# Patient Record
Sex: Female | Born: 1965
Health system: Southern US, Community
[De-identification: ages and names within clinical notes are randomized; demographics above are authoritative.]

## PROBLEM LIST (undated history)

## (undated) DIAGNOSIS — R609 Edema, unspecified: Secondary | ICD-10-CM

## (undated) DIAGNOSIS — M7989 Other specified soft tissue disorders: Secondary | ICD-10-CM

## (undated) DIAGNOSIS — I776 Arteritis, unspecified: Secondary | ICD-10-CM

## (undated) DIAGNOSIS — M2550 Pain in unspecified joint: Secondary | ICD-10-CM

## (undated) DIAGNOSIS — D649 Anemia, unspecified: Secondary | ICD-10-CM

## (undated) DIAGNOSIS — D689 Coagulation defect, unspecified: Secondary | ICD-10-CM

## (undated) DIAGNOSIS — K5900 Constipation, unspecified: Secondary | ICD-10-CM

## (undated) DIAGNOSIS — K760 Fatty (change of) liver, not elsewhere classified: Secondary | ICD-10-CM

## (undated) DIAGNOSIS — M549 Dorsalgia, unspecified: Secondary | ICD-10-CM

## (undated) DIAGNOSIS — IMO0002 Reserved for concepts with insufficient information to code with codable children: Secondary | ICD-10-CM

## (undated) DIAGNOSIS — Z860100 Personal history of colon polyps, unspecified: Secondary | ICD-10-CM

## (undated) DIAGNOSIS — E669 Obesity, unspecified: Secondary | ICD-10-CM

## (undated) DIAGNOSIS — R0602 Shortness of breath: Secondary | ICD-10-CM

## (undated) DIAGNOSIS — R51 Headache: Secondary | ICD-10-CM

## (undated) DIAGNOSIS — I10 Essential (primary) hypertension: Secondary | ICD-10-CM

## (undated) DIAGNOSIS — K829 Disease of gallbladder, unspecified: Secondary | ICD-10-CM

## (undated) DIAGNOSIS — G459 Transient cerebral ischemic attack, unspecified: Secondary | ICD-10-CM

## (undated) DIAGNOSIS — I872 Venous insufficiency (chronic) (peripheral): Secondary | ICD-10-CM

## (undated) DIAGNOSIS — E559 Vitamin D deficiency, unspecified: Secondary | ICD-10-CM

## (undated) DIAGNOSIS — E119 Type 2 diabetes mellitus without complications: Secondary | ICD-10-CM

## (undated) DIAGNOSIS — M1990 Unspecified osteoarthritis, unspecified site: Secondary | ICD-10-CM

## (undated) HISTORY — DX: Personal history of colon polyps, unspecified: Z86.0100

## (undated) HISTORY — DX: Arteritis, unspecified: I77.6

## (undated) HISTORY — DX: Unspecified osteoarthritis, unspecified site: M19.90

## (undated) HISTORY — DX: Other specified soft tissue disorders: M79.89

## (undated) HISTORY — DX: Fatty (change of) liver, not elsewhere classified: K76.0

## (undated) HISTORY — DX: Disease of gallbladder, unspecified: K82.9

## (undated) HISTORY — DX: Obesity, unspecified: E66.9

## (undated) HISTORY — DX: Personal history of colonic polyps: Z86.010

## (undated) HISTORY — DX: Constipation, unspecified: K59.00

## (undated) HISTORY — PX: ABDOMINAL HYSTERECTOMY: SHX81

## (undated) HISTORY — DX: Edema, unspecified: R60.9

## (undated) HISTORY — DX: Pain in unspecified joint: M25.50

## (undated) HISTORY — DX: Headache: R51

## (undated) HISTORY — DX: Anemia, unspecified: D64.9

## (undated) HISTORY — DX: Shortness of breath: R06.02

## (undated) HISTORY — DX: Essential (primary) hypertension: I10

## (undated) HISTORY — DX: Transient cerebral ischemic attack, unspecified: G45.9

## (undated) HISTORY — DX: Dorsalgia, unspecified: M54.9

## (undated) HISTORY — DX: Vitamin D deficiency, unspecified: E55.9

## (undated) HISTORY — DX: Coagulation defect, unspecified: D68.9

---

## 1999-05-26 ENCOUNTER — Other Ambulatory Visit: Admission: RE | Admit: 1999-05-26 | Discharge: 1999-05-26 | Payer: Self-pay | Admitting: Obstetrics and Gynecology

## 2001-03-15 ENCOUNTER — Ambulatory Visit (HOSPITAL_COMMUNITY): Admission: RE | Admit: 2001-03-15 | Discharge: 2001-03-15 | Payer: Self-pay | Admitting: Obstetrics and Gynecology

## 2001-03-15 ENCOUNTER — Encounter: Payer: Self-pay | Admitting: Obstetrics and Gynecology

## 2001-05-13 ENCOUNTER — Ambulatory Visit (HOSPITAL_COMMUNITY): Admission: RE | Admit: 2001-05-13 | Discharge: 2001-05-13 | Payer: Self-pay | Admitting: Obstetrics and Gynecology

## 2001-05-13 ENCOUNTER — Encounter: Payer: Self-pay | Admitting: Obstetrics and Gynecology

## 2001-06-06 ENCOUNTER — Other Ambulatory Visit: Admission: RE | Admit: 2001-06-06 | Discharge: 2001-06-06 | Payer: Self-pay | Admitting: Obstetrics and Gynecology

## 2002-06-01 ENCOUNTER — Ambulatory Visit (HOSPITAL_COMMUNITY): Admission: RE | Admit: 2002-06-01 | Discharge: 2002-06-01 | Payer: Self-pay | Admitting: *Deleted

## 2002-06-12 ENCOUNTER — Other Ambulatory Visit: Admission: RE | Admit: 2002-06-12 | Discharge: 2002-06-12 | Payer: Self-pay | Admitting: Obstetrics and Gynecology

## 2002-09-01 ENCOUNTER — Encounter (INDEPENDENT_AMBULATORY_CARE_PROVIDER_SITE_OTHER): Payer: Self-pay

## 2002-09-01 ENCOUNTER — Ambulatory Visit (HOSPITAL_COMMUNITY): Admission: RE | Admit: 2002-09-01 | Discharge: 2002-09-01 | Payer: Self-pay | Admitting: Obstetrics and Gynecology

## 2003-05-28 ENCOUNTER — Encounter (INDEPENDENT_AMBULATORY_CARE_PROVIDER_SITE_OTHER): Payer: Self-pay | Admitting: Specialist

## 2003-05-28 ENCOUNTER — Observation Stay (HOSPITAL_COMMUNITY): Admission: RE | Admit: 2003-05-28 | Discharge: 2003-05-29 | Payer: Self-pay | Admitting: Obstetrics and Gynecology

## 2003-07-22 ENCOUNTER — Emergency Department (HOSPITAL_COMMUNITY): Admission: EM | Admit: 2003-07-22 | Discharge: 2003-07-22 | Payer: Self-pay

## 2003-09-17 ENCOUNTER — Other Ambulatory Visit: Admission: RE | Admit: 2003-09-17 | Discharge: 2003-09-17 | Payer: Self-pay | Admitting: Obstetrics and Gynecology

## 2004-10-17 ENCOUNTER — Other Ambulatory Visit: Admission: RE | Admit: 2004-10-17 | Discharge: 2004-10-17 | Payer: Self-pay | Admitting: Obstetrics and Gynecology

## 2005-04-13 DIAGNOSIS — G459 Transient cerebral ischemic attack, unspecified: Secondary | ICD-10-CM

## 2005-04-13 HISTORY — DX: Transient cerebral ischemic attack, unspecified: G45.9

## 2005-04-20 ENCOUNTER — Ambulatory Visit (HOSPITAL_COMMUNITY): Admission: RE | Admit: 2005-04-20 | Discharge: 2005-04-20 | Payer: Self-pay | Admitting: Oncology

## 2006-05-31 ENCOUNTER — Emergency Department (HOSPITAL_COMMUNITY): Admission: EM | Admit: 2006-05-31 | Discharge: 2006-05-31 | Payer: Self-pay | Admitting: Family Medicine

## 2006-11-02 ENCOUNTER — Ambulatory Visit: Payer: Self-pay | Admitting: Internal Medicine

## 2006-11-02 ENCOUNTER — Inpatient Hospital Stay (HOSPITAL_COMMUNITY): Admission: EM | Admit: 2006-11-02 | Discharge: 2006-11-03 | Payer: Self-pay | Admitting: Emergency Medicine

## 2006-11-03 ENCOUNTER — Encounter (INDEPENDENT_AMBULATORY_CARE_PROVIDER_SITE_OTHER): Payer: Self-pay | Admitting: Pediatrics

## 2007-05-25 ENCOUNTER — Inpatient Hospital Stay (HOSPITAL_COMMUNITY): Admission: EM | Admit: 2007-05-25 | Discharge: 2007-05-27 | Payer: Self-pay | Admitting: Emergency Medicine

## 2007-05-26 ENCOUNTER — Encounter (INDEPENDENT_AMBULATORY_CARE_PROVIDER_SITE_OTHER): Payer: Self-pay | Admitting: Neurology

## 2007-05-27 ENCOUNTER — Encounter (INDEPENDENT_AMBULATORY_CARE_PROVIDER_SITE_OTHER): Payer: Self-pay | Admitting: Cardiology

## 2008-01-20 ENCOUNTER — Emergency Department (HOSPITAL_COMMUNITY): Admission: EM | Admit: 2008-01-20 | Discharge: 2008-01-20 | Payer: Self-pay | Admitting: Emergency Medicine

## 2008-06-26 ENCOUNTER — Encounter (INDEPENDENT_AMBULATORY_CARE_PROVIDER_SITE_OTHER): Payer: Self-pay | Admitting: Emergency Medicine

## 2008-06-26 ENCOUNTER — Ambulatory Visit: Payer: Self-pay | Admitting: Surgery

## 2008-06-26 ENCOUNTER — Emergency Department (HOSPITAL_COMMUNITY): Admission: EM | Admit: 2008-06-26 | Discharge: 2008-06-26 | Payer: Self-pay | Admitting: Emergency Medicine

## 2008-08-01 LAB — HM MAMMOGRAPHY: HM Mammogram: NORMAL

## 2009-02-27 ENCOUNTER — Ambulatory Visit: Payer: Self-pay | Admitting: Internal Medicine

## 2009-02-27 DIAGNOSIS — M300 Polyarteritis nodosa: Secondary | ICD-10-CM

## 2009-02-27 DIAGNOSIS — M1990 Unspecified osteoarthritis, unspecified site: Secondary | ICD-10-CM

## 2009-02-27 DIAGNOSIS — Z860100 Personal history of colon polyps, unspecified: Secondary | ICD-10-CM | POA: Insufficient documentation

## 2009-02-27 DIAGNOSIS — Z8679 Personal history of other diseases of the circulatory system: Secondary | ICD-10-CM | POA: Insufficient documentation

## 2009-02-27 DIAGNOSIS — R519 Headache, unspecified: Secondary | ICD-10-CM | POA: Insufficient documentation

## 2009-02-27 DIAGNOSIS — R51 Headache: Secondary | ICD-10-CM | POA: Insufficient documentation

## 2009-02-27 DIAGNOSIS — I10 Essential (primary) hypertension: Secondary | ICD-10-CM | POA: Insufficient documentation

## 2009-03-14 ENCOUNTER — Encounter: Payer: Self-pay | Admitting: Internal Medicine

## 2009-03-20 ENCOUNTER — Ambulatory Visit: Payer: Self-pay | Admitting: Internal Medicine

## 2009-03-20 DIAGNOSIS — I776 Arteritis, unspecified: Secondary | ICD-10-CM

## 2009-03-20 LAB — CONVERTED CEMR LAB
ALT: 22 units/L (ref 0–35)
Albumin: 3.5 g/dL (ref 3.5–5.2)
Angiotensin 1 Converting Enzyme: 45 units/L (ref 9–67)
Anti Nuclear Antibody(ANA): NEGATIVE
Bilirubin, Direct: 0 mg/dL (ref 0.0–0.3)
Calcium: 9.1 mg/dL (ref 8.4–10.5)
Creatinine, Ser: 0.9 mg/dL (ref 0.4–1.2)
Glucose, Bld: 93 mg/dL (ref 70–99)
HCT: 34.6 % — ABNORMAL LOW (ref 36.0–46.0)
Leukocytes, UA: NEGATIVE
Lymphocytes Relative: 21.2 % (ref 12.0–46.0)
Lymphs Abs: 1.7 10*3/uL (ref 0.7–4.0)
MCV: 80.8 fL (ref 78.0–100.0)
Neutro Abs: 5.8 10*3/uL (ref 1.4–7.7)
Neutrophils Relative %: 70.2 % (ref 43.0–77.0)
Platelets: 248 10*3/uL (ref 150.0–400.0)
Potassium: 3.6 meq/L (ref 3.5–5.1)
RBC: 4.29 M/uL (ref 3.87–5.11)
RDW: 16 % — ABNORMAL HIGH (ref 11.5–14.6)
Specific Gravity, Urine: 1.025 (ref 1.000–1.030)
Total Bilirubin: 0.5 mg/dL (ref 0.3–1.2)
Total Protein, Urine: NEGATIVE mg/dL
Total Protein: 7.6 g/dL (ref 6.0–8.3)
Urine Glucose: NEGATIVE mg/dL
WBC: 8.2 10*3/uL (ref 4.5–10.5)
pH: 5.5 (ref 5.0–8.0)

## 2009-03-25 ENCOUNTER — Encounter: Payer: Self-pay | Admitting: Internal Medicine

## 2009-03-29 ENCOUNTER — Encounter: Payer: Self-pay | Admitting: Internal Medicine

## 2009-04-19 ENCOUNTER — Ambulatory Visit: Payer: Self-pay | Admitting: Internal Medicine

## 2009-05-03 ENCOUNTER — Ambulatory Visit: Payer: Self-pay | Admitting: Internal Medicine

## 2009-05-03 DIAGNOSIS — G4700 Insomnia, unspecified: Secondary | ICD-10-CM

## 2009-05-10 ENCOUNTER — Ambulatory Visit: Payer: Self-pay | Admitting: Internal Medicine

## 2009-05-23 ENCOUNTER — Ambulatory Visit: Payer: Self-pay | Admitting: Internal Medicine

## 2009-05-31 ENCOUNTER — Emergency Department (HOSPITAL_COMMUNITY): Admission: EM | Admit: 2009-05-31 | Discharge: 2009-05-31 | Payer: Self-pay | Admitting: Emergency Medicine

## 2009-05-31 ENCOUNTER — Telehealth: Payer: Self-pay | Admitting: Internal Medicine

## 2009-06-19 ENCOUNTER — Encounter: Payer: Self-pay | Admitting: Internal Medicine

## 2009-06-19 ENCOUNTER — Ambulatory Visit: Payer: Self-pay | Admitting: Internal Medicine

## 2009-06-19 DIAGNOSIS — M545 Low back pain: Secondary | ICD-10-CM | POA: Insufficient documentation

## 2009-07-23 ENCOUNTER — Ambulatory Visit: Payer: Self-pay | Admitting: Internal Medicine

## 2009-09-06 ENCOUNTER — Ambulatory Visit: Payer: Self-pay | Admitting: Internal Medicine

## 2009-09-12 ENCOUNTER — Encounter (INDEPENDENT_AMBULATORY_CARE_PROVIDER_SITE_OTHER): Payer: Self-pay | Admitting: Rheumatology

## 2009-09-12 ENCOUNTER — Ambulatory Visit: Admission: RE | Admit: 2009-09-12 | Discharge: 2009-09-12 | Payer: Self-pay | Admitting: Rheumatology

## 2009-09-12 ENCOUNTER — Encounter: Payer: Self-pay | Admitting: Internal Medicine

## 2009-11-18 ENCOUNTER — Ambulatory Visit: Payer: Self-pay | Admitting: Internal Medicine

## 2010-04-04 ENCOUNTER — Telehealth: Payer: Self-pay | Admitting: Internal Medicine

## 2010-05-13 NOTE — Assessment & Plan Note (Signed)
Summary: FU ON LABS/NWS   Vital Signs:  Patient profile:   45 year old female Height:      62 inches Weight:      253 pounds O2 Sat:      97 % on Room air Temp:     98.7 degrees F oral Pulse rate:   98 / minute Pulse rhythm:   regular Resp:     16 per minute BP sitting:   108 / 78  (left arm) Cuff size:   large  Vitals Entered By: Rock Nephew CMA (May 03, 2009 10:37 AM)  O2 Flow:  Room air CC: follow-up visit//discuss lab   Primary Care Provider:  Yetta Barre  CC:  follow-up visit//discuss lab.  History of Present Illness: She returns for f/up and says that her legs are much better with no pain or swelling. She has hyperpigmented areas where she had PAN.   She c/o's frequent awakenings (insomnia).  Hypertension Follow-Up      This is a 45 year old woman who presents for Hypertension follow-up.  The patient denies lightheadedness, urinary frequency, headaches, edema, impotence, and rash.  The patient denies the following associated symptoms: chest pain, chest pressure, exercise intolerance, dyspnea, palpitations, leg edema, and pedal edema.  Compliance with medications (by patient report) has been near 100%.  The patient reports that dietary compliance has been fair.  The patient reports exercising occasionally.  Adjunctive measures currently used by the patient include salt restriction and relaxation.    Preventive Screening-Counseling & Management  Alcohol-Tobacco     Alcohol drinks/day: 0     Alcohol Counseling: not indicated; patient does not drink     Smoking Status: never  Hep-HIV-STD-Contraception     Hepatitis Risk: no risk noted     HIV Risk: no risk noted     STD Risk: no risk noted     SBE monthly: yes      Sexual History:  currently monogamous.        Drug Use:  no.        Blood Transfusions:  no.    Clinical Review Panels:  Diabetes Management   Creatinine:  0.9 (03/20/2009)   Last Flu Vaccine:  Fluvax 3+ (02/27/2009)  CBC   WBC:  8.2  (03/20/2009)   RBC:  4.29 (03/20/2009)   Hgb:  11.5 (03/20/2009)   Hct:  34.6 (03/20/2009)   Platelets:  248.0 (03/20/2009)   MCV  80.8 (03/20/2009)   MCHC  33.3 (03/20/2009)   RDW  16.0 (03/20/2009)   PMN:  70.2 (03/20/2009)   Lymphs:  21.2 (03/20/2009)   Monos:  5.5 (03/20/2009)   Eosinophils:  2.7 (03/20/2009)   Basophil:  0.4 (03/20/2009)  Complete Metabolic Panel   Glucose:  93 (03/20/2009)   Sodium:  139 (03/20/2009)   Potassium:  3.6 (03/20/2009)   Chloride:  105 (03/20/2009)   CO2:  27 (03/20/2009)   BUN:  11 (03/20/2009)   Creatinine:  0.9 (03/20/2009)   Albumin:  3.5 (03/20/2009)   Total Protein:  7.6 (03/20/2009)   Calcium:  9.1 (03/20/2009)   Total Bili:  0.5 (03/20/2009)   Alk Phos:  212 (03/20/2009)   SGPT (ALT):  22 (03/20/2009)   SGOT (AST):  21 (03/20/2009)   Medications Prior to Update: 1)  Benicar Hct 40-12.5 Mg Tabs (Olmesartan Medoxomil-Hctz) .... Once Daily For High Blood Pressure  Current Medications (verified): 1)  Benicar Hct 40-12.5 Mg Tabs (Olmesartan Medoxomil-Hctz) .... Once Daily For High Blood Pressure 2)  Prednisone  10 Mg Tabs (Prednisone) .... Take 1 Tablet By Mouth Once A Day 3)  Silenor 6 Mg Tabs (Doxepin Hcl) .... One By Mouth At Bedtime For Insomnia  Allergies (verified): No Known Drug Allergies  Past History:  Past Medical History: Reviewed history from 02/27/2009 and no changes required. Colonic polyps, hx of Headache Hypertension Osteoarthritis Transient ischemic attack, hx of  Past Surgical History: Reviewed history from 02/27/2009 and no changes required. Hysterectomy  Family History: Reviewed history from 02/27/2009 and no changes required. Family History of Arthritis Family History Hypertension Family History of Prostate CA 1st degree relative <50 Family History of Stroke F 1st degree relative <60  Social History: Reviewed history from 02/27/2009 and no changes required. 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  The patient denies anorexia, peripheral edema, prolonged cough, abdominal pain, hematuria, suspicious skin lesions, enlarged lymph nodes, angioedema, and depression.   MS:  Denies joint pain, joint redness, joint swelling, low back pain, muscle aches, cramps, and muscle weakness. Derm:  Complains of changes in color of skin; denies flushing, itching, lesion(s), and rash.  Physical Exam  General:  alert, well-developed, well-nourished, well-hydrated, appropriate dress, normal appearance, healthy-appearing, cooperative to examination, good hygiene, and overweight-appearing.   Mouth:  Oral mucosa and oropharynx without lesions or exudates.  Teeth in good repair. Neck:  supple, full ROM, no masses, no thyromegaly, no JVD, normal carotid upstroke, no carotid bruits, no cervical lymphadenopathy, and no neck tenderness.   Lungs:  normal respiratory effort, no intercostal retractions, no accessory muscle use, and normal breath sounds.   Heart:  normal rate, regular rhythm, no murmur, no gallop, and no JVD.   Abdomen:  soft, non-tender, normal bowel sounds, no distention, no masses, no guarding, no rigidity, no hepatomegaly, and no splenomegaly.   Msk:  normal ROM, no joint tenderness, no joint swelling, no redness over joints, and no joint deformities.   Extremities:  she has PIPA but no other lesions Skin:  turgor normal, PIPA over both shins,  no rashes, no suspicious lesions, no ecchymoses, no petechiae, no purpura, no ulcerations, and no edema.   Cervical Nodes:  no anterior cervical adenopathy and no posterior cervical adenopathy.   Psych:  Cognition and judgment appear intact. Alert and cooperative with normal attention span and concentration. No apparent delusions, illusions, hallucinations   Impression & Recommendations:  Problem # 1:  INSOMNIA-SLEEP DISORDER-UNSPEC  (ICD-780.52) Assessment New  Her updated medication list for this problem includes:    Silenor 6 Mg Tabs (Doxepin hcl) ..... One by mouth at bedtime for insomnia  Discussed sleep hygiene.   Problem # 2:  VASCULITIS (ICD-447.6) Assessment: Improved continue prednisone and Rheum. f/up.  Problem # 3:  HYPERTENSION (ICD-401.9) Assessment: Improved  Her updated medication list for this problem includes:    Benicar Hct 40-12.5 Mg Tabs (Olmesartan medoxomil-hctz) ..... Once daily for high blood pressure  BP today: 108/78 Prior BP: 136/80 (03/20/2009)  Prior 10 Yr Risk Heart Disease: Not enough information (02/27/2009)  Labs Reviewed: K+: 3.6 (03/20/2009) Creat: : 0.9 (03/20/2009)     Complete Medication List: 1)  Benicar Hct 40-12.5 Mg Tabs (Olmesartan medoxomil-hctz) .... Once daily for high blood pressure 2)  Prednisone 10 Mg Tabs (Prednisone) .... Take 1 tablet by mouth once a day 3)  Silenor 6 Mg Tabs (Doxepin hcl) .... One by mouth at bedtime for insomnia  Patient  Instructions: 1)  Please schedule a follow-up appointment in 4 months. 2)  It is important that you exercise regularly at least 20 minutes 5 times a week. If you develop chest pain, have severe difficulty breathing, or feel very tired , stop exercising immediately and seek medical attention. 3)  You need to lose weight. Consider a lower calorie diet and regular exercise.  4)  Check your Blood Pressure regularly. If it is above 130/80: you should make an appointment. Prescriptions: BENICAR HCT 40-12.5 MG TABS (OLMESARTAN MEDOXOMIL-HCTZ) once daily for high blood pressure  #30 x 11   Entered and Authorized by:   Etta Grandchild MD   Signed by:   Etta Grandchild MD on 05/03/2009   Method used:   Electronically to        [facility] [id number]* (retail)       [address] [place], [place]  [address]       Ph: [phone]       Fax: [phone]   RxID:   [id number] SILENOR 6 MG TABS (DOXEPIN HCL)  One by mouth at bedtime for insomnia  #40 x 0   Entered and Authorized by:   Etta Grandchild MD   Signed by:   Etta Grandchild MD on 05/03/2009   Method used:   Samples Given   RxID:   [id number]

## 2010-05-13 NOTE — Assessment & Plan Note (Signed)
Summary: BACK PAIN, DIFFICULTY WALKING-LB   Vital Signs:  Patient profile:   45 year old female Height:      62 inches Weight:      259 pounds BMI:     47.54 O2 Sat:      98 % on Room air Temp:     97.8 degrees F oral Pulse rate:   92 / minute Pulse rhythm:   regular Resp:     16 per minute BP sitting:   144 / 90  (left arm) Cuff size:   large  Vitals Entered By: Rock Nephew CMA (June 19, 2009 3:30 PM)  O2 Flow:  Room air CC: Low back pain that radiates down lower extremities x 06/16/2009, Back pain, Hypertension Management Is Patient Diabetic? No Pain Assessment Patient in pain? yes     Location: lower back Intensity: 10 Type: burning Onset of pain  Constant   Primary Care Provider:  Yetta Barre  CC:  Low back pain that radiates down lower extremities x 06/16/2009, Back pain, and Hypertension Management.  History of Present Illness:  Back Pain      This is a 45 year old woman who presents with Back pain.  The symptoms began 4 days ago.  The intensity is described as moderate-severe.  The patient reports rest pain, but denies fever, chills, weakness, loss of sensation, fecal incontinence, urinary incontinence, urinary retention, dysuria, inability to work, and inability to care for self.  The pain is located in the mid low back.  The pain began at home, suddenly, and after straining.  The pain radiates to the right buttock.  The pain is made worse by standing or walking.  The pain is made better by inactivity.  Risk factors for serious underlying conditions include corticosteroid use.    Hypertension History:      She denies headache, chest pain, palpitations, dyspnea with exertion, orthopnea, PND, peripheral edema, visual symptoms, neurologic problems, syncope, and side effects from treatment.        Positive major cardiovascular risk factors include hypertension.  Negative major cardiovascular risk factors include female age less than 53 years old, no history of diabetes or  hyperlipidemia, negative family history for ischemic heart disease, and non-tobacco-user status.        Positive history for target organ damage include prior stroke (or TIA).  Further assessment for target organ damage reveals no history of ASHD, cardiac end-organ damage (CHF/LVH), peripheral vascular disease, renal insufficiency, or hypertensive retinopathy.     Preventive Screening-Counseling & Management  Alcohol-Tobacco     Alcohol drinks/day: 0     Alcohol Counseling: not indicated; patient does not drink     Smoking Status: never  Hep-HIV-STD-Contraception     Hepatitis Risk: no risk noted     HIV Risk: no risk noted     STD Risk: no risk noted     SBE monthly: yes      Sexual History:  currently monogamous.        Drug Use:  no.        Blood Transfusions:  no.    Medications Prior to Update: 1)  Benicar Hct 40-12.5 Mg Tabs (Olmesartan Medoxomil-Hctz) .... Once Daily For High Blood Pressure 2)  Prednisone 10 Mg Tabs (Prednisone) .... Take 3  Tablet By Mouth Once A Day 3)  Silenor 6 Mg Tabs (Doxepin Hcl) .... One By Mouth At Bedtime For Insomnia  Current Medications (verified): 1)  Benicar Hct 40-12.5 Mg Tabs (Olmesartan Medoxomil-Hctz) .Marland KitchenMarland KitchenMarland Kitchen  Once Daily For High Blood Pressure 2)  Prednisone 10 Mg Tabs (Prednisone) .... Take 3  Tablet By Mouth Once A Day 3)  Silenor 6 Mg Tabs (Doxepin Hcl) .... One By Mouth At Bedtime For Insomnia 4)  Nucynta 100 Mg Tabs (Tapentadol Hcl) .... One By Mouth Qid As Needed For Low Back Pain  Allergies (verified): No Known Drug Allergies  Past History:  Past Medical History: Reviewed history from 02/27/2009 and no changes required. Colonic polyps, hx of Headache Hypertension Osteoarthritis Transient ischemic attack, hx of  Past Surgical History: Reviewed history from 02/27/2009 and no changes required. Hysterectomy  Family History: Reviewed history from 02/27/2009 and no changes required. Family History of Arthritis Family  History Hypertension Family History of Prostate CA 1st degree relative <50 Family History of Stroke F 1st degree relative <60  Social History: Reviewed history from 02/27/2009 and no changes required. 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  The patient denies anorexia, fever, weight loss, chest pain, syncope, dyspnea on exertion, peripheral edema, prolonged cough, headaches, hemoptysis, abdominal pain, hematuria, suspicious skin lesions, difficulty walking, and enlarged lymph nodes.    Physical Exam  General:  alert, well-developed, well-nourished, well-hydrated, appropriate dress, normal appearance, healthy-appearing, cooperative to examination, good hygiene, and overweight-appearing.   Head:  normocephalic and atraumatic.   Eyes:  vision grossly intact, pupils equal, pupils round Mouth:  Oral mucosa and oropharynx without lesions or exudates.  Teeth in good repair. Neck:  supple, full ROM, no masses, no thyromegaly, no JVD, normal carotid upstroke, no carotid bruits, no cervical lymphadenopathy, and no neck tenderness.   Lungs:  normal respiratory effort, no intercostal retractions, no accessory muscle use, and normal breath sounds.   Heart:  normal rate, regular rhythm, no murmur, no gallop, and no JVD.   Abdomen:  soft, non-tender, normal bowel sounds, no distention, no masses, no guarding, no rigidity, no hepatomegaly, and no splenomegaly.   Msk:  normal ROM, no joint tenderness, no joint swelling, no joint warmth, no redness over joints, no joint deformities, no joint instability, and no crepitation.     Detailed Back/Spine Exam  General:    Well-developed, well-nourished, in no acute distress; alert and oriented x 3.    Gait:    Normal heel-toe gait pattern bilaterally.    Lumbosacral Exam:  Inspection-deformity:    Normal Palpation-spinal tenderness:  Abnormal     Location:  L3-L4 Range of Motion:    Forward Flexion:   85 degrees    Hyperextension:   30 degrees    Schober's:        3-6 cm    Right Lateral Bend:   30 degrees    Left Lateral Bend:   30 degrees Lying Straight Leg Raise:    Right:  negative    Left:  negative Sitting Straight Leg Raise:    Right:  negative    Left:  negative Reverse Straight Leg Raise:    Right:  negative    Left:  negative Contralateral Straight Leg Raise:    Right:  negative    Left:  negative Sciatic Notch:    There is no sciatic notch tenderness. Toe Walking:    Right:  normal    Left:  normal Heel Walking:    Right:  normal    Left:  normal   Impression & Recommendations:  Problem # 1:  BACK  PAIN, LUMBAR (ICD-724.2) Assessment New will order films to look for fracture, subluxation Her updated medication list for this problem includes:    Nucynta 100 Mg Tabs (Tapentadol hcl) ..... One by mouth qid as needed for low back pain  Orders: Admin of Therapeutic Inj  intramuscular or subcutaneous ([address]) Ketorolac-Toradol 15mg  (U0454) T-Lumbar Spine Complete, 5 Views (09811BJ)  Discussed use of moist heat or ice, modified activities, medications, and stretching/strengthening exercises. Back care instructions given. To be seen in 2 weeks if no improvement; sooner if worsening of symptoms.   Problem # 2:  HYPERTENSION (ICD-401.9) Assessment: Deteriorated  Her updated medication list for this problem includes:    Benicar Hct 40-12.5 Mg Tabs (Olmesartan medoxomil-hctz) ..... Once daily for high blood pressure  BP today: 144/90 Prior BP: 110/74 (05/10/2009)  Prior 10 Yr Risk Heart Disease: Not enough information (02/27/2009)  Labs Reviewed: K+: 3.6 (03/20/2009) Creat: : 0.9 (03/20/2009)     Complete Medication List: 1)  Benicar Hct 40-12.5 Mg Tabs (Olmesartan medoxomil-hctz) .... Once daily for high blood pressure 2)  Prednisone 10 Mg Tabs (Prednisone) .... Take 3  tablet by mouth once a  day 3)  Silenor 6 Mg Tabs (Doxepin hcl) .... One by mouth at bedtime for insomnia 4)  Nucynta 100 Mg Tabs (Tapentadol hcl) .... One by mouth qid as needed for low back pain  Hypertension Assessment/Plan:      The patient's hypertensive risk group is category C: Target organ damage and/or diabetes.  Today's blood pressure is 144/90.  Her blood pressure goal is < 140/90.  Patient Instructions: 1)  Please schedule a follow-up appointment in 1 month. 2)  Take 650-1000mg  of Tylenol every 4-6 hours as needed for relief of pain or comfort of fever AVOID taking more than 4000mg   in a 24 hour period (can cause liver damage in higher doses). 3)  Take 400-600mg  of Ibuprofen (Advil, Motrin) with food every 4-6 hours as needed for relief of pain or comfort of fever. 4)  Most patients (90%) with low back pain will improve with time (2-6 weeks). Keep active but avoid activities that are painful. Apply moist heat and/or ice to lower back several times a day. Prescriptions: NUCYNTA 100 MG TABS (TAPENTADOL HCL) One by mouth QID as needed for low back pain  #75 x 0   Entered and Authorized by:   Etta Grandchild MD   Signed by:   Etta Grandchild MD on 06/19/2009   Method used:   Print then Give to Patient   RxID:   [id number]     Medication Administration  Injection # 1:    Medication: Ketorolac-Toradol 15mg     Diagnosis: BACK PAIN, LUMBAR (ICD-724.2)    Site: LUOQ gluteus    Exp Date: 08/12/2010    Lot #: [id number]    Mfr: [email or web address]    Comments: pt given 60mg     Patient tolerated injection without complications    Given by: Rock Nephew CMA (June 19, 2009 3:54 PM)  Orders Added: 1)  Admin of Therapeutic Inj  intramuscular or subcutaneous [96372] 2)  Ketorolac-Toradol 15mg  [J1885] 3)  T-Lumbar Spine Complete, 5 Views [71110TC] 4)  Est. Patient Level IV [46962]

## 2010-05-13 NOTE — Letter (Signed)
Summary: Out of Work  [facility]  [address] [place], [place] [address]   Phone: [phone]  Fax: [phone]    June 19, 2009   Employee:  CATERINA RACINE    To Whom It May Concern:   For Medical reasons, please excuse the above named employee from work.   Patient will return to work 06/20/2009  If you need additional information, please feel free to contact our office.         Sincerely,    Alvy Beal A CMA Dr. Sanda Linger

## 2010-05-13 NOTE — Letter (Signed)
Summary: Out of Work  [facility]  [address] [place], [place] [address]   Phone: [phone]  Fax: [phone]    May 10, 2009   Employee:  [name]    To Whom It May Concern:   For Medical reasons, please excuse the above named employee from work for the following dates:  Start:   05/10/09  End:   05/13/09  If you need additional information, please feel free to contact our office.         Sincerely,    Etta Grandchild MD

## 2010-05-13 NOTE — Assessment & Plan Note (Signed)
Summary: LEG/SWOLLEN/KNOT---STC   Vital Signs:  Patient profile:   45 year old female Height:      62 inches Weight:      259 pounds BMI:     47.54 O2 Sat:      97 % on Room air Temp:     98.8 degrees F oral Pulse rate:   90 / minute Pulse rhythm:   regular Resp:     16 per minute BP sitting:   110 / 74  (left arm) Cuff size:   large  Vitals Entered By: Rock Nephew CMA (May 10, 2009 2:29 PM)  Nutrition Counseling: Patient's BMI is greater than 25 and therefore counseled on weight management options.  O2 Flow:  Room air   Primary Care Provider:  Yetta Barre   History of Present Illness: She returns c/o a recurrence of painful, swollen nodules on her LLE. Her Rheum. is not avaliable.  Current Medications (verified): 1)  Benicar Hct 40-12.5 Mg Tabs (Olmesartan Medoxomil-Hctz) .... Once Daily For High Blood Pressure 2)  Prednisone 10 Mg Tabs (Prednisone) .... Take 1 Tablet By Mouth Once A Day 3)  Silenor 6 Mg Tabs (Doxepin Hcl) .... One By Mouth At Bedtime For Insomnia  Allergies (verified): No Known Drug Allergies  Past History:  Past Medical History: Reviewed history from 02/27/2009 and no changes required. Colonic polyps, hx of Headache Hypertension Osteoarthritis Transient ischemic attack, hx of  Past Surgical History: Reviewed history from 02/27/2009 and no changes required. Hysterectomy  Family History: Reviewed history from 02/27/2009 and no changes required. Family History of Arthritis Family History Hypertension Family History of Prostate CA 1st degree relative <50 Family History of Stroke F 1st degree relative <60  Social History: Reviewed history from 02/27/2009 and no changes required. 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    General:  Denies chills, fatigue, fever, loss of appetite, malaise, sweats, and weakness. MS:  Denies joint pain, joint redness, joint swelling, loss of strength, and low back pain.  Physical Exam  General:  alert, well-developed, well-nourished, well-hydrated, appropriate dress, normal appearance, healthy-appearing, cooperative to examination, good hygiene, and overweight-appearing.   Mouth:  Oral mucosa and oropharynx without lesions or exudates.  Teeth in good repair. Neck:  supple, full ROM, no masses, no thyromegaly, no JVD, normal carotid upstroke, no carotid bruits, no cervical lymphadenopathy, and no neck tenderness.   Lungs:  normal respiratory effort, no intercostal retractions, no accessory muscle use, and normal breath sounds.   Heart:  normal rate, regular rhythm, no murmur, no gallop, and no JVD.   Abdomen:  soft, non-tender, normal bowel sounds, no distention, no masses, no guarding, no rigidity, no hepatomegaly, and no splenomegaly.   Msk:  she has one new area on the left shin that feels like a lesion of PAN with 2.0 cm of soft swelling under the skin. there is persistent PIPA with no change. Pulses:  R and L carotid,radial,femoral,dorsalis pedis and posterior tibial pulses are full and equal bilaterally Extremities:  she has PIPA  Neurologic:  No cranial nerve deficits noted. Station and gait are normal. Plantar reflexes are down-going bilaterally. DTRs are symmetrical throughout. Sensory, motor and coordinative functions appear intact.   Impression & Recommendations:  Problem # 1:  POLYARTERITIS NODOSA (ICD-446.0) Assessment Deteriorated increase dose of prednisone  Complete Medication List: 1)  Benicar Hct 40-12.5 Mg Tabs (Olmesartan medoxomil-hctz) .... Once daily for high blood pressure 2)  Prednisone 10 Mg Tabs (Prednisone) .... Take 3  tablet by mouth once a day 3)  Silenor 6 Mg Tabs (Doxepin hcl) .... One by mouth at bedtime for insomnia  Patient  Instructions: 1)  Please schedule a follow-up appointment in 2 weeks.

## 2010-05-13 NOTE — Letter (Signed)
Summary: Vaculitis/Dr.William Nathaneil Canary, M.D.  Vaculitis/Dr.William Nathaneil Canary, M.D.   Imported By: Sherian Rein 04/26/2009 08:02:26  _____________________________________________________________________  External Attachment:    Type:   Image     Comment:   External Document

## 2010-05-13 NOTE — Progress Notes (Signed)
Summary: ER / UC  Phone Note Call from Patient   Summary of Call: Pt c/o right sided chest pain which has been sharp off and on x 2 to 3 days. Some SOB. No fever, cough or respiratory symptoms. She has taken asprin w/no relief today. Advised ER or UC asap. Pt agreed. She has office visit scheduled for Monday for f/u if needed. Initial call taken by: Lamar Sprinkles, CMA,  May 31, 2009 4:32 PM

## 2010-05-15 NOTE — Progress Notes (Signed)
  Prescriptions: BENICAR HCT 40-12.5 MG TABS (OLMESARTAN MEDOXOMIL-HCTZ) once daily for high blood pressure  #30 x 1   Entered by:   Ami Bullins CMA   Authorized by:   Etta Grandchild MD   Signed by:   Bill Salinas CMA on 04/04/2010   Method used:   Samples Given   RxID:   [id number]

## 2010-05-16 NOTE — Letter (Signed)
Summary: Stacey Drain MD  Stacey Drain MD   Imported By: Sherian Rein 09/20/2009 14:35:21  _____________________________________________________________________  External Attachment:    Type:   Image     Comment:   External Document

## 2010-05-19 ENCOUNTER — Telehealth: Payer: Self-pay | Admitting: Internal Medicine

## 2010-05-29 NOTE — Progress Notes (Signed)
Summary: SAMPLES  Phone Note Call from Patient Call back at [phone] Or [phone]   Summary of Call: Patient is requesting samples of BP med - ins does not take affect until May 1 Initial call taken by: Lamar Sprinkles, CMA,  May 19, 2010 1:42 PM  Follow-up for Phone Call        Pt informed  Follow-up by: Lamar Sprinkles, CMA,  May 20, 2010 10:11 AM    Prescriptions: BENICAR HCT 40-12.5 MG TABS (OLMESARTAN MEDOXOMIL-HCTZ) once daily for high blood pressure  #67 x 0   Entered by:   Lamar Sprinkles, CMA   Authorized by:   Etta Grandchild MD   Signed by:   Lamar Sprinkles, CMA on 05/20/2010   Method used:   Samples Given   RxID:   [id number]

## 2010-06-13 ENCOUNTER — Other Ambulatory Visit: Payer: Self-pay

## 2010-07-17 ENCOUNTER — Telehealth: Payer: Self-pay | Admitting: Internal Medicine

## 2010-07-17 ENCOUNTER — Emergency Department (HOSPITAL_BASED_OUTPATIENT_CLINIC_OR_DEPARTMENT_OTHER)
Admission: EM | Admit: 2010-07-17 | Discharge: 2010-07-17 | Disposition: A | Discharge status: Home / Self Care | Payer: Self-pay | Attending: Emergency Medicine | Admitting: Emergency Medicine

## 2010-07-17 DIAGNOSIS — Z8679 Personal history of other diseases of the circulatory system: Secondary | ICD-10-CM | POA: Insufficient documentation

## 2010-07-17 DIAGNOSIS — H8110 Benign paroxysmal vertigo, unspecified ear: Secondary | ICD-10-CM | POA: Insufficient documentation

## 2010-07-17 DIAGNOSIS — I10 Essential (primary) hypertension: Secondary | ICD-10-CM | POA: Insufficient documentation

## 2010-07-17 NOTE — Telephone Encounter (Signed)
Patient called stating that upon taking her bp meds she is still experiencing elevated bp. bp is 156/110 in the am and 170/110 at about 4pm. First reading was before taking meds and the second reading is after per pt. Please Advise.

## 2010-07-17 NOTE — Telephone Encounter (Signed)
Patient informed and agreed to go to [facility] tonight

## 2010-07-17 NOTE — Telephone Encounter (Signed)
She needs to go an [facility] or ER to be seen

## 2010-08-18 ENCOUNTER — Encounter: Payer: Self-pay | Admitting: Internal Medicine

## 2010-08-20 ENCOUNTER — Encounter: Payer: Self-pay | Admitting: Internal Medicine

## 2010-08-20 DIAGNOSIS — Z0289 Encounter for other administrative examinations: Secondary | ICD-10-CM

## 2010-08-26 NOTE — H&P (Signed)
[name], [name]             ACCOUNT NO.:  [id number]   MEDICAL RECORD NO.:  [id number]          PATIENT TYPE:  INP   LOCATION:  3030                         FACILITY:  [facility]   PHYSICIAN:  Deanna Artis. Hickling, M.D.DATE OF BIRTH:  07/30/1965   DATE OF ADMISSION:  11/02/2006  DATE OF DISCHARGE:                              HISTORY & PHYSICAL   CHIEF COMPLAINT:  Left-sided weakness and numbness.   HISTORY OF PRESENT ILLNESS:  [name] is a 45 year old divorced  right-handed African American woman with a 2-year history of  hypertension and morbid obesity and sedentary lifestyle.  Around 12 noon  today, she had onset of left facial numbness and facial droop which  cleared.  EMS was called and in the ambulance transport she was noted to  have left arm and leg drift.  She also complained of her frontal  headache, although she says that it was an occipital headache and quite  sharp in nature making it difficult for her to flex her neck.  Blood  pressure initially 200/100, dropped to 186/102, capillary glucose 87.  The patient has not had prior history of stroke.  Her risk factors for  stroke are as noted above.   PAST MEDICAL HISTORY:  Negative except as noted above.   PAST SURGICAL HISTORY:  Hysterectomy in 2005 for menometrorrhagia  and  dilation and curettage 1 year before.   REVIEW OF SYSTEMS:  NEUROLOGIC:  See history of present illness.  CARDIOVASCULAR:  No chest pain or palpitations.  PULMONARY:  No dyspnea  on exertion or respiratory distress.  GASTROINTESTINAL:  No nausea,  vomiting, diarrhea, constipation.  GENITOURINARY:  No dysuria.  MUSCULOSKELETAL:  No pain.  ENDOCRINE:  No diabetes or thyroid disease.  REPRODUCTIVE:  Hysterectomy as noted above.  HEMATOLOGIC:  No anemia,  bruisability.  ALLERGIES:  No complaints.  NEUROSPYCHIATRIC:  No  complaints.  HEENT:  No otitis media, pharyngitis, sinusitis.  CONSTITUTIONAL:  The patient lost 30 pounds a year so ago and  has  maintained her weight steadily.  She does not have problems with sleep  apnea or significant sleep arousal.   FAMILY HISTORY:  Positive for a fatal stroke in her mother age 96.  Father has hypertension, is alive and well.   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  She has three children.  She is engaged to be married and  lives with her fiance, her three children and his child.   CURRENT MEDICATIONS:  Benicar 25 mg daily.  She does not have a regular  physician and has gotten her prescriptions through [facility].   DRUG ALLERGIES:  None known.   PHYSICAL EXAMINATION:  VITAL SIGNS:  Temperature 98, blood pressure  171/89, resting pulse 80, respirations 14, oxygen saturation 100%,  height 5 feet 2 inches, weight 250 pounds.  HEENT:  No signs of infection.  NECK:  Supple neck.  Full range of motion.  She has some pain in her  right craniocervical junction.  She does not have meningismus.  There  are no  bruits.  LUNGS:  Clear to auscultation.  HEART:  No murmurs.  Pulses normal.  ABDOMEN:  Soft, protuberant.  Bowel sounds normal.  No  hepatosplenomegaly.  EXTREMITIES:  Well-formed without edema, cyanosis, alterations in tone  or tight heel cords.  SKIN:  No lesions.  Vascular tone was normal.  NEUROLOGIC:  Examination of mental status:  The patient is alert without  dysphasia or dyspraxia.  Cranial nerves: Round, reactive pupils.  Normal  fundi.  Full visual fields to double simultaneous stimuli.  Extraocular  movements full and conjugate.  Symmetric facial strength.  The patient  has some numbness in the left face that seems to split the midline.  Motor examination:  Normal strength except for the left lower extremity  which is 4/5 proximally, 5/5 distally.  No signs of drift.  Fine motor  movements were normal.  Sensation: Hypesthesia in the left face and left  leg.  The patient has normal sensation in the arm and  trunk.  She has  good stereoagnosis bilaterally.   Her NIH stroke scale was estimated at 2 by my nurse 15 minutes before me  and I repeated the study and it was 1 with 1 off for sensation.  Modified Rankin score 0.  He will laboratories at this time that are  available or CT scan of the brain which I have reviewed at 1315 hours  and was normal.  CBC:  White count 7700, hemoglobin 12.9, hematocrit  38.5, platelet count 249,000, 52 polys, 37 lymphs, 7 monos, 3  eosinophils.  PT 13.3, INR 1,  PTT 33.   IMPRESSION:  1. Right brain completed, nonhemorrhagic infarction.  This could      involve the internal capsule or the upper brain stem.  There is no      crossed sensation status to suggest the lower brain stem.  434.01.  2. Hypertension, poorly controlled.  404.10.  3. Morbid obesity. 278.01.   PLAN:  We are going to admit the patient for evaluation.  She will have  an MRI scan of the brain, MRA intracranial, noninvasive vascular workup  including 2-D echocardiogram, carotid Dopplers, and transcranial  Dopplers and blood tests including fasting lipid profile, fasting  homocystine, and hemoglobin A1C.  We will also check urine drug screen  although I think it will be unremarkable.  We await the results of the  rest of labs drawn and pending at this time.  She will be treated with  Lovenox for DVT prophylaxis.  We will check a swallowing study to make  certain that she does not have dysphagia and treat her with 325 mg of  aspirin.  At present we will treat her blood pressure conservatively but  we are prepared to transfer to an ICU for a drip if her blood pressure  begins to increase to ranges where it was on initial evaluation by EMS.   I appreciate the opportunity to participate in her care.      Deanna Artis. Sharene Skeans, M.D.  Electronically Signed     [facility]/MEDQ  D:  11/02/2006  T:  11/03/2006  Job:  [id number]

## 2010-08-26 NOTE — Discharge Summary (Signed)
[name], [name]             ACCOUNT NO.:  [id number]   MEDICAL RECORD NO.:  [id number]          PATIENT TYPE:  INP   LOCATION:  3004                         FACILITY:  [facility]   PHYSICIAN:  Pramod P. Pearlean Brownie, MD    DATE OF BIRTH:  08-10-1965   DATE OF ADMISSION:  05/25/2007  DATE OF DISCHARGE:                               DISCHARGE SUMMARY   DIAGNOSES AT TIME OF DISCHARGE:  1. Left brain transient ischemic attack, symptoms resolved in setting      of a history of previous transient ischemic attack 1 year ago  2. Obesity.  3. Hypertension.  4. History of headaches, questionable migraine.  5. Hysterectomy.  6. History of dilatation and curettage.   MEDICINES AT TIME OF DISCHARGE:  1. Benicar 20 mg a day.  2. Diethylpropion 75 mg a day  3. Plavix 75 mg a day.   STUDIES PERFORMED:  1. CT of the brain on admission shows no acute abnormality.  2. MRI of the brain:  No acute infarct or mass lesion.  White matter      has normal signal.  Ventricles are normal.  3. MRA of the neck:  Negative.  4. MRA of the head:  Quality degraded by motion.  Some decrease signal      in the terminal internal carotid artery on the left, which is      probably artifactual.  5. Two-dimensional echocardiogram shows ejection fraction of 60% to      65% with no source of embolus.  6. Carotid Doppler not performed.  7. EKG shows normal sinus rhythm.   LABORATORY STUDIES:  CBC normal.  Chemistry with potassium of 3.4,  otherwise normal.  Coagulation studies normal.  Liver function tests  with alkaline phosphatase 157, otherwise normal.  Cardiac enzymes  negative.  Cholesterol 131, triglycerides 60, HDL 38, LDL 81.  Urinalysis was negative.  ANA pending.  CH50 pending.  Sed rate 28.  Homocystine 6.4.  Complement C3 192, C4 28.  Urine drug screen negative.  Hemoglobin A1c 6.2 and hypercoagulable panel pending.   HISTORY OF PRESENT ILLNESS:  [name] is a 45 year old right-  handed African  American female with a history of hypertension.  The  patient was admitted to [facility] in the summer of 2008 with a  presumed TIA event affecting the left body.  The patient had a workup at  that time which included a CT of the head, MRI of the brain and MR  angiogram of the intracranial vessels, carotid Doppler and 2-D  echocardiogram, all which were unremarkable.  The patient was placed on  low-dose aspirin and has done well until now.  The patient has no  problems with headaches since her blood pressure has been controlled.  She is currently being followed by Dr. Jonny Ruiz at [facility].  She presents today noting onset of deficits while at work around 11 a.m.  She noticed some numbness and heaviness of her right arm, difficulty  with speech and slurring of speech, but also the patient was leaving out  words such as a conjunctives  in a sentence with a telegraphic speech  pattern.  This gradually improved over the next hour or so and the  patient is almost back to normal with only some residual tingling of the  right arm.  CT of the brain was unremarkable.  The patient was not a tPA  candidate secondary to quick resolution of symptoms.  She was admitted  to the hospital for further stroke evaluation.   HOSPITAL COURSE:  MRI of the brain was negative for acute stroke, though  with recurrent symptoms of stroke, it was concerning for possible  embolic source.  Transesophageal echocardiogram was performed during her  hospitalization, which shows no acute abnormality.  Plans are to  discharge her home as she has no therapy needs.  Her aspirin was changed  to Plavix for stroke prevention and she will be scheduled for an  outpatient bubble and TCD monitoring with Dr. Pearlean Brownie.  She is also a  possible candidate for the IRIS trial.   CONDITION AT DISCHARGE:  The patient is alert and oriented x3, in no  acute distress, normal speech.  Chest: Clear to auscultation.  Heart  rate  regular.  Extraocular movements are full.  She has face, tongue and  palate which were symmetric.  She has normal strength in all 4  extremities and her gait is steady.   DISCHARGE PLAN:  1. Discharge home.  2. Stop aspirin.  Plavix for stroke prevention.  3. Outpatient bubble study and TCD monitoring with Dr. Pearlean Brownie within      the next 1 month, patient to call for appointment.  4. Consider IRIS trial.  5. Follow up with primary care physician within 1 month.      Annie Main, N.P.    ______________________________  Sunny Schlein. Pearlean Brownie, MD    SB/MEDQ  D:  05/27/2007  T:  05/30/2007  Job:  [id number]   cc:   Dr. Jonny Ruiz [facility]

## 2010-08-26 NOTE — Discharge Summary (Signed)
[name], [name] [name]             ACCOUNT NO.:  [id number]   MEDICAL RECORD NO.:  [id number]          PATIENT TYPE:  INP   LOCATION:  3030                         FACILITY:  [facility]   PHYSICIAN:  Pramod P. Pearlean Brownie, MD    DATE OF BIRTH:  1966/02/17   DATE OF ADMISSION:  11/02/2006  DATE OF DISCHARGE:  11/03/2006                               DISCHARGE SUMMARY   DIAGNOSES AT TIME OF DISCHARGE:  1. Transient ischemic attack versus migraine with mild left leg      weakness.  2. Hypertension.  3. Morbid obesity.   MEDICINES AT TIME OF DISCHARGE:  1. Benicar 25 mg a day.  2. Aspirin 325 mg a day.   STUDIES PERFORMED.:  1. CT of the brain on admission showed no acute abnormality.  2. MRI of the brain showed no acute stroke.  3. MRA of the brain was degraded, but no significant stenosis of major      vessel.  4. Carotid Doppler showed no ICA stenosis.  5. Two-dimensional echocardiogram was performed, results pending.  6. Transcranial Doppler performed, results pending.  7. EKG shows normal sinus rhythm.   LABORATORY STUDIES:  CBC normal.  Chemistry normal.  Coagulation studies  normal.  Liver function with alkaline phosphatase 191, otherwise normal.  Albumin 3.7.  Cardiac enzymes negative.  Cholesterol 147, triglycerides  86, HDL 46, LDL 84.  Urinalysis is negative.  Homocysteine pending.  Hemoglobin A1c 6.0.  Urine drug screen negative.   HISTORY OF PRESENT ILLNESS:  [name] [name] is a 45 year old right-  handed African American female who had onset at noon of left facial  numbness and left lower extremity tingling.  She has a 2-year history of  hypertension and morbid obesity and sedentary lifestyle.  Around noon,  she had onset of left facial numbness and left facial droop which  cleared.  EMS was called, ambulance transported her here, where she was  noted to have left arm and left leg drift.  She also complains of left  frontal headache, although she says it was an  occipital headache and  quite sharp in nature, making it difficult for her to flex her neck.  Blood pressure was initially 200/100, then dropped to 186/102, capillary  glucose of 87.  She has no prior history of stroke.  She does have risk  factors for stroke.  She was admitted to the hospital for further  evaluation.  She was not a TPA candidate secondary to quick resolution.   HOSPITAL COURSE:  MRI was negative for acute stroke.  She did have  symptoms of a headache prior to neurologic symptoms, making this a  likely migraine variant.  Diagnosis at discharge is a transient ischemic  attack versus migraine.  In the hospital, she maintained to have some  left lower extremity tingling and possible weakness.  She was assessed  by physical therapy and outpatient therapy was recommended with a cane  if the patient desired; this has been arranged.  The patient was placed  on aspirin for stroke prevention, though she has multiple risk factors  and TIA may increased  risk of stroke.   CONDITION ON DISCHARGE:  The patient alert and oriented x3.  No aphasia  or dysarthria.  Eye movements are full.  Face is symmetric.  She has no  upper or lower extremity drift.  She has no sensory loss.  She has  subjective decreased sensation in her left leg.   DISCHARGE PLAN:  1. Discharge home with family.  2. Aspirin for stroke prevention.  3. Continue hypertensive medicines.  4. Find a primary care physician and see within 1 month for risk      factor control.  5. Outpatient physical therapy should ambulation continue to be a      problem the week after discharge.  6. May return to work November 08, 2006.   FOLLOWUP:  Outpatient, Dr. Pearlean Brownie, in 2-3 months.      Annie Main, N.P.    ______________________________  Sunny Schlein. Pearlean Brownie, MD    SB/MEDQ  D:  11/03/2006  T:  11/04/2006  Job:  [id number]   cc:   Jackie Plum, M.D.

## 2010-08-26 NOTE — H&P (Signed)
NAMETAUNJA, BRICKNER NO.:  [id number]   MEDICAL RECORD NO.:  [id number]          PATIENT TYPE:  EMS   LOCATION:  MAJO                         FACILITY:  [facility]   PHYSICIAN:  Marlan Palau, M.D.  DATE OF BIRTH:  23-Feb-1966   DATE OF ADMISSION:  05/25/2007  DATE OF DISCHARGE:                              HISTORY & PHYSICAL   HISTORY OF PRESENT ILLNESS:  [name] is a 45 year old right-  handed black female born 1965-06-05 with a history of  hypertension.  This patient was admitted to [facility] in the  summer 2008 with a presumed TIA event affecting the left body.  The  patient had a workup at that time that included a CT of the head, MRI of  the brain, MRI angiogram of the intracranial vessels, carotid Doppler  study, 2-D echocardiogram all of which were unremarkable.  The patient  was placed on low-dose aspirin and has done well until now.  The patient  has had no problems with headaches since her blood pressure has been  controlled.  The patient is currently followed by Dr. Jonny Ruiz in [facility].  The patient comes in today noting onset of deficits  while at work around 11:00 a.m..  The patient noted some numbness and  heaviness of the right arm, difficulty with speech with slurring of  speech, but also the patient was leaving out small words such as  conjunctives in the sentence with a telegraphic speech pattern.  This  gradually improved over with next hour or so, and the patient is almost  normal at this point with only some residual tingling of the right arm.  CT of the head is not been done.  Neurology was called for further  evaluation.  The patient denies any headache, problems with walking,  blackout episodes, dizziness.   PAST MEDICAL HISTORY:  1. Transient right arm numbness and aphasia as above.  2. Obesity.  3. Hypertension.  4. History of headache, possible migraine the past.  5. Prior TIA event in July 2008.  6.  Hysterectomy.  7. D&C in the past.   MEDICATIONS:  1. Aspirin 325 mg daily.  2. Benicar 20 mg daily.  3. Diethylpropion ER 75 mg tablet for weight loss.   HABITS:  The patient does not smoke or drink.   ALLERGIES:  Again has no known allergies.   SOCIAL HISTORY:  The patient is single, lives in the [place], [place] area.  Has three children who are alive and well.  The patient  works for [employer].   FAMILY MEDICAL HISTORY:  Notable that father is still alive, has  hypertension and prostate cancer.  Mother died with hypertension and  stroke.  The patient has two sisters, one with rheumatoid arthritis and  one brother who is alive and well.   REVIEW OF SYSTEMS:  Notable for no recent fevers, chills.  The patient  denies headache, denies visual field changes, trouble swallowing, neck  pain.  Denies shortness of breath, chest pain, abdominal pain.  Does  have palpitation of the  heart at times, denies any problems controlling  bowels or bladder.  Denies any blackout episodes seizure-type events.   PHYSICAL EXAMINATION:  VITALS:  Blood pressure is 154, 98, heart rate  101, respiratory 18, temperature 100.1.  GENERAL:  The patient is a markedly/morbidly obese black female who is  alert, cooperative at time of examination.  HEENT: Examination is atraumatic.  Eyes: Pupils are equal, round and to  light.  Disks are flat bilaterally.  NECK:  Supple.  No carotid bruits noted.  RESPIRATORY:  Examination is clear.  CARDIOVASCULAR:  Examination reveals a regular rate and rhythm.  No  obvious murmurs, rubs noted.  Extremities are without significant edema.  NEUROLOGIC:  Cranial nerves as above.  Facial symmetry is present.  Patient has good sensation to face to pinprick soft touch bilaterally.  Has good strength in facial muscles and muscles to head turn and  shoulder shrug bilaterally.  Speech is well enunciated not aphasic.  Motor testing shows 5/5 strength in all  fours.  Good symmetric motor is  noted throughout.  Sensory testing is intact to pinprick, soft touch.  Sensation throughout.  Position sense intact in all fours.  No evidence  of extinction is noted.  The patient has good finger-nose-finger, heel-  to-shin.  No drift is seen in the upper extremities.  Gait was not  tested.  The patient has good symmetry to reflexes that are depressed  throughout.  Toes downgoing bilaterally.   NIH stroke scale score is zero blood work is pending.   IMPRESSION:  1. Possible TIA event with an event affecting the left brain with      right arm numbness and heaviness and aphasia.  2. Hypertension.  3. Obesity.   The patient will be admitted for brief evaluation.  The patient scores  an NIH stroke scale score is zero, at this point is not TPA candidate  due to minimal deficit.  Will admit this patient for further evaluation.   PLAN:  1. Admission to [facility]  2. MRI of the brain.  3. MRI angiogram of intracranial and extracranial vessels.  4. A 2D echocardiogram.  5. Transcranial Doppler bubble study.  6. Will change the patient to Plavix therapy.  7. Admission blood work urine drug screen.  Will follow patient's      clinical course while in-house.      Marlan Palau, M.D.  Electronically Signed     [facility]/MEDQ  D:  05/25/2007  T:  05/26/2007  Job:  [id number]   cc:   [facility]  [place] Dr. Jonny Ruiz

## 2010-08-29 NOTE — H&P (Signed)
NAME:  [name], [name]                       ACCOUNT NO.:  [id number]   MEDICAL RECORD NO.:  [id number]                   PATIENT TYPE:  OBV   LOCATION:  NA                                   FACILITY:  [facility]   PHYSICIAN:  Naima A. Dillard, M.D.              DATE OF BIRTH:  17-Feb-1966   DATE OF ADMISSION:  05/28/2003  DATE OF DISCHARGE:                                HISTORY & PHYSICAL   CHIEF COMPLAINT:  Menorrhagia and dysmenorrhea.   HISTORY AND PHYSICAL:  The patient is a 45 year old African-American female  gravida 3, para 3, who is presenting for definitive treatment for  dysmenorrhea and menorrhagia.  The patient underwent a D&C hysteroscopy in  May 2004 and still did not have any relief.  She did attempt to lose some  weight and was able to lose some, but menses are still heavy, lasting 9 to  11 days for several months.  The patient did try to go on birth control  pills; however, they contributed to high blood pressure, and she had to come  off.  She did not have any history of fibroids and denied being on any  hormone therapy and no new medications.  She is not having any menopausal  symptoms.  She did have an ultrasound January 2003 which showed her uterus  measured 9.6 without any fibroids.  She had a D&C hysteroscopy which was  significant for benign secretory endometrium.  No other abnormalities were  seen.   MEDICATIONS:  The patient is on hydrochlorothiazide daily.   PAST MEDICAL HISTORY:  Significant for hypertension, recently diagnosed.   PAST SURGICAL HISTORY:  Significant for a D&C and cone biopsy in 1992.  She  had a tubal ligation in 1996 and a D&C hysteroscopy in October 2004.   PAST OBSTETRICAL HISTORY:  Significant for full-term vaginal delivery x 3  without any complications.   ALLERGIES:  No known drug allergies.   SOCIAL HISTORY:  Negative for alcohol, tobacco, or any illicit drug use.   FAMILY HISTORY:  Significant for high blood pressure, denies  any history of  diabetes or GYN cancer.   REVIEW OF SYSTEMS:  Significant for obesity, menorrhagia, and hypertension.   PHYSICAL EXAMINATION:  VITAL SIGNS:  The patient weighs 263 pounds.  Blood  pressure 140/86.  HEENT:  Pupils are equal.  Hearing is normal.  Throat is clear.  NECK:  Thyroid is not enlarged.  HEART:  Regular rate and rhythm.  CHEST:  Clear to auscultation bilaterally.  BREASTS:  No masses, discharge, skin changes, or nipple retractions.  BACK:  No CVA tenderness.  ABDOMEN: Nontender without any masses, organomegaly.  It is, however, obese  and nontender.  EXTREMITIES:  No cyanosis, clubbing, or edema.  NEUROLOGIC:  Within normal limits.Marland Kitchen  PELVIC:  Vulva and vaginal exams normal.  Cervix is nontender without any  lesions.  Uterus is normal size, shape, and consistency and nontender.  Adnexa have no masses.   LABORATORY DATA:  She had endometrial curetting in May 2004 which showed  benign secretory endometrium.   She had a Pap smear in March 2004 which was negative for any intraepithelial  lesions or malignancy.   IMPRESSION:  Dysmenorrhea and menorrhagia.  The patient desires definitive  treatment.  She was offered Provera, Depo-Provera, Morena, uterine ablation,  hysterectomy, and/or observation.  The patient chose to have a hysterectomy.  She understands the risks are but not limited to bleeding, infection, damage  to internal organs such as bowel, bladder, and major blood vessels.  She  understands she will not be able to have any further children. She  understands this will be attempted laparoscopically and vaginally; however,  she may end up with an abdominal incision leading to abdominal hysterectomy.  The patient is aware of all her risks and decides to proceed with the  procedure.                                               Naima A. Normand Sloop, M.D.    NAD/MEDQ  D:  05/27/2003  T:  05/28/2003  Job:  [id number]

## 2010-08-29 NOTE — H&P (Signed)
NAME:  [name], [name]                       ACCOUNT NO.:  [id number]   MEDICAL RECORD NO.:  [id number]                   PATIENT TYPE:  AMB   LOCATION:  SDC                                  [facility]ACILITY:  [facility]   PHYSICIAN:  Naima A. Dillard, M.D.              DATE OF BIRTH:  [birth year]   DATE OF ADMISSION:  DATE OF DISCHARGE:                                HISTORY & PHYSICAL   CHIEF COMPLAINT:  Menorrhagia with endometrial polyps.   HISTORY OF PRESENT ILLNESS:  The patient is a 45 year old African-American  female who presented to me complaining of some cramping with menses and  cycle lasting from nine to 11 days for several months.  The patient  currently is not using any contraception.  She denies having a history of  fibroids.  She does not take any hormone therapy.  She is on no new  medication.  She does not have any menopausal symptoms and no vaginal  discharge.  Occasional cramping with her menses, but no severe abdominal  pain and no increased stress.  The patient had an ultrasound in January  2003, which showed normal size uterus measuring 9.6 cm with an endometrial  polyp at the fundus measuring 1.4 cm in total.   MEDICATIONS:  The patient is currently on no medications.   PAST MEDICAL HISTORY:  The patient's past medical history is unremarkable.   PAST SURGICAL HISTORY:  Significant for a D&C and cone biopsy in 1992.  She  had a tubal ligation in 1996.   PAST OBSTETRICAL HISTORY:  Significant for full-term vaginal delivery times  three without any complications.   SOCIAL HISTORY:  Negative times three.   FAMILY HISTORY:  Significant for high blood pressure.  No diabetes or Gyn  CA.   REVIEW OF SYSTEMS:  Significant for obesity and menorrhagia.   PHYSICAL EXAMINATION:  VITAL SIGNS:  On physical exam the patient's blood  pressure is 120/90.  Her weight is 276 pounds.  HEENT:  Pupils are equal.  Hearing is normal.  Throat is clear.  NECK:  Thyroid is not  enlarged.  HEART:  Regular rate and rhythm.  CHEST:  Clear to auscultation bilaterally.  BREASTS:  No masses, discharge, skin changes or nipple retraction.  There  are pendulous bilaterally.  BACK:  No CVA tenderness bilaterally.  ABDOMEN:  Nontender, no masses or organomegaly.  Obese and soft.  EXTREMITIES:  No cyanosis, clubbing or edema.  NEUROLOGIC EXAMINATION:  Within normal limits.  PELVIC EXAMINATION:  Vulvovaginal exam is within normal limits.  Cervix is  nontender without any lesions.  Uterus; normal size, shape and consistency,  and nontender.  It is difficult to tell the truth secondary to the patient's  obesity.  Adnexa have no masses.   ASSESSMENT:  Menorrhagia with endometrial polyp.   PLAN:  D&C, hysteroscopy and polypectomy.  The patient was also offered  cryoablation, but decided against it at  this time.   The patient understands the risks are, but not limited to bleeding,  infection, damage to internal organs through performed like bowel and  bladder, and also Asherman syndrome.                                                 Naima A. Normand Sloop, M.D.    NAD/MEDQ  D:  08/31/2002  T:  08/31/2002  Job:  [id number]

## 2010-08-29 NOTE — Op Note (Signed)
NAME:  [name], [name]                       ACCOUNT NO.:  [id number]   MEDICAL RECORD NO.:  [id number]                   PATIENT TYPE:  OBV   LOCATION:  9318                                 FACILITY:  [facility]   PHYSICIAN:  Naima A. Dillard, M.D.              DATE OF BIRTH:  1966/03/06   DATE OF PROCEDURE:  DATE OF DISCHARGE:                                 OPERATIVE REPORT   ADDENDUM TO OPERATIVE REPORT:  This is an addendum to [name]'s  operative report.   PROCEDURE:  Laparoscopically assisted vaginal hysterectomy and bilateral  salpingectomy.   PROCEDURE IN DETAIL:  After the patient's uterine ovarian ligaments were  cauterized and cut, a tripolar was then used to do bilateral  salpingectomies, and the tubes were removed through the 10 mm port incision.  The decision was made to take her tubes because they were already disrupted  by tubal ligation and contained several cysts which could later cause  difficulty in differentiating between the cysts on her ovary or her tube.  This was done without difficulty, and hemostasis was assured.                                               Naima A. Normand Sloop, M.D.    NAD/MEDQ  D:  05/29/2003  T:  05/29/2003  Job:  [id number]

## 2010-08-29 NOTE — Op Note (Signed)
   NAME:  [name], BEIRNE                       ACCOUNT NO.:  [id number]   MEDICAL RECORD NO.:  [id number]                   PATIENT TYPE:  AMB   LOCATION:  SDC                                  [facility]   PHYSICIAN:  Naima A. Dillard, M.D.              DATE OF BIRTH:  11-09-1965   DATE OF PROCEDURE:  09/01/2002  DATE OF DISCHARGE:                                 OPERATIVE REPORT   PREOPERATIVE DIAGNOSES:  1. Menorrhagia.  2. Endometrial polyp.   POSTOPERATIVE DIAGNOSIS:  Menorrhagia.   PROCEDURE:  Dilatation and curettage, hysteroscopy.   SURGEON:  Naima A. Normand Sloop, M.D.   ANESTHESIA:  General laryngeal mask airway.   FLUIDS REPLACED:  900 mL crystalloid.   URINE OUTPUT:  100 mL.   SORBITOL DEFICIT:  There was a 30 mL deficit of 3% sorbitol.   COMPLICATIONS:  None.   FINDINGS:  The uterus sounded to 9 cm, top normal size uterus.  No adnexal  masses palpated.  Normal-appearing cervix.  Vaginal was within normal  limits.  The patient had abundant fluffy endometrium seen during  hysteroscopy.  There were no polyps or fibroids noted.   DESCRIPTION OF PROCEDURE:  The patient was taken to the operating room,  where she was given general anesthesia with laryngeal mask airway, placed in  the dorsal lithotomy position, prepped and draped in a normal sterile  fashion.  A bivalve speculum was placed into the vagina.  The anterior lip  of the cervix was grasped with a single-tooth tenaculum.  The cervix was  dilated with Pratt dilators up to 21.  A hysteroscope was then placed into  the uterine cavity.  The findings noted above were seen.  The hysteroscope  was removed.  A sharp curettage was done of the uterine cavity until a  gritty texture was noted.  There was abundant fluffy endometrium sent to  pathology.  All instruments were removed from the vagina.  The tenaculum was  removed from the cervix with good hemostasis.  Sponge, lap, and needle  counts were correct x2.   The patient went to the recovery room in stable  condition.                                                Naima A. Normand Sloop, M.D.    NAD/MEDQ  D:  09/01/2002  T:  09/01/2002  Job:  [id number]

## 2010-08-29 NOTE — Op Note (Signed)
NAME:  [name], [name]                       ACCOUNT NO.:  [id number]   MEDICAL RECORD NO.:  [id number]                   PATIENT TYPE:  OBV   LOCATION:  9318                                 [facility]ACILITY:  [facility]   PHYSICIAN:  Natalie A. Dillard, M.D.              DATE OF BIRTH:  [birth year]   DATE OF PROCEDURE:  DATE OF DISCHARGE:                                 OPERATIVE REPORT   PREOPERATIVE DIAGNOSIS:  Menorrhagia and dysmenorrhea.   POSTOPERATIVE DIAGNOSIS:  Menorrhagia and dysmenorrhea.   PROCEDURE:  Laparoscopically assisted vaginal hysterectomy.   ANESTHESIA:  General endotracheal tube.   SURGEON:  [name], M.D.   ASSISTANT:  Osborn Coho, M.D.   ESTIMATED BLOOD LOSS:  600 mL.   IV FLUIDS:  3000 mL of crystalloid.   URINE OUTPUT:  130 mL clear urine.   COMPLICATIONS:  None.   FINDINGS:  Eight-week size boggy uterus with normal-appearing ovaries  bilaterally.  Bilateral tubal cysts.  Bilateral tubal interruption from  bilateral tubal ligation.  Normal abdominal anatomy.  The patient to  recovery room in a stable condition.   PROCEDURE IN DETAIL:  The patient was taken to the operating room where she  was given general anesthesia and placed in the dorsal lithotomy position and  prepped and draped in a normal sterile fashion.  A Foley catheter was  placed.  An infraumbilical vertical incision was made with a knife after 5  mL of 0.25% Marcaine was injected into the infraumbilical incision.  The  incision was extended with a knife, and a Veress needle was placed into the  abdominal cavity up to the abdomen at a 45-degree angle.  Intra-abdominal  placement was confirmed with a fluid-filled syringe and decrease in CO2  pressure.  The abdomen was insufflated with about 2.5 liters of CO2 gas.  The Veress needle was removed.  A 10 mm trocar was placed into the abdomen,  and intra-abdominal placement was confirmed with a laparoscope.  The patient  was noted to have a  boggy, eight-week size uterus and normal ovaries  bilaterally, a corpus luteal cyst of the right ovary.  The tubes had several  peritubal cysts and interruption, normal abdominal anatomy, normal bowel,  normal liver.  Attention was then turned to the patient's left lower  quadrant where 2.5 mL of 0.25% Marcaine was placed in the left lower  quadrant in the trocar.  A 5 mm trocar was placed under direct visualization  with a laparoscope, avoiding the epigastric vessels.  Attention was turned  to the lower right quadrant where 2.5 mL of 0.25% Marcaine was placed, and a  5 mm trocar was placed under direct visualization with the laparoscope.  The  patient's round ligaments were cauterized and then cut.  A bladder flap was  created, and hydrodissection cut with Metzenbaum scissors and created  bluntly and sharply.  The patient's utero-ovarian ligaments were cauterized  and cut.  Hemostasis was assured.  Attention was then turned to the vagina.  Before the case started, a Hulka manipulator was placed into the uterine  cavity.  The Hulka tenaculum was removed.  The anterior and posterior lip of  the cervix was grasped with single tooth tenaculums.  20 mL and 100 units of  Pitressin were then used in circumferential fashion along the cervix.  The  cervix was cut in a circumferential fashion and both bluntly and sharply  removed off the cervical fascia.  The posterior peritoneum was entered  sharply with Mayo scissors.  The posterior peritoneum and anterior  peritoneum were both entered with Metzenbaum scissors.  The uterosacral  ligaments were clamped, cut, and suture ligated.  Hemostasis was assured.  The cardinal ligaments were clamped, cut, and suture ligated.  Hemostasis  was assured.  The uterine artery was clamped, cut, and suture ligated.  Hemostasis was assured.  The uterus was removed.  The McConnell suture was  placed.  Two angle stitches were placed at each part of the cuff and sewed   in a running locked fashion with 0 Vicryl after each pedicle was noted to be  hemostatic coming from each angle horizontally in a running locked fashion.  Hemostasis was assured.  The McConnell suture was then tied.  A sponge stick  was placed into the vagina.  The abdomen was insufflated with CO2 gas once  again.  There was a small bleeding area near the right uterosacral ligament.  The ureter was found, and the bleeding areas were hemostatic with the use of  cautery.  Hemostasis was assured.  The pelvis was irrigated with saline.  Hemostasis was assured.  The 10 mm trocar and laparoscope was removed.  A 5  mm port was placed in the lower left quadrant, a 5 mm scope.  The fascia was  then closed while watching with the 5 mm scope because the patient's pannus  was very large.  We had to take deep bites.  We watched with the 5 mm scope  to make sure that no bowel was interrupted, and it was closed without  difficulty. All instruments were removed on direct visualization of the  laparoscope.  Air was allowed to leave the abdomen. Sponge, lap, and needle  counts were correct x2.  The fascial incision at the umbilical site was  closed with 0 Vicryl.  All skin incisions were closed with 4-0 Vicryl.  Sponge, lap, and needle counts were correct x2.  The vagina was packed with  vaginal packing.                                               [name], M.D.    NAD/MEDQ  D:  05/28/2003  T:  05/28/2003  Job:  [id number]

## 2010-08-29 NOTE — Discharge Summary (Signed)
NAME:  [name], [name]                       ACCOUNT NO.:  [id number]   MEDICAL RECORD NO.:  [id number]                   PATIENT TYPE:  OBV   LOCATION:  9318                                 FACILITY:  [facility]   PHYSICIAN:  Naima A. Dillard, M.D.              DATE OF BIRTH:  17-Oct-1965   DATE OF ADMISSION:  05/28/2003  DATE OF DISCHARGE:  05/29/2003                                 DISCHARGE SUMMARY   PREOPERATIVE DIAGNOSES:  Menorrhagia and dysmenorrhea.   POSTOPERATIVE DIAGNOSES:  Menorrhagia and dysmenorrhea; postoperative day #1  status post laparoscopically assisted vaginal hysterectomy and bilateral  salpingo-oophorectomy.   DISCHARGE MEDICATIONS:  1. Colace.  2. Percocet.  3. Motrin.  4. Phenergan.  5. Hydrochlorothiazide (which was taken by the patient before the     procedure).   FOLLOW UP:  The patient is to follow up with me in 4-6 weeks in the office  as scheduled.   DISCHARGE INSTRUCTIONS:  She is to keep her incisions clean and dry.  The  patient is instructed that she is to call for any heavy vaginal bleeding,  any intractable nausea or vomiting or intractable pain.   HOSPITAL COURSE:  The patient underwent a laparoscopically assisted vaginal  hysterectomy and bilateral salpingo-oophorectomy on May 28, 2003.  On  postoperative day #1 the patient was doing well. She remained afebrile with  stable vital signs.  Pulse oximetry remained low at 92%, but after walking  and incentive spirometry it did increase to 100%; and, her pulse did raise  to 119. Her blood pressure remained stable. Recheck of the pulse before  discharge was actually of 109; it had been stable from 70s to as high as  119, and that was after she had been walking. The patient stated she was a  little bit fatigued.  Heart is regular.  Abdomen is soft, obese and  nontender.  Vaginal packing was removed and ___________. Extremities had no  clubbing, cyanosis or edema.   Pertinent laboratory data  includes:  preoperatively hemoglobin was 12.4;  postoperatively hemoglobin was 9.5.  Platelets remained stable.  Creatinine  preoperatively was 1, postoperatively 0.8.   The patient is voiding spontaneously, passing gas and tolerating a regular  diet.  Her vital signs have remained stable.  She is deemed to benefit from  her hospital stay.   DISCHARGE INSTRUCTIONS:  She will be discharged; no driving for two weeks,  pelvic rest for six weeks.  She is to follow-up with me in the office.  The  patient will also be given [facility] discharge instructions.                                               Naima A. Normand Sloop, M.D.    NAD/MEDQ  D:  05/29/2003  T:  05/29/2003  Job:  [id number]

## 2010-09-15 ENCOUNTER — Encounter: Payer: Self-pay | Admitting: Internal Medicine

## 2010-09-16 ENCOUNTER — Encounter: Payer: Self-pay | Admitting: Internal Medicine

## 2010-09-16 ENCOUNTER — Other Ambulatory Visit (INDEPENDENT_AMBULATORY_CARE_PROVIDER_SITE_OTHER): Payer: 59

## 2010-09-16 ENCOUNTER — Ambulatory Visit (INDEPENDENT_AMBULATORY_CARE_PROVIDER_SITE_OTHER): Payer: 59 | Admitting: Internal Medicine

## 2010-09-16 VITALS — BP 114/80 | HR 62 | Temp 98.7°F | Resp 16 | Wt 246.0 lb

## 2010-09-16 DIAGNOSIS — Z1231 Encounter for screening mammogram for malignant neoplasm of breast: Secondary | ICD-10-CM

## 2010-09-16 DIAGNOSIS — I10 Essential (primary) hypertension: Secondary | ICD-10-CM

## 2010-09-16 DIAGNOSIS — M300 Polyarteritis nodosa: Secondary | ICD-10-CM

## 2010-09-16 DIAGNOSIS — Z0000 Encounter for general adult medical examination without abnormal findings: Secondary | ICD-10-CM

## 2010-09-16 LAB — COMPREHENSIVE METABOLIC PANEL
AST: 16 U/L (ref 0–37)
Albumin: 3.6 g/dL (ref 3.5–5.2)
Alkaline Phosphatase: 153 U/L — ABNORMAL HIGH (ref 39–117)
Calcium: 9.2 mg/dL (ref 8.4–10.5)
Chloride: 105 mEq/L (ref 96–112)
Creatinine, Ser: 0.9 mg/dL (ref 0.4–1.2)
GFR: 84.38 mL/min (ref >60.00)
Glucose, Bld: 91 mg/dL (ref 70–99)
Total Bilirubin: 0.1 mg/dL — ABNORMAL LOW (ref 0.3–1.2)
Total Protein: 7.1 g/dL (ref 6.0–8.3)

## 2010-09-16 LAB — LIPID PANEL
Cholesterol: 157 mg/dL (ref 0–200)
HDL: 55.3 mg/dL (ref >39.00)
LDL Cholesterol: 90 mg/dL (ref 0–99)
Total CHOL/HDL Ratio: 3
Triglycerides: 61 mg/dL (ref 0.0–149.0)
VLDL: 12.2 mg/dL (ref 0.0–40.0)

## 2010-09-16 LAB — CBC WITH DIFFERENTIAL/PLATELET
Eosinophils Absolute: 0.2 10*3/uL (ref 0.0–0.7)
Eosinophils Relative: 3 % (ref 0.0–5.0)
HCT: 36.3 % (ref 36.0–46.0)
Hemoglobin: 12.4 g/dL (ref 12.0–15.0)
Lymphs Abs: 1.9 10*3/uL (ref 0.7–4.0)
MCHC: 34 g/dL (ref 30.0–36.0)
MCV: 84.1 fl (ref 78.0–100.0)
Monocytes Absolute: 0.4 10*3/uL (ref 0.1–1.0)
Neutro Abs: 3.8 10*3/uL (ref 1.4–7.7)

## 2010-09-16 LAB — TSH: TSH: 1.63 u[IU]/mL (ref 0.35–5.50)

## 2010-09-16 MED ORDER — OLMESARTAN MEDOXOMIL-HCTZ 40-12.5 MG PO TABS: 1.0000 | ORAL_TABLET | Freq: Every day | ORAL | Status: DC

## 2010-09-16 NOTE — Assessment & Plan Note (Signed)
Her BP is well controlled, today I will monitor her lytes and renal function 

## 2010-09-16 NOTE — Progress Notes (Signed)
  Subjective:    Patient ID: [name], female    DOB: 10/06/1965, 45 y.o.   MRN: [id number]  Hypertension This is a chronic problem. The current episode started more than 1 year ago. The problem has been gradually improving since onset. The problem is controlled. Pertinent negatives include no anxiety, blurred vision, chest pain, headaches, malaise/fatigue, neck pain, orthopnea, palpitations, peripheral edema, PND, shortness of breath or sweats. There are no associated agents to hypertension. Past treatments include diuretics and angiotensin blockers. The current treatment provides significant improvement. There are no compliance problems.       Review of Systems  Constitutional: Negative for fever, chills, malaise/fatigue, diaphoresis, activity change, appetite change, fatigue and unexpected weight change.  HENT: Negative for facial swelling, trouble swallowing, neck pain, neck stiffness and voice change.   Eyes: Negative for blurred vision, photophobia and visual disturbance.  Respiratory: Negative for cough, chest tightness, shortness of breath, wheezing and stridor.   Cardiovascular: Negative for chest pain, palpitations, orthopnea, leg swelling and PND.  Gastrointestinal: Negative for nausea, vomiting, abdominal pain, diarrhea, constipation and anal bleeding.  Genitourinary: Negative for dysuria, urgency, frequency, hematuria, flank pain, decreased urine volume, vaginal bleeding, vaginal discharge, enuresis, difficulty urinating, genital sores, vaginal pain, menstrual problem, pelvic pain and dyspareunia.  Musculoskeletal: Negative for myalgias, back pain, joint swelling, arthralgias and gait problem.  Skin: Negative for color change, pallor and rash.  Neurological: Negative for dizziness, tremors, seizures, syncope, facial asymmetry, speech difficulty, weakness, light-headedness, numbness and headaches.  Hematological: Negative for adenopathy. Does not bruise/bleed easily.    Psychiatric/Behavioral: Negative.        Objective:   Physical Exam  Vitals reviewed. Constitutional: She is oriented to person, place, and time. She appears well-developed and well-nourished. No distress.  HENT:  Head: Normocephalic and atraumatic.  Right Ear: External ear normal.  Left Ear: External ear normal.  Nose: Nose normal.  Mouth/Throat: Oropharynx is clear and moist. No oropharyngeal exudate.  Eyes: Conjunctivae and EOM are normal. Pupils are equal, round, and reactive to light. Right eye exhibits no discharge. Left eye exhibits no discharge. No scleral icterus.  Neck: Normal range of motion. Neck supple. No JVD present. No tracheal deviation present. No thyromegaly present.  Cardiovascular: Normal rate, regular rhythm, normal heart sounds and intact distal pulses.  Exam reveals no gallop and no friction rub.   No murmur heard. Pulmonary/Chest: Effort normal and breath sounds normal. No stridor. No respiratory distress. She has no wheezes. She has no rales. She exhibits no tenderness.  Abdominal: Soft. Bowel sounds are normal. She exhibits no distension and no mass. There is no tenderness. There is no rebound and no guarding.  Musculoskeletal: Normal range of motion. She exhibits no edema and no tenderness.  Lymphadenopathy:    She has no cervical adenopathy.  Neurological: She is alert and oriented to person, place, and time. She has normal reflexes. She displays normal reflexes. No cranial nerve deficit. She exhibits normal muscle tone. Coordination normal.  Skin: Skin is warm and dry. No rash noted. She is not diaphoretic. No erythema. No pallor.  Psychiatric: She has a normal mood and affect. Her behavior is normal. Judgment and thought content normal.          Assessment & Plan:

## 2010-09-16 NOTE — Assessment & Plan Note (Signed)
This is in remission 

## 2010-09-16 NOTE — Patient Instructions (Signed)

## 2010-09-16 NOTE — Assessment & Plan Note (Signed)
ordered

## 2010-09-16 NOTE — Assessment & Plan Note (Signed)
Routine labs ordered, she wants her GYN to do her breast exam and her PAP smear

## 2010-09-29 ENCOUNTER — Ambulatory Visit
Admission: RE | Admit: 2010-09-29 | Discharge: 2010-09-29 | Disposition: A | Discharge status: Home / Self Care | Payer: 59 | Source: Ambulatory Visit | Attending: Internal Medicine | Admitting: Internal Medicine

## 2010-09-29 DIAGNOSIS — Z1231 Encounter for screening mammogram for malignant neoplasm of breast: Secondary | ICD-10-CM

## 2010-12-25 ENCOUNTER — Ambulatory Visit (INDEPENDENT_AMBULATORY_CARE_PROVIDER_SITE_OTHER): Payer: 59 | Admitting: Internal Medicine

## 2010-12-25 ENCOUNTER — Other Ambulatory Visit (INDEPENDENT_AMBULATORY_CARE_PROVIDER_SITE_OTHER): Payer: 59

## 2010-12-25 ENCOUNTER — Encounter: Payer: Self-pay | Admitting: Internal Medicine

## 2010-12-25 DIAGNOSIS — M300 Polyarteritis nodosa: Secondary | ICD-10-CM

## 2010-12-25 DIAGNOSIS — I10 Essential (primary) hypertension: Secondary | ICD-10-CM

## 2010-12-25 DIAGNOSIS — I776 Arteritis, unspecified: Secondary | ICD-10-CM

## 2010-12-25 DIAGNOSIS — E669 Obesity, unspecified: Secondary | ICD-10-CM

## 2010-12-25 LAB — COMPREHENSIVE METABOLIC PANEL
Albumin: 3.9 g/dL (ref 3.5–5.2)
Alkaline Phosphatase: 157 U/L — ABNORMAL HIGH (ref 39–117)
BUN: 12 mg/dL (ref 6–23)
Calcium: 9.3 mg/dL (ref 8.4–10.5)
Chloride: 105 mEq/L (ref 96–112)
Creatinine, Ser: 1 mg/dL (ref 0.4–1.2)
Glucose, Bld: 97 mg/dL (ref 70–99)
Potassium: 4.7 mEq/L (ref 3.5–5.1)

## 2010-12-25 LAB — LIPID PANEL
HDL: 57.5 mg/dL (ref >39.00)
Total CHOL/HDL Ratio: 3
VLDL: 12.4 mg/dL (ref 0.0–40.0)

## 2010-12-25 LAB — CBC WITH DIFFERENTIAL/PLATELET
Basophils Relative: 0.4 % (ref 0.0–3.0)
Eosinophils Relative: 2 % (ref 0.0–5.0)
MCV: 84.5 fl (ref 78.0–100.0)
Monocytes Relative: 7.5 % (ref 3.0–12.0)
Neutrophils Relative %: 59.5 % (ref 43.0–77.0)
Platelets: 257 10*3/uL (ref 150.0–400.0)
RBC: 4.74 Mil/uL (ref 3.87–5.11)
WBC: 6.9 10*3/uL (ref 4.5–10.5)

## 2010-12-25 LAB — SEDIMENTATION RATE: Sed Rate: 39 mm/hr — ABNORMAL HIGH (ref 0–22)

## 2010-12-25 LAB — HEMOGLOBIN A1C: Hgb A1c MFr Bld: 6 % (ref 4.6–6.5)

## 2010-12-25 LAB — TSH: TSH: 2.18 u[IU]/mL (ref 0.35–5.50)

## 2010-12-25 MED ORDER — METHYLPREDNISOLONE ACETATE 80 MG/ML IJ SUSP
120.0000 mg | Freq: Once | INTRAMUSCULAR | Status: AC
  Administered 2010-12-25: 120 mg via INTRAMUSCULAR

## 2010-12-25 NOTE — Progress Notes (Signed)
Subjective:    Patient ID: [name], female    DOB: Aug 18, 1965, 45 y.o.   MRN: [id number]  HPI  She returns complaining of a 2 week history of pain and nodules in her left leg - this is similar to an episode of PAN she had about 2 years ago. She saw a rheumatologist and was told to take prednisone indefinitely but she did not like the side effects so she has not been compliant. She also complains of worsening weight gain.  Review of Systems  Constitutional: Positive for unexpected weight change (weight gain). Negative for fever, chills, diaphoresis, activity change, appetite change and fatigue.  HENT: Negative for sore throat, facial swelling, neck pain, neck stiffness and voice change.   Eyes: Negative.   Respiratory: Negative for apnea, cough, choking, chest tightness, shortness of breath, wheezing and stridor.   Cardiovascular: Positive for leg swelling. Negative for chest pain and palpitations.  Gastrointestinal: Negative for nausea, vomiting, abdominal pain, diarrhea, constipation, blood in stool, abdominal distention, anal bleeding and rectal pain.  Genitourinary: Negative for dysuria, urgency, frequency, hematuria, flank pain, decreased urine volume, enuresis, difficulty urinating and dyspareunia.  Musculoskeletal: Positive for arthralgias (in her left shin). Negative for myalgias, back pain, joint swelling and gait problem.  Skin: Negative for color change, pallor, rash and wound.  Neurological: Negative for dizziness, tremors, seizures, syncope, facial asymmetry, speech difficulty, weakness, light-headedness, numbness and headaches.  Hematological: Negative for adenopathy. Does not bruise/bleed easily.  Psychiatric/Behavioral: Negative.        Objective:   Physical Exam  Vitals reviewed. Constitutional: She is oriented to person, place, and time. She appears well-developed and well-nourished. No distress.  HENT:  Mouth/Throat: No oropharyngeal exudate.  Eyes:  Conjunctivae are normal. Right eye exhibits no discharge. Left eye exhibits no discharge. No scleral icterus.  Neck: Normal range of motion. Neck supple. No JVD present. No tracheal deviation present. No thyromegaly present.  Cardiovascular: Normal rate, regular rhythm, normal heart sounds and intact distal pulses.  Exam reveals no gallop and no friction rub.   No murmur heard. Pulses:      Carotid pulses are 1+ on the right side, and 1+ on the left side.      Radial pulses are 1+ on the right side, and 1+ on the left side.       Femoral pulses are 1+ on the right side, and 1+ on the left side.      Popliteal pulses are 1+ on the right side, and 1+ on the left side.       Dorsalis pedis pulses are 1+ on the right side, and 1+ on the left side.       Posterior tibial pulses are 1+ on the right side, and 1+ on the left side.  Pulmonary/Chest: Effort normal and breath sounds normal. No stridor. No respiratory distress. She has no wheezes. She has no rales. She exhibits no tenderness.  Abdominal: Soft. Bowel sounds are normal. She exhibits no distension and no mass. There is no tenderness. There is no rebound and no guarding.  Musculoskeletal: Normal range of motion. She exhibits tenderness (she has 3 subQ nodules and ttp over her left tib/fib area anteriorly and laterally). She exhibits no edema.  Lymphadenopathy:    She has no cervical adenopathy.  Neurological: She is oriented to person, place, and time. She displays normal reflexes. She exhibits normal muscle tone.  Skin: Skin is warm and dry. No rash noted. She is not diaphoretic. Erythema: LLE.  No pallor.  Psychiatric: She has a normal mood and affect. Her behavior is normal. Judgment and thought content normal.          Assessment & Plan:

## 2010-12-25 NOTE — Assessment & Plan Note (Signed)
She has had a flare so I gave her an injection of depo-medrol IM

## 2010-12-25 NOTE — Assessment & Plan Note (Signed)
Nutrition referral.

## 2010-12-25 NOTE — Assessment & Plan Note (Signed)
Her BP is well controlled, I will check her lytes and renal function 

## 2010-12-25 NOTE — Assessment & Plan Note (Signed)
I will check labs to look for severity

## 2010-12-25 NOTE — Patient Instructions (Signed)
Hypertension (High Blood Pressure) As your heart beats, it forces blood through your arteries. This force is your blood pressure. If the pressure is too high, it is called hypertension (HTN) or high blood pressure. HTN is dangerous because you may have it and not know it. High blood pressure may mean that your heart has to work harder to pump blood. Your arteries may be narrow or stiff. The extra work puts you at risk for heart disease, stroke, and other problems.  Blood pressure consists of two numbers, a higher number over a lower, 110/72, for example. It is stated as "110 over 72." The ideal is below 120 for the top number (systolic) and under 80 for the bottom (diastolic). Write down your blood pressure today. You should pay close attention to your blood pressure if you have certain conditions such as:  Heart failure.  Prior heart attack.   Diabetes   Chronic kidney disease.   Prior stroke.   Multiple risk factors for heart disease.   To see if you have HTN, your blood pressure should be measured while you are seated with your arm held at the level of the heart. It should be measured at least twice. A one-time elevated blood pressure reading (especially in the Emergency Department) does not mean that you need treatment. There may be conditions in which the blood pressure is different between your right and left arms. It is important to see your caregiver soon for a recheck. Most people have essential hypertension which means that there is not a specific cause. This type of high blood pressure may be lowered by changing lifestyle factors such as:  Stress.  Smoking.   Lack of exercise.   Excessive weight.  Drug/tobacco/alcohol use.   Eating less salt.   Most people do not have symptoms from high blood pressure until it has caused damage to the body. Effective treatment can often prevent, delay or reduce that damage. TREATMENT Treatment for high blood pressure, when a cause has been  identified, is directed at the cause. There are a large number of medications to treat HTN. These fall into several categories, and your caregiver will help you select the medicines that are best for you. Medications may have side effects. You should review side effects with your caregiver. If your blood pressure stays high after you have made lifestyle changes or started on medicines,   Your medication(s) may need to be changed.   Other problems may need to be addressed.   Be certain you understand your prescriptions, and know how and when to take your medicine.   Be sure to follow up with your caregiver within the time frame advised (usually within two weeks) to have your blood pressure rechecked and to review your medications.   If you are taking more than one medicine to lower your blood pressure, make sure you know how and at what times they should be taken. Taking two medicines at the same time can result in blood pressure that is too low.  SEEK IMMEDIATE MEDICAL CARE IF YOU DEVELOP:  A severe headache, blurred or changing vision, or confusion.   Unusual weakness or numbness, or a faint feeling.   Severe chest or abdominal pain, vomiting, or breathing problems.  MAKE SURE YOU:   Understand these instructions.   Will watch your condition.   Will get help right away if you are not doing well or get worse.  Document Released: 03/30/2005 Document Re-Released: 09/17/2009 [employer] Patient Information 2011 [employer].Vasculitis Vasculitis is when your blood vessels are inflamed. There are many different blood vessels in the body, and vasculitis can affect any of them. This includes large (veins and arteries) and small (capillaries) vessels. With vasculitis,   Blood vessel walls can become thick.   Blood vessels can become narrow.   Blood vessels can become weak. Sometimes, it becomes so weak that the blood vessel bulges out like a balloon. This is called an aneurysm. Aneurysms  are rare but can be life-threatening.   Scarring can occur.   Not enough blood can flow through the blood vessels.  All of these things can damage many parts of the body, including the muscles, kidneys, lungs and brain. There are many types of vasculitis. Some types are short-term (acute), while others are long-term (chronic). Some types may go away without treatment, and others may need to be treated for a long time.  CAUSES Vasculitis occurs when the body's immune system (which fights germs and disease) makes a mistake. It attacks its own blood vessels. This causes inflammation (the body's way of reacting to injury or infection).   Why this happens is usually not known. The condition is then called primary vasculitis.   Sometimes, something triggers the inflammation. This is called secondary vasculitis. Possible causes include:   Infections.   An immune system disease. Examples include lupus, rheumatoid arthritis and scleroderma.   An allergic reaction to a medicine.   Cancer that affects blood cells. This includes leukemia and lymphoma.   Males and females of all ages and races can develop vasculitis. Some risk factors make vasculitis more likely. These include:   Smoking.   Stress.   Physical injury.  SYMPTOMS There are more than 20 types of vasculitis. Symptoms of each type vary, but some symptoms are common.  Many people with vasculitis:   Have a fever.   Do not feel like eating.   Lose weight.   Feel very tired.   Have aches and pains.   Feel weak.   Start to not have feeling (numbness) in an area.   Symptoms for some types of vasculitis also could be:   Sores in the mouth or eyes.   Skin problems. This could be sores, spots or rashes.   Trouble seeing.   Trouble breathing.   Blood in the urine.   Headaches.   Pain in the abdomen.   Stuffy or bloody nose.  DIAGNOSIS Vasculitis symptoms are similar to symptoms of many other conditions. That can  make it hard to tell if you have vasculitis. To be sure, your caregiver will ask about your symptoms and do a physical exam. Certain tests may be necessary, such as:   A complete blood count (CBC). This test shows how many red blood cells are in your blood. Not having enough red blood cells (anemic) can result from vasculitis.   Erythrocyte sedimentation (also called sed rate test). It measures inflammation in the body.   C reactive protein (CRP). This also shows if there is inflammation.   Anti-neutrophil cytoplasmic antibodies (ANCA). This can tell if the immune system is reacting to certain cells in the blood.   A urine test. This checks for blood or protein in the urine. That could be a sign of kidney damage from vasculitis.   Imaging tests. These tests create pictures from inside the body. Options include:   X-rays.   Computed tomography (CT) scan. This uses X-rays guided by a computer.   Ultrasound. It creates an  image using sound waves.   Magnetic resonance imaging (MRI). It uses radio waves, magnets and a computer.   Angiography. A dye is put into your blood vessels. Then, an X-ray is taken of them.   A biopsy of a blood vessel. This means your caregiver will take out a small piece of a blood vessel. Then, it is checked under a microscope. This is an important test. It often is the best way to know for sure if you have vasculitis.  TREATMENT  Treatment will depend on the type of vasculitis and how severe it is. Often, you will need to see a specialist in immunologic diseases (rheumatologist).   Some types of vasculitis may go away without treatment.   Some types need only over-the-counter drugs.   Prescription medicines are used to treat many types of vasculitis. For example:   Corticosteroids. These are the drugs used most often. They are very powerful. Usually, a high dose is taken until symptoms improve. Then, the dose is gradually decreased. Using corticosteroids for a  long time can cause problems. They can make muscles and bones weak. They can cause blood pressure to go up, and cause diabetes. Also, people often gain weight when they take corticosteroids.   Cytotoxic drugs. These kill cells that cause inflammation. Sometimes, they are used if corticosteroids do not help. Other times, both medications are taken.   Surgery. This may be needed to repair a blood vessel that has bulged out (aneurysm).   Treatment can sometimes cure your disease. Other times, it can put the disease in remission (no symptoms). Increased treatment and reevaluation might be necessary if your disease comes back or flares.  HOME CARE INSTRUCTIONS  Take any medications that your caregiver prescribes. Follow the directions carefully.   Watch for any problems that can be caused by a drug (side effects). Tell your caregiver right away if you notice any changes or problems.   Keep all appointments for checkups. This is important to help your caregiver watch for side effects. Checkups may include:   Periodic blood tests.   Bone density testing. This checks how strong or weak your bones are.   Blood pressure checks. If your blood pressure rises, you may need to take a drug to control it while you are taking corticosteroids.   Blood sugar checks. This is to be sure you are not developing diabetes. If you have diabetes, corticosteroid medications may make it worse and require increased treatment.   Exercise. First, talk with your caregiver about what would be OK for you to do. Aerobic exercise (which increases your heart rate) is often suggested. It includes walking. This type of exercise is good because it helps prevent bone loss. It also helps control your blood pressure.   Follow a healthy diet. Include good sources of protein in your diet. Also include fruits, vegetables and whole grains. Your caregiver can refer you to an expert on healthy eating (dietitian) for more detailed advice.     Learn as much as you can about vasculitis. Understanding your condition can help you cope with it. Coping can be hard because this may be something you will have to live with for years.   Consider joining a support group. It often helps to talk about your worries with others who have the same problems.   Tell your caregiver if you feel stressed, anxious or depressed. Your caregiver may refer you to a specialist, or recommend medication to relieve your symptoms.  SEEK MEDICAL CARE  IF:  The symptoms that led to your diagnosis return.   You develop worsening fever, fatigue, headache, weight loss or pain in your jaw.   You develop signs of infection. Infections can be worse if you are on corticosteroid medication.   You develop any new or unexplained symptoms of disease.  SEEK IMMEDIATE MEDICAL CARE IF:  Your eyesight changes.   Pain does not go away, even after taking medication.   You feel pain in your chest or abdomen.   You have trouble breathing.   One side of your face or body becomes suddenly weak or numb.   Your nose bleeds.   There is blood in your urine.   You develop a fever of more than 100.5.  Document Released: 01/24/2009  [facility] Patient Information 2011 [place], [place].

## 2010-12-26 LAB — C-REACTIVE PROTEIN: CRP: 1.1 mg/dL — ABNORMAL HIGH (ref <0.60)

## 2011-01-02 LAB — COMPREHENSIVE METABOLIC PANEL
AST: 19
CO2: 25
Calcium: 8.9
Creatinine, Ser: 0.85
GFR calc Af Amer: >60
GFR calc non Af Amer: >60
Total Protein: 7.4

## 2011-01-02 LAB — LIPID PANEL
Cholesterol: 131
HDL: 38 — ABNORMAL LOW
LDL Cholesterol: 81
Total CHOL/HDL Ratio: 3.4

## 2011-01-02 LAB — URINALYSIS, ROUTINE W REFLEX MICROSCOPIC
Ketones, ur: NEGATIVE
Nitrite: NEGATIVE
Protein, ur: NEGATIVE

## 2011-01-02 LAB — I-STAT 8, (EC8 V) (CONVERTED LAB)
BUN: 9
Chloride: 106
Glucose, Bld: 96
Hemoglobin: 15
Potassium: 3.7
Sodium: 139
pH, Ven: 7.375 — ABNORMAL HIGH

## 2011-01-02 LAB — HEMOGLOBIN A1C
Hgb A1c MFr Bld: 6.2 — ABNORMAL HIGH
Mean Plasma Glucose: 143

## 2011-01-02 LAB — LUPUS ANTICOAGULANT PANEL
DRVVT: 38.5 (ref 36.1–47.0)
Lupus Anticoagulant: NOT DETECTED

## 2011-01-02 LAB — BETA-2-GLYCOPROTEIN I ABS, IGG/M/A
Beta-2 Glyco I IgG: 7 U/mL (ref <20)
Beta-2-Glycoprotein I IgM: <4 U/mL (ref <10)

## 2011-01-02 LAB — PROTEIN C ACTIVITY: Protein C Activity: 200 % — ABNORMAL HIGH (ref 75–133)

## 2011-01-02 LAB — C3 COMPLEMENT: C3 Complement: 192

## 2011-01-02 LAB — DIFFERENTIAL
Eosinophils Relative: 2
Lymphocytes Relative: 29
Lymphs Abs: 2.2

## 2011-01-02 LAB — ANA: Anti Nuclear Antibody(ANA): NEGATIVE

## 2011-01-02 LAB — CBC
MCV: 81.8
Platelets: 239
WBC: 7.8

## 2011-01-02 LAB — C4 COMPLEMENT: Complement C4, Body Fluid: 28

## 2011-01-02 LAB — HOMOCYSTEINE: Homocysteine: 6.4

## 2011-01-02 LAB — TROPONIN I: Troponin I: 0.01

## 2011-01-02 LAB — CK TOTAL AND CKMB (NOT AT ARMC)
CK, MB: 0.8
Relative Index: 0.7
Total CK: 118

## 2011-01-07 ENCOUNTER — Ambulatory Visit (INDEPENDENT_AMBULATORY_CARE_PROVIDER_SITE_OTHER): Payer: 59 | Admitting: Internal Medicine

## 2011-01-07 ENCOUNTER — Encounter: Payer: Self-pay | Admitting: Internal Medicine

## 2011-01-07 VITALS — BP 134/78 | HR 81 | Temp 97.9°F | Resp 16 | Wt 260.0 lb

## 2011-01-07 DIAGNOSIS — I10 Essential (primary) hypertension: Secondary | ICD-10-CM

## 2011-01-07 DIAGNOSIS — M300 Polyarteritis nodosa: Secondary | ICD-10-CM

## 2011-01-07 DIAGNOSIS — Z23 Encounter for immunization: Secondary | ICD-10-CM

## 2011-01-07 DIAGNOSIS — E669 Obesity, unspecified: Secondary | ICD-10-CM

## 2011-01-07 NOTE — Progress Notes (Signed)
  Subjective:    Patient ID: [name], female    DOB: May 21, 1965, 45 y.o.   MRN: [id number]  HPI She returns for f/up and she tells me that the nodules on her legs have resolved. Today, she wants for me to refer her to nutrition so that she can get some info about lap-band and to start a monitored diet program.   Review of Systems  Constitutional: Negative.   HENT: Negative.   Eyes: Negative.   Respiratory: Negative.   Cardiovascular: Negative.   Gastrointestinal: Negative.   Genitourinary: Negative.   Musculoskeletal: Negative.   Skin: Negative.   Neurological: Negative.   Hematological: Negative.   Psychiatric/Behavioral: Negative.        Objective:   Physical Exam  Vitals reviewed. Constitutional: She is oriented to person, place, and time. She appears well-developed and well-nourished. No distress.  HENT:  Mouth/Throat: Oropharynx is clear and moist. No oropharyngeal exudate.  Eyes: Conjunctivae are normal. Right eye exhibits no discharge. Left eye exhibits no discharge. No scleral icterus.  Neck: Normal range of motion. Neck supple. No JVD present. No tracheal deviation present. No thyromegaly present.  Cardiovascular: Normal rate, regular rhythm, normal heart sounds and intact distal pulses.  Exam reveals no gallop and no friction rub.   No murmur heard. Pulmonary/Chest: Effort normal and breath sounds normal. No stridor. No respiratory distress. She has no wheezes. She has no rales. She exhibits no tenderness.  Abdominal: Soft. Bowel sounds are normal. She exhibits no distension and no mass. There is no tenderness. There is no rebound and no guarding.  Musculoskeletal: Normal range of motion. She exhibits no edema and no tenderness.  Lymphadenopathy:    She has no cervical adenopathy.  Neurological: She is oriented to person, place, and time. She displays normal reflexes. No cranial nerve deficit. She exhibits normal muscle tone. Coordination normal.  Skin: Skin  is warm and dry. No rash noted. She is not diaphoretic. No erythema. No pallor.  Psychiatric: She has a normal mood and affect. Her behavior is normal. Judgment and thought content normal.      Lab Results  Component Value Date   WBC 6.9 12/25/2010   HGB 13.3 12/25/2010   HCT 40.0 12/25/2010   PLT 257.0 12/25/2010   CHOL 149 12/25/2010   TRIG 62.0 12/25/2010   HDL 57.50 12/25/2010   ALT 19 12/25/2010   AST 22 12/25/2010   NA 140 12/25/2010   K 4.7 12/25/2010   CL 105 12/25/2010   CREATININE 1.0 12/25/2010   BUN 12 12/25/2010   CO2 30 12/25/2010   TSH 2.18 12/25/2010   INR 1.0 05/25/2007   HGBA1C 6.0 12/25/2010      Assessment & Plan:

## 2011-01-07 NOTE — Assessment & Plan Note (Addendum)
This has improved, will observe for now

## 2011-01-07 NOTE — Assessment & Plan Note (Signed)
BP is well controlled 

## 2011-01-07 NOTE — Assessment & Plan Note (Signed)
Nutrition referral.

## 2011-01-07 NOTE — Patient Instructions (Signed)

## 2011-01-22 ENCOUNTER — Encounter: Payer: Self-pay | Admitting: *Deleted

## 2011-01-22 ENCOUNTER — Encounter: Payer: 59 | Attending: Internal Medicine | Admitting: *Deleted

## 2011-01-22 DIAGNOSIS — Z713 Dietary counseling and surveillance: Secondary | ICD-10-CM | POA: Insufficient documentation

## 2011-01-22 DIAGNOSIS — E669 Obesity, unspecified: Secondary | ICD-10-CM | POA: Insufficient documentation

## 2011-01-22 NOTE — Patient Instructions (Addendum)
Goals:  Eat 3 meals/day, Avoid meal skipping.   Increase protein rich foods to all meals and snacks.  Limit carbohydrate to 45 g/meal and 15 g/snack.   Choose more whole grains, lean protein, low-fat dairy, and fruits/non-starchy vegetables.   Aim for >30 min of physical activity most days - or 3 times/week for 1 hour.  Limit sugar-sweetened beverages, concentrated sweets, and high fat/fried foods.  Aim for 2000 mg or less of sodium daily.

## 2011-01-22 NOTE — Progress Notes (Signed)
  Medical Nutrition Therapy:  Appt start time: 1500 end time:  1600.  Assessment:  Primary concerns today: Obesity/Weight Loss.  Pt interested in lap band surgery and recently attended seminar at [facility] per MD recommendation.  Starting supervised weight loss with this visit. Highly excessive CHO, fat, and sodium intake.  Reports vasculitis ("also called polyarthritis") in R leg which sometimes gives her trouble when exercising. Pt eager to try losing weight on her own, but still wants information regarding surgery. Sent Pre-Op goals.   MEDICATIONS: Benicar HCT, [facility] Ibuprofen (pen)   DIETARY INTAKE:  Usual eating pattern includes 2-3 meals and 1-2 snacks per day.  24-hr recall:  B (7:30 AM): Sausage Mcmuffin; sweet tea OR poptart (blueberry frosted); water  Snk (AM): none  L ( PM): Leftovers OR out for a salad Snk ( PM): fudge stripe cookies (Keebler) D (8-9 to 10 PM): [employer] Chili (Lg) w/ sour cream and nuggets (5); water   Snk ( PM): water; sometimes something sweet Beverages: water (~70 oz/day), sweet tea, soda (2x/wk)  Usual physical activity: None  Estimated energy needs: 1200 calories 150 g carbohydrates 75 g protein 35-40 g fat  Progress Towards Goal(s):  New.   Nutritional Diagnosis:  [place]-3.3 Morbid obesity related to excessive intake of CHO, fat, and sodium as evidenced by patient reported food recall and a BMI of 49.6 kg/m^2.    Intervention/Goals:  Eat 3 meals/day, Avoid meal skipping.   Increase protein rich foods to all meals and snacks.  Limit carbohydrate to 45 g/meal and 15 g/snack.   Choose more whole grains, lean protein, low-fat dairy, and fruits/non-starchy vegetables.   Aim for >30 min of physical activity most days; or 3 times/week for 1 hour.  Limit sugar-sweetened beverages, concentrated sweets, and high fat/fried foods.  Aim for 2000 mg or less of sodium daily.  Handouts given during visit include:  Bariatric Pre-Op  goals  Monitoring/Evaluation:  Dietary intake, exercise, and body weight in 4 week(s).

## 2011-01-26 LAB — COMPREHENSIVE METABOLIC PANEL
ALT: 18
ALT: 21
AST: 22
AST: 22
Albumin: 3.4 — ABNORMAL LOW
CO2: 24
CO2: 26
Calcium: 9
Calcium: 9
Chloride: 107
GFR calc Af Amer: >60
GFR calc Af Amer: >60
GFR calc non Af Amer: >60
Glucose, Bld: 96
Sodium: 137
Sodium: 137
Total Bilirubin: 0.4
Total Protein: 6.7

## 2011-01-26 LAB — LIPID PANEL
HDL: 46
LDL Cholesterol: 84
Total CHOL/HDL Ratio: 3.2
Triglycerides: 86
VLDL: 17

## 2011-01-26 LAB — CK TOTAL AND CKMB (NOT AT ARMC)
Relative Index: 0.7
Total CK: 117
Total CK: 118

## 2011-01-26 LAB — CBC
HCT: 41.4
MCHC: 33.4
MCV: 81
MCV: 81.4
Platelets: 265
RBC: 4.73
RBC: 5.11
WBC: 7.7

## 2011-01-26 LAB — APTT: aPTT: 33

## 2011-01-26 LAB — URINALYSIS, ROUTINE W REFLEX MICROSCOPIC
Glucose, UA: NEGATIVE
Nitrite: NEGATIVE
pH: 7

## 2011-01-26 LAB — HEMOGLOBIN A1C: Hgb A1c MFr Bld: 6

## 2011-01-26 LAB — DIFFERENTIAL
Eosinophils Absolute: 0.2
Eosinophils Relative: 3
Lymphs Abs: 2.9
Monocytes Absolute: 0.6
Monocytes Relative: 7

## 2011-01-26 LAB — RAPID URINE DRUG SCREEN, HOSP PERFORMED
Barbiturates: NOT DETECTED
Benzodiazepines: NOT DETECTED
Cocaine: NOT DETECTED

## 2011-01-26 LAB — TROPONIN I: Troponin I: 0.02

## 2011-01-26 LAB — HOMOCYSTEINE: Homocysteine: 6.5

## 2011-02-24 ENCOUNTER — Ambulatory Visit: Payer: 59 | Admitting: *Deleted

## 2011-03-17 ENCOUNTER — Ambulatory Visit: Payer: 59 | Admitting: Internal Medicine

## 2011-03-26 ENCOUNTER — Encounter: Payer: 59 | Attending: Internal Medicine | Admitting: *Deleted

## 2011-03-26 ENCOUNTER — Ambulatory Visit (INDEPENDENT_AMBULATORY_CARE_PROVIDER_SITE_OTHER): Payer: 59 | Admitting: Internal Medicine

## 2011-03-26 ENCOUNTER — Encounter: Payer: Self-pay | Admitting: Internal Medicine

## 2011-03-26 ENCOUNTER — Encounter: Payer: Self-pay | Admitting: *Deleted

## 2011-03-26 VITALS — BP 120/73 | HR 86 | Temp 98.8°F | Resp 16 | Wt 271.0 lb

## 2011-03-26 VITALS — Ht 61.0 in | Wt 270.9 lb

## 2011-03-26 DIAGNOSIS — I10 Essential (primary) hypertension: Secondary | ICD-10-CM

## 2011-03-26 DIAGNOSIS — L309 Dermatitis, unspecified: Secondary | ICD-10-CM

## 2011-03-26 DIAGNOSIS — E669 Obesity, unspecified: Secondary | ICD-10-CM

## 2011-03-26 DIAGNOSIS — Z713 Dietary counseling and surveillance: Secondary | ICD-10-CM | POA: Insufficient documentation

## 2011-03-26 DIAGNOSIS — M300 Polyarteritis nodosa: Secondary | ICD-10-CM

## 2011-03-26 DIAGNOSIS — Z23 Encounter for immunization: Secondary | ICD-10-CM

## 2011-03-26 DIAGNOSIS — L259 Unspecified contact dermatitis, unspecified cause: Secondary | ICD-10-CM

## 2011-03-26 MED ORDER — HYDROCORTISONE 2.5 % RE CREA: TOPICAL_CREAM | Freq: Two times a day (BID) | RECTAL | Status: AC

## 2011-03-26 NOTE — Patient Instructions (Signed)
Hypertension As your heart beats, it forces blood through your arteries. This force is your blood pressure. If the pressure is too high, it is called hypertension (HTN) or high blood pressure. HTN is dangerous because you may have it and not know it. High blood pressure may mean that your heart has to work harder to pump blood. Your arteries may be narrow or stiff. The extra work puts you at risk for heart disease, stroke, and other problems.  Blood pressure consists of two numbers, a higher number over a lower, 110/72, for example. It is stated as "110 over 72." The ideal is below 120 for the top number (systolic) and under 80 for the bottom (diastolic). Write down your blood pressure today. You should pay close attention to your blood pressure if you have certain conditions such as:  Heart failure.   Prior heart attack.   Diabetes   Chronic kidney disease.   Prior stroke.   Multiple risk factors for heart disease.  To see if you have HTN, your blood pressure should be measured while you are seated with your arm held at the level of the heart. It should be measured at least twice. A one-time elevated blood pressure reading (especially in the Emergency Department) does not mean that you need treatment. There may be conditions in which the blood pressure is different between your right and left arms. It is important to see your caregiver soon for a recheck. Most people have essential hypertension which means that there is not a specific cause. This type of high blood pressure may be lowered by changing lifestyle factors such as:  Stress.   Smoking.   Lack of exercise.   Excessive weight.   Drug/tobacco/alcohol use.   Eating less salt.  Most people do not have symptoms from high blood pressure until it has caused damage to the body. Effective treatment can often prevent, delay or reduce that damage. TREATMENT  When a cause has been identified, treatment for high blood pressure is  directed at the cause. There are a large number of medications to treat HTN. These fall into several categories, and your caregiver will help you select the medicines that are best for you. Medications may have side effects. You should review side effects with your caregiver. If your blood pressure stays high after you have made lifestyle changes or started on medicines,   Your medication(s) may need to be changed.   Other problems may need to be addressed.   Be certain you understand your prescriptions, and know how and when to take your medicine.   Be sure to follow up with your caregiver within the time frame advised (usually within two weeks) to have your blood pressure rechecked and to review your medications.   If you are taking more than one medicine to lower your blood pressure, make sure you know how and at what times they should be taken. Taking two medicines at the same time can result in blood pressure that is too low.  SEEK IMMEDIATE MEDICAL CARE IF:  You develop a severe headache, blurred or changing vision, or confusion.   You have unusual weakness or numbness, or a faint feeling.   You have severe chest or abdominal pain, vomiting, or breathing problems.  MAKE SURE YOU:   Understand these instructions.   Will watch your condition.   Will get help right away if you are not doing well or get worse.  Document Released: 03/30/2005 Document Revised: 12/10/2010 Document Reviewed:   11/18/2007 [employer] Patient Information 2012 [place], [place].Eczema Atopic dermatitis, or eczema, is an inherited type of sensitive skin. Often people with eczema have a family history of allergies, asthma, or hay fever. It causes a red itchy rash and dry scaly skin. The itchiness may occur before the skin rash and may be very intense. It is not contagious. Eczema is generally worse during the cooler winter months and often improves with the warmth of summer. Eczema usually starts showing signs in  infancy. Some children outgrow eczema, but it may last through adulthood. Flare-ups may be caused by:  Eating something or contact with something you are sensitive or allergic to.   Stress.  DIAGNOSIS  The diagnosis of eczema is usually based upon symptoms and medical history. TREATMENT  Eczema cannot be cured, but symptoms usually can be controlled with treatment or avoidance of allergens (things to which you are sensitive or allergic to).  Controlling the itching and scratching.   Use over-the-counter antihistamines as directed for itching. It is especially useful at night when the itching tends to be worse.   Use over-the-counter steroid creams as directed for itching.   Scratching makes the rash and itching worse and may cause impetigo (a skin infection) if fingernails are contaminated (dirty).   Keeping the skin well moisturized with creams every day. This will seal in moisture and help prevent dryness. Lotions containing alcohol and water can dry the skin and are not recommended.   Limiting exposure to allergens.   Recognizing situations that cause stress.   Developing a plan to manage stress.  HOME CARE INSTRUCTIONS   Take prescription and over-the-counter medicines as directed by your caregiver.   Do not use anything on the skin without checking with your caregiver.   Keep baths or showers short (5 minutes) in warm (not hot) water. Use mild cleansers for bathing. You may add non-perfumed bath oil to the bath water. It is best to avoid soap and bubble bath.   Immediately after a bath or shower, when the skin is still damp, apply a moisturizing ointment to the entire body. This ointment should be a petroleum ointment. This will seal in moisture and help prevent dryness. The thicker the ointment the better. These should be unscented.   Keep fingernails cut short and wash hands often. If your child has eczema, it may be necessary to put soft gloves or mittens on your child at  night.   Dress in clothes made of cotton or cotton blends. Dress lightly, as heat increases itching.   Avoid foods that may cause flare-ups. Common foods include cow's milk, peanut butter, eggs and wheat.   Keep a child with eczema away from anyone with fever blisters. The virus that causes fever blisters (herpes simplex) can cause a serious skin infection in children with eczema.  SEEK MEDICAL CARE IF:   Itching interferes with sleep.   The rash gets worse or is not better within one week following treatment.   The rash looks infected (pus or soft yellow scabs).   You or your child has an oral temperature above 102 F (38.9 C).   Your baby is older than 3 months with a rectal temperature of 100.5 F (38.1 C) or higher for more than 1 day.   The rash flares up after contact with someone who has fever blisters.  SEEK IMMEDIATE MEDICAL CARE IF:   Your baby is older than 3 months with a rectal temperature of 102 F (38.9 C) or higher.  Your baby is older than 3 months or younger with a rectal temperature of 100.4 F (38 C) or higher.  Document Released: 03/27/2000 Document Revised: 12/10/2010 Document Reviewed: 01/30/2009 [facility] Patient Information 2012 [place], [place].

## 2011-03-26 NOTE — Progress Notes (Signed)
Subjective:    Patient ID: [name], female    DOB: 1966/02/24, 45 y.o.   MRN: [id number]  Hypertension This is a chronic problem. The current episode started more than 1 year ago. The problem has been gradually improving since onset. The problem is controlled. Pertinent negatives include no anxiety, blurred vision, chest pain, headaches, malaise/fatigue, neck pain, orthopnea, palpitations, peripheral edema, PND, shortness of breath or sweats. Past treatments include angiotensin blockers and diuretics. Compliance problems include exercise and diet.       Review of Systems  Constitutional: Negative for fever, chills, malaise/fatigue, diaphoresis, activity change, appetite change, fatigue and unexpected weight change.  HENT: Negative.  Negative for neck pain.   Eyes: Negative.  Negative for blurred vision.  Respiratory: Negative for cough, chest tightness, shortness of breath, wheezing and stridor.   Cardiovascular: Negative for chest pain, palpitations, orthopnea, leg swelling and PND.  Gastrointestinal: Negative for nausea, abdominal pain, diarrhea, constipation and blood in stool.  Genitourinary: Negative.   Musculoskeletal: Negative for myalgias, back pain, joint swelling, arthralgias and gait problem.  Skin: Positive for rash (itchy spot on left breast). Negative for color change and wound.  Neurological: Negative for dizziness, tremors, seizures, syncope, facial asymmetry, speech difficulty, weakness, light-headedness, numbness and headaches.  Hematological: Negative for adenopathy. Does not bruise/bleed easily.  Psychiatric/Behavioral: Negative.        Objective:   Physical Exam  Vitals reviewed. Constitutional: She is oriented to person, place, and time. She appears well-developed and well-nourished. No distress.  HENT:  Head: Normocephalic and atraumatic.  Mouth/Throat: Oropharynx is clear and moist. No oropharyngeal exudate.  Eyes: Conjunctivae are normal. Right eye  exhibits no discharge. Left eye exhibits no discharge. No scleral icterus.  Neck: Normal range of motion. Neck supple. No JVD present. No tracheal deviation present. No thyromegaly present.  Cardiovascular: Normal rate, regular rhythm, normal heart sounds and intact distal pulses.  Exam reveals no gallop and no friction rub.   No murmur heard. Pulmonary/Chest: Effort normal and breath sounds normal. No stridor. No respiratory distress. She has no wheezes. She has no rales. She exhibits no tenderness.  Abdominal: Soft. Bowel sounds are normal. She exhibits no distension and no mass. There is no tenderness. There is no rebound and no guarding.  Musculoskeletal: Normal range of motion. She exhibits no edema and no tenderness.  Lymphadenopathy:    She has no cervical adenopathy.  Neurological: She is oriented to person, place, and time.  Skin: Skin is warm, dry and intact. Rash noted. No abrasion, no bruising, no burn, no ecchymosis, no laceration, no lesion, no petechiae and no purpura noted. Rash is papular. Rash is not macular, not nodular, not pustular, not vesicular and not urticarial. She is not diaphoretic. No erythema. No pallor.     Psychiatric: She has a normal mood and affect. Her behavior is normal. Judgment and thought content normal.      Lab Results  Component Value Date   WBC 6.9 12/25/2010   HGB 13.3 12/25/2010   HCT 40.0 12/25/2010   PLT 257.0 12/25/2010   GLUCOSE 97 12/25/2010   CHOL 149 12/25/2010   TRIG 62.0 12/25/2010   HDL 57.50 12/25/2010   LDLCALC 79 12/25/2010   ALT 19 12/25/2010   AST 22 12/25/2010   NA 140 12/25/2010   K 4.7 12/25/2010   CL 105 12/25/2010   CREATININE 1.0 12/25/2010   BUN 12 12/25/2010   CO2 30 12/25/2010   TSH 2.18 12/25/2010   INR  1.0 05/25/2007   HGBA1C 6.0 12/25/2010      Assessment & Plan:

## 2011-03-26 NOTE — Assessment & Plan Note (Signed)
Her BP is well controlled 

## 2011-03-26 NOTE — Progress Notes (Signed)
Medical Nutrition Therapy:  Appt start time: 1600 end time:  1630.  Primary concerns today: Obesity/Weight Loss; Follow up.  Pt here for follow up.  No longer interested in LBS and states "carb counting is not working for me". Continues to eat excessive CHO, fat, and sodium at meals.  Excessive simple sugar intake through sweet tea and refined CHO.  No exercise noted.  Pt states she wants to try Weight Watchers in conjunction with these visits and plans to start her whole program over in January.  Will follow up pt after 1 month on [employer] program.   MEDICATIONS: See medication list; reconciled with pt.   DIETARY INTAKE:  Usual eating pattern includes 3 meals and 1-2 snacks per day.  24-hr recall:  B (7:30 AM): Egg, cheese, bratwurst sandwich; [employer] sweet tea  Snk (AM): none  L ( PM): Spaghetti w/ wheat noodles and meat sauce (80/20%); water Snk ( PM):  "huge" doughnut (white icing); water D (8-9 to 10 PM): Pancakes, bacon, eggs; Sweet tea   Snk ( PM): water; creamsicle Beverages: water (~25-30 oz/day), sweet tea, soda (2x/wk)  Usual physical activity:  Not recently - plans to "start over" in January 2013.  Estimated energy needs: 1200 calories 150 g carbohydrates 75 g protein 35-40 g fat  Progress Towards Goal(s):  No progress noted; Continue previous goals.   Nutritional Diagnosis:  [place]-3.3 Morbid obesity related to excessive intake of CHO, fat, and sodium as evidenced by patient reported food recall and a BMI of 49.6 kg/m^2.    Intervention/Goals:  Continue previous goals.  Research Weight Watchers program.  Call with any questions or concerns.  Monitoring/Evaluation:  Dietary intake, exercise, and body weight in 6 week(s).

## 2011-03-26 NOTE — Assessment & Plan Note (Signed)
NED at this time 

## 2011-03-26 NOTE — Assessment & Plan Note (Signed)
She will try a topical steroid cream

## 2011-03-27 ENCOUNTER — Encounter: Payer: Self-pay | Admitting: *Deleted

## 2011-03-27 NOTE — Patient Instructions (Addendum)
Goals:  Continue previous goals.  Research Weight [employer]chers program.  Call with any questions or concerns.

## 2011-04-02 ENCOUNTER — Telehealth: Payer: Self-pay

## 2011-04-02 DIAGNOSIS — L309 Dermatitis, unspecified: Secondary | ICD-10-CM

## 2011-04-02 DIAGNOSIS — I10 Essential (primary) hypertension: Secondary | ICD-10-CM

## 2011-04-02 MED ORDER — OLMESARTAN MEDOXOMIL-HCTZ 40-25 MG PO TABS: 1.0000 | ORAL_TABLET | Freq: Every day | ORAL | Status: DC

## 2011-04-02 NOTE — Telephone Encounter (Signed)
Patient would like to know if diuretic should be increased. Thanks

## 2011-04-02 NOTE — Telephone Encounter (Signed)
Patient notified/LMOVM to check pharmacy

## 2011-04-02 NOTE — Telephone Encounter (Signed)
Patient called c/o continued bloating and fluid retention. She was seen 12/13 and would like for MD to send in rx for a diuretic.

## 2011-04-02 NOTE — Telephone Encounter (Signed)
done

## 2011-04-02 NOTE — Telephone Encounter (Signed)
She is already on a diuretic

## 2011-04-16 ENCOUNTER — Ambulatory Visit (INDEPENDENT_AMBULATORY_CARE_PROVIDER_SITE_OTHER)
Admission: RE | Admit: 2011-04-16 | Discharge: 2011-04-16 | Disposition: A | Discharge status: Home / Self Care | Payer: 59 | Source: Ambulatory Visit | Attending: Internal Medicine | Admitting: Internal Medicine

## 2011-04-16 ENCOUNTER — Encounter: Payer: Self-pay | Admitting: Internal Medicine

## 2011-04-16 ENCOUNTER — Ambulatory Visit (INDEPENDENT_AMBULATORY_CARE_PROVIDER_SITE_OTHER): Payer: 59 | Admitting: Internal Medicine

## 2011-04-16 VITALS — BP 110/70 | HR 90 | Temp 97.2°F | Resp 16

## 2011-04-16 DIAGNOSIS — M545 Low back pain: Secondary | ICD-10-CM

## 2011-04-16 MED ORDER — METHOCARBAMOL 500 MG PO TABS: 500.0000 mg | ORAL_TABLET | Freq: Four times a day (QID) | ORAL | Status: AC

## 2011-04-16 MED ORDER — NAPROXEN 500 MG PO TABS: 500.0000 mg | ORAL_TABLET | Freq: Two times a day (BID) | ORAL | Status: AC

## 2011-04-16 NOTE — Progress Notes (Signed)
Subjective:    Patient ID: [name], female    DOB: 12-12-1965, 46 y.o.   MRN: [id number]  Back Pain This is a new problem. The current episode started in the past 7 days. The problem occurs intermittently. The problem is unchanged. The pain is present in the lumbar spine. The quality of the pain is described as aching. The pain radiates to the right thigh. The pain is at a severity of 2/10. The pain is moderate. The pain is worse during the day. The symptoms are aggravated by bending. Stiffness is present in the morning. Pertinent negatives include no abdominal pain, bladder incontinence, bowel incontinence, chest pain, dysuria, fever, headaches, leg pain, numbness, paresis, paresthesias, pelvic pain, perianal numbness, tingling, weakness or weight loss. Risk factors include history of steroid use. She has tried NSAIDs for the symptoms. The treatment provided mild relief.      Review of Systems  Constitutional: Negative for fever, chills, weight loss, diaphoresis, activity change, appetite change, fatigue and unexpected weight change.  HENT: Negative.   Eyes: Negative.   Respiratory: Negative for cough, chest tightness, shortness of breath, wheezing and stridor.   Cardiovascular: Negative for chest pain, palpitations and leg swelling.  Gastrointestinal: Negative for nausea, vomiting, abdominal pain, diarrhea, constipation and bowel incontinence.  Genitourinary: Negative.  Negative for bladder incontinence, dysuria and pelvic pain.  Musculoskeletal: Positive for back pain. Negative for myalgias, joint swelling, arthralgias and gait problem.  Skin: Negative for color change, pallor, rash and wound.  Neurological: Negative for dizziness, tingling, tremors, seizures, syncope, facial asymmetry, speech difficulty, weakness, light-headedness, numbness, headaches and paresthesias.  Hematological: Negative for adenopathy. Does not bruise/bleed easily.  Psychiatric/Behavioral: Negative.         Objective:   Physical Exam  Vitals reviewed. Constitutional: She is oriented to person, place, and time. She appears well-developed and well-nourished. No distress.  HENT:  Head: Normocephalic and atraumatic.  Mouth/Throat: Oropharynx is clear and moist. No oropharyngeal exudate.  Eyes: Conjunctivae are normal. Right eye exhibits no discharge. Left eye exhibits no discharge. No scleral icterus.  Neck: Normal range of motion. Neck supple. No JVD present. No tracheal deviation present. No thyromegaly present.  Cardiovascular: Normal rate, regular rhythm, normal heart sounds and intact distal pulses.  Exam reveals no gallop and no friction rub.   No murmur heard. Pulmonary/Chest: Effort normal and breath sounds normal. No stridor. No respiratory distress. She has no wheezes. She has no rales. She exhibits no tenderness.  Abdominal: Soft. Bowel sounds are normal. She exhibits no distension and no mass. There is no tenderness. There is no rebound and no guarding.  Musculoskeletal: Normal range of motion. She exhibits no edema and no tenderness.       Lumbar back: Normal. She exhibits normal range of motion, no tenderness, no bony tenderness, no swelling, no edema, no deformity, no laceration, no pain, no spasm and normal pulse.  Lymphadenopathy:    She has no cervical adenopathy.  Neurological: She is alert and oriented to person, place, and time. She has normal strength. She displays normal reflexes. No cranial nerve deficit or sensory deficit. She displays a negative Romberg sign. Coordination and gait normal. She displays no Babinski's sign on the right side. She displays no Babinski's sign on the left side.  Reflex Scores:      Tricep reflexes are 1+ on the right side and 1+ on the left side.      Bicep reflexes are 1+ on the right side and 1+ on  the left side.      Brachioradialis reflexes are 1+ on the right side and 1+ on the left side.      Patellar reflexes are 1+ on the right  side and 1+ on the left side.      Achilles reflexes are 1+ on the right side and 1+ on the left side.      -SLR in both legs  Skin: Skin is warm and dry. No rash noted. She is not diaphoretic. No erythema. No pallor.  Psychiatric: She has a normal mood and affect. Her behavior is normal. Judgment and thought content normal.          Assessment & Plan:

## 2011-04-16 NOTE — Patient Instructions (Signed)

## 2011-04-16 NOTE — Assessment & Plan Note (Signed)
I will check a plain xray to look for vertebral fracture in light of the history of steroid use and have given her Rx's for pain and wrote a work note

## 2011-05-01 ENCOUNTER — Ambulatory Visit (INDEPENDENT_AMBULATORY_CARE_PROVIDER_SITE_OTHER): Payer: 59 | Admitting: Internal Medicine

## 2011-05-01 ENCOUNTER — Encounter: Payer: Self-pay | Admitting: Internal Medicine

## 2011-05-01 DIAGNOSIS — I10 Essential (primary) hypertension: Secondary | ICD-10-CM

## 2011-05-01 DIAGNOSIS — M545 Low back pain: Secondary | ICD-10-CM

## 2011-05-01 NOTE — Patient Instructions (Signed)
Back Pain, Adult Low back pain is very common. About 1 in 5 people have back pain.The cause of low back pain is rarely dangerous. The pain often gets better over time.About half of people with a sudden onset of back pain feel better in just 2 weeks. About 8 in 10 people feel better by 6 weeks.  CAUSES Some common causes of back pain include:  Strain of the muscles or ligaments supporting the spine.   Wear and tear (degeneration) of the spinal discs.   Arthritis.   Direct injury to the back.  DIAGNOSIS Most of the time, the direct cause of low back pain is not known.However, back pain can be treated effectively even when the exact cause of the pain is unknown.Answering your caregiver's questions about your overall health and symptoms is one of the most accurate ways to make sure the cause of your pain is not dangerous. If your caregiver needs more information, he or she may order lab work or imaging tests (X-rays or MRIs).However, even if imaging tests show changes in your back, this usually does not require surgery. HOME CARE INSTRUCTIONS For many people, back pain returns.Since low back pain is rarely dangerous, it is often a condition that people can learn to manageon their own.   Remain active. It is stressful on the back to sit or stand in one place. Do not sit, drive, or stand in one place for more than 30 minutes at a time. Take short walks on level surfaces as soon as pain allows.Try to increase the length of time you walk each day.   Do not stay in bed.Resting more than 1 or 2 days can delay your recovery.   Do not avoid exercise or work.Your body is made to move.It is not dangerous to be active, even though your back may hurt.Your back will likely heal faster if you return to being active before your pain is gone.   Pay attention to your body when you bend and lift. Many people have less discomfortwhen lifting if they bend their knees, keep the load close to their  bodies,and avoid twisting. Often, the most comfortable positions are those that put less stress on your recovering back.   Find a comfortable position to sleep. Use a firm mattress and lie on your side with your knees slightly bent. If you lie on your back, put a pillow under your knees.   Only take over-the-counter or prescription medicines as directed by your caregiver. Over-the-counter medicines to reduce pain and inflammation are often the most helpful.Your caregiver may prescribe muscle relaxant drugs.These medicines help dull your pain so you can more quickly return to your normal activities and healthy exercise.   Put ice on the injured area.   Put ice in a plastic bag.   Place a towel between your skin and the bag.   Leave the ice on for 15 to 20 minutes, 3 to 4 times a day for the first 2 to 3 days. After that, ice and heat may be alternated to reduce pain and spasms.   Ask your caregiver about trying back exercises and gentle massage. This may be of some benefit.   Avoid feeling anxious or stressed.Stress increases muscle tension and can worsen back pain.It is important to recognize when you are anxious or stressed and learn ways to manage it.Exercise is a great option.  SEEK MEDICAL CARE IF:  You have pain that is not relieved with rest or medicine.   You have   pain that does not improve in 1 week.   You have new symptoms.   You are generally not feeling well.  SEEK IMMEDIATE MEDICAL CARE IF:   You have pain that radiates from your back into your legs.   You develop new bowel or bladder control problems.   You have unusual weakness or numbness in your arms or legs.   You develop nausea or vomiting.   You develop abdominal pain.   You feel faint.  Document Released: 03/30/2005 Document Revised: 12/10/2010 Document Reviewed: 08/18/2010 [employer] Patient Information 2012 [employer]. 

## 2011-05-03 ENCOUNTER — Encounter: Payer: Self-pay | Admitting: Internal Medicine

## 2011-05-03 NOTE — Assessment & Plan Note (Signed)
Her xray showed some DDD and now I am concerned that she may have spinal stenosis or nerve impingement so I have asked her to get an MRI done, she will continue nsaids for pain relief

## 2011-05-03 NOTE — Assessment & Plan Note (Signed)
Her BP is well controlled 

## 2011-05-03 NOTE — Progress Notes (Signed)
Subjective:    Patient ID: [name], female    DOB: 09/28/1965, 46 y.o.   MRN: [id number]  Back Pain This is a recurrent problem. The current episode started more than 1 month ago. The problem occurs intermittently. The problem has been gradually improving since onset. The pain is present in the lumbar spine and gluteal. The quality of the pain is described as burning. The pain radiates to the left thigh and right thigh. The pain is at a severity of 3/10. The pain is mild. The pain is worse during the day. The symptoms are aggravated by bending. Stiffness is present in the morning. Associated symptoms include numbness. Pertinent negatives include no abdominal pain, bladder incontinence, bowel incontinence, chest pain, dysuria, fever, headaches, leg pain, paresis, paresthesias, pelvic pain, perianal numbness, tingling, weakness or weight loss. Risk factors include menopause and obesity. She has tried NSAIDs for the symptoms. The treatment provided moderate relief.  Hypertension This is a chronic problem. The current episode started more than 1 year ago. The problem has been gradually improving since onset. The problem is uncontrolled. Pertinent negatives include no anxiety, blurred vision, chest pain, headaches, malaise/fatigue, neck pain, orthopnea, palpitations, peripheral edema, PND, shortness of breath or sweats. Past treatments include angiotensin blockers and diuretics. The current treatment provides significant improvement.      Review of Systems  Constitutional: Negative for fever, chills, weight loss, malaise/fatigue, diaphoresis, activity change, appetite change, fatigue and unexpected weight change.  HENT: Negative.  Negative for neck pain.   Eyes: Negative for blurred vision.  Respiratory: Negative for cough, chest tightness, shortness of breath, wheezing and stridor.   Cardiovascular: Negative for chest pain, palpitations, orthopnea, leg swelling and PND.  Gastrointestinal:  Negative for nausea, vomiting, abdominal pain, diarrhea, constipation, blood in stool, abdominal distention, anal bleeding, rectal pain and bowel incontinence.  Genitourinary: Negative.  Negative for bladder incontinence, dysuria and pelvic pain.  Musculoskeletal: Positive for back pain. Negative for myalgias, joint swelling, arthralgias and gait problem.  Skin: Negative for color change, pallor, rash and wound.  Neurological: Positive for numbness. Negative for dizziness, tingling, tremors, seizures, syncope, facial asymmetry, speech difficulty, weakness, light-headedness, headaches and paresthesias.  Hematological: Negative for adenopathy. Does not bruise/bleed easily.  Psychiatric/Behavioral: Negative.        Objective:   Physical Exam  Vitals reviewed. Constitutional: She is oriented to person, place, and time. She appears well-developed and well-nourished. No distress.  HENT:  Head: Normocephalic and atraumatic.  Mouth/Throat: Oropharynx is clear and moist. No oropharyngeal exudate.  Eyes: Conjunctivae are normal. Right eye exhibits no discharge. Left eye exhibits no discharge. No scleral icterus.  Neck: Normal range of motion. Neck supple. No JVD present. No tracheal deviation present. No thyromegaly present.  Cardiovascular: Normal rate, regular rhythm, normal heart sounds and intact distal pulses.  Exam reveals no gallop and no friction rub.   No murmur heard. Pulmonary/Chest: Effort normal and breath sounds normal. No stridor. No respiratory distress. She has no wheezes. She has no rales. She exhibits no tenderness.  Abdominal: Soft. Bowel sounds are normal. She exhibits no distension and no mass. There is no tenderness. There is no rebound and no guarding.  Musculoskeletal: Normal range of motion. She exhibits no edema and no tenderness.       Lumbar back: Normal. She exhibits normal range of motion, no tenderness, no bony tenderness, no swelling, no edema and no deformity.    Lymphadenopathy:    She has no cervical adenopathy.  Neurological: She  is alert and oriented to person, place, and time. She has normal strength. She displays no atrophy, no tremor and normal reflexes. No cranial nerve deficit or sensory deficit. She exhibits normal muscle tone. She displays a negative Romberg sign. She displays no seizure activity. Coordination and gait normal. She displays no Babinski's sign on the right side. She displays no Babinski's sign on the left side.  Reflex Scores:      Tricep reflexes are 1+ on the right side and 1+ on the left side.      Bicep reflexes are 1+ on the right side and 1+ on the left side.      Brachioradialis reflexes are 1+ on the right side and 1+ on the left side.      Patellar reflexes are 1+ on the right side and 1+ on the left side.      Achilles reflexes are 1+ on the right side and 1+ on the left side. Skin: Skin is warm and dry. No rash noted. She is not diaphoretic. No erythema. No pallor.  Psychiatric: She has a normal mood and affect. Her behavior is normal. Judgment and thought content normal.     Lab Results  Component Value Date   WBC 6.9 12/25/2010   HGB 13.3 12/25/2010   HCT 40.0 12/25/2010   PLT 257.0 12/25/2010   GLUCOSE 97 12/25/2010   CHOL 149 12/25/2010   TRIG 62.0 12/25/2010   HDL 57.50 12/25/2010   LDLCALC 79 12/25/2010   ALT 19 12/25/2010   AST 22 12/25/2010   NA 140 12/25/2010   K 4.7 12/25/2010   CL 105 12/25/2010   CREATININE 1.0 12/25/2010   BUN 12 12/25/2010   CO2 30 12/25/2010   TSH 2.18 12/25/2010   INR 1.0 05/25/2007   HGBA1C 6.0 12/25/2010  Dg Lumbar Spine Complete  04/16/2011  *RADIOLOGY REPORT*  Clinical Data: Low back pain with no known injury  LUMBAR SPINE - COMPLETE 4+ VIEW  Comparison: 06/19/2009  Findings: There are five lumbar type vertebral bodies identified. Vertebral body heights and disc spaces appear maintained. Alignment appears intact.  Minimal and stable anterior osteophytosis is seen at the L3-4  interspace.  No other significant degenerative changes are suggested.  No pars defects are identified.  The sacral white lines and sacroiliac joints appear intact.  IMPRESSION: Stable minimal degenerative change.  No new worrisome focal or acute abnormality identified.  Original Report Authenticated By: Bertha Stakes, M.D.      Assessment & Plan:

## 2011-05-05 ENCOUNTER — Ambulatory Visit: Payer: 59 | Admitting: *Deleted

## 2011-05-06 ENCOUNTER — Telehealth: Payer: Self-pay

## 2011-05-06 NOTE — Progress Notes (Signed)
Addended by: Etta Grandchild on: 05/06/2011 08:51 AM   Modules accepted: Orders

## 2011-05-06 NOTE — Telephone Encounter (Signed)
done

## 2011-05-06 NOTE — Telephone Encounter (Signed)
Patient called [facility] stating that MD talked about ordering an MRI for her. She that the per [facility] that no order has been entered. Please advise

## 2011-05-06 NOTE — Telephone Encounter (Signed)
[facility] to notify, closing phone note

## 2011-05-08 ENCOUNTER — Telehealth: Payer: Self-pay | Admitting: Internal Medicine

## 2011-05-08 NOTE — Telephone Encounter (Signed)
The pt called and is requesting benicar hct samples, if available.   Thanks!

## 2011-05-11 ENCOUNTER — Other Ambulatory Visit: Payer: 59

## 2011-05-11 NOTE — Telephone Encounter (Signed)
Samples placed upfront, Called and asked for pt and call was d/c

## 2011-05-12 ENCOUNTER — Ambulatory Visit
Admission: RE | Admit: 2011-05-12 | Discharge: 2011-05-12 | Disposition: A | Discharge status: Home / Self Care | Payer: 59 | Source: Ambulatory Visit | Attending: Internal Medicine | Admitting: Internal Medicine

## 2011-05-12 DIAGNOSIS — M545 Low back pain: Secondary | ICD-10-CM

## 2011-05-13 ENCOUNTER — Telehealth: Payer: Self-pay | Admitting: Internal Medicine

## 2011-05-13 MED ORDER — DIAZEPAM 5 MG PO TABS: 5.0000 mg | ORAL_TABLET | Freq: Two times a day (BID) | ORAL | Status: AC | PRN

## 2011-05-13 NOTE — Telephone Encounter (Signed)
Patient notified and rx called in.

## 2011-05-13 NOTE — Telephone Encounter (Signed)
See printed rx

## 2011-05-13 NOTE — Telephone Encounter (Signed)
Per pt need meds to take MRI---Pt work #--[phone]--PHARM--[facility]/[address]

## 2011-05-16 ENCOUNTER — Ambulatory Visit
Admission: RE | Admit: 2011-05-16 | Discharge: 2011-05-16 | Disposition: A | Discharge status: Home / Self Care | Payer: 59 | Source: Ambulatory Visit | Attending: Internal Medicine | Admitting: Internal Medicine

## 2011-06-05 ENCOUNTER — Ambulatory Visit: Payer: 59 | Admitting: Internal Medicine

## 2011-06-12 ENCOUNTER — Ambulatory Visit: Payer: 59 | Admitting: Internal Medicine

## 2011-07-27 ENCOUNTER — Telehealth: Payer: Self-pay | Admitting: Internal Medicine

## 2011-07-27 MED ORDER — LOSARTAN POTASSIUM-HCTZ 100-25 MG PO TABS: 1.0000 | ORAL_TABLET | Freq: Every day | ORAL | Status: DC

## 2011-07-27 NOTE — Telephone Encounter (Signed)
done

## 2011-07-27 NOTE — Telephone Encounter (Signed)
Pt requesting samples or generic of blood pressure med--too expensive--benicar--[facility] [address]---pt ph# [phone]--cell/[phone]

## 2011-07-29 NOTE — Telephone Encounter (Signed)
Returned call/[facility] to check pharmacy

## 2011-08-01 ENCOUNTER — Encounter (HOSPITAL_BASED_OUTPATIENT_CLINIC_OR_DEPARTMENT_OTHER): Payer: Self-pay | Admitting: *Deleted

## 2011-08-01 ENCOUNTER — Emergency Department (HOSPITAL_BASED_OUTPATIENT_CLINIC_OR_DEPARTMENT_OTHER)
Admission: EM | Admit: 2011-08-01 | Discharge: 2011-08-01 | Disposition: A | Discharge status: Home / Self Care | Payer: 59 | Attending: Emergency Medicine | Admitting: Emergency Medicine

## 2011-08-01 DIAGNOSIS — M1990 Unspecified osteoarthritis, unspecified site: Secondary | ICD-10-CM | POA: Insufficient documentation

## 2011-08-01 DIAGNOSIS — Z8673 Personal history of transient ischemic attack (TIA), and cerebral infarction without residual deficits: Secondary | ICD-10-CM | POA: Insufficient documentation

## 2011-08-01 DIAGNOSIS — H53149 Visual discomfort, unspecified: Secondary | ICD-10-CM | POA: Insufficient documentation

## 2011-08-01 DIAGNOSIS — Z79899 Other long term (current) drug therapy: Secondary | ICD-10-CM | POA: Insufficient documentation

## 2011-08-01 DIAGNOSIS — R51 Headache: Secondary | ICD-10-CM | POA: Insufficient documentation

## 2011-08-01 DIAGNOSIS — I10 Essential (primary) hypertension: Secondary | ICD-10-CM | POA: Insufficient documentation

## 2011-08-01 MED ORDER — OXYCODONE-ACETAMINOPHEN 5-325 MG PO TABS
2.0000 | ORAL_TABLET | Freq: Once | ORAL | Status: AC
  Administered 2011-08-01: 2 via ORAL
  Filled 2011-08-01: qty 2

## 2011-08-01 MED ORDER — BUTALBITAL-APAP-CAFFEINE 50-325-40 MG PO TABS: 1.0000 | ORAL_TABLET | Freq: Four times a day (QID) | ORAL | Status: DC | PRN

## 2011-08-01 MED ORDER — BUTALBITAL-APAP-CAFFEINE 50-325-40 MG PO TABS
2.0000 | ORAL_TABLET | Freq: Once | ORAL | Status: DC
  Filled 2011-08-01: qty 2

## 2011-08-01 NOTE — ED Notes (Signed)
Pt c/o of headache for 2 days with eye pain. Hx of headaches

## 2011-08-01 NOTE — ED Provider Notes (Signed)
History     CSN: [id number]  Arrival date & time 08/01/11  0045   First MD Initiated Contact with Patient 08/01/11 [phone]      Chief Complaint  Patient presents with  . Headache    (Consider location/radiation/quality/duration/timing/severity/associated sxs/prior treatment) Patient is a 46 y.o. female presenting with headaches. The history is provided by the patient. No language interpreter was used.  Headache  This is a new problem. The current episode started 2 days ago. The problem occurs constantly. The problem has been gradually worsening. The headache is associated with bright light ("blood pressure"). The pain is located in the frontal region. The quality of the pain is described as dull and throbbing. The pain is moderate (consistent with prior). The pain does not radiate. Pertinent negatives include no anorexia, no fever, no malaise/fatigue, no chest pressure, no near-syncope, no orthopnea, no palpitations, no syncope, no shortness of breath, no nausea and no vomiting. She has tried NSAIDs for the symptoms. The treatment provided mild relief.    Past Medical History  Diagnosis Date  . History of colonic polyps   . Headache   . Hypertension   . Osteoarthritis   . TIA (transient ischemic attack) 2007    hx of  . Obesity   . Vasculitis     R leg - Polyarthritis    Past Surgical History  Procedure Date  . Abdominal hysterectomy     Family History  Problem Relation Age of Onset  . Arthritis Other   . Hypertension Other   . Stroke Other   . Stroke Mother   . Hypertension Mother   . Hypertension Father   . Cancer Father     Prostate    History  Substance Use Topics  . Smoking status: Never Smoker   . Smokeless tobacco: Not on file  . Alcohol Use: No    OB History    Grav Para Term Preterm Abortions TAB SAB Ect Mult Living                  Review of Systems  Constitutional: Negative for fever, malaise/fatigue, activity change, appetite change and  fatigue.  HENT: Negative for congestion, sore throat, rhinorrhea, neck pain and neck stiffness.   Eyes: Positive for photophobia. Negative for pain and visual disturbance.  Respiratory: Negative for cough and shortness of breath.   Cardiovascular: Negative for chest pain, palpitations, orthopnea, syncope and near-syncope.  Gastrointestinal: Negative for nausea, vomiting, abdominal pain and anorexia.  Genitourinary: Negative for dysuria, urgency, frequency and flank pain.  Musculoskeletal: Negative for myalgias, back pain and arthralgias.  Neurological: Positive for headaches. Negative for dizziness, weakness, light-headedness and numbness.  All other systems reviewed and are negative.    Allergies  Review of patient's allergies indicates no known allergies.  Home Medications   Current Outpatient Rx  Name Route Sig Dispense Refill  . LOSARTAN POTASSIUM-HCTZ 100-25 MG PO TABS Oral Take 1 tablet by mouth daily. 90 tablet 3  . NAPROXEN 500 MG PO TABS Oral Take 1 tablet (500 mg total) by mouth 2 (two) times daily with a meal. 60 tablet 1  . BUTALBITAL-APAP-CAFFEINE 50-325-40 MG PO TABS Oral Take 1-2 tablets by mouth every 6 (six) hours as needed for headache. 20 tablet 0    BP 157/87  Pulse 74  Temp(Src) 98.4 F (36.9 C) (Oral)  Resp 20  SpO2 100%  Physical Exam  Nursing note and vitals reviewed. Constitutional: She is oriented to person, place, and time. She  appears well-developed and well-nourished. No distress.  HENT:  Head: Normocephalic and atraumatic.  Mouth/Throat: Oropharynx is clear and moist. No oropharyngeal exudate.  Eyes: Conjunctivae and EOM are normal. Pupils are equal, round, and reactive to light.  Neck: Normal range of motion. Neck supple. No thyromegaly present.  Cardiovascular: Normal rate, regular rhythm, normal heart sounds and intact distal pulses.  Exam reveals no gallop and no friction rub.   No murmur heard. Pulmonary/Chest: Effort normal and breath  sounds normal. No respiratory distress. She exhibits no tenderness.  Abdominal: Soft. Bowel sounds are normal. There is no tenderness.  Musculoskeletal: Normal range of motion. She exhibits no tenderness.  Lymphadenopathy:    She has no cervical adenopathy.  Neurological: She is alert and oriented to person, place, and time. She has normal strength and normal reflexes. No cranial nerve deficit or sensory deficit. Coordination normal.  Skin: Skin is warm and dry.    ED Course  Procedures (including critical care time)  Labs Reviewed - No data to display No results found.   1. Cephalgia       MDM  Generalized headache. Likely migrainous given the photophobia. She has a history of similar headaches in the past. Headache was gradual in onset it is not the worse headache of her life. I have no concern about a malignant cause of headache such as subarachnoid hemorrhage or meningitis. There is no indication for imaging at this time. She received a dose of oral pain medication. She refused IV and IM medications. She'll be discharged home with a prescription for Fioricet and instructed to followup with her primary care physician        Dayton Bailiff, MD 08/01/11 0300

## 2011-08-01 NOTE — ED Notes (Signed)
C/o headache for about two days.

## 2011-08-01 NOTE — Discharge Instructions (Signed)

## 2011-08-07 ENCOUNTER — Ambulatory Visit (INDEPENDENT_AMBULATORY_CARE_PROVIDER_SITE_OTHER): Payer: 59 | Admitting: Internal Medicine

## 2011-08-07 ENCOUNTER — Encounter: Payer: Self-pay | Admitting: Internal Medicine

## 2011-08-07 DIAGNOSIS — I10 Essential (primary) hypertension: Secondary | ICD-10-CM

## 2011-08-07 DIAGNOSIS — M300 Polyarteritis nodosa: Secondary | ICD-10-CM

## 2011-08-07 DIAGNOSIS — G43909 Migraine, unspecified, not intractable, without status migrainosus: Secondary | ICD-10-CM | POA: Insufficient documentation

## 2011-08-07 DIAGNOSIS — M545 Low back pain: Secondary | ICD-10-CM

## 2011-08-07 MED ORDER — ZOLMITRIPTAN 2.5 MG PO TBDP: 2.5000 mg | ORAL_TABLET | ORAL | Status: DC | PRN

## 2011-08-07 NOTE — Patient Instructions (Signed)
Back Pain, Adult Low back pain is very common. About 1 in 5 people have back pain.The cause of low back pain is rarely dangerous. The pain often gets better over time.About half of people with a sudden onset of back pain feel better in just 2 weeks. About 8 in 10 people feel better by 6 weeks.  CAUSES Some common causes of back pain include:  Strain of the muscles or ligaments supporting the spine.   Wear and tear (degeneration) of the spinal discs.   Arthritis.   Direct injury to the back.  DIAGNOSIS Most of the time, the direct cause of low back pain is not known.However, back pain can be treated effectively even when the exact cause of the pain is unknown.Answering your caregiver's questions about your overall health and symptoms is one of the most accurate ways to make sure the cause of your pain is not dangerous. If your caregiver needs more information, he or she may order lab work or imaging tests (X-rays or MRIs).However, even if imaging tests show changes in your back, this usually does not require surgery. HOME CARE INSTRUCTIONS For many people, back pain returns.Since low back pain is rarely dangerous, it is often a condition that people can learn to manageon their own.   Remain active. It is stressful on the back to sit or stand in one place. Do not sit, drive, or stand in one place for more than 30 minutes at a time. Take short walks on level surfaces as soon as pain allows.Try to increase the length of time you walk each day.   Do not stay in bed.Resting more than 1 or 2 days can delay your recovery.   Do not avoid exercise or work.Your body is made to move.It is not dangerous to be active, even though your back may hurt.Your back will likely heal faster if you return to being active before your pain is gone.   Pay attention to your body when you bend and lift. Many people have less discomfortwhen lifting if they bend their knees, keep the load close to their  bodies,and avoid twisting. Often, the most comfortable positions are those that put less stress on your recovering back.   Find a comfortable position to sleep. Use a firm mattress and lie on your side with your knees slightly bent. If you lie on your back, put a pillow under your knees.   Only take over-the-counter or prescription medicines as directed by your caregiver. Over-the-counter medicines to reduce pain and inflammation are often the most helpful.Your caregiver may prescribe muscle relaxant drugs.These medicines help dull your pain so you can more quickly return to your normal activities and healthy exercise.   Put ice on the injured area.   Put ice in a plastic bag.   Place a towel between your skin and the bag.   Leave the ice on for 15 to 20 minutes, 3 to 4 times a day for the first 2 to 3 days. After that, ice and heat may be alternated to reduce pain and spasms.   Ask your caregiver about trying back exercises and gentle massage. This may be of some benefit.   Avoid feeling anxious or stressed.Stress increases muscle tension and can worsen back pain.It is important to recognize when you are anxious or stressed and learn ways to manage it.Exercise is a great option.  SEEK MEDICAL CARE IF:  You have pain that is not relieved with rest or medicine.   You have   pain that does not improve in 1 week.   You have new symptoms.   You are generally not feeling well.  SEEK IMMEDIATE MEDICAL CARE IF:   You have pain that radiates from your back into your legs.   You develop new bowel or bladder control problems.   You have unusual weakness or numbness in your arms or legs.   You develop nausea or vomiting.   You develop abdominal pain.   You feel faint.  Document Released: 03/30/2005 Document Revised: 03/19/2011 Document Reviewed: 08/18/2010 [facility] Patient Information 2012 [place], [place].Migraine Headache A migraine headache is an intense, throbbing pain on one or  both sides of your head. The exact cause of a migraine headache is not always known. A migraine may be caused when nerves in the brain become irritated and release chemicals that cause swelling within blood vessels, causing pain. Many migraine sufferers have a family history of migraines. Before you get a migraine you may or may not get an aura. An aura is a group of symptoms that can predict the beginning of a migraine. An aura may include:  Visual changes such as:   Flashing lights.   Bright spots or zig-zag lines.   Tunnel vision.   Feelings of numbness.   Trouble talking.   Muscle weakness.  SYMPTOMS  Pain on one or both sides of your head.   Pain that is pulsating or throbbing in nature.   Pain that is severe enough to prevent daily activities.   Pain that is aggravated by any daily physical activity.   Nausea (feeling sick to your stomach), vomiting, or both.   Pain with exposure to bright lights, loud noises, or activity.   General sensitivity to bright lights or loud noises.  MIGRAINE TRIGGERS Examples of triggers of migraine headaches include:   Alcohol.   Smoking.   Stress.   It may be related to menses (female menstruation).   Aged cheeses.   Foods or drinks that contain nitrates, glutamate, aspartame, or tyramine.   Lack of sleep.   Chocolate.   Caffeine.   Hunger.   Medications such as nitroglycerine (used to treat chest pain), birth control pills, estrogen, and some blood pressure medications.  DIAGNOSIS  A migraine headache is often diagnosed based on:  Symptoms.   Physical examination.   A computerized X-ray scan (computed tomography, CT) of your head.  TREATMENT  Medications can help prevent migraines if they are recurrent or should they become recurrent. Your caregiver can help you with a medication or treatment program that will be helpful to you.   Lying down in a dark, quiet room may be helpful.   Keeping a headache diary may  help you find a trend as to what may be triggering your headaches.  SEEK IMMEDIATE MEDICAL CARE IF:   You have confusion, personality changes or seizures.   You have headaches that wake you from sleep.   You have an increased frequency in your headaches.   You have a stiff neck.   You have a loss of vision.   You have muscle weakness.   You start losing your balance or have trouble walking.   You feel faint or pass out.  MAKE SURE YOU:   Understand these instructions.   Will watch your condition.   Will get help right away if you are not doing well or get worse.  Document Released: 03/30/2005 Document Revised: 03/19/2011 Document Reviewed: 11/13/2008 [facility] Patient Information 2012 [place], [place].

## 2011-08-07 NOTE — Progress Notes (Signed)
Subjective:    Patient ID: [name], female    DOB: [birth year], 46 y.o.   MRN: [id number]  Headache  This is a new problem. The current episode started in the past 7 days. The problem has been rapidly improving. The pain is located in the bilateral region. The pain does not radiate. The pain quality is not similar to prior headaches. The quality of the pain is described as throbbing. The pain is at a severity of 3/10. The pain is mild. Associated symptoms include nausea, phonophobia and photophobia. Pertinent negatives include no abdominal pain, abnormal behavior, anorexia, back pain, blurred vision, coughing, dizziness, drainage, ear pain, eye pain, eye redness, eye watering, facial sweating, fever, hearing loss, insomnia, loss of balance, muscle aches, neck pain, numbness, rhinorrhea, scalp tenderness, seizures, sinus pressure, sore throat, swollen glands, tingling, tinnitus, visual change, vomiting, weakness or weight loss. She has tried oral narcotics for the symptoms. The treatment provided significant relief.  Back Pain This is a recurrent problem. The current episode started more than 1 year ago. The problem occurs intermittently. The problem has been gradually worsening since onset. The pain is present in the lumbar spine. The quality of the pain is described as burning. The pain radiates to the left thigh. The pain is at a severity of 6/10. The pain is moderate. The pain is worse during the day. The symptoms are aggravated by bending. Associated symptoms include headaches. Pertinent negatives include no abdominal pain, bladder incontinence, bowel incontinence, chest pain, dysuria, fever, leg pain, numbness, paresis, paresthesias, pelvic pain, perianal numbness, tingling, weakness or weight loss. Risk factors include lack of exercise and obesity. She has tried NSAIDs for the symptoms. The treatment provided mild relief.      Review of Systems  Constitutional: Negative for fever,  chills, weight loss, diaphoresis, activity change, appetite change, fatigue and unexpected weight change.  HENT: Negative for hearing loss, ear pain, sore throat, rhinorrhea, neck pain, sinus pressure and tinnitus.   Eyes: Positive for photophobia. Negative for blurred vision, pain, discharge, redness, itching and visual disturbance.  Respiratory: Negative for apnea, cough, choking, chest tightness, shortness of breath, wheezing and stridor.   Cardiovascular: Negative for chest pain, palpitations and leg swelling.  Gastrointestinal: Positive for nausea. Negative for vomiting, abdominal pain, diarrhea, constipation, blood in stool, abdominal distention, anal bleeding, rectal pain, anorexia and bowel incontinence.  Genitourinary: Negative.  Negative for bladder incontinence, dysuria and pelvic pain.  Musculoskeletal: Negative for myalgias, back pain, joint swelling, arthralgias and gait problem.  Skin: Negative for color change, pallor, rash and wound.  Neurological: Positive for headaches. Negative for dizziness, tingling, tremors, seizures, syncope, facial asymmetry, speech difficulty, weakness, light-headedness, numbness, paresthesias and loss of balance.  Hematological: Negative for adenopathy. Does not bruise/bleed easily.  Psychiatric/Behavioral: Negative.  The patient does not have insomnia.        Objective:   Physical Exam  Vitals reviewed. Constitutional: She is oriented to person, place, and time. She appears well-developed and well-nourished. No distress.  HENT:  Head: Normocephalic and atraumatic.  Mouth/Throat: Oropharynx is clear and moist. No oropharyngeal exudate.  Eyes: Conjunctivae are normal. Right eye exhibits no discharge. Left eye exhibits no discharge. No scleral icterus.  Neck: Normal range of motion. Neck supple. No JVD present. No tracheal deviation present. No thyromegaly present.  Cardiovascular: Normal rate, regular rhythm, normal heart sounds and intact distal  pulses.  Exam reveals no gallop and no friction rub.   No murmur heard. Pulmonary/Chest: Effort normal and  breath sounds normal. No stridor. No respiratory distress. She has no wheezes. She has no rales. She exhibits no tenderness.  Abdominal: Soft. Bowel sounds are normal. She exhibits no distension and no mass. There is no tenderness. There is no rebound and no guarding.  Musculoskeletal: Normal range of motion. She exhibits no edema and no tenderness.       Lumbar back: Normal. She exhibits normal range of motion, no tenderness, no bony tenderness, no swelling, no edema, no deformity, no laceration, no pain, no spasm and normal pulse.  Lymphadenopathy:    She has no cervical adenopathy.  Neurological: She is alert and oriented to person, place, and time. She has normal strength. She displays no atrophy and no tremor. No cranial nerve deficit or sensory deficit. She exhibits normal muscle tone. She displays a negative Romberg sign. She displays no seizure activity. Coordination and gait normal. She displays no Babinski's sign on the right side. She displays no Babinski's sign on the left side.  Reflex Scores:      Tricep reflexes are 1+ on the right side and 1+ on the left side.      Bicep reflexes are 1+ on the right side and 1+ on the left side.      Brachioradialis reflexes are 1+ on the right side and 1+ on the left side.      Patellar reflexes are 1+ on the right side and 1+ on the left side.      Achilles reflexes are 1+ on the right side and 1+ on the left side.      - SLR in BLE  Skin: Skin is warm and dry. No rash noted. She is not diaphoretic. No erythema. No pallor.  Psychiatric: She has a normal mood and affect. Her behavior is normal. Judgment and thought content normal.      Lab Results  Component Value Date   WBC 6.9 12/25/2010   HGB 13.3 12/25/2010   HCT 40.0 12/25/2010   PLT 257.0 12/25/2010   GLUCOSE 97 12/25/2010   CHOL 149 12/25/2010   TRIG 62.0 12/25/2010   HDL 57.50  12/25/2010   LDLCALC 79 12/25/2010   ALT 19 12/25/2010   AST 22 12/25/2010   NA 140 12/25/2010   K 4.7 12/25/2010   CL 105 12/25/2010   CREATININE 1.0 12/25/2010   BUN 12 12/25/2010   CO2 30 12/25/2010   TSH 2.18 12/25/2010   INR 1.0 05/25/2007   HGBA1C 6.0 12/25/2010      Assessment & Plan:

## 2011-08-09 ENCOUNTER — Encounter: Payer: Self-pay | Admitting: Internal Medicine

## 2011-08-09 NOTE — Assessment & Plan Note (Signed)
I think she may benefit from additional treatments such as ESI so I have referred her to pain management

## 2011-08-09 NOTE — Assessment & Plan Note (Signed)
Her BP is well controlled 

## 2011-08-09 NOTE — Assessment & Plan Note (Signed)
She has not had any s/s of recurrence

## 2011-08-09 NOTE — Assessment & Plan Note (Signed)
The HA has resolved and she is free of s/s today, I gave her samples of zomig to try in the even she has another HA

## 2011-08-13 ENCOUNTER — Encounter: Payer: Self-pay | Admitting: Physical Medicine and Rehabilitation

## 2011-09-02 ENCOUNTER — Encounter: Payer: 59 | Attending: Physical Medicine and Rehabilitation | Admitting: Physical Medicine and Rehabilitation

## 2011-09-23 ENCOUNTER — Other Ambulatory Visit: Payer: Self-pay | Admitting: Obstetrics and Gynecology

## 2011-09-23 DIAGNOSIS — Z1231 Encounter for screening mammogram for malignant neoplasm of breast: Secondary | ICD-10-CM

## 2011-09-24 ENCOUNTER — Encounter: Payer: Self-pay | Admitting: Internal Medicine

## 2011-09-24 ENCOUNTER — Other Ambulatory Visit (INDEPENDENT_AMBULATORY_CARE_PROVIDER_SITE_OTHER): Payer: 59

## 2011-09-24 ENCOUNTER — Ambulatory Visit (INDEPENDENT_AMBULATORY_CARE_PROVIDER_SITE_OTHER): Payer: 59 | Admitting: Internal Medicine

## 2011-09-24 VITALS — BP 122/84 | HR 76 | Temp 98.7°F | Resp 20 | Ht 61.0 in | Wt 281.0 lb

## 2011-09-24 DIAGNOSIS — Z0000 Encounter for general adult medical examination without abnormal findings: Secondary | ICD-10-CM

## 2011-09-24 DIAGNOSIS — I10 Essential (primary) hypertension: Secondary | ICD-10-CM

## 2011-09-24 DIAGNOSIS — E876 Hypokalemia: Secondary | ICD-10-CM

## 2011-09-24 LAB — COMPREHENSIVE METABOLIC PANEL
ALT: 17 U/L (ref 0–35)
Alkaline Phosphatase: 145 U/L — ABNORMAL HIGH (ref 39–117)
CO2: 27 mEq/L (ref 19–32)
Creatinine, Ser: 1.1 mg/dL (ref 0.4–1.2)
GFR: 73.01 mL/min (ref >60.00)
Sodium: 138 mEq/L (ref 135–145)
Total Bilirubin: 0.5 mg/dL (ref 0.3–1.2)
Total Protein: 7.5 g/dL (ref 6.0–8.3)

## 2011-09-24 LAB — CBC WITH DIFFERENTIAL/PLATELET
Basophils Absolute: 0 10*3/uL (ref 0.0–0.1)
Basophils Relative: 0.4 % (ref 0.0–3.0)
Eosinophils Absolute: 0.2 10*3/uL (ref 0.0–0.7)
HCT: 37.8 % (ref 36.0–46.0)
Hemoglobin: 12.4 g/dL (ref 12.0–15.0)
Lymphocytes Relative: 31.6 % (ref 12.0–46.0)
MCV: 83.7 fl (ref 78.0–100.0)
Neutrophils Relative %: 57.8 % (ref 43.0–77.0)
WBC: 7.4 10*3/uL (ref 4.5–10.5)

## 2011-09-24 LAB — URINALYSIS, ROUTINE W REFLEX MICROSCOPIC
Ketones, ur: NEGATIVE
Specific Gravity, Urine: <=1.005 (ref 1.000–1.030)
Total Protein, Urine: NEGATIVE
Urine Glucose: NEGATIVE
pH: 6 (ref 5.0–8.0)

## 2011-09-24 LAB — LIPID PANEL
Cholesterol: 140 mg/dL (ref 0–200)
HDL: 49.2 mg/dL (ref >39.00)
Triglycerides: 80 mg/dL (ref 0.0–149.0)

## 2011-09-24 MED ORDER — POTASSIUM CHLORIDE CRYS ER 15 MEQ PO TBCR: 15.0000 meq | EXTENDED_RELEASE_TABLET | Freq: Two times a day (BID) | ORAL | Status: DC

## 2011-09-24 NOTE — Assessment & Plan Note (Signed)
Her BP is well controlled 

## 2011-09-24 NOTE — Patient Instructions (Signed)
Hypertension As your heart beats, it forces blood through your arteries. This force is your blood pressure. If the pressure is too high, it is called hypertension (HTN) or high blood pressure. HTN is dangerous because you may have it and not know it. High blood pressure may mean that your heart has to work harder to pump blood. Your arteries may be narrow or stiff. The extra work puts you at risk for heart disease, stroke, and other problems.  Blood pressure consists of two numbers, a higher number over a lower, 110/72, for example. It is stated as "110 over 72." The ideal is below 120 for the top number (systolic) and under 80 for the bottom (diastolic). Write down your blood pressure today. You should pay close attention to your blood pressure if you have certain conditions such as:  Heart failure.   Prior heart attack.   Diabetes   Chronic kidney disease.   Prior stroke.   Multiple risk factors for heart disease.  To see if you have HTN, your blood pressure should be measured while you are seated with your arm held at the level of the heart. It should be measured at least twice. A one-time elevated blood pressure reading (especially in the Emergency Department) does not mean that you need treatment. There may be conditions in which the blood pressure is different between your right and left arms. It is important to see your caregiver soon for a recheck. Most people have essential hypertension which means that there is not a specific cause. This type of high blood pressure may be lowered by changing lifestyle factors such as:  Stress.   Smoking.   Lack of exercise.   Excessive weight.   Drug/tobacco/alcohol use.   Eating less salt.  Most people do not have symptoms from high blood pressure until it has caused damage to the body. Effective treatment can often prevent, delay or reduce that damage. TREATMENT  When a cause has been identified, treatment for high blood pressure is  directed at the cause. There are a large number of medications to treat HTN. These fall into several categories, and your caregiver will help you select the medicines that are best for you. Medications may have side effects. You should review side effects with your caregiver. If your blood pressure stays high after you have made lifestyle changes or started on medicines,   Your medication(s) may need to be changed.   Other problems may need to be addressed.   Be certain you understand your prescriptions, and know how and when to take your medicine.   Be sure to follow up with your caregiver within the time frame advised (usually within two weeks) to have your blood pressure rechecked and to review your medications.   If you are taking more than one medicine to lower your blood pressure, make sure you know how and at what times they should be taken. Taking two medicines at the same time can result in blood pressure that is too low.  SEEK IMMEDIATE MEDICAL CARE IF:  You develop a severe headache, blurred or changing vision, or confusion.   You have unusual weakness or numbness, or a faint feeling.   You have severe chest or abdominal pain, vomiting, or breathing problems.  MAKE SURE YOU:   Understand these instructions.   Will watch your condition.   Will get help right away if you are not doing well or get worse.  Document Released: 03/30/2005 Document Revised: 03/19/2011 Document Reviewed:   11/18/2007 [employer] Patient Information 2012 [employer].Preventive Care for Adults, Female A healthy lifestyle and preventive care can promote health and wellness. Preventive health guidelines for women include the following key practices.  A routine yearly physical is a good way to check with your caregiver about your health and preventive screening. It is a chance to share any concerns and updates on your health, and to receive a thorough exam.   Visit your dentist for a routine exam and  preventive care every 6 months. Brush your teeth twice a day and floss once a day. Good oral hygiene prevents tooth decay and gum disease.   The frequency of eye exams is based on your age, health, family medical history, use of contact lenses, and other factors. Follow your caregiver's recommendations for frequency of eye exams.   Eat a healthy diet. Foods like vegetables, fruits, whole grains, low-fat dairy products, and lean protein foods contain the nutrients you need without too many calories. Decrease your intake of foods high in solid fats, added sugars, and salt. Eat the right amount of calories for you.Get information about a proper diet from your caregiver, if necessary.   Regular physical exercise is one of the most important things you can do for your health. Most adults should get at least 150 minutes of moderate-intensity exercise (any activity that increases your heart rate and causes you to sweat) each week. In addition, most adults need muscle-strengthening exercises on 2 or more days a week.   Maintain a healthy weight. The body mass index (BMI) is a screening tool to identify possible weight problems. It provides an estimate of body fat based on height and weight. Your caregiver can help determine your BMI, and can help you achieve or maintain a healthy weight.For adults 20 years and older:   A BMI below 18.5 is considered underweight.   A BMI of 18.5 to 24.9 is normal.   A BMI of 25 to 29.9 is considered overweight.   A BMI of 30 and above is considered obese.   Maintain normal blood lipids and cholesterol levels by exercising and minimizing your intake of saturated fat. Eat a balanced diet with plenty of fruit and vegetables. Blood tests for lipids and cholesterol should begin at age 20 and be repeated every 5 years. If your lipid or cholesterol levels are high, you are over 50, or you are at high risk for heart disease, you may need your cholesterol levels checked more  frequently.Ongoing high lipid and cholesterol levels should be treated with medicines if diet and exercise are not effective.   If you smoke, find out from your caregiver how to quit. If you do not use tobacco, do not start.   If you are pregnant, do not drink alcohol. If you are breastfeeding, be very cautious about drinking alcohol. If you are not pregnant and choose to drink alcohol, do not exceed 1 drink per day. One drink is considered to be 12 ounces (355 mL) of beer, 5 ounces (148 mL) of wine, or 1.5 ounces (44 mL) of liquor.   Avoid use of street drugs. Do not share needles with anyone. Ask for help if you need support or instructions about stopping the use of drugs.   High blood pressure causes heart disease and increases the risk of stroke. Your blood pressure should be checked at least every 1 to 2 years. Ongoing high blood pressure should be treated with medicines if weight loss and exercise are not effective.     If you are 55 to 45 years old, ask your caregiver if you should take aspirin to prevent strokes.   Diabetes screening involves taking a blood sample to check your fasting blood sugar level. This should be done once every 3 years, after age 45, if you are within normal weight and without risk factors for diabetes. Testing should be considered at a younger age or be carried out more frequently if you are overweight and have at least 1 risk factor for diabetes.   Breast cancer screening is essential preventive care for women. You should practice "breast self-awareness." This means understanding the normal appearance and feel of your breasts and may include breast self-examination. Any changes detected, no matter how small, should be reported to a caregiver. Women in their 20s and 30s should have a clinical breast exam (CBE) by a caregiver as part of a regular health exam every 1 to 3 years. After age 40, women should have a CBE every year. Starting at age 40, women should consider  having a mammography (breast X-ray test) every year. Women who have a family history of breast cancer should talk to their caregiver about genetic screening. Women at a high risk of breast cancer should talk to their caregivers about having magnetic resonance imaging (MRI) and a mammography every year.   The Pap test is a screening test for cervical cancer. A Pap test can show cell changes on the cervix that might become cervical cancer if left untreated. A Pap test is a procedure in which cells are obtained and examined from the lower end of the uterus (cervix).   Women should have a Pap test starting at age 21.   Between ages 21 and 29, Pap tests should be repeated every 2 years.   Beginning at age 30, you should have a Pap test every 3 years as long as the past 3 Pap tests have been normal.   Some women have medical problems that increase the chance of getting cervical cancer. Talk to your caregiver about these problems. It is especially important to talk to your caregiver if a new problem develops soon after your last Pap test. In these cases, your caregiver may recommend more frequent screening and Pap tests.   The above recommendations are the same for women who have or have not gotten the vaccine for human papillomavirus (HPV).   If you had a hysterectomy for a problem that was not cancer or a condition that could lead to cancer, then you no longer need Pap tests. Even if you no longer need a Pap test, a regular exam is a good idea to make sure no other problems are starting.   If you are between ages 65 and 70, and you have had normal Pap tests going back 10 years, you no longer need Pap tests. Even if you no longer need a Pap test, a regular exam is a good idea to make sure no other problems are starting.   If you have had past treatment for cervical cancer or a condition that could lead to cancer, you need Pap tests and screening for cancer for at least 20 years after your treatment.    If Pap tests have been discontinued, risk factors (such as a new sexual partner) need to be reassessed to determine if screening should be resumed.   The HPV test is an additional test that may be used for cervical cancer screening. The HPV test looks for the virus that can cause   the cell changes on the cervix. The cells collected during the Pap test can be tested for HPV. The HPV test could be used to screen women aged 30 years and older, and should be used in women of any age who have unclear Pap test results. After the age of 30, women should have HPV testing at the same frequency as a Pap test.   Colorectal cancer can be detected and often prevented. Most routine colorectal cancer screening begins at the age of 50 and continues through age 75. However, your caregiver may recommend screening at an earlier age if you have risk factors for colon cancer. On a yearly basis, your caregiver may provide home test kits to check for hidden blood in the stool. Use of a small camera at the end of a tube, to directly examine the colon (sigmoidoscopy or colonoscopy), can detect the earliest forms of colorectal cancer. Talk to your caregiver about this at age 50, when routine screening begins. Direct examination of the colon should be repeated every 5 to 10 years through age 75, unless early forms of pre-cancerous polyps or small growths are found.   Hepatitis C blood testing is recommended for all people born from 1945 through 1965 and any individual with known risks for hepatitis C.   Practice safe sex. Use condoms and avoid high-risk sexual practices to reduce the spread of sexually transmitted infections (STIs). STIs include gonorrhea, chlamydia, syphilis, trichomonas, herpes, HPV, and human immunodeficiency virus (HIV). Herpes, HIV, and HPV are viral illnesses that have no cure. They can result in disability, cancer, and death. Sexually active women aged 25 and younger should be checked for chlamydia. Older  women with new or multiple partners should also be tested for chlamydia. Testing for other STIs is recommended if you are sexually active and at increased risk.   Osteoporosis is a disease in which the bones lose minerals and strength with aging. This can result in serious bone fractures. The risk of osteoporosis can be identified using a bone density scan. Women ages 65 and over and women at risk for fractures or osteoporosis should discuss screening with their caregivers. Ask your caregiver whether you should take a calcium supplement or vitamin D to reduce the rate of osteoporosis.   Menopause can be associated with physical symptoms and risks. Hormone replacement therapy is available to decrease symptoms and risks. You should talk to your caregiver about whether hormone replacement therapy is right for you.   Use sunscreen with sun protection factor (SPF) of 30 or more. Apply sunscreen liberally and repeatedly throughout the day. You should seek shade when your shadow is shorter than you. Protect yourself by wearing long sleeves, pants, a wide-brimmed hat, and sunglasses year round, whenever you are outdoors.   Once a month, do a whole body skin exam, using a mirror to look at the skin on your back. Notify your caregiver of new moles, moles that have irregular borders, moles that are larger than a pencil eraser, or moles that have changed in shape or color.   Stay current with required immunizations.   Influenza. You need a dose every fall (or winter). The composition of the flu vaccine changes each year, so being vaccinated once is not enough.   Pneumococcal polysaccharide. You need 1 to 2 doses if you smoke cigarettes or if you have certain chronic medical conditions. You need 1 dose at age 65 (or older) if you have never been vaccinated.   Tetanus, diphtheria, pertussis (Tdap,   Td). Get 1 dose of Tdap vaccine if you are younger than age 65, are over 65 and have contact with an infant, are a  healthcare worker, are pregnant, or simply want to be protected from whooping cough. After that, you need a Td booster dose every 10 years. Consult your caregiver if you have not had at least 3 tetanus and diphtheria-containing shots sometime in your life or have a deep or dirty wound.   HPV. You need this vaccine if you are a woman age 26 or younger. The vaccine is given in 3 doses over 6 months.   Measles, mumps, rubella (MMR). You need at least 1 dose of MMR if you were born in 1957 or later. You may also need a second dose.   Meningococcal. If you are age 19 to 21 and a first-year college student living in a residence hall, or have one of several medical conditions, you need to get vaccinated against meningococcal disease. You may also need additional booster doses.   Zoster (shingles). If you are age 60 or older, you should get this vaccine.   Varicella (chickenpox). If you have never had chickenpox or you were vaccinated but received only 1 dose, talk to your caregiver to find out if you need this vaccine.   Hepatitis A. You need this vaccine if you have a specific risk factor for hepatitis A virus infection or you simply wish to be protected from this disease. The vaccine is usually given as 2 doses, 6 to 18 months apart.   Hepatitis B. You need this vaccine if you have a specific risk factor for hepatitis B virus infection or you simply wish to be protected from this disease. The vaccine is given in 3 doses, usually over 6 months.  Preventive Services / Frequency Ages 19 to 39  Blood pressure check.** / Every 1 to 2 years.   Lipid and cholesterol check.** / Every 5 years beginning at age 20.   Clinical breast exam.** / Every 3 years for women in their 20s and 30s.   Pap test.** / Every 2 years from ages 21 through 29. Every 3 years starting at age 30 through age 65 or 70 with a history of 3 consecutive normal Pap tests.   HPV screening.** / Every 3 years from ages 30 through ages 65  to 70 with a history of 3 consecutive normal Pap tests.   Hepatitis C blood test.** / For any individual with known risks for hepatitis C.   Skin self-exam. / Monthly.   Influenza immunization.** / Every year.   Pneumococcal polysaccharide immunization.** / 1 to 2 doses if you smoke cigarettes or if you have certain chronic medical conditions.   Tetanus, diphtheria, pertussis (Tdap, Td) immunization. / A one-time dose of Tdap vaccine. After that, you need a Td booster dose every 10 years.   HPV immunization. / 3 doses over 6 months, if you are 26 and younger.   Measles, mumps, rubella (MMR) immunization. / You need at least 1 dose of MMR if you were born in 1957 or later. You may also need a second dose.   Meningococcal immunization. / 1 dose if you are age 19 to 21 and a first-year college student living in a residence hall, or have one of several medical conditions, you need to get vaccinated against meningococcal disease. You may also need additional booster doses.   Varicella immunization.** / Consult your caregiver.   Hepatitis A immunization.** / Consult your caregiver.   2 doses, 6 to 18 months apart.   Hepatitis B immunization.** / Consult your caregiver. 3 doses usually over 6 months.  Ages 40 to 64  Blood pressure check.** / Every 1 to 2 years.   Lipid and cholesterol check.** / Every 5 years beginning at age 20.   Clinical breast exam.** / Every year after age 40.   Mammogram.** / Every year beginning at age 40 and continuing for as long as you are in good health. Consult with your caregiver.   Pap test.** / Every 3 years starting at age 30 through age 65 or 70 with a history of 3 consecutive normal Pap tests.   HPV screening.** / Every 3 years from ages 30 through ages 65 to 70 with a history of 3 consecutive normal Pap tests.   Fecal occult blood test (FOBT) of stool. / Every year beginning at age 50 and continuing until age 75. You may not need to do this test if you  get a colonoscopy every 10 years.   Flexible sigmoidoscopy or colonoscopy.** / Every 5 years for a flexible sigmoidoscopy or every 10 years for a colonoscopy beginning at age 50 and continuing until age 75.   Hepatitis C blood test.** / For all people born from 1945 through 1965 and any individual with known risks for hepatitis C.   Skin self-exam. / Monthly.   Influenza immunization.** / Every year.   Pneumococcal polysaccharide immunization.** / 1 to 2 doses if you smoke cigarettes or if you have certain chronic medical conditions.   Tetanus, diphtheria, pertussis (Tdap, Td) immunization.** / A one-time dose of Tdap vaccine. After that, you need a Td booster dose every 10 years.   Measles, mumps, rubella (MMR) immunization. / You need at least 1 dose of MMR if you were born in 1957 or later. You may also need a second dose.   Varicella immunization.** / Consult your caregiver.   Meningococcal immunization.** / Consult your caregiver.   Hepatitis A immunization.** / Consult your caregiver. 2 doses, 6 to 18 months apart.   Hepatitis B immunization.** / Consult your caregiver. 3 doses, usually over 6 months.  Ages 65 and over  Blood pressure check.** / Every 1 to 2 years.   Lipid and cholesterol check.** / Every 5 years beginning at age 20.   Clinical breast exam.** / Every year after age 40.   Mammogram.** / Every year beginning at age 40 and continuing for as long as you are in good health. Consult with your caregiver.   Pap test.** / Every 3 years starting at age 30 through age 65 or 70 with a 3 consecutive normal Pap tests. Testing can be stopped between 65 and 70 with 3 consecutive normal Pap tests and no abnormal Pap or HPV tests in the past 10 years.   HPV screening.** / Every 3 years from ages 30 through ages 65 or 70 with a history of 3 consecutive normal Pap tests. Testing can be stopped between 65 and 70 with 3 consecutive normal Pap tests and no abnormal Pap or HPV tests  in the past 10 years.   Fecal occult blood test (FOBT) of stool. / Every year beginning at age 50 and continuing until age 75. You may not need to do this test if you get a colonoscopy every 10 years.   Flexible sigmoidoscopy or colonoscopy.** / Every 5 years for a flexible sigmoidoscopy or every 10 years for a colonoscopy beginning at age 50 and continuing   until age 75.   Hepatitis C blood test.** / For all people born from 1945 through 1965 and any individual with known risks for hepatitis C.   Osteoporosis screening.** / A one-time screening for women ages 65 and over and women at risk for fractures or osteoporosis.   Skin self-exam. / Monthly.   Influenza immunization.** / Every year.   Pneumococcal polysaccharide immunization.** / 1 dose at age 65 (or older) if you have never been vaccinated.   Tetanus, diphtheria, pertussis (Tdap, Td) immunization. / A one-time dose of Tdap vaccine if you are over 65 and have contact with an infant, are a healthcare worker, or simply want to be protected from whooping cough. After that, you need a Td booster dose every 10 years.   Varicella immunization.** / Consult your caregiver.   Meningococcal immunization.** / Consult your caregiver.   Hepatitis A immunization.** / Consult your caregiver. 2 doses, 6 to 18 months apart.   Hepatitis B immunization.** / Check with your caregiver. 3 doses, usually over 6 months.  ** Family history and personal history of risk and conditions may change your caregiver's recommendations. Document Released: 05/26/2001 Document Revised: 03/19/2011 Document Reviewed: 08/25/2010 [employer] Patient Information 2012 [employer]. 

## 2011-09-24 NOTE — Assessment & Plan Note (Signed)
Her K + level is low so I have sent an Rx to her pharmacy

## 2011-09-24 NOTE — Progress Notes (Signed)
  Subjective:    Patient ID: [name], female    DOB: 11/04/1965, 46 y.o.   MRN: [id number]  Hypertension This is a chronic problem. The current episode started more than 1 year ago. The problem has been gradually improving since onset. The problem is controlled. Pertinent negatives include no anxiety, blurred vision, chest pain, headaches, malaise/fatigue, neck pain, orthopnea, palpitations, peripheral edema, PND, shortness of breath or sweats. Agents associated with hypertension include NSAIDs. Past treatments include angiotensin blockers and diuretics. The current treatment provides significant improvement. There are no compliance problems.       Review of Systems  Constitutional: Negative for fever, chills, malaise/fatigue, diaphoresis, activity change, appetite change, fatigue and unexpected weight change.  HENT: Negative.  Negative for neck pain.   Eyes: Negative.  Negative for blurred vision.  Respiratory: Negative for apnea, cough, choking, chest tightness, shortness of breath, wheezing and stridor.   Cardiovascular: Negative for chest pain, palpitations, orthopnea, leg swelling and PND.  Gastrointestinal: Negative for nausea, vomiting, abdominal pain, diarrhea, constipation, blood in stool and abdominal distention.  Genitourinary: Negative.   Musculoskeletal: Negative for myalgias, back pain, joint swelling, arthralgias and gait problem.  Skin: Negative for color change, pallor, rash and wound.  Neurological: Negative for dizziness, tremors, seizures, syncope, facial asymmetry, speech difficulty, weakness, light-headedness, numbness and headaches.  Hematological: Negative for adenopathy. Does not bruise/bleed easily.  Psychiatric/Behavioral: Negative.        Objective:   Physical Exam  Vitals reviewed. Constitutional: She is oriented to person, place, and time. She appears well-developed and well-nourished. No distress.  HENT:  Head: Normocephalic and atraumatic.    Mouth/Throat: Oropharynx is clear and moist. No oropharyngeal exudate.  Eyes: Conjunctivae are normal. Right eye exhibits no discharge. Left eye exhibits no discharge. No scleral icterus.  Neck: Normal range of motion. Neck supple. No JVD present. No tracheal deviation present. No thyromegaly present.  Cardiovascular: Normal rate, regular rhythm, normal heart sounds and intact distal pulses.  Exam reveals no gallop and no friction rub.   No murmur heard. Pulmonary/Chest: Effort normal and breath sounds normal. No stridor. No respiratory distress. She has no wheezes. She has no rales. She exhibits no tenderness.  Abdominal: Soft. Bowel sounds are normal. She exhibits no distension and no mass. There is no tenderness. There is no rebound and no guarding.  Musculoskeletal: Normal range of motion. She exhibits no edema and no tenderness.  Lymphadenopathy:    She has no cervical adenopathy.  Neurological: She is oriented to person, place, and time.  Skin: Skin is warm and dry. No rash noted. She is not diaphoretic. No erythema. No pallor.  Psychiatric: She has a normal mood and affect. Her behavior is normal. Judgment and thought content normal.      Lab Results  Component Value Date   WBC 6.9 12/25/2010   HGB 13.3 12/25/2010   HCT 40.0 12/25/2010   PLT 257.0 12/25/2010   GLUCOSE 97 12/25/2010   CHOL 149 12/25/2010   TRIG 62.0 12/25/2010   HDL 57.50 12/25/2010   LDLCALC 79 12/25/2010   ALT 19 12/25/2010   AST 22 12/25/2010   NA 140 12/25/2010   K 4.7 12/25/2010   CL 105 12/25/2010   CREATININE 1.0 12/25/2010   BUN 12 12/25/2010   CO2 30 12/25/2010   TSH 2.18 12/25/2010   INR 1.0 05/25/2007   HGBA1C 6.0 12/25/2010      Assessment & Plan:

## 2011-09-24 NOTE — Addendum Note (Signed)
Addended by: Etta Grandchild on: 09/24/2011 03:50 PM   Modules accepted: Orders, Level of Service

## 2011-09-24 NOTE — Assessment & Plan Note (Signed)
Exam done, vaccines were reviewed and addressed, labs ordered, pt ed material was given

## 2011-09-28 ENCOUNTER — Telehealth: Payer: Self-pay

## 2011-09-28 NOTE — Telephone Encounter (Signed)
Message copied by Sandi Mealy on Mon Sep 28, 2011 10:46 AM ------      Message from: Etta Grandchild      Created: Thu Sep 24, 2011  3:51 PM       LA - please tell her that her potassium level is low so I sent a potassium supplement to her pharmacy, ask her to come in in 2-3 weeks for a recheck on her potassium level

## 2011-09-28 NOTE — Telephone Encounter (Signed)
Called patient to advise per MD/LMOVM

## 2011-10-14 ENCOUNTER — Ambulatory Visit: Payer: 59

## 2011-11-17 ENCOUNTER — Encounter: Payer: Self-pay | Admitting: *Deleted

## 2011-11-17 ENCOUNTER — Ambulatory Visit (INDEPENDENT_AMBULATORY_CARE_PROVIDER_SITE_OTHER): Payer: 59 | Admitting: Internal Medicine

## 2011-11-17 ENCOUNTER — Encounter: Payer: Self-pay | Admitting: Internal Medicine

## 2011-11-17 VITALS — BP 120/88 | HR 77 | Temp 98.3°F | Ht 63.0 in | Wt 282.1 lb

## 2011-11-17 DIAGNOSIS — I776 Arteritis, unspecified: Secondary | ICD-10-CM

## 2011-11-17 DIAGNOSIS — E669 Obesity, unspecified: Secondary | ICD-10-CM

## 2011-11-17 DIAGNOSIS — M300 Polyarteritis nodosa: Secondary | ICD-10-CM

## 2011-11-17 MED ORDER — METHYLPREDNISOLONE ACETATE 80 MG/ML IJ SUSP
120.0000 mg | Freq: Once | INTRAMUSCULAR | Status: AC
  Administered 2011-11-17: 120 mg via INTRAMUSCULAR

## 2011-11-17 NOTE — Progress Notes (Signed)
  Subjective:    Patient ID: [name], female    DOB: 1966-01-14, 46 y.o.   MRN: [id number]  HPI  complains of LLE pain consistent with prior flare of polyarteritis (hx same, dx 2010) Works with rheum on same, but would like to change providers as [facility] wanted to keep on chronic pred (off same since 2011)  Past Medical History  Diagnosis Date  . History of colonic polyps   . Headache   . Hypertension   . Osteoarthritis   . TIA (transient ischemic attack) 2007    hx of  . Obesity   . Vasculitis     L leg - Polyarthritis    Review of Systems  Constitutional: Negative for fever and fatigue.  Musculoskeletal: Negative for back pain, joint swelling and arthralgias.  Skin: Positive for rash.       Objective:   Physical Exam BP 120/88  Pulse 77  Temp 98.3 F (36.8 C) (Oral)  Ht 5\' 3"  (1.6 m)  Wt 282 lb 1.9 oz (127.969 kg)  BMI 49.98 kg/m2  SpO2 99% Wt Readings from Last 3 Encounters:  11/17/11 282 lb 1.9 oz (127.969 kg)  09/24/11 281 lb (127.461 kg)  08/07/11 279 lb (126.554 kg)   Constitutional: She is obese, but appears well-developed and well-nourished. No distress. nontoxic Neck: Normal range of motion. Neck supple. No JVD present. No thyromegaly present.  Cardiovascular: Normal rate, regular rhythm and normal heart sounds.  No murmur heard. No BLE edema. Pulmonary/Chest: Effort normal and breath sounds normal. No respiratory distress. She has no wheezes.  Musculoskeletal: Normal range of motion, no joint effusions. No gross deformities Skin: LLE with mild bruising with soft nodules on lateral shin. Skin is warm and dry. No rash noted. No erythema.  Psychiatric: She has a normal mood and affect. Her behavior is normal. Judgment and thought content normal.   Lab Results  Component Value Date   WBC 7.4 09/24/2011   HGB 12.4 09/24/2011   HCT 37.8 09/24/2011   PLT 284.0 09/24/2011   GLUCOSE 91 09/24/2011   CHOL 140 09/24/2011   TRIG 80.0 09/24/2011   HDL  49.20 09/24/2011   LDLCALC 75 09/24/2011   ALT 17 09/24/2011   AST 21 09/24/2011   NA 138 09/24/2011   K 3.2* 09/24/2011   CL 100 09/24/2011   CREATININE 1.1 09/24/2011   BUN 12 09/24/2011   CO2 27 09/24/2011   TSH 1.77 09/24/2011   INR 1.0 05/25/2007   HGBA1C 6.0 12/25/2010   Lab Results  Component Value Date   ANA NEG 03/20/2009        Assessment & Plan:  LLE vasculitis - polyarteritis flare  IM medrol now and refer to new rheum Work excuse note provided

## 2011-11-17 NOTE — Assessment & Plan Note (Signed)
The patient is asked to make an attempt to improve diet and exercise patterns to aid in medical management of this problem. Wt Readings from Last 3 Encounters:  11/17/11 282 lb 1.9 oz (127.969 kg)  09/24/11 281 lb (127.461 kg)  08/07/11 279 lb (126.554 kg)

## 2011-11-17 NOTE — Patient Instructions (Signed)
It was good to see you today. Medrol shot given today we'll make referral to new rheumatologist as requested . Our office will contact you regarding appointment(s) once made. Work excuse note yesterday-tomorrow, return Thursday 8/8 Continue to work on lifestyle changes as discussed (low fat, low carb, increased protein diet; improved exercise efforts; weight loss) to control sugar, blood pressure and cholesterol levels and/or reduce risk of developing other medical problems. Look into [email or web address] or other type of food journal to assist you in this process.

## 2011-12-11 ENCOUNTER — Ambulatory Visit: Payer: 59 | Admitting: Internal Medicine

## 2012-01-12 ENCOUNTER — Encounter (HOSPITAL_BASED_OUTPATIENT_CLINIC_OR_DEPARTMENT_OTHER): Payer: Self-pay

## 2012-01-12 ENCOUNTER — Emergency Department (HOSPITAL_BASED_OUTPATIENT_CLINIC_OR_DEPARTMENT_OTHER)
Admission: EM | Admit: 2012-01-12 | Discharge: 2012-01-12 | Disposition: A | Discharge status: Home / Self Care | Payer: 59 | Attending: Emergency Medicine | Admitting: Emergency Medicine

## 2012-01-12 DIAGNOSIS — M545 Low back pain, unspecified: Secondary | ICD-10-CM | POA: Insufficient documentation

## 2012-01-12 DIAGNOSIS — I10 Essential (primary) hypertension: Secondary | ICD-10-CM | POA: Insufficient documentation

## 2012-01-12 DIAGNOSIS — S39012A Strain of muscle, fascia and tendon of lower back, initial encounter: Secondary | ICD-10-CM

## 2012-01-12 DIAGNOSIS — Z8673 Personal history of transient ischemic attack (TIA), and cerebral infarction without residual deficits: Secondary | ICD-10-CM | POA: Insufficient documentation

## 2012-01-12 DIAGNOSIS — M1990 Unspecified osteoarthritis, unspecified site: Secondary | ICD-10-CM | POA: Insufficient documentation

## 2012-01-12 HISTORY — DX: Reserved for concepts with insufficient information to code with codable children: IMO0002

## 2012-01-12 MED ORDER — OXYCODONE-ACETAMINOPHEN 5-325 MG PO TABS: 1.0000 | ORAL_TABLET | ORAL | Status: DC | PRN

## 2012-01-12 MED ORDER — KETOROLAC TROMETHAMINE 60 MG/2ML IM SOLN
60.0000 mg | Freq: Once | INTRAMUSCULAR | Status: AC
  Administered 2012-01-12: 60 mg via INTRAMUSCULAR

## 2012-01-12 MED ORDER — CYCLOBENZAPRINE HCL 10 MG PO TABS
10.0000 mg | ORAL_TABLET | Freq: Once | ORAL | Status: AC
  Administered 2012-01-12: 10 mg via ORAL

## 2012-01-12 MED ORDER — CYCLOBENZAPRINE HCL 10 MG PO TABS: 10.0000 mg | ORAL_TABLET | Freq: Two times a day (BID) | ORAL | Status: DC | PRN

## 2012-01-12 MED ORDER — OXYCODONE-ACETAMINOPHEN 5-325 MG PO TABS
2.0000 | ORAL_TABLET | Freq: Once | ORAL | Status: AC
  Administered 2012-01-12: 2 via ORAL
  Filled 2012-01-12: qty 2

## 2012-01-12 NOTE — ED Provider Notes (Signed)
History     CSN: [id number]  Arrival date & time 01/12/12  0911   First MD Initiated Contact with Patient 01/12/12 [phone]      Chief Complaint  Patient presents with  . Back Pain    (Consider location/radiation/quality/duration/timing/severity/associated sxs/prior treatment) HPI Patient planning of back pain when she twisted today at the water cooler. She states the pain came across her entire low back with radiation down the bilateral lateral buttocks. She has not had a numbness or tingling. She has not had any loss of strength. She denies any loss of bowel or bladder control. She denies any increased urinary frequency Past Medical History  Diagnosis Date  . History of colonic polyps   . Headache   . Hypertension   . Osteoarthritis   . TIA (transient ischemic attack) 2007    hx of  . Obesity   . Vasculitis     L leg - Polyarthritis  . Herniated disc     Past Surgical History  Procedure Date  . Abdominal hysterectomy     Family History  Problem Relation Age of Onset  . Arthritis Other   . Hypertension Other   . Stroke Other   . Stroke Mother   . Hypertension Mother   . Hypertension Father   . Cancer Father     Prostate    History  Substance Use Topics  . Smoking status: Never Smoker   . Smokeless tobacco: Not on file  . Alcohol Use: Yes     occasionally    OB History    Grav Para Term Preterm Abortions TAB SAB Ect Mult Living                  Review of Systems  Constitutional: Negative for fever, chills, activity change, appetite change and unexpected weight change.  HENT: Negative for sore throat, rhinorrhea, neck pain, neck stiffness and sinus pressure.   Eyes: Negative for visual disturbance.  Respiratory: Negative for cough and shortness of breath.   Cardiovascular: Negative for chest pain and leg swelling.  Gastrointestinal: Negative for vomiting, abdominal pain, diarrhea and blood in stool.  Genitourinary: Negative for dysuria, urgency,  frequency, vaginal discharge and difficulty urinating.  Musculoskeletal: Positive for back pain. Negative for myalgias, arthralgias and gait problem.  Skin: Negative for color change and rash.  Neurological: Negative for weakness, light-headedness, numbness and headaches.  Hematological: Does not bruise/bleed easily.  Psychiatric/Behavioral: Negative for dysphoric mood.    Allergies  Review of patient's allergies indicates no known allergies.  Home Medications   Current Outpatient Rx  Name Route Sig Dispense Refill  . LOSARTAN POTASSIUM-HCTZ 100-25 MG PO TABS Oral Take 1 tablet by mouth daily. 90 tablet 3  . NAPROXEN 500 MG PO TABS Oral Take 1 tablet (500 mg total) by mouth 2 (two) times daily with a meal. 60 tablet 1  . POTASSIUM CHLORIDE CRYS ER 15 MEQ PO TBCR Oral Take 1 tablet (15 mEq total) by mouth 2 (two) times daily. 60 tablet 11  . ZOLMITRIPTAN 2.5 MG PO TBDP Oral Take 1 tablet (2.5 mg total) by mouth as needed for migraine. 4 tablet 0    BP 124/62  Pulse 86  Temp 98.2 F (36.8 C) (Oral)  Resp 20  SpO2 98%  Physical Exam  Nursing note and vitals reviewed. Constitutional: She appears well-developed and well-nourished.       Morbidly obese female who appears to be uncomfortable  HENT:  Head: Normocephalic and atraumatic.  Eyes:  Conjunctivae normal and EOM are normal. Pupils are equal, round, and reactive to light.  Neck: Normal range of motion. Neck supple.  Cardiovascular: Normal rate, regular rhythm, normal heart sounds and intact distal pulses.   Pulmonary/Chest: Effort normal and breath sounds normal.  Abdominal: Soft. Bowel sounds are normal.  Musculoskeletal: Normal range of motion.       Diffuse tenderness palpable in low back without any erythema, swelling, or spinal abnormality noted.  Neurological: She is alert. She has normal strength. She displays a negative Romberg sign. GCS eye subscore is 4. GCS verbal subscore is 5. GCS motor subscore is 6.        Strength is 5 out of 5 bilateral hips knees ankles and toes. Sensation is intact throughout peroneal area and lower extremities. Deep tendon reflexes are equal bilateral lower extremities. Dorsal talus pulses are intact bilaterally. Toes are pink and capillary refill is less than 2 seconds.  Skin: Skin is warm and dry.  Psychiatric: She has a normal mood and affect. Thought content normal.    ED Course  Procedures (including critical care time)  Labs Reviewed - No data to display No results found.   No diagnosis found.    Patient with onset of low back pain with twisting movement today. Patient was given pain medicine and muscle relaxants here with good response. Patient feels improved. She has a primary care Dr. and will followup with her primary care Dr. She is cautioned regarding signs or symptoms of acute neurological deficit and to return immediately if she becomes suddenly weak or has loss of bowel or bladder control.  MDM Number of Diagnoses or Management Options Patient with back pain.  No neurological deficits and normal neuro exam.  Patient can walk but states is painful.  No loss of bowel or bladder control.  No concern for cauda equina.  No fever, night sweats, weight loss, h/o cancer, IVDU.  RICE protocol and pain medicine indicated and discussed with patient.         Hilario Quarry, MD 01/12/12 1023

## 2012-01-12 NOTE — ED Notes (Addendum)
Pt sts Hx of herniated disc.  Pain started 2 days ago after "slightly bending over."  Pt sts pain has become increasing worse.  Pt sts this AM she "slightly bent over and felt multiple back spasms."  Pt sts pain level currently 10 out of 10.

## 2012-01-12 NOTE — ED Notes (Signed)
Per EMS, Pt has Hx of herniated disc. Pain x 2 days increasing this AM.  Denies injury.

## 2012-01-15 ENCOUNTER — Ambulatory Visit: Payer: 59 | Admitting: Internal Medicine

## 2012-01-15 DIAGNOSIS — Z0289 Encounter for other administrative examinations: Secondary | ICD-10-CM

## 2012-01-20 ENCOUNTER — Ambulatory Visit (INDEPENDENT_AMBULATORY_CARE_PROVIDER_SITE_OTHER)
Admission: RE | Admit: 2012-01-20 | Discharge: 2012-01-20 | Disposition: A | Discharge status: Home / Self Care | Payer: 59 | Source: Ambulatory Visit | Attending: Internal Medicine | Admitting: Internal Medicine

## 2012-01-20 ENCOUNTER — Ambulatory Visit (INDEPENDENT_AMBULATORY_CARE_PROVIDER_SITE_OTHER): Payer: 59 | Admitting: Internal Medicine

## 2012-01-20 ENCOUNTER — Encounter: Payer: Self-pay | Admitting: Internal Medicine

## 2012-01-20 ENCOUNTER — Other Ambulatory Visit (INDEPENDENT_AMBULATORY_CARE_PROVIDER_SITE_OTHER): Payer: 59

## 2012-01-20 VITALS — BP 110/80 | HR 92 | Temp 98.1°F | Resp 16 | Wt 276.0 lb

## 2012-01-20 DIAGNOSIS — R05 Cough: Secondary | ICD-10-CM

## 2012-01-20 DIAGNOSIS — Z23 Encounter for immunization: Secondary | ICD-10-CM

## 2012-01-20 DIAGNOSIS — R059 Cough, unspecified: Secondary | ICD-10-CM

## 2012-01-20 DIAGNOSIS — J069 Acute upper respiratory infection, unspecified: Secondary | ICD-10-CM | POA: Insufficient documentation

## 2012-01-20 DIAGNOSIS — E876 Hypokalemia: Secondary | ICD-10-CM

## 2012-01-20 DIAGNOSIS — I10 Essential (primary) hypertension: Secondary | ICD-10-CM

## 2012-01-20 LAB — BASIC METABOLIC PANEL
Calcium: 9.1 mg/dL (ref 8.4–10.5)
GFR: 64.99 mL/min (ref >60.00)
Glucose, Bld: 93 mg/dL (ref 70–99)
Potassium: 3.3 mEq/L — ABNORMAL LOW (ref 3.5–5.1)
Sodium: 138 mEq/L (ref 135–145)

## 2012-01-20 LAB — CBC WITH DIFFERENTIAL/PLATELET
Basophils Absolute: 0 10*3/uL (ref 0.0–0.1)
Eosinophils Relative: 3.7 % (ref 0.0–5.0)
Lymphs Abs: 1.8 10*3/uL (ref 0.7–4.0)
MCV: 83.2 fl (ref 78.0–100.0)
Monocytes Absolute: 0.8 10*3/uL (ref 0.1–1.0)
Monocytes Relative: 10 % (ref 3.0–12.0)
Neutrophils Relative %: 63.3 % (ref 43.0–77.0)
Platelets: 274 10*3/uL (ref 150.0–400.0)
RDW: 15.6 % — ABNORMAL HIGH (ref 11.5–14.6)
WBC: 7.9 10*3/uL (ref 4.5–10.5)

## 2012-01-20 LAB — MAGNESIUM: Magnesium: 2 mg/dL (ref 1.5–2.5)

## 2012-01-20 MED ORDER — HYDROCOD POLST-CPM POLST ER 10-8 MG PO CP12: 1.0000 | ORAL_CAPSULE | Freq: Two times a day (BID) | ORAL | Status: DC | PRN

## 2012-01-20 NOTE — Progress Notes (Signed)
Subjective:    Patient ID: [name], female    DOB: 06/17/1965, 46 y.o.   MRN: [id number]  Cough This is a new problem. The current episode started in the past 7 days. The problem has been gradually worsening. The problem occurs every few hours. The cough is non-productive. Associated symptoms include chills and a sore throat. Pertinent negatives include no chest pain, ear congestion, ear pain, fever, headaches, heartburn, hemoptysis, myalgias, nasal congestion, postnasal drip, rash, rhinorrhea, shortness of breath, sweats, weight loss or wheezing. Nothing aggravates the symptoms. She has tried OTC cough suppressant for the symptoms. The treatment provided mild relief.      Review of Systems  Constitutional: Positive for chills. Negative for fever, weight loss, diaphoresis, activity change, appetite change, fatigue and unexpected weight change.  HENT: Positive for sore throat. Negative for ear pain, rhinorrhea and postnasal drip.   Eyes: Negative.   Respiratory: Positive for cough. Negative for apnea, hemoptysis, choking, chest tightness, shortness of breath, wheezing and stridor.   Cardiovascular: Negative for chest pain, palpitations and leg swelling.  Gastrointestinal: Negative for heartburn, nausea, vomiting, abdominal pain, diarrhea and constipation.  Genitourinary: Negative.   Musculoskeletal: Negative for myalgias, back pain, joint swelling and gait problem.  Skin: Negative for color change, pallor, rash and wound.  Neurological: Negative.  Negative for headaches.  Hematological: Negative for adenopathy. Does not bruise/bleed easily.  Psychiatric/Behavioral: Negative.        Objective:   Physical Exam  Vitals reviewed. Constitutional: She is oriented to person, place, and time. She appears well-developed and well-nourished.  Non-toxic appearance. She does not have a sickly appearance. She does not appear ill. No distress.  HENT:  Head: Normocephalic and atraumatic.    Mouth/Throat: Oropharynx is clear and moist. No oropharyngeal exudate.  Eyes: Conjunctivae normal are normal. Right eye exhibits no discharge. Left eye exhibits no discharge. No scleral icterus.  Neck: Normal range of motion. Neck supple. No JVD present. No tracheal deviation present. No thyromegaly present.  Cardiovascular: Normal rate, regular rhythm, normal heart sounds and intact distal pulses.  Exam reveals no gallop and no friction rub.   No murmur heard. Pulmonary/Chest: Effort normal and breath sounds normal. No accessory muscle usage or stridor. Not tachypneic. No respiratory distress. She has no decreased breath sounds. She has no wheezes. She has no rhonchi. She has no rales. She exhibits no tenderness.  Abdominal: Soft. Bowel sounds are normal. She exhibits no distension and no mass. There is no tenderness. There is no rebound and no guarding.  Musculoskeletal: Normal range of motion. She exhibits no edema and no tenderness.  Lymphadenopathy:    She has no cervical adenopathy.  Neurological: She is oriented to person, place, and time.  Skin: Skin is warm and dry. No rash noted. She is not diaphoretic. No erythema. No pallor.  Psychiatric: She has a normal mood and affect. Her behavior is normal. Judgment and thought content normal.      Lab Results  Component Value Date   WBC 7.4 09/24/2011   HGB 12.4 09/24/2011   HCT 37.8 09/24/2011   PLT 284.0 09/24/2011   GLUCOSE 91 09/24/2011   CHOL 140 09/24/2011   TRIG 80.0 09/24/2011   HDL 49.20 09/24/2011   LDLCALC 75 09/24/2011   ALT 17 09/24/2011   AST 21 09/24/2011   NA 138 09/24/2011   K 3.2* 09/24/2011   CL 100 09/24/2011   CREATININE 1.1 09/24/2011   BUN 12 09/24/2011   CO2 27 09/24/2011  TSH 1.77 09/24/2011   INR 1.0 05/25/2007   HGBA1C 6.0 12/25/2010      Assessment & Plan:

## 2012-01-20 NOTE — Assessment & Plan Note (Signed)
All the indicators are that this a viral infection, I will check her CXR and is he has PNA will start antibiotics, she will try tussicaps for symptom relief

## 2012-01-20 NOTE — Assessment & Plan Note (Signed)
I will check her CXR to see if she has PNA 

## 2012-01-20 NOTE — Patient Instructions (Signed)
Hypokalemia Hypokalemia means a low potassium level in the blood.Potassium is an electrolyte that helps regulate the amount of fluid in the body. It also stimulates muscle contraction and maintains a stable acid-base balance.Most of the body's potassium is inside of cells, and only a very small amount is in the blood. Because the amount in the blood is so small, minor changes can have big effects. PREPARATION FOR TEST Testing for potassium requires taking a blood sample taken by needle from a vein in the arm. The skin is cleaned thoroughly before the sample is drawn. There is no other special preparation needed. NORMAL VALUES Potassium levels below 3.5 mEq/L are abnormally low. Levels above 5.1 mEq/L are abnormally high. Ranges for normal findings may vary among different laboratories and hospitals. You should always check with your doctor after having lab work or other tests done to discuss the meaning of your test results and whether your values are considered within normal limits. MEANING OF TEST  Your caregiver will go over the test results with you and discuss the importance and meaning of your results, as well as treatment options and the need for additional tests, if necessary. A potassium level is frequently part of a routine medical exam. It is usually included as part of a whole "panel" of tests for several blood salts (such as Sodium and Chloride). It may be done as part of follow-up when a low potassium level was found in the past or other blood salts are suspected of being out of balance. A low potassium level might be suspected if you have one or more of the following:  Symptoms of weakness.  Abnormal heart rhythms.  High blood pressure and are taking medication to control this, especially water pills (diuretics).  Kidney disease that can affect your potassium level .  Diabetes requiring the use of insulin. The potassium may fall after taking insulin, especially if the diabetes  had been out of control for a while.  A condition requiring the use of cortisone-type medication or certain types of antibiotics.  Vomiting and/or diarrhea for more than a day or two.  A stomach or intestinal condition that may not permit appropriate absorption of potassium.  Fainting episodes.  Mental confusion. OBTAINING TEST RESULTS It is your responsibility to obtain your test results. Ask the lab or department performing the test when and how you will get your results.  Please contact your caregiver directly if you have not received the results within one week. At that time, ask if there is anything different or new you should be doing in relation to the results. TREATMENT Hypokalemia can be treated with potassium supplements taken by mouth and/or adjustments in your current medications. A diet high in potassium is also helpful. Foods with high potassium content are:  Peas, lentils, lima beans, nuts, and dried fruit.  Whole grain and bran cereals and breads.  Fresh fruit, vegetables (bananas, cantaloupe, grapefruit, oranges, tomatoes, honeydew melons, potatoes).  Orange and tomato juices.  Meats. If potassium supplement has been prescribed for you today or your medications have been adjusted, see your personal caregiver in time02 for a re-check. SEEK MEDICAL CARE IF:  There is a feeling of worsening weakness.  You experience repeated chest palpitations.  You are diabetic and having difficulty keeping your blood sugars in the normal range.  You are experiencing vomiting and/or diarrhea.  You are having difficulty with any of your regular medications. SEEK IMMEDIATE MEDICAL CARE IF:  You experience chest pain, shortness of   breath, or episodes of dizziness.  You have been having vomiting or diarrhea for more than 2 days.  You have a fainting episode. MAKE SURE YOU:   Understand these instructions.  Will watch your condition.  Will get help right away if you are  not doing well or get worse. Document Released: 03/30/2005 Document Revised: 06/22/2011 Document Reviewed: 03/10/2008 [facility] Patient Information 2013 [place], [place]. Upper Respiratory Infection, Adult An upper respiratory infection (URI) is also known as the common cold. It is often caused by a type of germ (virus). Colds are easily spread (contagious). You can pass it to others by kissing, coughing, sneezing, or drinking out of the same glass. Usually, you get better in 1 or 2 weeks.  HOME CARE   Only take medicine as told by your doctor.  Use a warm mist humidifier or breathe in steam from a hot shower.  Drink enough water and fluids to keep your pee (urine) clear or pale yellow.  Get plenty of rest.  Return to work when your temperature is back to normal or as told by your doctor. You may use a face mask and wash your hands to stop your cold from spreading. GET HELP RIGHT AWAY IF:   After the first few days, you feel you are getting worse.  You have questions about your medicine.  You have chills, shortness of breath, or brown or red spit (mucus).  You have yellow or brown snot (nasal discharge) or pain in the face, especially when you bend forward.  You have a fever, puffy (swollen) neck, pain when you swallow, or white spots in the back of your throat.  You have a bad headache, ear pain, sinus pain, or chest pain.  You have a high-pitched whistling sound when you breathe in and out (wheezing).  You have a lasting cough or cough up blood.  You have sore muscles or a stiff neck. MAKE SURE YOU:   Understand these instructions.  Will watch your condition.  Will get help right away if you are not doing well or get worse. Document Released: 09/16/2007 Document Revised: 06/22/2011 Document Reviewed: 08/04/2010 [facility] Patient Information 2013 [place], [place].

## 2012-01-20 NOTE — Assessment & Plan Note (Signed)
I will check her K+ and Mg++ levels today 

## 2012-01-20 NOTE — Assessment & Plan Note (Signed)
Her BP is well controlled, I will check her lytes and renal function 

## 2012-01-22 ENCOUNTER — Ambulatory Visit: Payer: 59 | Admitting: Internal Medicine

## 2012-02-10 ENCOUNTER — Ambulatory Visit: Payer: 59 | Admitting: Internal Medicine

## 2012-02-10 DIAGNOSIS — Z0289 Encounter for other administrative examinations: Secondary | ICD-10-CM

## 2012-08-03 ENCOUNTER — Telehealth: Payer: Self-pay | Admitting: Internal Medicine

## 2012-08-03 NOTE — Telephone Encounter (Signed)
LMOVM to pick up 

## 2012-08-03 NOTE — Telephone Encounter (Signed)
Patient is needing a note to take to work stating that she is able to work.

## 2012-08-03 NOTE — Telephone Encounter (Signed)
Note written

## 2012-08-03 NOTE — Telephone Encounter (Signed)
Spoke with patient.  She stated that she had an MRI done and was told she had a small herniated disc.  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  They are wanting a letter stating that she is able to do this.

## 2012-08-03 NOTE — Telephone Encounter (Signed)
Has she been out of work? What is going on?

## 2012-09-22 ENCOUNTER — Ambulatory Visit: Payer: 59 | Admitting: Internal Medicine

## 2012-09-23 ENCOUNTER — Other Ambulatory Visit (INDEPENDENT_AMBULATORY_CARE_PROVIDER_SITE_OTHER): Payer: 59

## 2012-09-23 ENCOUNTER — Encounter: Payer: Self-pay | Admitting: Internal Medicine

## 2012-09-23 ENCOUNTER — Ambulatory Visit (INDEPENDENT_AMBULATORY_CARE_PROVIDER_SITE_OTHER): Payer: 59 | Admitting: Internal Medicine

## 2012-09-23 VITALS — BP 120/78 | HR 96 | Temp 98.3°F | Resp 16 | Ht 63.0 in | Wt 283.5 lb

## 2012-09-23 DIAGNOSIS — I10 Essential (primary) hypertension: Secondary | ICD-10-CM

## 2012-09-23 DIAGNOSIS — E876 Hypokalemia: Secondary | ICD-10-CM

## 2012-09-23 LAB — MAGNESIUM: Magnesium: 1.8 mg/dL (ref 1.5–2.5)

## 2012-09-23 LAB — BASIC METABOLIC PANEL
BUN: 18 mg/dL (ref 6–23)
Chloride: 103 mEq/L (ref 96–112)
GFR: 84.66 mL/min (ref >60.00)
Potassium: 3.6 mEq/L (ref 3.5–5.1)
Sodium: 139 mEq/L (ref 135–145)

## 2012-09-23 MED ORDER — POTASSIUM CHLORIDE CRYS ER 15 MEQ PO TBCR: 15.0000 meq | EXTENDED_RELEASE_TABLET | Freq: Two times a day (BID) | ORAL | Status: DC

## 2012-09-23 MED ORDER — LORCASERIN HCL 10 MG PO TABS: 1.0000 | ORAL_TABLET | Freq: Two times a day (BID) | ORAL | Status: DC

## 2012-09-23 MED ORDER — LOSARTAN POTASSIUM-HCTZ 100-25 MG PO TABS: 1.0000 | ORAL_TABLET | Freq: Every day | ORAL | Status: DC

## 2012-09-23 NOTE — Assessment & Plan Note (Signed)
Her BP is well controlled Today I will check her lytes and renal function 

## 2012-09-23 NOTE — Assessment & Plan Note (Signed)
Her K+ and Mg++ are better She will continue the same K= replacement for now

## 2012-09-23 NOTE — Patient Instructions (Signed)

## 2012-09-23 NOTE — Progress Notes (Signed)
  Subjective:    Patient ID: [name], female    DOB: 1965/08/08, 47 y.o.   MRN: [id number]  Hypertension This is a chronic problem. The current episode started more than 1 year ago. The problem has been rapidly improving since onset. The problem is controlled. Pertinent negatives include no anxiety, blurred vision, chest pain, headaches, malaise/fatigue, neck pain, orthopnea, palpitations, peripheral edema, PND, shortness of breath or sweats. Past treatments include angiotensin blockers and diuretics. Compliance problems include exercise and diet.       Review of Systems  Constitutional: Positive for unexpected weight change (weight gain). Negative for fever, chills, malaise/fatigue, diaphoresis, activity change, appetite change and fatigue.  HENT: Negative.  Negative for neck pain.   Eyes: Negative.  Negative for blurred vision.  Respiratory: Negative.  Negative for cough, chest tightness, shortness of breath, wheezing and stridor.   Cardiovascular: Negative.  Negative for chest pain, palpitations, orthopnea, leg swelling and PND.  Gastrointestinal: Negative.  Negative for nausea, vomiting, abdominal pain, diarrhea and constipation.  Endocrine: Negative.   Genitourinary: Negative.   Musculoskeletal: Negative for myalgias, back pain, joint swelling and gait problem.  Skin: Negative.  Negative for rash.  Allergic/Immunologic: Negative.   Neurological: Negative.  Negative for dizziness and headaches.  Hematological: Negative.  Negative for adenopathy. Does not bruise/bleed easily.  Psychiatric/Behavioral: Negative.        Objective:   Physical Exam  Vitals reviewed. Constitutional: She is oriented to person, place, and time. She appears well-developed and well-nourished. No distress.  HENT:  Head: Normocephalic and atraumatic.  Mouth/Throat: Oropharynx is clear and moist. No oropharyngeal exudate.  Eyes: Conjunctivae are normal. Right eye exhibits no discharge. Left eye  exhibits no discharge. No scleral icterus.  Neck: Normal range of motion. Neck supple. No JVD present. No tracheal deviation present. No thyromegaly present.  Cardiovascular: Normal rate, regular rhythm, normal heart sounds and intact distal pulses.  Exam reveals no gallop and no friction rub.   No murmur heard. Pulmonary/Chest: Effort normal and breath sounds normal. No stridor. No respiratory distress. She has no wheezes. She has no rales. She exhibits no tenderness.  Abdominal: Soft. Bowel sounds are normal. She exhibits no distension and no mass. There is no tenderness. There is no rebound and no guarding.  Musculoskeletal: She exhibits no edema and no tenderness.  Lymphadenopathy:    She has no cervical adenopathy.  Neurological: She is oriented to person, place, and time.  Skin: Skin is warm and dry. No rash noted. She is not diaphoretic. No erythema. No pallor.  Psychiatric: She has a normal mood and affect. Her behavior is normal. Judgment and thought content normal.     Lab Results  Component Value Date   WBC 7.9 01/20/2012   HGB 12.4 01/20/2012   HCT 37.9 01/20/2012   PLT 274.0 01/20/2012   GLUCOSE 93 01/20/2012   CHOL 140 09/24/2011   TRIG 80.0 09/24/2011   HDL 49.20 09/24/2011   LDLCALC 75 09/24/2011   ALT 17 09/24/2011   AST 21 09/24/2011   NA 138 01/20/2012   K 3.3* 01/20/2012   CL 99 01/20/2012   CREATININE 1.2 01/20/2012   BUN 13 01/20/2012   CO2 31 01/20/2012   TSH 1.77 09/24/2011   INR 1.0 05/25/2007   HGBA1C 6.0 12/25/2010       Assessment & Plan:

## 2012-09-23 NOTE — Assessment & Plan Note (Signed)
In addition to her lifestyle modifications she will try belviq

## 2012-09-27 ENCOUNTER — Encounter: Payer: Self-pay | Admitting: Internal Medicine

## 2012-09-27 ENCOUNTER — Ambulatory Visit (INDEPENDENT_AMBULATORY_CARE_PROVIDER_SITE_OTHER): Payer: 59 | Admitting: Internal Medicine

## 2012-09-27 VITALS — BP 112/76 | HR 84 | Temp 98.8°F | Ht 63.0 in | Wt 284.0 lb

## 2012-09-27 DIAGNOSIS — I776 Arteritis, unspecified: Secondary | ICD-10-CM

## 2012-09-27 MED ORDER — METHYLPREDNISOLONE ACETATE 80 MG/ML IJ SUSP
80.0000 mg | Freq: Once | INTRAMUSCULAR | Status: AC
  Administered 2012-09-27: 80 mg via INTRAMUSCULAR

## 2012-09-27 MED ORDER — METHYLPREDNISOLONE ACETATE 40 MG/ML IJ SUSP
40.0000 mg | Freq: Once | INTRAMUSCULAR | Status: AC
  Administered 2012-09-27: 40 mg via INTRAMUSCULAR

## 2012-09-27 NOTE — Progress Notes (Signed)
Subjective:    Patient ID: [name], female    DOB: [birth year], 47 y.o.   MRN: [id number]  HPI  Pt presents to the clinic today with c/o bilateral lower leg edema and painful knots. This is a chronic issue for her. She was diagnosed with polyarteritis in 2010. She followed with rheumatology but did not like being on the prednisone. This flare started 3 days ago. She has not taken anything OTC for this. She is on Hyazzr for blood pressure control. This does not help with the swelling when she has these flares.  Review of Systems      Past Medical History  Diagnosis Date  . History of colonic polyps   . Headache(784.0)   . Hypertension   . Osteoarthritis   . TIA (transient ischemic attack) 2007    hx of  . Obesity   . Vasculitis     L leg - Polyarthritis  . Herniated disc     Current Outpatient Prescriptions  Medication Sig Dispense Refill  . Lorcaserin HCl (BELVIQ) 10 MG TABS Take 1 tablet by mouth 2 (two) times daily.  30 tablet  0  . Lorcaserin HCl (BELVIQ) 10 MG TABS Take 1 tablet by mouth 2 (two) times daily.  60 tablet  1  . losartan-hydrochlorothiazide (HYZAAR) 100-25 MG per tablet Take 1 tablet by mouth daily.  90 tablet  1  . potassium chloride SA (KLOR-CON M15) 15 MEQ tablet Take 1 tablet (15 mEq total) by mouth 2 (two) times daily.  60 tablet  11  . zolmitriptan (ZOMIG ZMT) 2.5 MG disintegrating tablet Take 1 tablet (2.5 mg total) by mouth as needed for migraine.  4 tablet  0   No current facility-administered medications for this visit.    No Known Allergies  Family History  Problem Relation Age of Onset  . Arthritis Other   . Hypertension Other   . Stroke Other   . Stroke Mother   . Hypertension Mother   . Hypertension Father   . Cancer Father     Prostate    History   Social History  . Marital Status: Single    Spouse Name: N/A    Number of Children: N/A  . Years of Education: N/A   Occupational History  . Not on file.   Social  History Main Topics  . Smoking status: Never Smoker   . Smokeless tobacco: Not on file  . Alcohol Use: No     Comment: occasionally  . Drug Use: No  . Sexually Active: Yes    Birth Control/ Protection: Surgical   Other Topics Concern  . Not on file   Social History Narrative  . No narrative on file     Constitutional: Denies fever, malaise, fatigue, headache or abrupt weight changes. t. Respiratory: Denies difficulty breathing, shortness of breath, cough or sputum production.   Cardiovascular: Denies chest pain, chest tightness, palpitations or swelling in the hands or feet.  Musculoskeletal: Denies decrease in range of motion, difficulty with gait, muscle pain or joint pain and swelling.  Skin: Pt reports redness and warmth of BLE. Denies  rashes, lesions or ulcercations.    No other specific complaints in a complete review of systems (except as listed in HPI above).  Objective:   Physical Exam  BP 112/76  Pulse 84  Temp(Src) 98.8 F (37.1 C) (Oral)  Ht 5\' 3"  (1.6 m)  Wt 284 lb (128.822 kg)  BMI 50.32 kg/m2  SpO2 98% Wt Readings  from Last 3 Encounters:  09/27/12 284 lb (128.822 kg)  09/23/12 283 lb 8 oz (128.595 kg)  01/20/12 276 lb (125.193 kg)    General: Appears her stated age, well developed, well nourished in NAD. Skin: Warm, dry and intact. No rashes, lesions or ulcerations noted. Vasculitis noted on BLE. Cardiovascular: Normal rate and rhythm. S1,S2 noted.  No murmur, rubs or gallops noted. No JVD or BLE edema. No carotid bruits noted. Pulmonary/Chest: Normal effort and positive vesicular breath sounds. No respiratory distress. No wheezes, rales or ronchi noted.  Musculoskeletal: Normal range of motion. No signs of joint swelling. No difficulty with gait.  Neurological: Alert and oriented. Cranial nerves II-XII intact. Coordination normal. +DTRs bilaterally.   BMET    Component Value Date/Time   NA 139 09/23/2012 0949   K 3.6 09/23/2012 0949   CL 103  09/23/2012 0949   CO2 31 09/23/2012 0949   GLUCOSE 99 09/23/2012 0949   BUN 18 09/23/2012 0949   CREATININE 0.9 09/23/2012 0949   CALCIUM 9.4 09/23/2012 0949   GFRNONAA 88.26 03/20/2009 1506   GFRAA  Value: >60        The eGFR has been calculated using the MDRD equation. This calculation has not been validated in all clinical 05/25/2007 1446    Lipid Panel     Component Value Date/Time   CHOL 140 09/24/2011 1416   TRIG 80.0 09/24/2011 1416   HDL 49.20 09/24/2011 1416   CHOLHDL 3 09/24/2011 1416   VLDL 16.0 09/24/2011 1416   LDLCALC 75 09/24/2011 1416    CBC    Component Value Date/Time   WBC 7.9 01/20/2012 1113   RBC 4.55 01/20/2012 1113   HGB 12.4 01/20/2012 1113   HCT 37.9 01/20/2012 1113   PLT 274.0 01/20/2012 1113   MCV 83.2 01/20/2012 1113   MCHC 32.8 01/20/2012 1113   RDW 15.6* 01/20/2012 1113   LYMPHSABS 1.8 01/20/2012 1113   MONOABS 0.8 01/20/2012 1113   EOSABS 0.3 01/20/2012 1113   BASOSABS 0.0 01/20/2012 1113    Hgb A1C Lab Results  Component Value Date   HGBA1C 6.0 12/25/2010         Assessment & Plan:

## 2012-09-27 NOTE — Assessment & Plan Note (Signed)
Will give 120 mg depo IM today Continue to monitor symptoms If worse RTC

## 2012-09-27 NOTE — Patient Instructions (Addendum)
Vasculitis Vasculitis is when your blood vessels are inflamed. There are many different blood vessels in the body, and vasculitis can affect any of them. This includes large (veins and arteries) and small (capillaries) vessels. With vasculitis,   Blood vessel walls can become thick.  Blood vessels can become narrow.  Blood vessels can become weak. Sometimes, it becomes so weak that the blood vessel bulges out like a balloon. This is called an aneurysm. Aneurysms are rare but can be life-threatening.  Scarring can occur.  Not enough blood can flow through the blood vessels. All of these things can damage many parts of the body, including the muscles, kidneys, lungs and brain. There are many types of vasculitis. Some types are short-term (acute), while others are long-term (chronic). Some types may go away without treatment, and others may need to be treated for a long time.  CAUSES  Vasculitis occurs when the body's immune system (which fights germs and disease) makes a mistake. It attacks its own blood vessels. This causes inflammation (the body's way of reacting to injury or infection).   Why this happens is usually not known. The condition is then called primary vasculitis.  Sometimes, something triggers the inflammation. This is called secondary vasculitis. Possible causes include:  Infections.  An immune system disease. Examples include lupus, rheumatoid arthritis and scleroderma.  An allergic reaction to a medicine.  Cancer that affects blood cells. This includes leukemia and lymphoma.  Males and females of all ages and races can develop vasculitis. Some risk factors make vasculitis more likely. These include:  Smoking.  Stress.  Physical injury. SYMPTOMS  There are more than 20 types of vasculitis. Symptoms of each type vary, but some symptoms are common.  Many people with vasculitis:  Have a fever.  Do not feel like eating.  Lose weight.  Feel very tired.  Have  aches and pains.  Feel weak.  Start to not have feeling (numbness) in an area.  Symptoms for some types of vasculitis also could be:  Sores in the mouth or eyes.  Skin problems. This could be sores, spots or rashes.  Trouble seeing.  Trouble breathing.  Blood in the urine.  Headaches.  Pain in the abdomen.  Stuffy or bloody nose. DIAGNOSIS  Vasculitis symptoms are similar to symptoms of many other conditions. That can make it hard to tell if you have vasculitis. To be sure, your caregiver will ask about your symptoms and do a physical exam. Certain tests may be necessary, such as:   A complete blood count (CBC). This test shows how many red blood cells are in your blood. Not having enough red blood cells (anemic) can result from vasculitis.  Erythrocyte sedimentation (also called sed rate test). It measures inflammation in the body.  C reactive protein (CRP). This also shows if there is inflammation.  Anti-neutrophil cytoplasmic antibodies (ANCA). This can tell if the immune system is reacting to certain cells in the blood.  A urine test. This checks for blood or protein in the urine. That could be a sign of kidney damage from vasculitis.  Imaging tests. These tests create pictures from inside the body. Options include:  X-rays.  Computed tomography (CT) scan. This uses X-rays guided by a computer.  Ultrasound. It creates an image using sound waves.  Magnetic resonance imaging (MRI). It uses radio waves, magnets and a computer.  Angiography. A dye is put into your blood vessels. Then, an X-ray is taken of them.  A biopsy of   a blood vessel. This means your caregiver will take out a small piece of a blood vessel. Then, it is checked under a microscope. This is an important test. It often is the best way to know for sure if you have vasculitis. TREATMENT   Treatment will depend on the type of vasculitis and how severe it is. Often, you will need to see a specialist in  immunologic diseases (rheumatologist).  Some types of vasculitis may go away without treatment.  Some types need only over-the-counter drugs.  Prescription medicines are used to treat many types of vasculitis. For example:  Corticosteroids. These are the drugs used most often. They are very powerful. Usually, a high dose is taken until symptoms improve. Then, the dose is gradually decreased. Using corticosteroids for a long time can cause problems. They can make muscles and bones weak. They can cause blood pressure to go up, and cause diabetes. Also, people often gain weight when they take corticosteroids.  Cytotoxic drugs. These kill cells that cause inflammation. Sometimes, they are used if corticosteroids do not help. Other times, both medications are taken.  Surgery. This may be needed to repair a blood vessel that has bulged out (aneurysm).  Treatment can sometimes cure your disease. Other times, it can put the disease in remission (no symptoms). Increased treatment and reevaluation might be necessary if your disease comes back or flares. HOME CARE INSTRUCTIONS   Take any medications that your caregiver prescribes. Follow the directions carefully.  Watch for any problems that can be caused by a drug (side effects). Tell your caregiver right away if you notice any changes or problems.  Keep all appointments for checkups. This is important to help your caregiver watch for side effects. Checkups may include:  Periodic blood tests.  Bone density testing. This checks how strong or weak your bones are.  Blood pressure checks. If your blood pressure rises, you may need to take a drug to control it while you are taking corticosteroids.  Blood sugar checks. This is to be sure you are not developing diabetes. If you have diabetes, corticosteroid medications may make it worse and require increased treatment.  Exercise. First, talk with your caregiver about what would be OK for you to do.  Aerobic exercise (which increases your heart rate) is often suggested. It includes walking. This type of exercise is good because it helps prevent bone loss. It also helps control your blood pressure.  Follow a healthy diet. Include good sources of protein in your diet. Also include fruits, vegetables and whole grains. Your caregiver can refer you to an expert on healthy eating (dietitian) for more detailed advice.  Learn as much as you can about vasculitis. Understanding your condition can help you cope with it. Coping can be hard because this may be something you will have to live with for years.  Consider joining a support group. It often helps to talk about your worries with others who have the same problems.  Tell your caregiver if you feel stressed, anxious or depressed. Your caregiver may refer you to a specialist, or recommend medication to relieve your symptoms. SEEK MEDICAL CARE IF:   The symptoms that led to your diagnosis return.  You develop worsening fever, fatigue, headache, weight loss or pain in your jaw.  You develop signs of infection. Infections can be worse if you are on corticosteroid medication.  You develop any new or unexplained symptoms of disease. SEEK IMMEDIATE MEDICAL CARE IF:   Your eyesight changes.    Pain does not go away, even after taking medication.  You feel pain in your chest or abdomen.  You have trouble breathing.  One side of your face or body becomes suddenly weak or numb.  Your nose bleeds.  There is blood in your urine.  You develop a fever of more than 102 F (38.9 C). Document Released: 01/24/2009 Document Revised: 06/22/2011 Document Reviewed: 01/24/2009 [employer] Patient Information 2014 [employer].  

## 2012-10-03 ENCOUNTER — Encounter: Payer: Self-pay | Admitting: Internal Medicine

## 2012-10-05 ENCOUNTER — Encounter: Payer: Self-pay | Admitting: Internal Medicine

## 2012-10-06 ENCOUNTER — Telehealth: Payer: Self-pay | Admitting: *Deleted

## 2012-10-06 ENCOUNTER — Other Ambulatory Visit (INDEPENDENT_AMBULATORY_CARE_PROVIDER_SITE_OTHER): Payer: 59

## 2012-10-06 ENCOUNTER — Ambulatory Visit (INDEPENDENT_AMBULATORY_CARE_PROVIDER_SITE_OTHER): Payer: 59 | Admitting: Internal Medicine

## 2012-10-06 ENCOUNTER — Encounter: Payer: Self-pay | Admitting: Internal Medicine

## 2012-10-06 VITALS — BP 102/74 | HR 91 | Temp 98.0°F | Wt 278.8 lb

## 2012-10-06 DIAGNOSIS — R5383 Other fatigue: Secondary | ICD-10-CM

## 2012-10-06 DIAGNOSIS — R5381 Other malaise: Secondary | ICD-10-CM

## 2012-10-06 DIAGNOSIS — I959 Hypotension, unspecified: Secondary | ICD-10-CM

## 2012-10-06 LAB — CBC WITH DIFFERENTIAL/PLATELET
Basophils Absolute: 0 10*3/uL (ref 0.0–0.1)
Basophils Relative: 0.2 % (ref 0.0–3.0)
Eosinophils Relative: 3.1 % (ref 0.0–5.0)
HCT: 38.3 % (ref 36.0–46.0)
Hemoglobin: 12.8 g/dL (ref 12.0–15.0)
Lymphocytes Relative: 25.9 % (ref 12.0–46.0)
Lymphs Abs: 2.6 10*3/uL (ref 0.7–4.0)
Monocytes Relative: 7.2 % (ref 3.0–12.0)
Neutro Abs: 6.4 10*3/uL (ref 1.4–7.7)
RBC: 4.67 Mil/uL (ref 3.87–5.11)
RDW: 15.4 % — ABNORMAL HIGH (ref 11.5–14.6)
WBC: 10 10*3/uL (ref 4.5–10.5)

## 2012-10-06 LAB — BASIC METABOLIC PANEL
CO2: 26 mEq/L (ref 19–32)
Calcium: 9.6 mg/dL (ref 8.4–10.5)
Chloride: 95 mEq/L — ABNORMAL LOW (ref 96–112)
Creatinine, Ser: 1.1 mg/dL (ref 0.4–1.2)
Glucose, Bld: 96 mg/dL (ref 70–99)

## 2012-10-06 NOTE — Telephone Encounter (Signed)
Pt seen in clinic today.  

## 2012-10-06 NOTE — Progress Notes (Signed)
Subjective:    Patient ID: [name], female    DOB: 12/28/1965, 47 y.o.   MRN: [id number]  HPI  Pt presents to the clinic today with c/o low blood pressure. This started 1 week ago. The lowest her BP has been is 98/gg. She has felt fatigued and cold. She called Dr. Yetta Barre this am. He has her hold her BP medications. She denies chest pain, chest tightness or shortness of breath. She has never had problems with low blood pressure in the past. She did start the Belviq 2 weeks ago. She has lost 8 lbs in 10 days.  Review of Systems      Past Medical History  Diagnosis Date  . History of colonic polyps   . Headache(784.0)   . Hypertension   . Osteoarthritis   . TIA (transient ischemic attack) 2007    hx of  . Obesity   . Vasculitis     L leg - Polyarthritis  . Herniated disc     Current Outpatient Prescriptions  Medication Sig Dispense Refill  . Lorcaserin HCl (BELVIQ) 10 MG TABS Take 1 tablet by mouth 2 (two) times daily.  30 tablet  0  . Lorcaserin HCl (BELVIQ) 10 MG TABS Take 1 tablet by mouth 2 (two) times daily.  60 tablet  1  . losartan-hydrochlorothiazide (HYZAAR) 100-25 MG per tablet Take 1 tablet by mouth daily.  90 tablet  1  . potassium chloride SA (KLOR-CON M15) 15 MEQ tablet Take 1 tablet (15 mEq total) by mouth 2 (two) times daily.  60 tablet  11  . zolmitriptan (ZOMIG ZMT) 2.5 MG disintegrating tablet Take 1 tablet (2.5 mg total) by mouth as needed for migraine.  4 tablet  0   No current facility-administered medications for this visit.    No Known Allergies  Family History  Problem Relation Age of Onset  . Arthritis Other   . Hypertension Other   . Stroke Other   . Stroke Mother   . Hypertension Mother   . Hypertension Father   . Cancer Father     Prostate    History   Social History  . Marital Status: Single    Spouse Name: N/A    Number of Children: N/A  . Years of Education: N/A   Occupational History  . Not on file.   Social History  Main Topics  . Smoking status: Never Smoker   . Smokeless tobacco: Not on file  . Alcohol Use: No     Comment: occasionally  . Drug Use: No  . Sexually Active: Yes    Birth Control/ Protection: Surgical   Other Topics Concern  . Not on file   Social History Narrative  . No narrative on file     Constitutional: Pt reports fatigue. Denies fever, malaise, headache or abrupt weight changes.  Respiratory: Denies difficulty breathing, shortness of breath, cough or sputum production.   Cardiovascular: Denies chest pain, chest tightness, palpitations or swelling in the hands or feet.  Neurological: Denies dizziness, difficulty with memory, difficulty with speech or problems with balance and coordination.   No other specific complaints in a complete review of systems (except as listed in HPI above).  Objective:   Physical Exam  BP 102/74  Pulse 91  Temp(Src) 98 F (36.7 C) (Oral)  Wt 278 lb 12.8 oz (126.463 kg)  BMI 49.4 kg/m2  SpO2 96% Wt Readings from Last 3 Encounters:  10/06/12 278 lb 12.8 oz (126.463 kg)  09/27/12  284 lb (128.822 kg)  09/23/12 283 lb 8 oz (128.595 kg)    General: Appears her stated age, obese but well developed, well nourished in NAD.  Cardiovascular: Normal rate and rhythm. S1,S2 noted.  No murmur, rubs or gallops noted. No JVD or BLE edema. No carotid bruits noted. Pulmonary/Chest: Normal effort and positive vesicular breath sounds. No respiratory distress. No wheezes, rales or ronchi noted.  Neurological: Alert and oriented. Cranial nerves II-XII intact. Coordination normal. +DTRs bilaterally.   BMET    Component Value Date/Time   NA 139 09/23/2012 0949   K 3.6 09/23/2012 0949   CL 103 09/23/2012 0949   CO2 31 09/23/2012 0949   GLUCOSE 99 09/23/2012 0949   BUN 18 09/23/2012 0949   CREATININE 0.9 09/23/2012 0949   CALCIUM 9.4 09/23/2012 0949   GFRNONAA 88.26 03/20/2009 1506   GFRAA  Value: >60        The eGFR has been calculated using the MDRD equation.  This calculation has not been validated in all clinical 05/25/2007 1446    Lipid Panel     Component Value Date/Time   CHOL 140 09/24/2011 1416   TRIG 80.0 09/24/2011 1416   HDL 49.20 09/24/2011 1416   CHOLHDL 3 09/24/2011 1416   VLDL 16.0 09/24/2011 1416   LDLCALC 75 09/24/2011 1416    CBC    Component Value Date/Time   WBC 7.9 01/20/2012 1113   RBC 4.55 01/20/2012 1113   HGB 12.4 01/20/2012 1113   HCT 37.9 01/20/2012 1113   PLT 274.0 01/20/2012 1113   MCV 83.2 01/20/2012 1113   MCHC 32.8 01/20/2012 1113   RDW 15.6* 01/20/2012 1113   LYMPHSABS 1.8 01/20/2012 1113   MONOABS 0.8 01/20/2012 1113   EOSABS 0.3 01/20/2012 1113   BASOSABS 0.0 01/20/2012 1113    Hgb A1C Lab Results  Component Value Date   HGBA1C 6.0 12/25/2010         Assessment & Plan:   Hypotension, new onset:  Will get CBC, BMET Continue to hold Hyzaar Increase your fluid intake  If worse, to ER immediately

## 2012-10-06 NOTE — Telephone Encounter (Signed)
Pt just saw [name] was sent down to the lab. No orders in comp. Rene Kocher has already left. Check notes in epic she was ordering CBC & BMET. Will enter...lmb

## 2012-10-06 NOTE — Patient Instructions (Signed)
Hypotension  As your heart beats, it forces blood through your arteries. This force is your blood pressure. If your blood pressure is too low for you to go about your normal activities or support the organs of your body, you have hypotension, or low blood pressure. When your blood pressure becomes too low, you may not get enough blood to your brain, and you may feel weak, lightheaded, or develop a more rapid heart rate. In a more severe case, you may faint: this is a sudden, brief loss of consciousness where you pass out and recover completely.  CAUSES   Loss of blood or fluids from the body. This occurs during rapid blood loss. It can also come from dehydration when the body is not taking in enough fluids or is losing fluids faster than they can be replaced. Examples of this would be severe vomiting and diarrhea.   Not taking in enough fluids and salts. This is common in the elderly where thirst mechanisms are not working as well. This means you do not feel thirsty and you do not take in enough water.   Use of blood pressure pills and other medications that may lower the blood pressure below normal.   Over medication (always take your medications as directed).   Irregular heart beat or heart failure when the heart is no longer working well enough to support blood pressure.  Hospitalization is sometimes required for low blood pressure if fluid or blood replacement is needed, if time is needed for medications to wear off, or if further evaluation is needed. Less common causes of low blood pressure might include peripheral or autonomic neuropathy (nerve problems), Parkinson's disease, or other illnesses. Treatment might include a change in diet, change in medications (including medicines aimed at raising your blood pressure), and use of support stockings.  HOME CARE INSTRUCTIONS    Maintain good fluid intake and use a little more salt on your food (if you are not on a restricted diet or having problems with your  heart such as heart failure). This is especially important for the elderly when you may not feel thirsty in the winter.   Take your medications as directed.   Get up slowly from reclining or sitting positions. This gives your blood pressure a chance to adjust. As we grow older our ability to regulate our blood pressure may not be as good as when we were younger.   Wear support stockings if prescribed.   Use walkers, canes, etc., if advised.   Talk with your physician or nurse about a Home Safety Evaluation (usually done by visiting nurses).  SEEK IMMEDIATE MEDICAL CARE IF:    You have a fainting episode. Do not drive yourself. Call 911 if no other help is available.   You have chest pain, nausea (feeling sick to your stomach) or vomiting.   You have a loss of feeling in some part of your body, or lose movement in your arms or legs.   You have difficulty with speech.   You become sweaty and/or feel light headed.  Make sure you are re-checked as instructed.  MAKE SURE YOU:    Understand these instructions.   Will watch your condition.   Will get help right away if you are not doing well or get worse.  Document Released: 03/30/2005 Document Revised: 06/22/2011 Document Reviewed: 11/18/2007  [employer] Patient Information 2014 [employer].

## 2012-10-06 NOTE — Telephone Encounter (Signed)
she seen here today

## 2012-10-06 NOTE — Telephone Encounter (Signed)
Pt called states her BP was 98/66.  Please advise

## 2012-10-10 ENCOUNTER — Encounter: Payer: Self-pay | Admitting: Internal Medicine

## 2012-10-10 ENCOUNTER — Ambulatory Visit (INDEPENDENT_AMBULATORY_CARE_PROVIDER_SITE_OTHER): Payer: 59 | Admitting: Internal Medicine

## 2012-10-10 VITALS — BP 110/78 | HR 64 | Temp 98.4°F | Resp 16 | Ht 61.0 in | Wt 281.0 lb

## 2012-10-10 DIAGNOSIS — Z1231 Encounter for screening mammogram for malignant neoplasm of breast: Secondary | ICD-10-CM

## 2012-10-10 DIAGNOSIS — I10 Essential (primary) hypertension: Secondary | ICD-10-CM

## 2012-10-10 DIAGNOSIS — E876 Hypokalemia: Secondary | ICD-10-CM

## 2012-10-10 NOTE — Progress Notes (Signed)
  Subjective:    Patient ID: [name], female    DOB: Dec 05, 1965, 47 y.o.   MRN: [id number]  Hypertension This is a chronic problem. The current episode started more than 1 year ago. The problem has been resolved since onset. The problem is controlled. Pertinent negatives include no anxiety, blurred vision, chest pain, headaches, malaise/fatigue, neck pain, orthopnea, palpitations, peripheral edema, PND, shortness of breath or sweats. Past treatments include angiotensin blockers and diuretics. The current treatment provides significant improvement. There are no compliance problems.       Review of Systems  Constitutional: Negative.  Negative for fever, chills, malaise/fatigue, diaphoresis, activity change, appetite change, fatigue and unexpected weight change.  HENT: Negative.  Negative for neck pain.   Eyes: Negative.  Negative for blurred vision.  Respiratory: Negative.  Negative for cough, chest tightness, shortness of breath, wheezing and stridor.   Cardiovascular: Negative.  Negative for chest pain, palpitations, orthopnea and PND.  Gastrointestinal: Negative.  Negative for nausea, vomiting, abdominal pain, diarrhea and constipation.  Endocrine: Negative.  Negative for polydipsia, polyphagia and polyuria.  Genitourinary: Negative.   Musculoskeletal: Negative.   Skin: Negative.   Allergic/Immunologic: Negative.   Neurological: Negative.  Negative for dizziness, tremors, weakness, light-headedness, numbness and headaches.  Hematological: Negative.  Negative for adenopathy. Does not bruise/bleed easily.  Psychiatric/Behavioral: Negative.        Objective:   Physical Exam  Vitals reviewed. Constitutional: She is oriented to person, place, and time. She appears well-developed and well-nourished. No distress.  HENT:  Head: Normocephalic and atraumatic.  Mouth/Throat: Oropharynx is clear and moist. No oropharyngeal exudate.  Eyes: Conjunctivae are normal. Right eye exhibits  no discharge. Left eye exhibits no discharge. No scleral icterus.  Neck: Normal range of motion. Neck supple. No JVD present. No tracheal deviation present. No thyromegaly present.  Cardiovascular: Normal rate, regular rhythm, normal heart sounds and intact distal pulses.  Exam reveals no gallop and no friction rub.   No murmur heard. Pulmonary/Chest: Effort normal and breath sounds normal. No stridor. No respiratory distress. She has no wheezes. She has no rales. She exhibits no tenderness.  Abdominal: Soft. Bowel sounds are normal. She exhibits no distension and no mass. There is no tenderness. There is no rebound and no guarding.  Musculoskeletal: Normal range of motion. She exhibits no edema and no tenderness.  Lymphadenopathy:    She has no cervical adenopathy.  Neurological: She is oriented to person, place, and time.  Skin: Skin is warm and dry. No rash noted. She is not diaphoretic. No erythema. No pallor.  Psychiatric: She has a normal mood and affect. Her behavior is normal. Judgment and thought content normal.     Lab Results  Component Value Date   WBC 10.0 10/06/2012   HGB 12.8 10/06/2012   HCT 38.3 10/06/2012   PLT 299.0 10/06/2012   GLUCOSE 96 10/06/2012   CHOL 140 09/24/2011   TRIG 80.0 09/24/2011   HDL 49.20 09/24/2011   LDLCALC 75 09/24/2011   ALT 17 09/24/2011   AST 21 09/24/2011   NA 132* 10/06/2012   K 3.7 10/06/2012   CL 95* 10/06/2012   CREATININE 1.1 10/06/2012   BUN 19 10/06/2012   CO2 26 10/06/2012   TSH 1.77 09/24/2011   INR 1.0 05/25/2007   HGBA1C 6.0 12/25/2010        Assessment & Plan:

## 2012-10-10 NOTE — Assessment & Plan Note (Signed)
Improvement noted 

## 2012-10-10 NOTE — Assessment & Plan Note (Signed)
She was feeling poorly last week and was found to be hypotensive and Na+ and Cl- were low The HCTZ was stopped and she feels better now and her BP is normal I have asked her to stay off the BP meds for now Will monitor her BP and advise further

## 2012-10-10 NOTE — Patient Instructions (Signed)

## 2012-11-10 ENCOUNTER — Encounter: Payer: Self-pay | Admitting: Internal Medicine

## 2012-11-10 ENCOUNTER — Other Ambulatory Visit: Payer: Self-pay | Admitting: Internal Medicine

## 2012-11-11 ENCOUNTER — Encounter: Payer: Self-pay | Admitting: Internal Medicine

## 2012-11-25 ENCOUNTER — Ambulatory Visit: Payer: 59 | Admitting: Internal Medicine

## 2012-12-01 ENCOUNTER — Ambulatory Visit (HOSPITAL_COMMUNITY)
Admission: RE | Admit: 2012-12-01 | Discharge: 2012-12-01 | Disposition: A | Discharge status: Home / Self Care | Payer: 59 | Source: Ambulatory Visit | Attending: Internal Medicine | Admitting: Internal Medicine

## 2012-12-01 DIAGNOSIS — Z1231 Encounter for screening mammogram for malignant neoplasm of breast: Secondary | ICD-10-CM | POA: Insufficient documentation

## 2012-12-27 ENCOUNTER — Encounter (HOSPITAL_BASED_OUTPATIENT_CLINIC_OR_DEPARTMENT_OTHER): Payer: Self-pay | Admitting: *Deleted

## 2012-12-27 ENCOUNTER — Emergency Department (HOSPITAL_BASED_OUTPATIENT_CLINIC_OR_DEPARTMENT_OTHER)
Admission: EM | Admit: 2012-12-27 | Discharge: 2012-12-27 | Disposition: A | Discharge status: Home / Self Care | Payer: 59 | Attending: Emergency Medicine | Admitting: Emergency Medicine

## 2012-12-27 DIAGNOSIS — E669 Obesity, unspecified: Secondary | ICD-10-CM | POA: Insufficient documentation

## 2012-12-27 DIAGNOSIS — Z8673 Personal history of transient ischemic attack (TIA), and cerebral infarction without residual deficits: Secondary | ICD-10-CM | POA: Insufficient documentation

## 2012-12-27 DIAGNOSIS — S90569A Insect bite (nonvenomous), unspecified ankle, initial encounter: Secondary | ICD-10-CM | POA: Insufficient documentation

## 2012-12-27 DIAGNOSIS — Z79899 Other long term (current) drug therapy: Secondary | ICD-10-CM | POA: Insufficient documentation

## 2012-12-27 DIAGNOSIS — Y929 Unspecified place or not applicable: Secondary | ICD-10-CM | POA: Insufficient documentation

## 2012-12-27 DIAGNOSIS — Z860100 Personal history of colon polyps, unspecified: Secondary | ICD-10-CM | POA: Insufficient documentation

## 2012-12-27 DIAGNOSIS — Z8739 Personal history of other diseases of the musculoskeletal system and connective tissue: Secondary | ICD-10-CM | POA: Insufficient documentation

## 2012-12-27 DIAGNOSIS — W57XXXA Bitten or stung by nonvenomous insect and other nonvenomous arthropods, initial encounter: Secondary | ICD-10-CM

## 2012-12-27 DIAGNOSIS — I10 Essential (primary) hypertension: Secondary | ICD-10-CM | POA: Insufficient documentation

## 2012-12-27 DIAGNOSIS — Y939 Activity, unspecified: Secondary | ICD-10-CM | POA: Insufficient documentation

## 2012-12-27 NOTE — ED Notes (Signed)
Insect bite to her right lower leg 2 weeks ago. It is not healing.

## 2012-12-27 NOTE — ED Provider Notes (Signed)
CSN: [id number]     Arrival date & time 12/27/12  2129 History   First MD Initiated Contact with Patient 12/27/12 2232     Chief Complaint  Patient presents with  . Insect Bite   (Consider location/radiation/quality/duration/timing/severity/associated sxs/prior Treatment) Patient is a 47 y.o. female presenting with leg pain. The history is provided by the patient. No language interpreter was used.  Leg Pain Location:  Leg Leg location:  R lower leg Pain details:    Severity:  Mild Associated symptoms: no fever   Associated symptoms comment:  She felt a sting on her right lower leg 2 weeks ago and she presents with concern for continued ulceration and tenderness at the site. No redness, fever, spreading pain or drainage. She did not see what bit or stung her.    Past Medical History  Diagnosis Date  . History of colonic polyps   . Headache(784.0)   . Hypertension   . Osteoarthritis   . TIA (transient ischemic attack) 2007    hx of  . Obesity   . Vasculitis     L leg - Polyarthritis  . Herniated disc    Past Surgical History  Procedure Laterality Date  . Abdominal hysterectomy     Family History  Problem Relation Age of Onset  . Arthritis Other   . Hypertension Other   . Stroke Other   . Stroke Mother   . Hypertension Mother   . Hypertension Father   . Cancer Father     Prostate   History  Substance Use Topics  . Smoking status: Never Smoker   . Smokeless tobacco: Never Used  . Alcohol Use: No     Comment: occasionally   OB History   Grav Para Term Preterm Abortions TAB SAB Ect Mult Living                 Review of Systems  Constitutional: Negative for fever.  Musculoskeletal:       See HPI.  Skin:       See HPI.    Allergies  Review of patient's allergies indicates no known allergies.  Home Medications   Current Outpatient Rx  Name  Route  Sig  Dispense  Refill  . losartan-hydrochlorothiazide (HYZAAR) 100-25 MG per tablet   Oral   Take 1  tablet by mouth daily.         . Lorcaserin HCl (BELVIQ) 10 MG TABS   Oral   Take 1 tablet by mouth 2 (two) times daily.   30 tablet   0   . potassium chloride SA (KLOR-CON M15) 15 MEQ tablet   Oral   Take 1 tablet (15 mEq total) by mouth 2 (two) times daily.   60 tablet   11    BP 131/79  Pulse 73  Temp(Src) 98.9 F (37.2 C) (Oral)  Resp 18  Ht 5\' 2"  (1.575 m)  Wt 283 lb (128.368 kg)  BMI 51.75 kg/m2  SpO2 100% Physical Exam  Constitutional: She is oriented to person, place, and time. She appears well-developed and well-nourished. No distress.  Pulmonary/Chest: Effort normal.  Musculoskeletal:  No calf tenderness or swelling.   Neurological: She is alert and oriented to person, place, and time.  Skin: Skin is warm and dry.  Small Nodular ulcerated lesion to posterior right lower leg without drainage, redness or warmth.     ED Course  Procedures (including critical care time) Labs Review Labs Reviewed - No data to display Imaging Review  No results found.  MDM  No diagnosis found. 1. Insect bite  Uncomplicated insect bite.    Arnoldo Hooker, PA-C 12/27/12 2254

## 2012-12-28 NOTE — ED Provider Notes (Signed)
Medical screening examination/treatment/procedure(s) were performed by non-physician practitioner and as supervising physician I was immediately available for consultation/collaboration.   Megan E Docherty, MD 12/28/12 1322 

## 2012-12-30 ENCOUNTER — Ambulatory Visit (INDEPENDENT_AMBULATORY_CARE_PROVIDER_SITE_OTHER): Payer: 59 | Admitting: Internal Medicine

## 2012-12-30 ENCOUNTER — Encounter: Payer: Self-pay | Admitting: Internal Medicine

## 2012-12-30 VITALS — BP 110/70 | HR 82 | Temp 97.8°F | Resp 16 | Wt 285.0 lb

## 2012-12-30 DIAGNOSIS — G5600 Carpal tunnel syndrome, unspecified upper limb: Secondary | ICD-10-CM | POA: Insufficient documentation

## 2012-12-30 DIAGNOSIS — Z23 Encounter for immunization: Secondary | ICD-10-CM

## 2012-12-30 DIAGNOSIS — L259 Unspecified contact dermatitis, unspecified cause: Secondary | ICD-10-CM

## 2012-12-30 DIAGNOSIS — L309 Dermatitis, unspecified: Secondary | ICD-10-CM

## 2012-12-30 MED ORDER — TRIAMCINOLONE ACETONIDE 0.5 % EX CREA: TOPICAL_CREAM | Freq: Three times a day (TID) | CUTANEOUS | Status: DC

## 2012-12-30 NOTE — Patient Instructions (Addendum)
Eczema Atopic dermatitis, or eczema, is an inherited type of sensitive skin. Often people with eczema have a family history of allergies, asthma, or hay fever. It causes a red itchy rash and dry scaly skin. The itchiness may occur before the skin rash and may be very intense. It is not contagious. Eczema is generally worse during the cooler winter months and often improves with the warmth of summer. Eczema usually starts showing signs in infancy. Some children outgrow eczema, but it may last through adulthood. Flare-ups may be caused by:  Eating something or contact with something you are sensitive or allergic to.  Stress. DIAGNOSIS  The diagnosis of eczema is usually based upon symptoms and medical history. TREATMENT  Eczema cannot be cured, but symptoms usually can be controlled with treatment or avoidance of allergens (things to which you are sensitive or allergic to).  Controlling the itching and scratching.  Use over-the-counter antihistamines as directed for itching. It is especially useful at night when the itching tends to be worse.  Use over-the-counter steroid creams as directed for itching.  Scratching makes the rash and itching worse and may cause impetigo (a skin infection) if fingernails are contaminated (dirty).  Keeping the skin well moisturized with creams every day. This will seal in moisture and help prevent dryness. Lotions containing alcohol and water can dry the skin and are not recommended.  Limiting exposure to allergens.  Recognizing situations that cause stress.  Developing a plan to manage stress. HOME CARE INSTRUCTIONS   Take prescription and over-the-counter medicines as directed by your caregiver.  Do not use anything on the skin without checking with your caregiver.  Keep baths or showers short (5 minutes) in warm (not hot) water. Use mild cleansers for bathing. You may add non-perfumed bath oil to the bath water. It is best to avoid soap and bubble  bath.  Immediately after a bath or shower, when the skin is still damp, apply a moisturizing ointment to the entire body. This ointment should be a petroleum ointment. This will seal in moisture and help prevent dryness. The thicker the ointment the better. These should be unscented.  Keep fingernails cut short and wash hands often. If your child has eczema, it may be necessary to put soft gloves or mittens on your child at night.  Dress in clothes made of cotton or cotton blends. Dress lightly, as heat increases itching.  Avoid foods that may cause flare-ups. Common foods include cow's milk, peanut butter, eggs and wheat.  Keep a child with eczema away from anyone with fever blisters. The virus that causes fever blisters (herpes simplex) can cause a serious skin infection in children with eczema. SEEK MEDICAL CARE IF:   Itching interferes with sleep.  The rash gets worse or is not better within one week following treatment.  The rash looks infected (pus or soft yellow scabs).  You or your child has an oral temperature above 102 F (38.9 C).  Your baby is older than 3 months with a rectal temperature of 100.5 F (38.1 C) or higher for more than 1 day.  The rash flares up after contact with someone who has fever blisters. SEEK IMMEDIATE MEDICAL CARE IF:   Your baby is older than 3 months with a rectal temperature of 102 F (38.9 C) or higher.  Your baby is older than 3 months or younger with a rectal temperature of 100.4 F (38 C) or higher. Document Released: 03/27/2000 Document Revised: 06/22/2011 Document Reviewed: 01/30/2009 [employer]   Patient Information 2014 [place], [place]. Carpal Tunnel Syndrome The carpal tunnel is a narrow area located on the palm side of your wrist. The tunnel is formed by the wrist bones and ligaments. Nerves, blood vessels, and tendons pass through the carpal tunnel. Repeated wrist motion or certain diseases may cause swelling within the tunnel. This  swelling pinches the main nerve in the wrist (median nerve) and causes the painful hand and arm condition called carpal tunnel syndrome. CAUSES   Repeated wrist motions.  Wrist injuries.  Certain diseases like arthritis, diabetes, alcoholism, hyperthyroidism, and kidney failure.  Obesity.  Pregnancy. SYMPTOMS   A "pins and needles" feeling in your fingers or hand.  Tingling or numbness in your fingers or hand.  An aching feeling in your entire arm.  Wrist pain that goes up your arm to your shoulder.  Pain that goes down into your palm or fingers.  A weak feeling in your hands. DIAGNOSIS  Your caregiver will take your history and perform a physical exam. An electromyography test may be needed. This test measures electrical signals sent out by the muscles. The electrical signals are usually slowed by carpal tunnel syndrome. You may also need X-rays. TREATMENT  Carpal tunnel syndrome may clear up by itself. Your caregiver may recommend a wrist splint or medicine such as a nonsteroidal anti-inflammatory medicine. Cortisone injections may help. Sometimes, surgery may be needed to free the pinched nerve.  HOME CARE INSTRUCTIONS   Take all medicine as directed by your caregiver. Only take over-the-counter or prescription medicines for pain, discomfort, or fever as directed by your caregiver.  If you were given a splint to keep your wrist from bending, wear it as directed. It is important to wear the splint at night. Wear the splint for as long as you have pain or numbness in your hand, arm, or wrist. This may take 1 to 2 months.  Rest your wrist from any activity that may be causing your pain. If your symptoms are work-related, you may need to talk to your employer about changing to a job that does not require using your wrist.  Put ice on your wrist after long periods of wrist activity.  Put ice in a plastic bag.  Place a towel between your skin and the bag.  Leave the ice on for  15-20 minutes, 3-4 times a day.  Keep all follow-up visits as directed by your caregiver. This includes any orthopedic referrals, physical therapy, and rehabilitation. Any delay in getting necessary care could result in a delay or failure of your condition to heal. SEEK IMMEDIATE MEDICAL CARE IF:   You have new, unexplained symptoms.  Your symptoms get worse and are not helped or controlled with medicines. MAKE SURE YOU:   Understand these instructions.  Will watch your condition.  Will get help right away if you are not doing well or get worse. Document Released: 03/27/2000 Document Revised: 06/22/2011 Document Reviewed: 02/13/2011 [facility] Patient Information 2014 [place], [place].

## 2012-12-30 NOTE — Progress Notes (Signed)
Subjective:    Patient ID: [name], female    DOB: 04-Jul-1965, 47 y.o.   MRN: [id number]  Arthritis Presents for initial visit. The disease course has been worsening. The condition has lasted for 2 weeks. She complains of pain. She reports no stiffness, joint swelling or joint warmth. Affected locations include the left wrist and right wrist. Her pain is at a severity of 1/10. Associated symptoms include pain at night, pain while resting and rash (one itchy spot on the right calf). Pertinent negatives include no diarrhea, dry eyes, dry mouth, dysuria, fatigue, fever, Raynaud's syndrome, uveitis or weight loss. There is no history of chronic back pain, lupus, osteoarthritis, psoriasis or rheumatoid arthritis.  Her pertinent risk factors include overuse. Past treatments include nothing. Factors aggravating her arthritis include activity and gripping.      Review of Systems  Constitutional: Negative.  Negative for fever, chills, weight loss, diaphoresis, appetite change and fatigue.  HENT: Negative.  Negative for sore throat, facial swelling, trouble swallowing and voice change.   Eyes: Negative.   Respiratory: Negative.  Negative for cough, choking, chest tightness, shortness of breath, wheezing and stridor.   Cardiovascular: Negative.  Negative for chest pain, palpitations and leg swelling.  Gastrointestinal: Negative.  Negative for nausea, vomiting, abdominal pain, diarrhea, constipation, blood in stool and anal bleeding.  Endocrine: Negative.   Genitourinary: Negative.  Negative for dysuria.  Musculoskeletal: Positive for arthralgias and arthritis. Negative for myalgias, back pain, joint swelling, gait problem and stiffness.  Skin: Positive for rash (one itchy spot on the right calf). Negative for color change, pallor and wound.  Allergic/Immunologic: Negative.   Neurological: Negative.   Hematological: Negative.  Negative for adenopathy. Does not bruise/bleed easily.    Psychiatric/Behavioral: Negative.        Objective:   Physical Exam  Vitals reviewed. Constitutional: She is oriented to person, place, and time. She appears well-developed and well-nourished. No distress.  HENT:  Head: Normocephalic and atraumatic.  Mouth/Throat: Oropharynx is clear and moist. No oropharyngeal exudate.  Eyes: Conjunctivae are normal. Right eye exhibits no discharge. Left eye exhibits no discharge. No scleral icterus.  Neck: Normal range of motion. Neck supple. No JVD present. No tracheal deviation present. No thyromegaly present.  Cardiovascular: Normal rate, normal heart sounds and intact distal pulses.  Exam reveals no gallop.   No murmur heard. Pulmonary/Chest: Effort normal and breath sounds normal. No stridor. No respiratory distress. She has no wheezes. She has no rales. She exhibits no tenderness.  Abdominal: Soft. Bowel sounds are normal. She exhibits no distension and no mass. There is no tenderness. There is no rebound and no guarding.  Musculoskeletal: Normal range of motion. She exhibits no edema and no tenderness.       Right wrist: Normal. She exhibits normal range of motion, no tenderness, no bony tenderness, no swelling, no effusion, no crepitus, no deformity and no laceration.       Left wrist: Normal. She exhibits normal range of motion, no tenderness, no bony tenderness, no swelling, no effusion, no crepitus, no deformity and no laceration.  +tinel's and phalen's tests on BUE  Lymphadenopathy:    She has no cervical adenopathy.  Neurological: She is oriented to person, place, and time.  Skin: Skin is warm and dry. No rash noted. She is not diaphoretic. No erythema. No pallor.     Psychiatric: She has a normal mood and affect. Her behavior is normal. Judgment and thought content normal.  Lab Results  Component Value Date   WBC 10.0 10/06/2012   HGB 12.8 10/06/2012   HCT 38.3 10/06/2012   PLT 299.0 10/06/2012   GLUCOSE 96 10/06/2012   CHOL  140 09/24/2011   TRIG 80.0 09/24/2011   HDL 49.20 09/24/2011   LDLCALC 75 09/24/2011   ALT 17 09/24/2011   AST 21 09/24/2011   NA 132* 10/06/2012   K 3.7 10/06/2012   CL 95* 10/06/2012   CREATININE 1.1 10/06/2012   BUN 19 10/06/2012   CO2 26 10/06/2012   TSH 1.77 09/24/2011   INR 1.0 05/25/2007   HGBA1C 6.0 12/25/2010      Assessment & Plan:

## 2013-01-02 ENCOUNTER — Encounter: Payer: Self-pay | Admitting: Internal Medicine

## 2013-01-03 NOTE — Assessment & Plan Note (Signed)
I think this is what is causing her pain I have ordered a NCS/EMG to check the severity

## 2013-01-03 NOTE — Assessment & Plan Note (Signed)
Will treat this with TAC cream

## 2013-01-27 ENCOUNTER — Ambulatory Visit: Payer: 59 | Admitting: Internal Medicine

## 2013-01-27 DIAGNOSIS — Z0289 Encounter for other administrative examinations: Secondary | ICD-10-CM

## 2013-02-24 ENCOUNTER — Encounter: Payer: Self-pay | Admitting: Internal Medicine

## 2013-03-01 ENCOUNTER — Ambulatory Visit (INDEPENDENT_AMBULATORY_CARE_PROVIDER_SITE_OTHER): Payer: 59 | Admitting: Internal Medicine

## 2013-03-01 ENCOUNTER — Encounter: Payer: Self-pay | Admitting: Internal Medicine

## 2013-03-01 ENCOUNTER — Other Ambulatory Visit (INDEPENDENT_AMBULATORY_CARE_PROVIDER_SITE_OTHER): Payer: 59

## 2013-03-01 VITALS — BP 110/78 | HR 79 | Temp 98.1°F | Resp 16 | Ht 63.0 in | Wt 292.0 lb

## 2013-03-01 DIAGNOSIS — M300 Polyarteritis nodosa: Secondary | ICD-10-CM

## 2013-03-01 DIAGNOSIS — I10 Essential (primary) hypertension: Secondary | ICD-10-CM

## 2013-03-01 DIAGNOSIS — I776 Arteritis, unspecified: Secondary | ICD-10-CM

## 2013-03-01 DIAGNOSIS — E876 Hypokalemia: Secondary | ICD-10-CM

## 2013-03-01 LAB — CBC WITH DIFFERENTIAL/PLATELET
Basophils Absolute: 0 10*3/uL (ref 0.0–0.1)
Eosinophils Relative: 3 % (ref 0.0–5.0)
Hemoglobin: 11.8 g/dL — ABNORMAL LOW (ref 12.0–15.0)
Lymphocytes Relative: 34.7 % (ref 12.0–46.0)
Monocytes Relative: 4.9 % (ref 3.0–12.0)
Platelets: 299 10*3/uL (ref 150.0–400.0)
RDW: 15.3 % — ABNORMAL HIGH (ref 11.5–14.6)
WBC: 8.6 10*3/uL (ref 4.5–10.5)

## 2013-03-01 LAB — COMPREHENSIVE METABOLIC PANEL
ALT: 21 U/L (ref 0–35)
CO2: 26 mEq/L (ref 19–32)
Calcium: 9.4 mg/dL (ref 8.4–10.5)
Chloride: 106 mEq/L (ref 96–112)
Creatinine, Ser: 0.9 mg/dL (ref 0.4–1.2)
GFR: 84.5 mL/min (ref >60.00)
Glucose, Bld: 90 mg/dL (ref 70–99)
Total Protein: 7.3 g/dL (ref 6.0–8.3)

## 2013-03-01 LAB — CK: Total CK: 106 U/L (ref 7–177)

## 2013-03-01 MED ORDER — METHYLPREDNISOLONE ACETATE 80 MG/ML IJ SUSP
120.0000 mg | Freq: Once | INTRAMUSCULAR | Status: AC
  Administered 2013-03-01: 120 mg via INTRAMUSCULAR

## 2013-03-01 NOTE — Progress Notes (Signed)
Pre visit review using our clinic review tool, if applicable. No additional management support is needed unless otherwise documented below in the visit note. 

## 2013-03-01 NOTE — Patient Instructions (Signed)

## 2013-03-01 NOTE — Progress Notes (Signed)
  Subjective:    Patient ID: [name], female    DOB: 08-08-1965, 47 y.o.   MRN: [id number]  HPI  She returns c/o a recurrence of pain/swelling/discoloration over her anterior LLE for several weeks, she had similar symptoms years ago when she was diagnosed and treated for PAN.  Review of Systems  Constitutional: Negative.  Negative for fever, chills, diaphoresis, activity change, appetite change, fatigue and unexpected weight change.  HENT: Negative.   Eyes: Negative.   Respiratory: Negative.  Negative for cough, chest tightness, shortness of breath, wheezing and stridor.   Cardiovascular: Positive for leg swelling. Negative for chest pain and palpitations.  Gastrointestinal: Negative.  Negative for nausea, vomiting, abdominal pain, diarrhea, constipation, blood in stool and rectal pain.  Endocrine: Negative.   Genitourinary: Negative.   Musculoskeletal: Positive for arthralgias and myalgias. Negative for back pain, gait problem, joint swelling, neck pain and neck stiffness.  Skin: Negative.   Allergic/Immunologic: Negative.   Neurological: Negative.   Hematological: Negative.  Negative for adenopathy. Does not bruise/bleed easily.  Psychiatric/Behavioral: Negative.        Objective:   Physical Exam  Vitals reviewed. Constitutional: She is oriented to person, place, and time. She appears well-developed and well-nourished. No distress.  HENT:  Head: Normocephalic and atraumatic.  Mouth/Throat: Oropharynx is clear and moist. No oropharyngeal exudate.  Eyes: Conjunctivae are normal. Right eye exhibits no discharge. Left eye exhibits no discharge. No scleral icterus.  Neck: Normal range of motion. Neck supple. No JVD present. No tracheal deviation present. No thyromegaly present.  Cardiovascular: Normal rate, regular rhythm, normal heart sounds and intact distal pulses.  Exam reveals no gallop and no friction rub.   No murmur heard. Pulmonary/Chest: Effort normal and breath  sounds normal. No stridor. No respiratory distress. She has no wheezes. She has no rales. She exhibits no tenderness.  Abdominal: Soft. Bowel sounds are normal. She exhibits no distension and no mass. There is no tenderness. There is no rebound and no guarding.  Musculoskeletal: Normal range of motion.       Legs: Lymphadenopathy:    She has no cervical adenopathy.  Neurological: She is oriented to person, place, and time.  Skin: Skin is warm and dry. No rash noted. She is not diaphoretic. No erythema. No pallor.  Psychiatric: She has a normal mood and affect. Her behavior is normal. Judgment and thought content normal.     Lab Results  Component Value Date   WBC 10.0 10/06/2012   HGB 12.8 10/06/2012   HCT 38.3 10/06/2012   PLT 299.0 10/06/2012   GLUCOSE 96 10/06/2012   CHOL 140 09/24/2011   TRIG 80.0 09/24/2011   HDL 49.20 09/24/2011   LDLCALC 75 09/24/2011   ALT 17 09/24/2011   AST 21 09/24/2011   NA 132* 10/06/2012   K 3.7 10/06/2012   CL 95* 10/06/2012   CREATININE 1.1 10/06/2012   BUN 19 10/06/2012   CO2 26 10/06/2012   TSH 1.77 09/24/2011   INR 1.0 05/25/2007   HGBA1C 6.0 12/25/2010       Assessment & Plan:

## 2013-03-02 LAB — SEDIMENTATION RATE: Sed Rate: 54 mm/hr — ABNORMAL HIGH (ref 0–22)

## 2013-03-05 ENCOUNTER — Encounter: Payer: Self-pay | Admitting: Internal Medicine

## 2013-03-05 NOTE — Assessment & Plan Note (Signed)
She appears to be having a flare up so I gave her an injection of steroids I will check her labs to look for myopathy, vasculitis, etc.

## 2013-03-05 NOTE — Assessment & Plan Note (Signed)
Will treat her with steroids and will check an ESR

## 2013-03-05 NOTE — Assessment & Plan Note (Signed)
Her BP is well controlled Her lytes and renal function are normal 

## 2013-03-08 ENCOUNTER — Encounter: Payer: Self-pay | Admitting: Internal Medicine

## 2013-04-18 ENCOUNTER — Encounter: Payer: Self-pay | Admitting: Internal Medicine

## 2013-04-24 ENCOUNTER — Other Ambulatory Visit (INDEPENDENT_AMBULATORY_CARE_PROVIDER_SITE_OTHER): Payer: 59

## 2013-04-24 ENCOUNTER — Encounter: Payer: Self-pay | Admitting: Internal Medicine

## 2013-04-24 ENCOUNTER — Ambulatory Visit (INDEPENDENT_AMBULATORY_CARE_PROVIDER_SITE_OTHER): Payer: 59 | Admitting: Internal Medicine

## 2013-04-24 VITALS — BP 116/74 | HR 104 | Temp 98.8°F | Resp 16 | Ht 63.0 in | Wt 285.0 lb

## 2013-04-24 DIAGNOSIS — R5381 Other malaise: Secondary | ICD-10-CM

## 2013-04-24 DIAGNOSIS — R5383 Other fatigue: Principal | ICD-10-CM

## 2013-04-24 DIAGNOSIS — R232 Flushing: Secondary | ICD-10-CM | POA: Insufficient documentation

## 2013-04-24 DIAGNOSIS — R748 Abnormal levels of other serum enzymes: Secondary | ICD-10-CM

## 2013-04-24 DIAGNOSIS — I10 Essential (primary) hypertension: Secondary | ICD-10-CM

## 2013-04-24 DIAGNOSIS — R079 Chest pain, unspecified: Secondary | ICD-10-CM

## 2013-04-24 DIAGNOSIS — N951 Menopausal and female climacteric states: Secondary | ICD-10-CM

## 2013-04-24 LAB — TROPONIN I: Troponin I: 0.01 ng/mL (ref <0.06)

## 2013-04-24 LAB — CBC WITH DIFFERENTIAL/PLATELET
Basophils Absolute: 0 10*3/uL (ref 0.0–0.1)
Basophils Relative: 0.4 % (ref 0.0–3.0)
EOS ABS: 0.2 10*3/uL (ref 0.0–0.7)
Eosinophils Relative: 2 % (ref 0.0–5.0)
HCT: 38.1 % (ref 36.0–46.0)
Hemoglobin: 12.9 g/dL (ref 12.0–15.0)
LYMPHS PCT: 25.2 % (ref 12.0–46.0)
Lymphs Abs: 2.9 10*3/uL (ref 0.7–4.0)
MCHC: 34 g/dL (ref 30.0–36.0)
MCV: 80.6 fl (ref 78.0–100.0)
MONOS PCT: 6.2 % (ref 3.0–12.0)
Monocytes Absolute: 0.7 10*3/uL (ref 0.1–1.0)
NEUTROS PCT: 66.2 % (ref 43.0–77.0)
Neutro Abs: 7.6 10*3/uL (ref 1.4–7.7)
PLATELETS: 325 10*3/uL (ref 150.0–400.0)
RBC: 4.72 Mil/uL (ref 3.87–5.11)
RDW: 15.8 % — AB (ref 11.5–14.6)
WBC: 11.5 10*3/uL — ABNORMAL HIGH (ref 4.5–10.5)

## 2013-04-24 NOTE — Patient Instructions (Signed)
Chest Pain (Nonspecific) °It is often hard to give a specific diagnosis for the cause of chest pain. There is always a chance that your pain could be related to something serious, such as a heart attack or a blood clot in the lungs. You need to follow up with your caregiver for further evaluation. °CAUSES  °· Heartburn. °· Pneumonia or bronchitis. °· Anxiety or stress. °· Inflammation around your heart (pericarditis) or lung (pleuritis or pleurisy). °· A blood clot in the lung. °· A collapsed lung (pneumothorax). It can develop suddenly on its own (spontaneous pneumothorax) or from injury (trauma) to the chest. °· Shingles infection (herpes zoster virus). °The chest wall is composed of bones, muscles, and cartilage. Any of these can be the source of the pain. °· The bones can be bruised by injury. °· The muscles or cartilage can be strained by coughing or overwork. °· The cartilage can be affected by inflammation and become sore (costochondritis). °DIAGNOSIS  °Lab tests or other studies, such as X-rays, electrocardiography, stress testing, or cardiac imaging, may be needed to find the cause of your pain.  °TREATMENT  °· Treatment depends on what may be causing your chest pain. Treatment may include: °· Acid blockers for heartburn. °· Anti-inflammatory medicine. °· Pain medicine for inflammatory conditions. °· Antibiotics if an infection is present. °· You may be advised to change lifestyle habits. This includes stopping smoking and avoiding alcohol, caffeine, and chocolate. °· You may be advised to keep your head raised (elevated) when sleeping. This reduces the chance of acid going backward from your stomach into your esophagus. °· Most of the time, nonspecific chest pain will improve within 2 to 3 days with rest and mild pain medicine. °HOME CARE INSTRUCTIONS  °· If antibiotics were prescribed, take your antibiotics as directed. Finish them even if you start to feel better. °· For the next few days, avoid physical  activities that bring on chest pain. Continue physical activities as directed. °· Do not smoke. °· Avoid drinking alcohol. °· Only take over-the-counter or prescription medicine for pain, discomfort, or fever as directed by your caregiver. °· Follow your caregiver's suggestions for further testing if your chest pain does not go away. °· Keep any follow-up appointments you made. If you do not go to an appointment, you could develop lasting (chronic) problems with pain. If there is any problem keeping an appointment, you must call to reschedule. °SEEK MEDICAL CARE IF:  °· You think you are having problems from the medicine you are taking. Read your medicine instructions carefully. °· Your chest pain does not go away, even after treatment. °· You develop a rash with blisters on your chest. °SEEK IMMEDIATE MEDICAL CARE IF:  °· You have increased chest pain or pain that spreads to your arm, neck, jaw, back, or abdomen. °· You develop shortness of breath, an increasing cough, or you are coughing up blood. °· You have severe back or abdominal pain, feel nauseous, or vomit. °· You develop severe weakness, fainting, or chills. °· You have a fever. °THIS IS AN EMERGENCY. Do not wait to see if the pain will go away. Get medical help at once. Call your local emergency services (911 in [place] Do not drive yourself to the hospital. °MAKE SURE YOU:  °· Understand these instructions. °· Will watch your condition. °· Will get help right away if you are not doing well or get worse. °Document Released: 01/07/2005 Document Revised: 06/22/2011 Document Reviewed: 11/03/2007 °[employer]® Patient Information ©2014 [employer]. ° °

## 2013-04-24 NOTE — Progress Notes (Signed)
Pre visit review using our clinic review tool, if applicable. No additional management support is needed unless otherwise documented below in the visit note. 

## 2013-04-25 ENCOUNTER — Encounter: Payer: Self-pay | Admitting: Internal Medicine

## 2013-04-25 ENCOUNTER — Telehealth: Payer: Self-pay | Admitting: *Deleted

## 2013-04-25 DIAGNOSIS — R748 Abnormal levels of other serum enzymes: Secondary | ICD-10-CM | POA: Insufficient documentation

## 2013-04-25 LAB — COMPREHENSIVE METABOLIC PANEL
ALT: 35 U/L (ref 0–35)
AST: 23 U/L (ref 0–37)
Albumin: 3.7 g/dL (ref 3.5–5.2)
Alkaline Phosphatase: 236 U/L — ABNORMAL HIGH (ref 39–117)
BUN: 16 mg/dL (ref 6–23)
CALCIUM: 9.4 mg/dL (ref 8.4–10.5)
CHLORIDE: 99 meq/L (ref 96–112)
CO2: 26 meq/L (ref 19–32)
Creatinine, Ser: 1 mg/dL (ref 0.4–1.2)
GFR: 74.96 mL/min (ref >60.00)
Glucose, Bld: 103 mg/dL — ABNORMAL HIGH (ref 70–99)
POTASSIUM: 3.5 meq/L (ref 3.5–5.1)
SODIUM: 135 meq/L (ref 135–145)
TOTAL PROTEIN: 8.2 g/dL (ref 6.0–8.3)
Total Bilirubin: 0.3 mg/dL (ref 0.3–1.2)

## 2013-04-25 LAB — LUTEINIZING HORMONE: LH: 12.23 m[IU]/mL

## 2013-04-25 LAB — FOLLICLE STIMULATING HORMONE: FSH: 5.3 m[IU]/mL

## 2013-04-25 LAB — CARDIAC PANEL
CK TOTAL: 78 U/L (ref 7–177)
CK-MB: 0.6 ng/mL (ref 0.3–4.0)
RELATIVE INDEX: 0.8 calc (ref 0.0–2.5)

## 2013-04-25 LAB — TSH: TSH: 3.31 u[IU]/mL (ref 0.35–5.50)

## 2013-04-25 NOTE — Assessment & Plan Note (Signed)
I will check her labs to look for secondary causes for this I believe that this will eventually be found to be deconditioning and body habitus

## 2013-04-25 NOTE — Assessment & Plan Note (Signed)
[place] and LH ar normal - I don't think this is menopause quite yet

## 2013-04-25 NOTE — Assessment & Plan Note (Signed)
Her other LFT's are normal I have asked her to return to check the isoenzymes

## 2013-04-25 NOTE — Progress Notes (Signed)
Subjective:    Patient ID: [name], female    DOB: 09/12/1965, 48 y.o.   MRN: [id number]  Chest Pain  This is a new problem. The current episode started in the past 7 days. The onset quality is gradual. The problem occurs intermittently. The problem has been unchanged. The pain is present in the substernal region. The pain is at a severity of 2/10. The pain is mild. The quality of the pain is described as sharp. The pain does not radiate. Associated symptoms include diaphoresis ("hot flashes") and malaise/fatigue. Pertinent negatives include no abdominal pain, back pain, claudication, cough, dizziness, exertional chest pressure, fever, headaches, hemoptysis, irregular heartbeat, leg pain, lower extremity edema, nausea, near-syncope, numbness, orthopnea, palpitations, PND, shortness of breath, sputum production, syncope, vomiting or weakness. The pain is aggravated by nothing. She has tried nothing for the symptoms. Risk factors include obesity.  Pertinent negatives for past medical history include no seizures.      Review of Systems  Constitutional: Positive for malaise/fatigue and diaphoresis ("hot flashes"). Negative for fever, chills, activity change, appetite change and unexpected weight change.  HENT: Negative.   Eyes: Negative.   Respiratory: Negative.  Negative for apnea, cough, hemoptysis, sputum production, choking, chest tightness, shortness of breath, wheezing and stridor.   Cardiovascular: Positive for chest pain. Negative for palpitations, orthopnea, claudication, leg swelling, syncope, PND and near-syncope.  Gastrointestinal: Negative.  Negative for nausea, vomiting, abdominal pain, diarrhea and constipation.  Endocrine: Negative.   Genitourinary: Negative.   Musculoskeletal: Negative.  Negative for arthralgias, back pain, gait problem, joint swelling, myalgias, neck pain and neck stiffness.  Skin: Negative.   Allergic/Immunologic: Negative.   Neurological: Negative.   Negative for dizziness, seizures, speech difficulty, weakness, numbness and headaches.  Hematological: Negative.  Negative for adenopathy. Does not bruise/bleed easily.  Psychiatric/Behavioral: Negative.        Objective:   Physical Exam  Vitals reviewed. Constitutional: She is oriented to person, place, and time. She appears well-developed and well-nourished. No distress.  HENT:  Head: Normocephalic and atraumatic.  Mouth/Throat: Oropharynx is clear and moist. No oropharyngeal exudate.  Eyes: Conjunctivae are normal. Right eye exhibits no discharge. Left eye exhibits no discharge. No scleral icterus.  Neck: Normal range of motion. Neck supple. No JVD present. No tracheal deviation present. No thyromegaly present.  Cardiovascular: Normal rate, regular rhythm, normal heart sounds and intact distal pulses.  Exam reveals no gallop and no friction rub.   No murmur heard. Pulmonary/Chest: Effort normal and breath sounds normal. No stridor. No respiratory distress. She has no wheezes. She has no rales. Chest wall is not dull to percussion. She exhibits tenderness. She exhibits no mass, no bony tenderness, no laceration, no crepitus, no edema, no deformity, no swelling and no retraction.    Abdominal: Soft. Bowel sounds are normal. She exhibits no distension and no mass. There is no tenderness. There is no rebound and no guarding.  Musculoskeletal: Normal range of motion. She exhibits no edema and no tenderness.  Lymphadenopathy:    She has no cervical adenopathy.  Neurological: She is oriented to person, place, and time.  Skin: Skin is warm and dry. No rash noted. She is not diaphoretic. No erythema. No pallor.  Psychiatric: She has a normal mood and affect. Her behavior is normal. Judgment and thought content normal.     Lab Results  Component Value Date   WBC 11.5* 04/24/2013   HGB 12.9 04/24/2013   HCT 38.1 04/24/2013   PLT 325.0  04/24/2013   GLUCOSE 103* 04/24/2013   CHOL 140 09/24/2011    TRIG 80.0 09/24/2011   HDL 49.20 09/24/2011   LDLCALC 75 09/24/2011   ALT 35 04/24/2013   AST 23 04/24/2013   NA 135 04/24/2013   K 3.5 04/24/2013   CL 99 04/24/2013   CREATININE 1.0 04/24/2013   BUN 16 04/24/2013   CO2 26 04/24/2013   TSH 3.31 04/24/2013   INR 1.0 05/25/2007   HGBA1C 6.0 12/25/2010       Assessment & Plan:

## 2013-04-25 NOTE — Assessment & Plan Note (Signed)
Her BP is well controlled 

## 2013-04-25 NOTE — Telephone Encounter (Signed)
[facility] Fax: [phone] From: [facility] Date/ Time: 04/24/2013 7:11 PM Taken By: Marcello Moores, CSR Caller: [place]: Randell Loop Patient: [name] DOB: [birth year] Phone: [phone] Reason for Call: Shirlean Mylar is calling from [place] regarding a Traponin I ordered on [place], [place] by Marcello Moores Jones1/03/2014 4:42:00 PM. The results were Trapnonin I = 0.01 normal, no indication of miocardial injury. Regarding Appointment: Appt Date: Appt Time: Unknown Provider: Reason

## 2013-04-25 NOTE — Assessment & Plan Note (Signed)
Her EKG shows diffuse NS - ST/T wave inversions and flattening I will check her cardiac enzymes to see if there is concern for ischemia She does have some ttp so I think this will ultimately be costochindritis

## 2013-04-28 ENCOUNTER — Other Ambulatory Visit: Payer: Self-pay | Admitting: Internal Medicine

## 2013-04-28 ENCOUNTER — Other Ambulatory Visit: Payer: 59

## 2013-04-28 DIAGNOSIS — R748 Abnormal levels of other serum enzymes: Secondary | ICD-10-CM

## 2013-05-01 ENCOUNTER — Encounter: Payer: Self-pay | Admitting: Internal Medicine

## 2013-05-02 ENCOUNTER — Encounter: Payer: Self-pay | Admitting: Internal Medicine

## 2013-05-03 ENCOUNTER — Encounter: Payer: Self-pay | Admitting: Internal Medicine

## 2013-05-03 ENCOUNTER — Other Ambulatory Visit: Payer: Self-pay | Admitting: Internal Medicine

## 2013-05-03 DIAGNOSIS — R748 Abnormal levels of other serum enzymes: Secondary | ICD-10-CM

## 2013-05-03 LAB — ALKALINE PHOSPHATASE ISOENZYMES
Alkaline Phonsphatase: 283 U/L — ABNORMAL HIGH (ref 33–115)
Bone Isoenzymes: 5 % — ABNORMAL LOW (ref 28–66)
Intestinal Isoenzymes: 0 % — ABNORMAL LOW (ref 1–24)
LIVER ISOENZYMES (ALP ISO): 78 % — AB (ref 25–69)
MACROHEPATIC ISOENZYMES: 17 % — AB

## 2013-05-08 ENCOUNTER — Encounter: Payer: Self-pay | Admitting: Internal Medicine

## 2013-05-08 ENCOUNTER — Ambulatory Visit
Admission: RE | Admit: 2013-05-08 | Discharge: 2013-05-08 | Disposition: A | Discharge status: Home / Self Care | Payer: 59 | Source: Ambulatory Visit | Attending: Internal Medicine | Admitting: Internal Medicine

## 2013-05-08 DIAGNOSIS — R748 Abnormal levels of other serum enzymes: Secondary | ICD-10-CM

## 2013-05-15 ENCOUNTER — Ambulatory Visit: Payer: 59 | Admitting: Internal Medicine

## 2013-05-22 ENCOUNTER — Ambulatory Visit: Payer: 59 | Admitting: Internal Medicine

## 2013-05-22 DIAGNOSIS — Z0289 Encounter for other administrative examinations: Secondary | ICD-10-CM

## 2013-06-01 ENCOUNTER — Ambulatory Visit: Payer: 59 | Admitting: Internal Medicine

## 2013-06-13 ENCOUNTER — Encounter: Payer: Self-pay | Admitting: Internal Medicine

## 2013-06-13 MED ORDER — LOSARTAN POTASSIUM-HCTZ 100-25 MG PO TABS: 1.0000 | ORAL_TABLET | Freq: Every day | ORAL | Status: DC

## 2013-06-16 ENCOUNTER — Telehealth: Payer: Self-pay | Admitting: *Deleted

## 2013-06-16 NOTE — Telephone Encounter (Signed)
Patient phoned requesting refill on Hyzaar-per MAR review, pharmacy confirmed receipt 06/13/13 @ 1530.  Patient notified.

## 2013-07-21 ENCOUNTER — Encounter: Payer: Self-pay | Admitting: Internal Medicine

## 2013-08-28 ENCOUNTER — Encounter: Payer: Self-pay | Admitting: Internal Medicine

## 2013-08-28 ENCOUNTER — Ambulatory Visit (INDEPENDENT_AMBULATORY_CARE_PROVIDER_SITE_OTHER): Payer: 59 | Admitting: Internal Medicine

## 2013-08-28 VITALS — BP 120/80 | HR 80 | Temp 97.1°F | Resp 16 | Ht 63.0 in | Wt 297.0 lb

## 2013-08-28 DIAGNOSIS — E876 Hypokalemia: Secondary | ICD-10-CM

## 2013-08-28 DIAGNOSIS — M300 Polyarteritis nodosa: Secondary | ICD-10-CM

## 2013-08-28 DIAGNOSIS — I10 Essential (primary) hypertension: Secondary | ICD-10-CM

## 2013-08-28 DIAGNOSIS — I776 Arteritis, unspecified: Secondary | ICD-10-CM

## 2013-08-28 NOTE — Progress Notes (Signed)
Pre visit review using our clinic review tool, if applicable. No additional management support is needed unless otherwise documented below in the visit note. 

## 2013-08-28 NOTE — Progress Notes (Signed)
   Subjective:    Patient ID: [name], female    DOB: 1965-12-25, 48 y.o.   MRN: [id number]  HPI Comments: She returns complaining of painful nodules on both lower legs for 2 weeks.     Review of Systems  Constitutional: Negative.  Negative for fever, chills, diaphoresis, appetite change and fatigue.  HENT: Negative.   Eyes: Negative.   Respiratory: Negative.  Negative for cough, choking, chest tightness, shortness of breath and stridor.   Cardiovascular: Positive for leg swelling. Negative for chest pain and palpitations.  Gastrointestinal: Negative.  Negative for nausea, vomiting, abdominal pain, diarrhea, constipation and blood in stool.  Endocrine: Negative.   Genitourinary: Negative.   Musculoskeletal: Positive for arthralgias. Negative for back pain, gait problem, joint swelling, myalgias, neck pain and neck stiffness.  Skin: Positive for color change. Negative for pallor, rash and wound.  Allergic/Immunologic: Negative.   Neurological: Negative.  Negative for dizziness, tremors, facial asymmetry, speech difficulty, weakness, light-headedness and numbness.  Hematological: Negative.   Psychiatric/Behavioral: Negative.        Objective:   Physical Exam  Vitals reviewed. Constitutional: She is oriented to person, place, and time. She appears well-developed and well-nourished. No distress.  HENT:  Head: Normocephalic and atraumatic.  Mouth/Throat: Oropharynx is clear and moist. No oropharyngeal exudate.  Eyes: Conjunctivae are normal. Right eye exhibits no discharge. Left eye exhibits no discharge. No scleral icterus.  Neck: Normal range of motion. Neck supple. No JVD present. No tracheal deviation present. No thyromegaly present.  Cardiovascular: Normal rate, regular rhythm, normal heart sounds and intact distal pulses.  Exam reveals no gallop and no friction rub.   No murmur heard. Pulmonary/Chest: Effort normal and breath sounds normal. No stridor. No respiratory  distress. She has no wheezes. She has no rales. She exhibits no tenderness.  Abdominal: Soft. Bowel sounds are normal. She exhibits no distension and no mass. There is no tenderness. There is no rebound and no guarding.  Musculoskeletal: Normal range of motion. She exhibits no edema and no tenderness.       Right lower leg: She exhibits swelling and deformity. She exhibits no bony tenderness, no edema and no laceration.       Left lower leg: She exhibits swelling and deformity. She exhibits no bony tenderness, no edema and no laceration.       Legs: There is no edema, the calves are not tender  Lymphadenopathy:    She has no cervical adenopathy.  Neurological: She is oriented to person, place, and time.  Skin: Skin is warm and dry. No rash noted. She is not diaphoretic. No erythema. No pallor.  Psychiatric: She has a normal mood and affect. Her behavior is normal. Judgment and thought content normal.     Lab Results  Component Value Date   WBC 11.5* 04/24/2013   HGB 12.9 04/24/2013   HCT 38.1 04/24/2013   PLT 325.0 04/24/2013   GLUCOSE 103* 04/24/2013   CHOL 140 09/24/2011   TRIG 80.0 09/24/2011   HDL 49.20 09/24/2011   LDLCALC 75 09/24/2011   ALT 35 04/24/2013   AST 23 04/24/2013   NA 135 04/24/2013   K 3.5 04/24/2013   CL 99 04/24/2013   CREATININE 1.0 04/24/2013   BUN 16 04/24/2013   CO2 26 04/24/2013   TSH 3.31 04/24/2013   INR 1.0 05/25/2007   HGBA1C 6.0 12/25/2010       Assessment & Plan:

## 2013-08-28 NOTE — Patient Instructions (Signed)

## 2013-08-29 ENCOUNTER — Ambulatory Visit: Payer: 59 | Admitting: Internal Medicine

## 2013-08-29 MED ORDER — METHYLPREDNISOLONE ACETATE 80 MG/ML IJ SUSP: 120.0000 mg | Freq: Once | INTRAMUSCULAR | Status: DC

## 2013-08-29 NOTE — Assessment & Plan Note (Signed)
She is having a flare up of this so I have given her an injection of depo-medrol IM Also, I have asked her to f/up with rheumatology

## 2013-08-29 NOTE — Assessment & Plan Note (Signed)
Her BP is well controlled 

## 2013-08-31 ENCOUNTER — Ambulatory Visit: Payer: 59 | Admitting: Internal Medicine

## 2013-10-16 ENCOUNTER — Other Ambulatory Visit: Payer: Self-pay

## 2013-10-16 DIAGNOSIS — Z1231 Encounter for screening mammogram for malignant neoplasm of breast: Secondary | ICD-10-CM

## 2013-10-23 ENCOUNTER — Encounter: Payer: Self-pay | Admitting: Internal Medicine

## 2013-11-29 ENCOUNTER — Encounter: Payer: 59 | Admitting: Internal Medicine

## 2013-11-30 ENCOUNTER — Ambulatory Visit: Payer: 59 | Admitting: Internal Medicine

## 2013-12-04 ENCOUNTER — Ambulatory Visit
Admission: RE | Admit: 2013-12-04 | Discharge: 2013-12-04 | Disposition: A | Discharge status: Home / Self Care | Payer: 59 | Source: Ambulatory Visit

## 2013-12-04 DIAGNOSIS — Z1231 Encounter for screening mammogram for malignant neoplasm of breast: Secondary | ICD-10-CM

## 2013-12-05 LAB — HM MAMMOGRAPHY: HM Mammogram: NORMAL

## 2014-01-22 ENCOUNTER — Emergency Department (HOSPITAL_BASED_OUTPATIENT_CLINIC_OR_DEPARTMENT_OTHER)
Admission: EM | Admit: 2014-01-22 | Discharge: 2014-01-22 | Disposition: A | Discharge status: Home / Self Care | Payer: 59 | Attending: Emergency Medicine | Admitting: Emergency Medicine

## 2014-01-22 ENCOUNTER — Encounter (HOSPITAL_BASED_OUTPATIENT_CLINIC_OR_DEPARTMENT_OTHER): Payer: Self-pay | Admitting: Emergency Medicine

## 2014-01-22 DIAGNOSIS — Z8739 Personal history of other diseases of the musculoskeletal system and connective tissue: Secondary | ICD-10-CM | POA: Insufficient documentation

## 2014-01-22 DIAGNOSIS — Z86010 Personal history of colonic polyps: Secondary | ICD-10-CM | POA: Diagnosis not present

## 2014-01-22 DIAGNOSIS — Z90710 Acquired absence of both cervix and uterus: Secondary | ICD-10-CM | POA: Diagnosis not present

## 2014-01-22 DIAGNOSIS — B373 Candidiasis of vulva and vagina: Secondary | ICD-10-CM | POA: Insufficient documentation

## 2014-01-22 DIAGNOSIS — Z79899 Other long term (current) drug therapy: Secondary | ICD-10-CM | POA: Insufficient documentation

## 2014-01-22 DIAGNOSIS — B3731 Acute candidiasis of vulva and vagina: Secondary | ICD-10-CM

## 2014-01-22 DIAGNOSIS — E669 Obesity, unspecified: Secondary | ICD-10-CM | POA: Insufficient documentation

## 2014-01-22 DIAGNOSIS — A5901 Trichomonal vulvovaginitis: Secondary | ICD-10-CM | POA: Insufficient documentation

## 2014-01-22 DIAGNOSIS — A599 Trichomoniasis, unspecified: Secondary | ICD-10-CM

## 2014-01-22 DIAGNOSIS — Z8673 Personal history of transient ischemic attack (TIA), and cerebral infarction without residual deficits: Secondary | ICD-10-CM | POA: Diagnosis not present

## 2014-01-22 DIAGNOSIS — I10 Essential (primary) hypertension: Secondary | ICD-10-CM | POA: Diagnosis not present

## 2014-01-22 DIAGNOSIS — R21 Rash and other nonspecific skin eruption: Secondary | ICD-10-CM | POA: Diagnosis present

## 2014-01-22 LAB — WET PREP, GENITAL: YEAST WET PREP: NONE SEEN

## 2014-01-22 MED ORDER — METRONIDAZOLE 500 MG PO TABS
2000.0000 mg | ORAL_TABLET | Freq: Once | ORAL | Status: AC
Start: 2014-01-22 — End: 2014-01-22
  Administered 2014-01-22: 2000 mg via ORAL
  Filled 2014-01-22: qty 4

## 2014-01-22 MED ORDER — DOXYCYCLINE HYCLATE 100 MG PO CAPS: 100.0000 mg | ORAL_CAPSULE | Freq: Two times a day (BID) | ORAL | Status: DC

## 2014-01-22 MED ORDER — CEFTRIAXONE SODIUM 250 MG IJ SOLR
250.0000 mg | Freq: Once | INTRAMUSCULAR | Status: AC
  Administered 2014-01-22: 250 mg via INTRAMUSCULAR
  Filled 2014-01-22: qty 250

## 2014-01-22 MED ORDER — FLUCONAZOLE 150 MG PO TABS: 150.0000 mg | ORAL_TABLET | Freq: Once | ORAL | Status: DC

## 2014-01-22 NOTE — Discharge Instructions (Signed)
Candidal Vulvovaginitis Candidal vulvovaginitis is an infection of the vagina and vulva. The vulva is the skin around the opening of the vagina. This may cause itching and discomfort in and around the vagina.  HOME CARE  Only take medicine as told by your doctor.  Do not have sex (intercourse) until the infection is healed or as told by your doctor.  Practice safe sex.  Tell your sex partner about your infection.  Do not douche or use tampons.  Wear cotton underwear. Do not wear tight pants or panty hose.  Eat yogurt. This may help treat and prevent yeast infections. GET HELP RIGHT AWAY IF:   You have a fever.  Your problems get worse during treatment or do not get better in 3 days.  You have discomfort, irritation, or itching in your vagina or vulva area.  You have pain after sex.  You start to get belly (abdominal) pain. MAKE SURE YOU:  Understand these instructions.  Will watch your condition.  Will get help right away if you are not doing well or get worse. Document Released: 06/26/2008 Document Revised: 04/04/2013 Document Reviewed: 06/26/2008 [facility] Patient Information 2015 [place], [place]. This information is not intended to replace advice given to you by your health care provider. Make sure you discuss any questions you have with your health care provider. Trichomoniasis Trichomoniasis is an infection caused by an organism called Trichomonas. The infection can affect both women and men. In women, the outer female genitalia and the vagina are affected. In men, the penis is mainly affected, but the prostate and other reproductive organs can also be involved. Trichomoniasis is a sexually transmitted infection (STI) and is most often passed to another person through sexual contact.  RISK FACTORS  Having unprotected sexual intercourse.  Having sexual intercourse with an infected partner. SIGNS AND SYMPTOMS  Symptoms of trichomoniasis in women include:  Abnormal  gray-green frothy vaginal discharge.  Itching and irritation of the vagina.  Itching and irritation of the area outside the vagina. Symptoms of trichomoniasis in men include:   Penile discharge with or without pain.  Pain during urination. This results from inflammation of the urethra. DIAGNOSIS  Trichomoniasis may be found during a Pap test or physical exam. Your health care provider may use one of the following methods to help diagnose this infection:  Examining vaginal discharge under a microscope. For men, urethral discharge would be examined.  Testing the pH of the vagina with a test tape.  Using a vaginal swab test that checks for the Trichomonas organism. A test is available that provides results within a few minutes.  Doing a culture test for the organism. This is not usually needed. TREATMENT   You may be given medicine to fight the infection. Women should inform their health care provider if they could be or are pregnant. Some medicines used to treat the infection should not be taken during pregnancy.  Your health care provider may recommend over-the-counter medicines or creams to decrease itching or irritation.  Your sexual partner will need to be treated if infected. HOME CARE INSTRUCTIONS   Take medicines only as directed by your health care provider.  Take over-the-counter medicine for itching or irritation as directed by your health care provider.  Do not have sexual intercourse while you have the infection.  Women should not douche or wear tampons while they have the infection.  Discuss your infection with your partner. Your partner may have gotten the infection from you, or you may have  gotten it from your partner.  Have your sex partner get examined and treated if necessary.  Practice safe, informed, and protected sex.  See your health care provider for other STI testing. SEEK MEDICAL CARE IF:   You still have symptoms after you finish your  medicine.  You develop abdominal pain.  You have pain when you urinate.  You have bleeding after sexual intercourse.  You develop a rash.  Your medicine makes you sick or makes you throw up (vomit). MAKE SURE YOU:  Understand these instructions.  Will watch your condition.  Will get help right away if you are not doing well or get worse. Document Released: 09/23/2000 Document Revised: 08/14/2013 Document Reviewed: 01/09/2013 [facility] Patient Information 2015 [place], [place]. This information is not intended to replace advice given to you by your health care provider. Make sure you discuss any questions you have with your health care provider.

## 2014-01-22 NOTE — ED Notes (Signed)
Rash on her vagina and inner thighs. She thinks she is irritated from body wash.

## 2014-01-22 NOTE — ED Provider Notes (Addendum)
CSN: [id number]     Arrival date & time 01/22/14  1731 History  This chart was scribed for Charlesetta Shanks, MD by Rayfield Citizen, ED Scribe. This patient was seen in room MH11/MH11 and the patient's care was started at 6:59 PM.    Chief Complaint  Patient presents with  . Rash   The history is provided by the patient. No language interpreter was used.   HPI Comments: [name] is a 49 y.o. female who presents to the Emergency Department complaining of 1 week of rash on the thighs and groin, worsening four days PTA. She reports that it has not spread. She denies any other symptoms. She reports that all of her itching is external around the vaginal area. She denies that she's had any pain, or pain with urination. The patient denies sexually activity and balance possibility of sexually transmitted disease. She tried some Vagisil without relief.   Past Medical History  Diagnosis Date  . History of colonic polyps   . Headache(784.0)   . Hypertension   . Osteoarthritis   . TIA (transient ischemic attack) 2007    hx of  . Obesity   . Vasculitis     L leg - Polyarthritis  . Herniated disc    Past Surgical History  Procedure Laterality Date  . Abdominal hysterectomy     Family History  Problem Relation Age of Onset  . Arthritis Other   . Hypertension Other   . Stroke Other   . Stroke Mother   . Hypertension Mother   . Hypertension Father   . Cancer Father     Prostate   History  Substance Use Topics  . Smoking status: Never Smoker   . Smokeless tobacco: Never Used  . Alcohol Use: No     Comment: occasionally   OB History   Grav Para Term Preterm Abortions TAB SAB Ect Mult Living                 Review of Systems  Constitutional: Negative for fever and chills.  Respiratory: Negative for shortness of breath.   Gastrointestinal: Negative for nausea, vomiting, abdominal pain and diarrhea.  Genitourinary: Negative for dysuria, urgency, frequency and hematuria.  Skin:  Positive for rash.    Allergies  Review of patient's allergies indicates no known allergies.  Home Medications   Prior to Admission medications   Medication Sig Start Date End Date Taking? Authorizing Provider  doxycycline (VIBRAMYCIN) 100 MG capsule Take 1 capsule (100 mg total) by mouth 2 (two) times daily. One po bid x 7 days 01/22/14   Charlesetta Shanks, MD  fluconazole (DIFLUCAN) 150 MG tablet Take 1 tablet (150 mg total) by mouth once. 01/22/14   Charlesetta Shanks, MD  losartan-hydrochlorothiazide (HYZAAR) 100-25 MG per tablet Take 1 tablet by mouth daily. 06/13/13   Janith Lima, MD   BP 114/60  Pulse 92  Temp(Src) 98.2 F (36.8 C) (Oral)  Resp 20  Ht 5\' 1"  (1.549 m)  Wt 297 lb (134.718 kg)  BMI 56.15 kg/m2  SpO2 98% Physical Exam  Nursing note and vitals reviewed. Eyes: EOM are normal.  Psychiatric: She has a normal mood and affect. Her behavior is normal.   abdomen: Obese soft nontender. Genital examination: External examination shows some diffuse edema of the external female genitalia, there are no isolated lesions no ulcerations, speculum exam shows moderate whitish discharge in the vault. Cervix is nontender to palpation.  ED Course  Procedures   DIAGNOSTIC STUDIES:  Oxygen Saturation is 100% on RA, normal by my interpretation.    COORDINATION OF CARE: 9:29 PM Discussed treatment plan with pt at bedside and pt agreed to plan.   Labs Review Labs Reviewed  WET PREP, GENITAL - Abnormal; Notable for the following:    Trich, Wet Prep MODERATE (*)    Clue Cells Wet Prep HPF POC FEW (*)    WBC, Wet Prep HPF POC TOO NUMEROUS TO COUNT (*)    All other components within normal limits  GC/CHLAMYDIA PROBE AMP    Imaging Review No results found.   EKG Interpretation None      MDM   Final diagnoses:  Yeast vaginitis  Trichimoniasis   At this time findings are most consistent with a yeast vaginitis. There appears to be low probability of PID with no pelvic pain.  Patient is otherwise well, without nausea vomiting or abdominal pain.  Wet prep has returned positive for Trichomonas. Based on this, the patient will emirically be treated for cervicitis as well. I personally performed the services described in this documentation, which was scribed in my presence. The recorded information has been reviewed and is accurate.     Charlesetta Shanks, MD 01/22/14 0092  Charlesetta Shanks, MD 01/22/14 2129

## 2014-01-23 ENCOUNTER — Ambulatory Visit: Payer: 59 | Admitting: Family

## 2014-01-23 LAB — GC/CHLAMYDIA PROBE AMP
CT PROBE, AMP APTIMA: NEGATIVE
GC Probe RNA: NEGATIVE

## 2014-01-31 ENCOUNTER — Telehealth: Payer: Self-pay | Admitting: Internal Medicine

## 2014-01-31 ENCOUNTER — Other Ambulatory Visit: Payer: Self-pay | Admitting: Internal Medicine

## 2014-01-31 MED ORDER — FLUCONAZOLE 150 MG PO TABS: 150.0000 mg | ORAL_TABLET | Freq: Once | ORAL | Status: DC

## 2014-01-31 NOTE — Telephone Encounter (Signed)
Notified pt md sent diflucan to her pharmacy...Johny Chess

## 2014-01-31 NOTE — Telephone Encounter (Signed)
done

## 2014-01-31 NOTE — Telephone Encounter (Signed)
Pt requesting call back regarding possible yeast infection and possible medication. [phone] or [phone]

## 2014-02-19 ENCOUNTER — Ambulatory Visit (INDEPENDENT_AMBULATORY_CARE_PROVIDER_SITE_OTHER): Payer: 59 | Admitting: Internal Medicine

## 2014-02-19 ENCOUNTER — Encounter: Payer: Self-pay | Admitting: Internal Medicine

## 2014-02-19 ENCOUNTER — Ambulatory Visit (INDEPENDENT_AMBULATORY_CARE_PROVIDER_SITE_OTHER)
Admission: RE | Admit: 2014-02-19 | Discharge: 2014-02-19 | Disposition: A | Discharge status: Home / Self Care | Payer: 59 | Source: Ambulatory Visit | Attending: Internal Medicine | Admitting: Internal Medicine

## 2014-02-19 VITALS — BP 130/88 | HR 71 | Temp 98.3°F | Resp 14 | Wt 321.0 lb

## 2014-02-19 DIAGNOSIS — R05 Cough: Secondary | ICD-10-CM

## 2014-02-19 DIAGNOSIS — R059 Cough, unspecified: Secondary | ICD-10-CM

## 2014-02-19 DIAGNOSIS — R0600 Dyspnea, unspecified: Secondary | ICD-10-CM

## 2014-02-19 DIAGNOSIS — L52 Erythema nodosum: Secondary | ICD-10-CM

## 2014-02-19 DIAGNOSIS — R062 Wheezing: Secondary | ICD-10-CM

## 2014-02-19 MED ORDER — BUDESONIDE-FORMOTEROL FUMARATE 160-4.5 MCG/ACT IN AERO: 2.0000 | INHALATION_SPRAY | Freq: Two times a day (BID) | RESPIRATORY_TRACT | Status: DC

## 2014-02-19 MED ORDER — MONTELUKAST SODIUM 10 MG PO TABS: 10.0000 mg | ORAL_TABLET | Freq: Every day | ORAL | Status: DC

## 2014-02-19 NOTE — Progress Notes (Signed)
Pre visit review using our clinic review tool, if applicable. No additional management support is needed unless otherwise documented below in the visit note. 

## 2014-02-19 NOTE — Progress Notes (Signed)
   Subjective:    Patient ID: [name], female    DOB: 11/18/1965, 48 y.o.   MRN: [id number]  HPI  She began having shortness of breath and wheezing 02/17/14. Today she developed a nonproductive cough.  She had a history of shortness of breath and wheezing only once before in 2013 with a respiratory tract infection  She's never smoked. She has no definite history of asthma.  She has no symptoms of upper respiratory tract infection or extrinsic symptoms at this time.  She does have a history of erythema nodosum in the shins but no history of sarcoidosis.The chart has a diagnosis of polyarteritis nodosa; she states that her Rheumatologist disagrees with this diagnosis.  The chart also lists a diagnosis of elevation of alkaline phosphatase.   Review of Systems Frontal headache, facial pain , nasal purulence, dental pain, sore throat , otic pain or otic discharge denied. No fever , chills or sweats. Extrinsic symptoms of itchy, watery eyes, sneezing, or angioedema are denied. There is no paroxysmal nocturnal dyspnea. She has no chest pain or hemoptysis.  She denies any reflux symptoms, hoarseness, dysphagia.     Objective:   Physical Exam   Positive or pertinent findings include: She has plaque-like dermatitis in the right shin > the left. There is tenderness to palpation in these areas.  General appearance:Weight excess as per CDC guidelines;good health ;well nourished; no acute distress or increased work of breathing is present.  No  lymphadenopathy about the head, neck, or axilla noted.   Eyes: No conjunctival inflammation or lid edema is present. There is no scleral icterus.  Ears:  External ear exam shows no significant lesions or deformities.  Otoscopic examination reveals clear canals, tympanic membranes are intact bilaterally without bulging, retraction, inflammation or discharge.  Nose:  External nasal examination shows no deformity or inflammation. Nasal mucosa are  pink and moist without lesions or exudates. No septal dislocation or deviation.No obstruction to airflow.   Oral exam: Dental hygiene is good; lips and gums are healthy appearing.There is no oropharyngeal erythema or exudate noted.   Neck:  No deformities, thyromegaly, masses, or tenderness noted.   Supple with full range of motion without pain.   Heart:  Normal rate and regular rhythm. S1 and S2 normal without gallop, murmur, click, rub or other extra sounds.   Lungs:Chest clear to auscultation; no wheezes, rhonchi,rales ,or rubs present.No increased work of breathing.    Extremities:  No cyanosis, edema, or clubbing  noted . Negatiive Homan's   Skin: Warm & dry w/o  tenting.           Assessment & Plan:  #1shortness of breath and wheezing in a nonsmoker with no history of asthma  #2 nonproductive cough  #3 erythema nodosum Plan: Chex x-ray will be done to rule out any hilar lymphadenopathy.  ACE level may need to be considered if x-ray is suggestive  Singulair and inhaled Symbicort will be prescribed. History &  physical do not suggest a bacterial bronchitis.  If symptoms persist or progress; PFTs will be considered

## 2014-02-19 NOTE — Patient Instructions (Signed)
Your next office appointment will be determined based upon review of your pending  x-rays. Those instructions will be transmitted to you through My Chart

## 2014-02-20 ENCOUNTER — Encounter: Payer: Self-pay | Admitting: Internal Medicine

## 2014-03-22 ENCOUNTER — Encounter: Payer: Self-pay | Admitting: Internal Medicine

## 2014-03-25 ENCOUNTER — Other Ambulatory Visit: Payer: Self-pay | Admitting: Internal Medicine

## 2014-03-25 DIAGNOSIS — I776 Arteritis, unspecified: Secondary | ICD-10-CM

## 2014-03-25 DIAGNOSIS — M300 Polyarteritis nodosa: Secondary | ICD-10-CM

## 2014-04-02 ENCOUNTER — Ambulatory Visit: Payer: 59 | Admitting: Internal Medicine

## 2014-04-05 ENCOUNTER — Ambulatory Visit (INDEPENDENT_AMBULATORY_CARE_PROVIDER_SITE_OTHER): Payer: 59 | Admitting: Internal Medicine

## 2014-04-05 ENCOUNTER — Encounter: Payer: Self-pay | Admitting: Internal Medicine

## 2014-04-05 VITALS — BP 116/82 | HR 97 | Temp 98.1°F | Resp 18 | Ht 61.0 in | Wt 313.4 lb

## 2014-04-05 DIAGNOSIS — M300 Polyarteritis nodosa: Secondary | ICD-10-CM

## 2014-04-05 DIAGNOSIS — I10 Essential (primary) hypertension: Secondary | ICD-10-CM

## 2014-04-05 DIAGNOSIS — L52 Erythema nodosum: Secondary | ICD-10-CM

## 2014-04-05 MED ORDER — METHYLPREDNISOLONE ACETATE 80 MG/ML IJ SUSP: 80.0000 mg | Freq: Once | INTRAMUSCULAR | Status: DC

## 2014-04-05 MED ORDER — METHYLPREDNISOLONE ACETATE 80 MG/ML IJ SUSP
80.0000 mg | Freq: Once | INTRAMUSCULAR | Status: AC
  Administered 2014-04-05: 80 mg via INTRAMUSCULAR
  Administered 2014-04-05: 40 mg via INTRAMUSCULAR

## 2014-04-05 MED ORDER — COLCHICINE 0.6 MG PO TABS: 0.6000 mg | ORAL_TABLET | Freq: Two times a day (BID) | ORAL | Status: DC

## 2014-04-05 MED ORDER — OXYCODONE-ACETAMINOPHEN 7.5-325 MG PO TABS: 1.0000 | ORAL_TABLET | Freq: Three times a day (TID) | ORAL | Status: DC | PRN

## 2014-04-05 NOTE — Patient Instructions (Signed)

## 2014-04-05 NOTE — Progress Notes (Signed)
Pre visit review using our clinic review tool, if applicable. No additional management support is needed unless otherwise documented below in the visit note. 

## 2014-04-08 NOTE — Assessment & Plan Note (Signed)
She appears to be having a flare of this. Will treat with depo-medrol IM. She will take percocet for the pain.

## 2014-04-08 NOTE — Assessment & Plan Note (Signed)
She saw a rheumatologist (Dr. Ouida Sills) a few months ago and he told that he thinks she may have EN, will start colchicine to see if that with help with the pain and other features.

## 2014-04-08 NOTE — Assessment & Plan Note (Signed)
Her BP is well controlled 

## 2014-04-08 NOTE — Progress Notes (Signed)
   Subjective:    Patient ID: [name], female    DOB: 1966/01/15, 48 y.o.   MRN: [id number]  HPI  She returns complaining of several weeks of discoloration, swelling, pain, and nodules over her LE's (R>>L). She has not taken steroids in several months.  Review of Systems  Constitutional: Negative.  Negative for fever, chills, diaphoresis, appetite change and fatigue.  HENT: Negative.   Eyes: Negative.   Respiratory: Negative.  Negative for cough, choking, chest tightness, shortness of breath and stridor.   Cardiovascular: Negative.  Negative for chest pain, palpitations and leg swelling.  Gastrointestinal: Negative.  Negative for nausea, vomiting, abdominal pain, diarrhea, constipation and blood in stool.  Endocrine: Negative.   Genitourinary: Negative.   Musculoskeletal: Positive for myalgias and arthralgias. Negative for back pain and neck pain.  Skin: Positive for color change and rash. Negative for pallor and wound.  Allergic/Immunologic: Negative.   Neurological: Negative.   Hematological: Negative.  Negative for adenopathy. Does not bruise/bleed easily.  Psychiatric/Behavioral: Negative.        Objective:   Physical Exam  Constitutional: She is oriented to person, place, and time. She appears well-developed and well-nourished.  Non-toxic appearance. She does not have a sickly appearance. She does not appear ill. No distress.  HENT:  Head: Normocephalic and atraumatic.  Mouth/Throat: Oropharynx is clear and moist. No oropharyngeal exudate.  Eyes: Conjunctivae are normal. Right eye exhibits no discharge. Left eye exhibits no discharge. No scleral icterus.  Neck: Normal range of motion. Neck supple. No JVD present. No tracheal deviation present. No thyromegaly present.  Cardiovascular: Normal rate, regular rhythm, normal heart sounds and intact distal pulses.  Exam reveals no gallop and no friction rub.   No murmur heard. Pulmonary/Chest: Effort normal and breath  sounds normal. No stridor. No respiratory distress. She has no wheezes. She has no rales. She exhibits no tenderness.  Abdominal: Soft. Bowel sounds are normal. She exhibits no distension and no mass. There is no tenderness. There is no rebound and no guarding.  Musculoskeletal: Normal range of motion. She exhibits tenderness. She exhibits no edema.       Legs: Lymphadenopathy:    She has no cervical adenopathy.  Neurological: She is oriented to person, place, and time.  Skin: Skin is warm and dry. Rash noted. She is not diaphoretic. No erythema. No pallor.  Psychiatric: She has a normal mood and affect. Her behavior is normal. Judgment and thought content normal.     Lab Results  Component Value Date   WBC 11.5* 04/24/2013   HGB 12.9 04/24/2013   HCT 38.1 04/24/2013   PLT 325.0 04/24/2013   GLUCOSE 103* 04/24/2013   CHOL 140 09/24/2011   TRIG 80.0 09/24/2011   HDL 49.20 09/24/2011   LDLCALC 75 09/24/2011   ALT 35 04/24/2013   AST 23 04/24/2013   NA 135 04/24/2013   K 3.5 04/24/2013   CL 99 04/24/2013   CREATININE 1.0 04/24/2013   BUN 16 04/24/2013   CO2 26 04/24/2013   TSH 3.31 04/24/2013   INR 1.0 05/25/2007   HGBA1C 6.0 12/25/2010       Assessment & Plan:

## 2014-04-09 ENCOUNTER — Telehealth: Payer: Self-pay | Admitting: Internal Medicine

## 2014-04-09 NOTE — Telephone Encounter (Signed)
emmi emailed °

## 2014-05-18 ENCOUNTER — Ambulatory Visit: Payer: 59 | Admitting: Internal Medicine

## 2014-06-04 ENCOUNTER — Ambulatory Visit (INDEPENDENT_AMBULATORY_CARE_PROVIDER_SITE_OTHER): Payer: 59 | Admitting: Internal Medicine

## 2014-06-04 ENCOUNTER — Encounter: Payer: Self-pay | Admitting: Internal Medicine

## 2014-06-04 VITALS — BP 110/80 | HR 91 | Temp 98.4°F | Resp 16 | Ht 61.0 in | Wt 295.5 lb

## 2014-06-04 DIAGNOSIS — I10 Essential (primary) hypertension: Secondary | ICD-10-CM

## 2014-06-04 DIAGNOSIS — M300 Polyarteritis nodosa: Secondary | ICD-10-CM

## 2014-06-04 DIAGNOSIS — L52 Erythema nodosum: Secondary | ICD-10-CM

## 2014-06-04 NOTE — Patient Instructions (Signed)

## 2014-06-04 NOTE — Progress Notes (Signed)
Pre visit review using our clinic review tool, if applicable. No additional management support is needed unless otherwise documented below in the visit note. 

## 2014-06-05 DIAGNOSIS — Z0279 Encounter for issue of other medical certificate: Secondary | ICD-10-CM

## 2014-06-05 MED ORDER — METHYLPREDNISOLONE ACETATE 40 MG/ML IJ SUSP
120.0000 mg | Freq: Once | INTRAMUSCULAR | Status: AC
  Administered 2014-06-04: 120 mg via INTRAMUSCULAR

## 2014-06-05 NOTE — Assessment & Plan Note (Signed)
Will give her a course of steroids with depo-medrol IM I have encouraged to file for disability so that she can keep her LE's elevated Will cont percocet as needed

## 2014-06-05 NOTE — Progress Notes (Signed)
   Subjective:    Patient ID: [name], female    DOB: Jul 05, 1965, 49 y.o.   MRN: [id number]  HPI Comments: She complains of persistent pain and swelling in both LE's, she has a lot of pain when she is at work and sits for long periods, she feels like she is in too much pain to continue working. Colchicine has not helped much, she has not been seen at [facility] yet. She will not see any of the rheumatologists in [place] ("they don't help me"). Percocet helps that pain but she can't take it while she is working.     Review of Systems  Constitutional: Negative.  Negative for fever, chills, diaphoresis, appetite change and fatigue.  HENT: Negative.   Eyes: Negative.   Respiratory: Negative.  Negative for cough, choking, chest tightness, shortness of breath and stridor.   Cardiovascular: Negative.  Negative for chest pain, palpitations and leg swelling.  Gastrointestinal: Negative.  Negative for nausea, vomiting, abdominal pain, diarrhea, constipation and blood in stool.  Endocrine: Negative.   Genitourinary: Negative.   Musculoskeletal: Positive for myalgias and arthralgias. Negative for back pain, joint swelling, gait problem, neck pain and neck stiffness.       She has diffuse BLE pain, R>>L  Skin: Negative.  Negative for rash.  Allergic/Immunologic: Negative.   Hematological: Negative.  Negative for adenopathy. Does not bruise/bleed easily.  Psychiatric/Behavioral: Negative.        Objective:   Physical Exam  Constitutional: She is oriented to person, place, and time. She appears well-developed and well-nourished. No distress.  HENT:  Head: Normocephalic and atraumatic.  Mouth/Throat: Oropharynx is clear and moist. No oropharyngeal exudate.  Eyes: Conjunctivae are normal. Right eye exhibits no discharge. Left eye exhibits no discharge. No scleral icterus.  Neck: Normal range of motion. Neck supple. No JVD present. No tracheal deviation present. No thyromegaly present.    Cardiovascular: Normal rate, regular rhythm, normal heart sounds and intact distal pulses.  Exam reveals no gallop and no friction rub.   No murmur heard. Pulmonary/Chest: Effort normal and breath sounds normal. No stridor. No respiratory distress. She has no wheezes. She has no rales. She exhibits no tenderness.  Abdominal: Soft. Bowel sounds are normal. She exhibits no distension and no mass. There is no tenderness. There is no rebound and no guarding.  Musculoskeletal: Normal range of motion. She exhibits no edema or tenderness.       Legs: Lymphadenopathy:    She has no cervical adenopathy.  Neurological: She is oriented to person, place, and time.  Skin: Skin is warm and dry. No rash noted. She is not diaphoretic. No erythema. No pallor.  Vitals reviewed.     Lab Results  Component Value Date   WBC 11.5* 04/24/2013   HGB 12.9 04/24/2013   HCT 38.1 04/24/2013   PLT 325.0 04/24/2013   GLUCOSE 103* 04/24/2013   CHOL 140 09/24/2011   TRIG 80.0 09/24/2011   HDL 49.20 09/24/2011   LDLCALC 75 09/24/2011   ALT 35 04/24/2013   AST 23 04/24/2013   NA 135 04/24/2013   K 3.5 04/24/2013   CL 99 04/24/2013   CREATININE 1.0 04/24/2013   BUN 16 04/24/2013   CO2 26 04/24/2013   TSH 3.31 04/24/2013   INR 1.0 05/25/2007   HGBA1C 6.0 12/25/2010      Assessment & Plan:

## 2014-06-05 NOTE — Assessment & Plan Note (Signed)
Will give her an injection of depo-medrol to help with pain and swelling

## 2014-06-05 NOTE — Assessment & Plan Note (Signed)
Her BP is well controlled 

## 2014-07-04 ENCOUNTER — Telehealth: Payer: Self-pay | Admitting: Internal Medicine

## 2014-07-04 MED ORDER — LOSARTAN POTASSIUM-HCTZ 100-25 MG PO TABS: 1.0000 | ORAL_TABLET | Freq: Every day | ORAL | Status: DC

## 2014-07-04 NOTE — Telephone Encounter (Signed)
Pt called in need refill on her losartan-hydrochlorothiazide (HYZAAR) 100-25 MG per tablet [[id number]]   [facility]

## 2014-07-04 NOTE — Telephone Encounter (Signed)
[facility]...[facility]ss

## 2014-08-02 ENCOUNTER — Other Ambulatory Visit: Payer: Self-pay | Admitting: Obstetrics and Gynecology

## 2014-08-02 DIAGNOSIS — T8131XA Disruption of external operation (surgical) wound, not elsewhere classified, initial encounter: Secondary | ICD-10-CM

## 2014-08-06 ENCOUNTER — Other Ambulatory Visit: Payer: 59

## 2014-08-07 ENCOUNTER — Other Ambulatory Visit: Payer: 59

## 2014-08-08 ENCOUNTER — Ambulatory Visit
Admission: RE | Admit: 2014-08-08 | Discharge: 2014-08-08 | Disposition: A | Discharge status: Home / Self Care | Payer: 59 | Source: Ambulatory Visit | Attending: Obstetrics and Gynecology | Admitting: Obstetrics and Gynecology

## 2014-08-08 DIAGNOSIS — T8131XA Disruption of external operation (surgical) wound, not elsewhere classified, initial encounter: Secondary | ICD-10-CM

## 2014-08-08 MED ORDER — IOPAMIDOL (ISOVUE-300) INJECTION 61%
125.0000 mL | Freq: Once | INTRAVENOUS | Status: AC | PRN
  Administered 2014-08-08: 125 mL via INTRAVENOUS

## 2014-08-11 ENCOUNTER — Encounter (HOSPITAL_BASED_OUTPATIENT_CLINIC_OR_DEPARTMENT_OTHER): Payer: Self-pay

## 2014-08-11 ENCOUNTER — Emergency Department (HOSPITAL_BASED_OUTPATIENT_CLINIC_OR_DEPARTMENT_OTHER)
Admission: EM | Admit: 2014-08-11 | Discharge: 2014-08-11 | Disposition: A | Discharge status: Home / Self Care | Payer: 59 | Attending: Emergency Medicine | Admitting: Emergency Medicine

## 2014-08-11 DIAGNOSIS — Z86010 Personal history of colonic polyps: Secondary | ICD-10-CM | POA: Diagnosis not present

## 2014-08-11 DIAGNOSIS — Y92002 Bathroom of unspecified non-institutional (private) residence single-family (private) house as the place of occurrence of the external cause: Secondary | ICD-10-CM | POA: Diagnosis not present

## 2014-08-11 DIAGNOSIS — Z79899 Other long term (current) drug therapy: Secondary | ICD-10-CM | POA: Insufficient documentation

## 2014-08-11 DIAGNOSIS — E669 Obesity, unspecified: Secondary | ICD-10-CM | POA: Diagnosis not present

## 2014-08-11 DIAGNOSIS — Z8673 Personal history of transient ischemic attack (TIA), and cerebral infarction without residual deficits: Secondary | ICD-10-CM | POA: Diagnosis not present

## 2014-08-11 DIAGNOSIS — S39012A Strain of muscle, fascia and tendon of lower back, initial encounter: Secondary | ICD-10-CM | POA: Insufficient documentation

## 2014-08-11 DIAGNOSIS — Y998 Other external cause status: Secondary | ICD-10-CM | POA: Diagnosis not present

## 2014-08-11 DIAGNOSIS — Y9389 Activity, other specified: Secondary | ICD-10-CM | POA: Diagnosis not present

## 2014-08-11 DIAGNOSIS — Z8739 Personal history of other diseases of the musculoskeletal system and connective tissue: Secondary | ICD-10-CM | POA: Insufficient documentation

## 2014-08-11 DIAGNOSIS — S3992XA Unspecified injury of lower back, initial encounter: Secondary | ICD-10-CM | POA: Diagnosis present

## 2014-08-11 DIAGNOSIS — I10 Essential (primary) hypertension: Secondary | ICD-10-CM | POA: Diagnosis not present

## 2014-08-11 DIAGNOSIS — X58XXXA Exposure to other specified factors, initial encounter: Secondary | ICD-10-CM | POA: Insufficient documentation

## 2014-08-11 MED ORDER — DEXAMETHASONE SODIUM PHOSPHATE 10 MG/ML IJ SOLN
10.0000 mg | Freq: Once | INTRAMUSCULAR | Status: AC
  Administered 2014-08-11: 10 mg via INTRAMUSCULAR
  Filled 2014-08-11: qty 1

## 2014-08-11 MED ORDER — PREDNISONE 20 MG PO TABS: ORAL_TABLET | ORAL | Status: DC

## 2014-08-11 MED ORDER — HYDROMORPHONE HCL 1 MG/ML IJ SOLN
1.0000 mg | Freq: Once | INTRAMUSCULAR | Status: AC
  Administered 2014-08-11: 1 mg via INTRAMUSCULAR
  Filled 2014-08-11: qty 1

## 2014-08-11 MED ORDER — OXYCODONE-ACETAMINOPHEN 5-325 MG PO TABS: 1.0000 | ORAL_TABLET | ORAL | Status: DC | PRN

## 2014-08-11 MED ORDER — METHOCARBAMOL 500 MG PO TABS: 500.0000 mg | ORAL_TABLET | Freq: Two times a day (BID) | ORAL | Status: DC

## 2014-08-11 MED ORDER — ONDANSETRON 8 MG PO TBDP
8.0000 mg | ORAL_TABLET | Freq: Once | ORAL | Status: AC
  Administered 2014-08-11: 8 mg via ORAL
  Filled 2014-08-11: qty 1

## 2014-08-11 NOTE — Discharge Instructions (Signed)

## 2014-08-11 NOTE — ED Provider Notes (Signed)
CSN: [id number]     Arrival date & time 08/11/14  1026 History   First MD Initiated Contact with Patient 08/11/14 1038     Chief Complaint  Patient presents with  . Back Pain     (Consider location/radiation/quality/duration/timing/severity/associated sxs/prior Treatment) HPI Comments: Patient presents to the ER for evaluation of low back pain. Patient reports sudden onset of low back pain yesterday when she got up off the toilet. She reports that she turned slightly and felt pain in the lower back. Since then she has been having severe spasms of the lower back. Pain is constant. She reports that it does radiate to the hips bilaterally, no loss of strength or sensation in the lower 20s, no numbness or tingling. No change of bowel or bladder function. She reports a history of herniated disc treated conservatively several years ago, no residual problems.  Patient is a 49 y.o. female presenting with back pain.  Back Pain   Past Medical History  Diagnosis Date  . History of colonic polyps   . Headache(784.0)   . Hypertension   . Osteoarthritis   . TIA (transient ischemic attack) 2007    hx of  . Obesity   . Vasculitis     L leg - Polyarthritis  . Herniated disc    Past Surgical History  Procedure Laterality Date  . Abdominal hysterectomy     Family History  Problem Relation Age of Onset  . Arthritis Other   . Hypertension Other   . Stroke Other   . Stroke Mother   . Hypertension Mother   . Hypertension Father   . Cancer Father     Prostate   History  Substance Use Topics  . Smoking status: Never Smoker   . Smokeless tobacco: Never Used  . Alcohol Use: No     Comment: occasionally   OB History    No data available     Review of Systems  Musculoskeletal: Positive for back pain.  All other systems reviewed and are negative.     Allergies  Review of patient's allergies indicates no known allergies.  Home Medications   Prior to Admission medications     Medication Sig Start Date End Date Taking? Authorizing Provider  losartan-hydrochlorothiazide (HYZAAR) 100-25 MG per tablet Take 1 tablet by mouth daily. 07/04/14  Yes Janith Lima, MD   BP 132/88 mmHg  Pulse 110  Temp(Src) 98.4 F (36.9 C) (Oral)  Resp 18  Ht 5\' 1"  (1.549 m)  Wt 288 lb (130.636 kg)  BMI 54.45 kg/m2  SpO2 100% Physical Exam  Constitutional: She is oriented to person, place, and time. She appears well-developed and well-nourished. No distress.  HENT:  Head: Normocephalic and atraumatic.  Right Ear: Hearing normal.  Left Ear: Hearing normal.  Nose: Nose normal.  Mouth/Throat: Oropharynx is clear and moist and mucous membranes are normal.  Eyes: Conjunctivae and EOM are normal. Pupils are equal, round, and reactive to light.  Neck: Normal range of motion. Neck supple.  Cardiovascular: Regular rhythm, S1 normal and S2 normal.  Exam reveals no gallop and no friction rub.   No murmur heard. Pulmonary/Chest: Effort normal and breath sounds normal. No respiratory distress. She exhibits no tenderness.  Abdominal: Soft. Normal appearance and bowel sounds are normal. There is no hepatosplenomegaly. There is no tenderness. There is no rebound, no guarding, no tenderness at McBurney's point and negative Murphy's sign. No hernia.  Musculoskeletal: Normal range of motion.  Lumbar back: She exhibits tenderness.       Back:  Neurological: She is alert and oriented to person, place, and time. She has normal strength. No cranial nerve deficit or sensory deficit. Coordination normal. GCS eye subscore is 4. GCS verbal subscore is 5. GCS motor subscore is 6.  Skin: Skin is warm, dry and intact. No rash noted. No cyanosis.  Psychiatric: She has a normal mood and affect. Her speech is normal and behavior is normal. Thought content normal.  Nursing note and vitals reviewed.   ED Course  Procedures (including critical care time) Labs Review Labs Reviewed - No data to  display  Imaging Review No results found.   EKG Interpretation None      MDM   Final diagnoses:  None   lumbar pain  Patient presents to the ER with musculoskeletal back pain. Examination reveals back tenderness without any associated neurologic findings. Patient's strength, sensation and reflexes were normal. As such, patient did not require any imaging or further studies. Patient was treated with analgesia.    Orpah Greek, MD 08/11/14 1051

## 2014-08-11 NOTE — ED Notes (Signed)
Pt repositioned, states pain med has improved back pain a lot, resting more quietly at this time

## 2014-08-11 NOTE — ED Notes (Signed)
Patient here with lower back pain since using the rest room yesterday. Developed the pain after getting up off toilet, thinks she may have twisted wrong. Pain down both legs with any movement

## 2014-08-11 NOTE — ED Notes (Signed)
Son here to drive pt home

## 2014-08-11 NOTE — ED Notes (Signed)
Presents to ED w/ lower back pain, states has had prev MRI, in today w/ exacerbation of back pain. MD at bedside for further evaluation and assessment

## 2014-08-11 NOTE — ED Notes (Signed)
MD at bedside. 

## 2014-09-05 ENCOUNTER — Encounter: Payer: Self-pay | Admitting: Internal Medicine

## 2014-09-05 ENCOUNTER — Ambulatory Visit (INDEPENDENT_AMBULATORY_CARE_PROVIDER_SITE_OTHER): Payer: 59 | Admitting: Internal Medicine

## 2014-09-05 ENCOUNTER — Other Ambulatory Visit: Payer: Self-pay | Admitting: Internal Medicine

## 2014-09-05 VITALS — BP 102/68 | HR 68 | Temp 98.7°F | Resp 16 | Ht 61.0 in | Wt 296.0 lb

## 2014-09-05 DIAGNOSIS — L308 Other specified dermatitis: Secondary | ICD-10-CM | POA: Insufficient documentation

## 2014-09-05 DIAGNOSIS — I10 Essential (primary) hypertension: Secondary | ICD-10-CM | POA: Diagnosis not present

## 2014-09-05 DIAGNOSIS — R21 Rash and other nonspecific skin eruption: Secondary | ICD-10-CM | POA: Insufficient documentation

## 2014-09-05 DIAGNOSIS — L309 Dermatitis, unspecified: Secondary | ICD-10-CM | POA: Diagnosis not present

## 2014-09-05 MED ORDER — CETIRIZINE HCL 10 MG PO TABS: 10.0000 mg | ORAL_TABLET | Freq: Every day | ORAL | Status: DC

## 2014-09-05 MED ORDER — CLOBETASOL PROPIONATE 0.05 % EX OINT: 1.0000 "application " | TOPICAL_OINTMENT | Freq: Two times a day (BID) | CUTANEOUS | Status: DC

## 2014-09-05 NOTE — Progress Notes (Signed)
Subjective:  Patient ID: [name], female    DOB: October 06, 1965  Age: 49 y.o. MRN: [id number]  CC: Rash   HPI [name] presents for evaluation of a rash that has developed over the last 3-4 weeks, there is darkening, itching, and some scaliness over both LE's, more on the right than the left. She has not treated this in any way.  Outpatient Prescriptions Prior to Visit  Medication Sig Dispense Refill  . losartan-hydrochlorothiazide (HYZAAR) 100-25 MG per tablet Take 1 tablet by mouth daily. 90 tablet 3  . methocarbamol (ROBAXIN) 500 MG tablet Take 1 tablet (500 mg total) by mouth 2 (two) times daily. (Patient not taking: Reported on 09/05/2014) 20 tablet 0  . oxyCODONE-acetaminophen (PERCOCET) 5-325 MG per tablet Take 1-2 tablets by mouth every 4 (four) hours as needed. (Patient not taking: Reported on 09/05/2014) 20 tablet 0  . predniSONE (DELTASONE) 20 MG tablet 3 tabs po daily x 3 days, then 2 tabs x 3 days, then 1.5 tabs x 3 days, then 1 tab x 3 days, then 0.5 tabs x 3 days (Patient not taking: Reported on 09/05/2014) 27 tablet 0   No facility-administered medications prior to visit.    ROS Review of Systems  Constitutional: Negative.  Negative for fever, chills, diaphoresis, appetite change and fatigue.  HENT: Negative.   Eyes: Negative.   Respiratory: Negative.  Negative for cough, choking, chest tightness, shortness of breath and stridor.   Cardiovascular: Negative.  Negative for chest pain, palpitations and leg swelling.  Gastrointestinal: Negative.  Negative for nausea, vomiting, abdominal pain, diarrhea, constipation and blood in stool.  Endocrine: Negative.   Genitourinary: Negative.   Musculoskeletal: Negative.  Negative for myalgias, back pain, joint swelling and arthralgias.  Skin: Positive for rash. Negative for color change, pallor and wound.  Allergic/Immunologic: Negative.   Neurological: Negative.   Hematological: Negative.  Negative for adenopathy.  Does not bruise/bleed easily.  Psychiatric/Behavioral: Negative.     Objective:  BP 102/68 mmHg  Pulse 68  Temp(Src) 98.7 F (37.1 C) (Oral)  Resp 16  Ht 5\' 1"  (1.549 m)  Wt 296 lb (134.265 kg)  BMI 55.96 kg/m2  SpO2 98%  BP Readings from Last 3 Encounters:  09/05/14 102/68  08/11/14 132/88  06/04/14 110/80    Wt Readings from Last 3 Encounters:  09/05/14 296 lb (134.265 kg)  08/11/14 288 lb (130.636 kg)  06/04/14 295 lb 8 oz (134.038 kg)    Physical Exam  Constitutional: She is oriented to person, place, and time. She appears well-developed and well-nourished. No distress.  HENT:  Head: Normocephalic and atraumatic.  Mouth/Throat: Oropharynx is clear and moist. No oropharyngeal exudate.  Eyes: Conjunctivae are normal. Right eye exhibits no discharge. Left eye exhibits no discharge. No scleral icterus.  Neck: Normal range of motion. Neck supple. No JVD present. No tracheal deviation present. No thyromegaly present.  Cardiovascular: Normal rate, regular rhythm, normal heart sounds and intact distal pulses.  Exam reveals no gallop and no friction rub.   No murmur heard. Pulmonary/Chest: Effort normal and breath sounds normal. No stridor. No respiratory distress. She has no wheezes. She has no rales. She exhibits no tenderness.  Abdominal: Soft. Bowel sounds are normal. She exhibits no distension and no mass. There is no tenderness. There is no rebound and no guarding.  Musculoskeletal: Normal range of motion. She exhibits no edema or tenderness.  Lymphadenopathy:    She has no cervical adenopathy.  Neurological: She is oriented to  person, place, and time.  Skin: Skin is warm, dry and intact. Rash noted. No purpura noted. Rash is macular. Rash is not papular, not maculopapular, not nodular, not pustular, not vesicular and not urticarial. She is not diaphoretic. No pallor.     bx note - over the RLE the area above the medial malleolus was cleaned with betadine then local  anesthesia was obtained with 1% lido with epi, 1.5 cc were used, then a 4 mm punch incision was made and a biopsy specimen was collected. 1 simple suture (4.0 nylon) was placed to close the wound. She tolerated this well with no complications. Antibiotic ointment and a dressing was applied. There was no blood loss.  Psychiatric: She has a normal mood and affect. Her behavior is normal. Judgment and thought content normal.  Vitals reviewed.   Lab Results  Component Value Date   WBC 11.5* 04/24/2013   HGB 12.9 04/24/2013   HCT 38.1 04/24/2013   PLT 325.0 04/24/2013   GLUCOSE 103* 04/24/2013   CHOL 140 09/24/2011   TRIG 80.0 09/24/2011   HDL 49.20 09/24/2011   LDLCALC 75 09/24/2011   ALT 35 04/24/2013   AST 23 04/24/2013   NA 135 04/24/2013   K 3.5 04/24/2013   CL 99 04/24/2013   CREATININE 1.0 04/24/2013   BUN 16 04/24/2013   CO2 26 04/24/2013   TSH 3.31 04/24/2013   INR 1.0 05/25/2007   HGBA1C 6.0 12/25/2010    No results found.  Assessment & Plan:   Jordis was seen today for rash.  Diagnoses and all orders for this visit:  Essential hypertension, benign - her BP is well controlled  Dermatitis, asteototic Orders: -     cetirizine (ZYRTEC) 10 MG tablet; Take 1 tablet (10 mg total) by mouth daily. -     clobetasol ointment (TEMOVATE) 0.05 %; Apply 1 application topically 2 (two) times daily. -     Dermatology pathology  Rash and nonspecific skin eruption - biopsy was done to screen for tinea, vasculitis, etc. For now will treat for eczema. She will RTC in one week for a recheck and suture removal. Orders: -     cetirizine (ZYRTEC) 10 MG tablet; Take 1 tablet (10 mg total) by mouth daily. -     clobetasol ointment (TEMOVATE) 0.05 %; Apply 1 application topically 2 (two) times daily. -     Dermatology pathology   I have discontinued [name]'s predniSONE, methocarbamol, and oxyCODONE-acetaminophen. I am also having her start on cetirizine and clobetasol ointment.  Additionally, I am having her maintain her losartan-hydrochlorothiazide.  Meds ordered this encounter  Medications  . cetirizine (ZYRTEC) 10 MG tablet    Sig: Take 1 tablet (10 mg total) by mouth daily.    Dispense:  30 tablet    Refill:  11  . clobetasol ointment (TEMOVATE) 0.05 %    Sig: Apply 1 application topically 2 (two) times daily.    Dispense:  45 g    Refill:  1     Follow-up: Return in about 1 week (around 09/12/2014).  Scarlette Calico, MD

## 2014-09-05 NOTE — Patient Instructions (Signed)

## 2014-09-06 ENCOUNTER — Telehealth: Payer: Self-pay | Admitting: Internal Medicine

## 2014-09-06 NOTE — Telephone Encounter (Signed)
[name] Free from [facility] [phone]) received a biopsy today and it is not stated where this piece came from. I tried to find out the information for her in patients chart but was unable to see it. Can you call her and see if you can find the information.

## 2014-09-07 NOTE — Telephone Encounter (Signed)
Returned call to [place], [facility] advising source is from patient lower, right leg.

## 2014-09-13 ENCOUNTER — Other Ambulatory Visit (INDEPENDENT_AMBULATORY_CARE_PROVIDER_SITE_OTHER): Payer: 59

## 2014-09-13 ENCOUNTER — Ambulatory Visit (INDEPENDENT_AMBULATORY_CARE_PROVIDER_SITE_OTHER): Payer: 59 | Admitting: Internal Medicine

## 2014-09-13 ENCOUNTER — Encounter: Payer: Self-pay | Admitting: Internal Medicine

## 2014-09-13 VITALS — BP 116/76 | HR 89 | Temp 97.8°F | Resp 16 | Ht 61.0 in | Wt 296.0 lb

## 2014-09-13 DIAGNOSIS — E1169 Type 2 diabetes mellitus with other specified complication: Secondary | ICD-10-CM | POA: Insufficient documentation

## 2014-09-13 DIAGNOSIS — E876 Hypokalemia: Secondary | ICD-10-CM

## 2014-09-13 DIAGNOSIS — R739 Hyperglycemia, unspecified: Secondary | ICD-10-CM

## 2014-09-13 DIAGNOSIS — E785 Hyperlipidemia, unspecified: Secondary | ICD-10-CM

## 2014-09-13 DIAGNOSIS — R748 Abnormal levels of other serum enzymes: Secondary | ICD-10-CM

## 2014-09-13 DIAGNOSIS — E1165 Type 2 diabetes mellitus with hyperglycemia: Secondary | ICD-10-CM | POA: Insufficient documentation

## 2014-09-13 DIAGNOSIS — I10 Essential (primary) hypertension: Secondary | ICD-10-CM

## 2014-09-13 DIAGNOSIS — R21 Rash and other nonspecific skin eruption: Secondary | ICD-10-CM | POA: Diagnosis not present

## 2014-09-13 DIAGNOSIS — E118 Type 2 diabetes mellitus with unspecified complications: Secondary | ICD-10-CM

## 2014-09-13 DIAGNOSIS — E119 Type 2 diabetes mellitus without complications: Secondary | ICD-10-CM | POA: Insufficient documentation

## 2014-09-13 LAB — LIPID PANEL
Cholesterol: 172 mg/dL (ref 0–200)
HDL: 42.3 mg/dL (ref >39.00)
LDL Cholesterol: 106 mg/dL — ABNORMAL HIGH (ref 0–99)
NonHDL: 129.7
Total CHOL/HDL Ratio: 4
Triglycerides: 120 mg/dL (ref 0.0–149.0)
VLDL: 24 mg/dL (ref 0.0–40.0)

## 2014-09-13 LAB — CBC WITH DIFFERENTIAL/PLATELET
Basophils Absolute: 0.1 10*3/uL (ref 0.0–0.1)
Basophils Relative: 0.6 % (ref 0.0–3.0)
EOS PCT: 4.4 % (ref 0.0–5.0)
Eosinophils Absolute: 0.4 10*3/uL (ref 0.0–0.7)
HCT: 36.3 % (ref 36.0–46.0)
Hemoglobin: 11.9 g/dL — ABNORMAL LOW (ref 12.0–15.0)
LYMPHS PCT: 27.3 % (ref 12.0–46.0)
Lymphs Abs: 2.3 10*3/uL (ref 0.7–4.0)
MCHC: 32.9 g/dL (ref 30.0–36.0)
MCV: 81.8 fl (ref 78.0–100.0)
MONOS PCT: 6.5 % (ref 3.0–12.0)
Monocytes Absolute: 0.5 10*3/uL (ref 0.1–1.0)
NEUTROS PCT: 61.2 % (ref 43.0–77.0)
Neutro Abs: 5.1 10*3/uL (ref 1.4–7.7)
Platelets: 413 10*3/uL — ABNORMAL HIGH (ref 150.0–400.0)
RBC: 4.43 Mil/uL (ref 3.87–5.11)
RDW: 15.6 % — ABNORMAL HIGH (ref 11.5–15.5)
WBC: 8.4 10*3/uL (ref 4.0–10.5)

## 2014-09-13 LAB — COMPREHENSIVE METABOLIC PANEL
ALK PHOS: 210 U/L — AB (ref 39–117)
ALT: 15 U/L (ref 0–35)
AST: 17 U/L (ref 0–37)
Albumin: 4 g/dL (ref 3.5–5.2)
BILIRUBIN TOTAL: 0.3 mg/dL (ref 0.2–1.2)
BUN: 12 mg/dL (ref 6–23)
CO2: 29 mEq/L (ref 19–32)
Calcium: 9.5 mg/dL (ref 8.4–10.5)
Chloride: 99 mEq/L (ref 96–112)
Creatinine, Ser: 0.98 mg/dL (ref 0.40–1.20)
GFR: 78.03 mL/min (ref >60.00)
Glucose, Bld: 111 mg/dL — ABNORMAL HIGH (ref 70–99)
POTASSIUM: 3.4 meq/L — AB (ref 3.5–5.1)
SODIUM: 135 meq/L (ref 135–145)
TOTAL PROTEIN: 7.9 g/dL (ref 6.0–8.3)

## 2014-09-13 LAB — HEMOGLOBIN A1C: Hgb A1c MFr Bld: 6.8 % — ABNORMAL HIGH (ref 4.6–6.5)

## 2014-09-13 LAB — SEDIMENTATION RATE: Sed Rate: 88 mm/hr — ABNORMAL HIGH (ref 0–22)

## 2014-09-13 LAB — TSH: TSH: 2.46 u[IU]/mL (ref 0.35–4.50)

## 2014-09-13 MED ORDER — NEBIVOLOL HCL 5 MG PO TABS: 5.0000 mg | ORAL_TABLET | Freq: Every day | ORAL | Status: DC

## 2014-09-13 NOTE — Patient Instructions (Signed)

## 2014-09-16 NOTE — Progress Notes (Signed)
Subjective:  Patient ID: [name], female    DOB: 12/06/1965  Age: 49 y.o. MRN: [id number]  CC: Rash and Hypertension   HPI [name] presents for follow up on  Itchy rash, s/p skin biopsy done on the RLE. The rash is unchanged, she has not started treating it. The biopsy result shows concern for a  Fixed drug eruption.  Outpatient Prescriptions Prior to Visit  Medication Sig Dispense Refill  . cetirizine (ZYRTEC) 10 MG tablet Take 1 tablet (10 mg total) by mouth daily. 30 tablet 11  . clobetasol ointment (TEMOVATE) 5.36 % Apply 1 application topically 2 (two) times daily. 45 g 1  . losartan-hydrochlorothiazide (HYZAAR) 100-25 MG per tablet Take 1 tablet by mouth daily. 90 tablet 3   No facility-administered medications prior to visit.    ROS Review of Systems  Constitutional: Negative.  Negative for fever, chills, diaphoresis, appetite change and fatigue.  HENT: Negative.   Eyes: Negative.   Respiratory: Negative.  Negative for cough, choking, chest tightness, shortness of breath and stridor.   Cardiovascular: Negative.  Negative for chest pain, palpitations and leg swelling.  Gastrointestinal: Negative.  Negative for nausea, vomiting, abdominal pain, diarrhea, constipation and blood in stool.  Endocrine: Negative.  Negative for polydipsia, polyphagia and polyuria.  Genitourinary: Negative.   Musculoskeletal: Negative.  Negative for myalgias, back pain, joint swelling and arthralgias.  Skin: Positive for rash.  Allergic/Immunologic: Negative.   Neurological: Negative.   Hematological: Negative.  Negative for adenopathy. Does not bruise/bleed easily.  Psychiatric/Behavioral: Negative.     Objective:  BP 116/76 mmHg  Pulse 89  Temp(Src) 97.8 F (36.6 C) (Oral)  Resp 16  Ht 5\' 1"  (1.549 m)  Wt 296 lb (134.265 kg)  BMI 55.96 kg/m2  SpO2 99%  BP Readings from Last 3 Encounters:  09/13/14 116/76  09/05/14 102/68  08/11/14 132/88    Wt Readings from  Last 3 Encounters:  09/13/14 296 lb (134.265 kg)  09/05/14 296 lb (134.265 kg)  08/11/14 288 lb (130.636 kg)    Physical Exam  Constitutional: She is oriented to person, place, and time. She appears well-developed and well-nourished. No distress.  HENT:  Head: Normocephalic and atraumatic.  Mouth/Throat: Oropharynx is clear and moist. No oropharyngeal exudate.  Eyes: Right eye exhibits no discharge. Left eye exhibits no discharge. No scleral icterus.  Neck: Normal range of motion. Neck supple. No JVD present. No tracheal deviation present. No thyromegaly present.  Cardiovascular: Normal rate, regular rhythm, normal heart sounds and intact distal pulses.  Exam reveals no gallop and no friction rub.   No murmur heard. Pulmonary/Chest: Effort normal and breath sounds normal. No stridor. No respiratory distress. She has no wheezes. She has no rales. She exhibits no tenderness.  Abdominal: Soft. Bowel sounds are normal. She exhibits no distension and no mass. There is no tenderness. There is no rebound and no guarding.  Musculoskeletal: Normal range of motion. She exhibits no edema or tenderness.  Lymphadenopathy:    She has no cervical adenopathy.  Neurological: She is oriented to person, place, and time.  Skin: Skin is warm and dry. Rash noted. Rash is macular. She is not diaphoretic. No erythema. No pallor.     Psychiatric: She has a normal mood and affect. Her behavior is normal. Judgment and thought content normal.  Vitals reviewed.  Biopsy result- There is a superficial and deep perivascular and interstitial infiltrate of lymphocytes, histiocytes and eosinophils. There is spongiosis of the epidermis with  some scale. This pattern suggests a hypersensitivity reaction and can be seen in an arthropod bite reaction or in a systemic hypersensitivity reaction such as a drug reaction. There are superimposed changes of lichen simplex chronicus  Lab Results  Component Value Date   WBC 8.4  09/13/2014   HGB 11.9* 09/13/2014   HCT 36.3 09/13/2014   PLT 413.0* 09/13/2014   GLUCOSE 111* 09/13/2014   CHOL 172 09/13/2014   TRIG 120.0 09/13/2014   HDL 42.30 09/13/2014   LDLCALC 106* 09/13/2014   ALT 15 09/13/2014   AST 17 09/13/2014   NA 135 09/13/2014   K 3.4* 09/13/2014   CL 99 09/13/2014   CREATININE 0.98 09/13/2014   BUN 12 09/13/2014   CO2 29 09/13/2014   TSH 2.46 09/13/2014   INR 1.0 05/25/2007   HGBA1C 6.8* 09/13/2014    No results found.  Assessment & Plan:   Spruha was seen today for rash and hypertension.  Diagnoses and all orders for this visit:  Rash and nonspecific skin eruption - this may be FDE, will stop the ARB and HCTZ since either could be causing this, she agrees to start the meds previously prescribed Orders: -     CBC with Differential/Platelet; Future -     Sedimentation rate; Future  Essential hypertension, benign - will stop the ARB and HCTZ due to the rash, will start Bystolic for BP control Orders: -     Comprehensive metabolic panel; Future -     nebivolol (BYSTOLIC) 5 MG tablet; Take 1 tablet (5 mg total) by mouth daily.  Hypokalemia - stable  Alkaline phosphatase elevation  Hyperglycemia Orders: -     Comprehensive metabolic panel; Future -     Hemoglobin A1c; Future  Hyperlipidemia with target LDL less than 130 Orders: -     Lipid panel; Future -     Comprehensive metabolic panel; Future -     TSH; Future  Type II diabetes mellitus with manifestations - this is a new dx today, she will work on her lifestyle modifications   I have discontinued [name]'s losartan-hydrochlorothiazide. I am also having her start on nebivolol. Additionally, I am having her maintain her cetirizine and clobetasol ointment.  Meds ordered this encounter  Medications  . nebivolol (BYSTOLIC) 5 MG tablet    Sig: Take 1 tablet (5 mg total) by mouth daily.    Dispense:  35 tablet    Refill:  0     Follow-up: Return in about 4 weeks  (around 10/11/2014).  Scarlette Calico, MD

## 2014-09-20 ENCOUNTER — Other Ambulatory Visit: Payer: Self-pay | Admitting: Internal Medicine

## 2014-09-20 DIAGNOSIS — I10 Essential (primary) hypertension: Secondary | ICD-10-CM

## 2014-09-20 MED ORDER — INDAPAMIDE 2.5 MG PO TABS
2.5000 mg | ORAL_TABLET | Freq: Every day | ORAL | Status: DC
Start: 2014-09-20 — End: 2014-10-11

## 2014-10-11 ENCOUNTER — Ambulatory Visit (INDEPENDENT_AMBULATORY_CARE_PROVIDER_SITE_OTHER): Payer: 59 | Admitting: Internal Medicine

## 2014-10-11 ENCOUNTER — Encounter: Payer: Self-pay | Admitting: Internal Medicine

## 2014-10-11 VITALS — BP 112/76 | HR 76 | Temp 98.4°F | Resp 16 | Ht 61.0 in | Wt 293.0 lb

## 2014-10-11 DIAGNOSIS — R21 Rash and other nonspecific skin eruption: Secondary | ICD-10-CM | POA: Diagnosis not present

## 2014-10-11 DIAGNOSIS — L308 Other specified dermatitis: Secondary | ICD-10-CM | POA: Diagnosis not present

## 2014-10-11 DIAGNOSIS — L309 Dermatitis, unspecified: Secondary | ICD-10-CM

## 2014-10-11 DIAGNOSIS — I10 Essential (primary) hypertension: Secondary | ICD-10-CM | POA: Diagnosis not present

## 2014-10-11 MED ORDER — POTASSIUM CHLORIDE CRYS ER 20 MEQ PO TBCR: 20.0000 meq | EXTENDED_RELEASE_TABLET | Freq: Every day | ORAL | Status: DC

## 2014-10-11 MED ORDER — CLOBETASOL PROPIONATE 0.05 % EX OINT: 1.0000 "application " | TOPICAL_OINTMENT | Freq: Two times a day (BID) | CUTANEOUS | Status: DC

## 2014-10-11 MED ORDER — INDAPAMIDE 2.5 MG PO TABS: 2.5000 mg | ORAL_TABLET | Freq: Every day | ORAL | Status: DC

## 2014-10-11 MED ORDER — NEBIVOLOL HCL 5 MG PO TABS: 5.0000 mg | ORAL_TABLET | Freq: Every day | ORAL | Status: DC

## 2014-10-11 MED ORDER — CETIRIZINE HCL 10 MG PO TABS: 10.0000 mg | ORAL_TABLET | Freq: Every day | ORAL | Status: DC

## 2014-10-11 NOTE — Progress Notes (Signed)
Pre visit review using our clinic review tool, if applicable. No additional management support is needed unless otherwise documented below in the visit note. 

## 2014-10-11 NOTE — Progress Notes (Signed)
Subjective:  Patient ID: [name], female    DOB: Aug 29, 1965  Age: 49 y.o. MRN: [id number]  CC: Hypertension   HPI [name] presents for a BP check - the rash on her legs has improved. Her BP has been well controlled and she feels well today.  Outpatient Prescriptions Prior to Visit  Medication Sig Dispense Refill  . cetirizine (ZYRTEC) 10 MG tablet Take 1 tablet (10 mg total) by mouth daily. (Patient not taking: Reported on 10/11/2014) 30 tablet 11  . clobetasol ointment (TEMOVATE) 8.24 % Apply 1 application topically 2 (two) times daily. (Patient not taking: Reported on 10/11/2014) 45 g 1  . indapamide (LOZOL) 2.5 MG tablet Take 1 tablet (2.5 mg total) by mouth daily. (Patient not taking: Reported on 10/11/2014) 90 tablet 1  . nebivolol (BYSTOLIC) 5 MG tablet Take 1 tablet (5 mg total) by mouth daily. (Patient not taking: Reported on 10/11/2014) 35 tablet 0   No facility-administered medications prior to visit.    ROS Review of Systems  Constitutional: Negative.  Negative for fever, chills, diaphoresis, appetite change and fatigue.  HENT: Negative.   Eyes: Negative.   Respiratory: Negative.  Negative for cough, choking, chest tightness, shortness of breath and stridor.   Cardiovascular: Negative.  Negative for chest pain, palpitations and leg swelling.  Gastrointestinal: Negative.  Negative for nausea, vomiting, abdominal pain, diarrhea, constipation and blood in stool.  Endocrine: Negative.   Genitourinary: Negative.   Musculoskeletal: Negative.   Skin: Positive for rash.  Allergic/Immunologic: Negative.   Neurological: Negative.  Negative for dizziness, tremors, weakness, light-headedness and numbness.  Hematological: Negative.  Negative for adenopathy. Does not bruise/bleed easily.  Psychiatric/Behavioral: Negative.     Objective:  BP 112/76 mmHg  Pulse 76  Temp(Src) 98.4 F (36.9 C) (Oral)  Ht 5\' 1"  (1.549 m)  Wt 293 lb (132.904 kg)  BMI 55.39 kg/m2   SpO2 98%  BP Readings from Last 3 Encounters:  10/11/14 112/76  09/13/14 116/76  09/05/14 102/68    Wt Readings from Last 3 Encounters:  10/11/14 293 lb (132.904 kg)  09/13/14 296 lb (134.265 kg)  09/05/14 296 lb (134.265 kg)    Physical Exam  Constitutional: She is oriented to person, place, and time. She appears well-developed and well-nourished. No distress.  HENT:  Head: Normocephalic and atraumatic.  Nose: Nose normal.  Mouth/Throat: Oropharynx is clear and moist. No oropharyngeal exudate.  Eyes: Conjunctivae are normal. Right eye exhibits no discharge. Left eye exhibits no discharge. No scleral icterus.  Neck: Normal range of motion. Neck supple. No JVD present. No tracheal deviation present. No thyromegaly present.  Cardiovascular: Normal rate, regular rhythm, normal heart sounds and intact distal pulses.  Exam reveals no gallop and no friction rub.   No murmur heard. Pulmonary/Chest: Effort normal and breath sounds normal. No stridor. No respiratory distress. She has no wheezes. She has no rales. She exhibits no tenderness.  Abdominal: Soft. Bowel sounds are normal. She exhibits no distension and no mass. There is no tenderness. There is no rebound and no guarding.  Musculoskeletal: Normal range of motion. She exhibits no edema or tenderness.  Lymphadenopathy:    She has no cervical adenopathy.  Neurological: She is oriented to person, place, and time.  Skin: Skin is warm and dry. Rash noted. No purpura noted. Rash is macular. Rash is not papular, not maculopapular, not nodular, not pustular, not vesicular and not urticarial. She is not diaphoretic. No erythema. No pallor.  There is  faint, diffuse hyperpigmentation over B anterior LE.  Vitals reviewed.   Lab Results  Component Value Date   WBC 8.4 09/13/2014   HGB 11.9* 09/13/2014   HCT 36.3 09/13/2014   PLT 413.0* 09/13/2014   GLUCOSE 111* 09/13/2014   CHOL 172 09/13/2014   TRIG 120.0 09/13/2014   HDL 42.30  09/13/2014   LDLCALC 106* 09/13/2014   ALT 15 09/13/2014   AST 17 09/13/2014   NA 135 09/13/2014   K 3.4* 09/13/2014   CL 99 09/13/2014   CREATININE 0.98 09/13/2014   BUN 12 09/13/2014   CO2 29 09/13/2014   TSH 2.46 09/13/2014   INR 1.0 05/25/2007   HGBA1C 6.8* 09/13/2014    No results found.  Assessment & Plan:   [name] was seen today for hypertension.  Diagnoses and all orders for this visit:  Dermatitis, asteototic Orders: -     clobetasol ointment (TEMOVATE) 0.05 %; Apply 1 application topically 2 (two) times daily. -     cetirizine (ZYRTEC) 10 MG tablet; Take 1 tablet (10 mg total) by mouth daily.  Rash and nonspecific skin eruption- she has had a probable DFE, will avoid the ARB and HCTZ for now, improvement noted today  Eczema Orders: -     clobetasol ointment (TEMOVATE) 0.05 %; Apply 1 application topically 2 (two) times daily. -     cetirizine (ZYRTEC) 10 MG tablet; Take 1 tablet (10 mg total) by mouth daily.  Essential hypertension, benign- her BP is well controlled, will cont the current regimen Orders: -     indapamide (LOZOL) 2.5 MG tablet; Take 1 tablet (2.5 mg total) by mouth daily. -     nebivolol (BYSTOLIC) 5 MG tablet; Take 1 tablet (5 mg total) by mouth daily.   I am having [name] maintain her clobetasol ointment, cetirizine, indapamide, and nebivolol.  Meds ordered this encounter  Medications  . clobetasol ointment (TEMOVATE) 0.05 %    Sig: Apply 1 application topically 2 (two) times daily.    Dispense:  60 g    Refill:  2  . cetirizine (ZYRTEC) 10 MG tablet    Sig: Take 1 tablet (10 mg total) by mouth daily.    Dispense:  30 tablet    Refill:  11  . indapamide (LOZOL) 2.5 MG tablet    Sig: Take 1 tablet (2.5 mg total) by mouth daily.    Dispense:  90 tablet    Refill:  1  . nebivolol (BYSTOLIC) 5 MG tablet    Sig: Take 1 tablet (5 mg total) by mouth daily.    Dispense:  90 tablet    Refill:  1     Follow-up: No Follow-up on  file.  Scarlette Calico, MD

## 2014-10-11 NOTE — Patient Instructions (Signed)

## 2014-11-20 LAB — HM PAP SMEAR

## 2014-11-21 LAB — HM MAMMOGRAPHY: HM MAMMO: NORMAL (ref 0–4)

## 2014-11-22 ENCOUNTER — Telehealth: Payer: Self-pay | Admitting: Internal Medicine

## 2014-11-22 NOTE — Telephone Encounter (Signed)
yes

## 2014-11-22 NOTE — Telephone Encounter (Signed)
Patient wants to know if you have any samples for nebivolol (BYSTOLIC) 5 MG tablet [[id number] Please advise

## 2014-11-22 NOTE — Telephone Encounter (Signed)
LMOVM sampled r at the front

## 2014-11-26 ENCOUNTER — Encounter: Payer: Self-pay | Admitting: Internal Medicine

## 2014-12-05 ENCOUNTER — Encounter: Payer: Self-pay | Admitting: Internal Medicine

## 2015-01-09 ENCOUNTER — Encounter: Payer: Self-pay | Admitting: Family

## 2015-01-09 ENCOUNTER — Ambulatory Visit (INDEPENDENT_AMBULATORY_CARE_PROVIDER_SITE_OTHER): Payer: 59 | Admitting: Family

## 2015-01-09 VITALS — BP 112/80 | HR 69 | Temp 98.1°F | Resp 18 | Ht 61.0 in | Wt 299.4 lb

## 2015-01-09 DIAGNOSIS — M79644 Pain in right finger(s): Secondary | ICD-10-CM | POA: Diagnosis not present

## 2015-01-09 DIAGNOSIS — M1990 Unspecified osteoarthritis, unspecified site: Secondary | ICD-10-CM | POA: Insufficient documentation

## 2015-01-09 DIAGNOSIS — Z23 Encounter for immunization: Secondary | ICD-10-CM

## 2015-01-09 MED ORDER — NAPROXEN 500 MG PO TABS: 500.0000 mg | ORAL_TABLET | Freq: Two times a day (BID) | ORAL | Status: DC

## 2015-01-09 NOTE — Progress Notes (Signed)
Pre visit review using our clinic review tool, if applicable. No additional management support is needed unless otherwise documented below in the visit note.    Got flu shot

## 2015-01-09 NOTE — Progress Notes (Signed)
Subjective:    Patient ID: [name], female    DOB: Sep 29, 1965, 49 y.o.   MRN: [id number]  Chief Complaint  Patient presents with  . 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 female who  has a past medical history of History of colonic polyps; Headache(784.0); Hypertension; Osteoarthritis; TIA (transient ischemic attack) (2007); Obesity; Vasculitis; and Herniated disc. and presents today for an acute office visit.  Hand pain - This is a reemergence of a previous problem. Associated symptom of pain located in her right wrist and hand has been going on for about 2 weeks. There is also pain in the left, however it is greater on the right side.  Pain is described as sharp and achy with the severity around a 7-8/10 and constant. Also notes decreased grip strength.  Modifying factors include ibuprofen which does help with some of her discomfort and elevating her keyboard. Denies any trauma or sounds/sensations heard or felt. Timing of the symptoms is worse at night. Does do a lot of computer work.   No Known Allergies   Current Outpatient Prescriptions on File Prior to Visit  Medication Sig Dispense Refill  . cetirizine (ZYRTEC) 10 MG tablet Take 1 tablet (10 mg total) by mouth daily. 30 tablet 11  . indapamide (LOZOL) 2.5 MG tablet Take 1 tablet (2.5 mg total) by mouth daily. 90 tablet 1  . nebivolol (BYSTOLIC) 5 MG tablet Take 1 tablet (5 mg total) by mouth daily. 90 tablet 1  . potassium chloride SA (K-DUR,KLOR-CON) 20 MEQ tablet Take 1 tablet (20 mEq total) by mouth daily. 90 tablet 1   No current facility-administered medications on file prior to visit.    Past Surgical History  Procedure Laterality Date  . Abdominal hysterectomy      Review of  Systems  Constitutional: Negative for fever and chills.  Musculoskeletal:       Positive for bilateral hand pain.   Neurological: Positive for weakness and numbness.      Objective:    BP 112/80 mmHg  Pulse 69  Temp(Src) 98.1 F (36.7 C) (Oral)  Resp 18  Ht 5\' 1"  (1.549 m)  Wt 299 lb 6.4 oz (135.807 kg)  BMI 56.60 kg/m2  SpO2 98% Nursing note and vital signs reviewed.  Physical Exam  Constitutional: She is oriented to person, place, and time. She appears well-developed and well-nourished. No distress.  Cardiovascular: Normal rate, regular rhythm, normal heart sounds and intact distal pulses.   Pulmonary/Chest: Effort normal and breath sounds normal.  Musculoskeletal:  Right thumb - Mild edema with no obvious deformity or discoloration. Tenderness of extensor pollicis tendon, carpal tunnel and proximal wrist. Thumb range of motion is normal with pain noted in flexion and abduction. Negative ulnar collateral ligament test. Proximal pulses and capillary refill are intact and appropriate.   Neurological: She is alert and oriented to person, place, and time.  Skin: Skin is warm and dry.  Psychiatric: She has a normal mood and affect. Her behavior is normal. Judgment and thought content normal.       Assessment & Plan:   Problem List Items Addressed This Visit  Musculoskeletal and Integument   Arthritis    Other hand pain consistent with arthritis secondary to repetitive motions. Start naproxen. Ice/heat as needed with home exercise therapy. Follow up if symptoms worsen or fail to improve.       Relevant Medications   naproxen (NAPROSYN) 500 MG tablet     Other   Pain of right thumb - Primary    Symptoms and exam consistent with right thumb tendonitis secondary to repetitive motions. Start naproxen as needed for discomfort. Treat conservative with ice, home exercise program and thumb spica splint. Wean splint usage in 3-4 days as tolerated. Follow up if symptoms worsen or  fail to improve.       Relevant Medications   naproxen (NAPROSYN) 500 MG tablet    Other Visit Diagnoses    Encounter for immunization

## 2015-01-09 NOTE — Assessment & Plan Note (Signed)
Other hand pain consistent with arthritis secondary to repetitive motions. Start naproxen. Ice/heat as needed with home exercise therapy. Follow up if symptoms worsen or fail to improve.

## 2015-01-09 NOTE — Assessment & Plan Note (Signed)
Symptoms and exam consistent with right thumb tendonitis secondary to repetitive motions. Start naproxen as needed for discomfort. Treat conservative with ice, home exercise program and thumb spica splint. Wean splint usage in 3-4 days as tolerated. Follow up if symptoms worsen or fail to improve.

## 2015-01-09 NOTE — Patient Instructions (Addendum)
Thank you for choosing [facility].  Summary/Instructions:  Please obtain a thumb spica splint. Wear for the next 48 hours, especially at night.   Ice 2-3 times per day or more as needed.  Follow up if symptoms worsen or fail to improve.   Your prescription(s) have been submitted to your pharmacy or been printed and provided for you. Please take as directed and contact our office if you believe you are having problem(s) with the medication(s) or have any questions.  De Quervain's Disease Harriet Pho disease is a condition often seen in racquet sports where there is a soreness (inflammation) in the cord like structures (tendons) which attach muscle to bone on the thumb side of the wrist. There may be a tightening of the tissuesaround the tendons. This condition is often helped by giving up or modifying the activity which caused it. When conservative treatment does not help, surgery may be required. Conservative treatment could include changes in the activity which brought about the problem or made it worse. Anti-inflammatory medications and injections may be used to help decrease the inflammation and help with pain control. Your caregiver will help you determine which is best for you. DIAGNOSIS  Often the diagnosis (learning what is wrong) can be made by examination. Sometimes x-rays are required. HOME CARE INSTRUCTIONS   Apply ice to the sore area for 15-20 minutes, 03-04 times per day while awake. Put the ice in a plastic bag and place a towel between the bag of ice and your skin. This is especially helpful if it can be done after all activities involving the sore wrist.  Temporary splinting may help.  Only take over-the-counter or prescription medicines for pain, discomfort or fever as directed by your caregiver. SEEK MEDICAL CARE IF:   Pain relief is not obtained with medications, or if you have increasing pain and seem to be getting worse rather than better. MAKE SURE YOU:    Understand these instructions.  Will watch your condition.  Will get help right away if you are not doing well or get worse. Document Released: 12/23/2000 Document Revised: 06/22/2011 Document Reviewed: 08/02/2013 [facility] [employer] Patient Information 2015 [place], [place]. This information is not intended to replace advice given to you by your health care provider. Make sure you discuss any questions you have with your health care provider.

## 2015-02-06 ENCOUNTER — Encounter: Payer: Self-pay | Admitting: Internal Medicine

## 2015-02-14 ENCOUNTER — Ambulatory Visit: Payer: 59 | Admitting: Internal Medicine

## 2015-02-21 ENCOUNTER — Encounter: Payer: Self-pay | Admitting: Internal Medicine

## 2015-02-21 ENCOUNTER — Other Ambulatory Visit (INDEPENDENT_AMBULATORY_CARE_PROVIDER_SITE_OTHER): Payer: 59

## 2015-02-21 ENCOUNTER — Ambulatory Visit (INDEPENDENT_AMBULATORY_CARE_PROVIDER_SITE_OTHER): Payer: 59 | Admitting: Internal Medicine

## 2015-02-21 VITALS — BP 112/80 | HR 69 | Temp 97.8°F | Resp 16 | Ht 61.0 in | Wt 290.0 lb

## 2015-02-21 DIAGNOSIS — E118 Type 2 diabetes mellitus with unspecified complications: Secondary | ICD-10-CM | POA: Diagnosis not present

## 2015-02-21 DIAGNOSIS — E876 Hypokalemia: Secondary | ICD-10-CM

## 2015-02-21 DIAGNOSIS — I10 Essential (primary) hypertension: Secondary | ICD-10-CM | POA: Diagnosis not present

## 2015-02-21 LAB — URINALYSIS, ROUTINE W REFLEX MICROSCOPIC
Bilirubin Urine: NEGATIVE
Ketones, ur: NEGATIVE
Leukocytes, UA: NEGATIVE
NITRITE: NEGATIVE
Specific Gravity, Urine: 1.025 (ref 1.000–1.030)
Total Protein, Urine: NEGATIVE
URINE GLUCOSE: NEGATIVE
Urobilinogen, UA: 1 (ref 0.0–1.0)
pH: 6 (ref 5.0–8.0)

## 2015-02-21 LAB — BASIC METABOLIC PANEL
BUN: 16 mg/dL (ref 6–23)
CALCIUM: 10.1 mg/dL (ref 8.4–10.5)
CO2: 31 meq/L (ref 19–32)
CREATININE: 1.05 mg/dL (ref 0.40–1.20)
Chloride: 97 mEq/L (ref 96–112)
GFR: 71.93 mL/min (ref >60.00)
GLUCOSE: 134 mg/dL — AB (ref 70–99)
Potassium: 3.4 mEq/L — ABNORMAL LOW (ref 3.5–5.1)
Sodium: 137 mEq/L (ref 135–145)

## 2015-02-21 LAB — TSH: TSH: 1.46 u[IU]/mL (ref 0.35–4.50)

## 2015-02-21 LAB — HEMOGLOBIN A1C: Hgb A1c MFr Bld: 7 % — ABNORMAL HIGH (ref 4.6–6.5)

## 2015-02-21 LAB — MICROALBUMIN / CREATININE URINE RATIO
Creatinine,U: 150.3 mg/dL
MICROALB/CREAT RATIO: 0.5 mg/g (ref 0.0–30.0)

## 2015-02-21 MED ORDER — POTASSIUM CHLORIDE ER 10 MEQ PO CPCR: 20.0000 meq | ORAL_CAPSULE | Freq: Two times a day (BID) | ORAL | Status: DC

## 2015-02-21 NOTE — Progress Notes (Signed)
Subjective:  Patient ID: [name], female    DOB: 02/26/1966  Age: 49 y.o. MRN: [id number]  CC: Hypertension   HPI [name] presents for follow-up on hypertension. She had called a few weeks ago and complained of edema in her legs but she then noticed that it was related to high sodium intake and standing on her feet for long periods of time. She has since improved both of those and says there is no pain or swelling in her lower extremities today. She is also requesting that her potassium be changed from a tablet to a capsule because the tablets are too big and difficult to swallow. She offers no other complaints today.  Outpatient Prescriptions Prior to Visit  Medication Sig Dispense Refill  . cetirizine (ZYRTEC) 10 MG tablet Take 1 tablet (10 mg total) by mouth daily. 30 tablet 11  . indapamide (LOZOL) 2.5 MG tablet Take 1 tablet (2.5 mg total) by mouth daily. 90 tablet 1  . naproxen (NAPROSYN) 500 MG tablet Take 1 tablet (500 mg total) by mouth 2 (two) times daily with a meal. 60 tablet 0  . nebivolol (BYSTOLIC) 5 MG tablet Take 1 tablet (5 mg total) by mouth daily. 90 tablet 1  . potassium chloride SA (K-DUR,KLOR-CON) 20 MEQ tablet Take 1 tablet (20 mEq total) by mouth daily. (Patient not taking: Reported on 02/21/2015) 90 tablet 1   No facility-administered medications prior to visit.    ROS Review of Systems  Constitutional: Negative.  Negative for fever, chills, diaphoresis, appetite change and fatigue.  HENT: Negative.   Eyes: Negative.   Respiratory: Negative.  Negative for cough, choking, chest tightness, shortness of breath and stridor.   Cardiovascular: Negative.  Negative for chest pain, palpitations and leg swelling.  Gastrointestinal: Negative.  Negative for nausea, vomiting, abdominal pain, diarrhea and constipation.  Endocrine: Negative.   Genitourinary: Negative.   Musculoskeletal: Negative.   Skin: Negative.  Negative for rash.    Allergic/Immunologic: Negative.   Neurological: Negative.  Negative for dizziness, tremors, syncope, light-headedness, numbness and headaches.  Hematological: Negative.  Negative for adenopathy. Does not bruise/bleed easily.  Psychiatric/Behavioral: Negative.     Objective:  BP 112/80 mmHg  Pulse 69  Temp(Src) 97.8 F (36.6 C) (Oral)  Resp 16  Ht 5\' 1"  (1.549 m)  Wt 290 lb (131.543 kg)  BMI 54.82 kg/m2  SpO2 97%  BP Readings from Last 3 Encounters:  02/21/15 112/80  01/09/15 112/80  10/11/14 112/76    Wt Readings from Last 3 Encounters:  02/21/15 290 lb (131.543 kg)  01/09/15 299 lb 6.4 oz (135.807 kg)  10/11/14 293 lb (132.904 kg)    Physical Exam  Constitutional: She is oriented to person, place, and time. She appears well-developed and well-nourished. No distress.  HENT:  Head: Normocephalic and atraumatic.  Mouth/Throat: Oropharynx is clear and moist. No oropharyngeal exudate.  Eyes: Conjunctivae are normal. Right eye exhibits no discharge. Left eye exhibits no discharge. No scleral icterus.  Neck: Normal range of motion. Neck supple. No JVD present. No tracheal deviation present. No thyromegaly present.  Cardiovascular: Normal rate, regular rhythm, normal heart sounds and intact distal pulses.  Exam reveals no gallop and no friction rub.   No murmur heard. Pulmonary/Chest: Effort normal and breath sounds normal. No stridor. No respiratory distress. She has no wheezes. She has no rales. She exhibits no tenderness.  Abdominal: Soft. Bowel sounds are normal. She exhibits no distension and no mass. There is no tenderness.  There is no rebound and no guarding.  Musculoskeletal: Normal range of motion. She exhibits no edema or tenderness.  Lymphadenopathy:    She has no cervical adenopathy.  Neurological: She is oriented to person, place, and time.  Skin: Skin is warm and dry. No rash noted. She is not diaphoretic. No erythema. No pallor.    Lab Results  Component  Value Date   WBC 8.4 09/13/2014   HGB 11.9* 09/13/2014   HCT 36.3 09/13/2014   PLT 413.0* 09/13/2014   GLUCOSE 134* 02/21/2015   CHOL 172 09/13/2014   TRIG 120.0 09/13/2014   HDL 42.30 09/13/2014   LDLCALC 106* 09/13/2014   ALT 15 09/13/2014   AST 17 09/13/2014   NA 137 02/21/2015   K 3.4* 02/21/2015   CL 97 02/21/2015   CREATININE 1.05 02/21/2015   BUN 16 02/21/2015   CO2 31 02/21/2015   TSH 1.46 02/21/2015   INR 1.0 05/25/2007   HGBA1C 7.0* 02/21/2015   MICROALBUR <0.7 02/21/2015    No results found.  Assessment & Plan:   [name] was seen today for hypertension.  Diagnoses and all orders for this visit:  Essential hypertension, benign- her blood pressures well controlled -     Basic metabolic panel; Future -     TSH; Future -     Urinalysis, Routine w reflex microscopic (not at [facility]); Future -     potassium chloride (MICRO-K) 10 MEQ CR capsule; Take 2 capsules (20 mEq total) by mouth 2 (two) times daily.  Type 2 diabetes mellitus with complication, without long-term current use of insulin ([place])- her A1c is up to 7.0%. I do not think she should add an additional medication at this time, she agrees to work on her lifestyle modifications. -     Basic metabolic panel; Future -     Hemoglobin A1c; Future -     Microalbumin / creatinine urine ratio; Future  Hypokalemia- at her request we'll change the potassium supplement from a tablet or capsule. -     Basic metabolic panel; Future -     potassium chloride (MICRO-K) 10 MEQ CR capsule; Take 2 capsules (20 mEq total) by mouth 2 (two) times daily.   I have discontinued [name]'s potassium chloride SA. I am also having her start on potassium chloride. Additionally, I am having her maintain her cetirizine, indapamide, nebivolol, and naproxen.  Meds ordered this encounter  Medications  . potassium chloride (MICRO-K) 10 MEQ CR capsule    Sig: Take 2 capsules (20 mEq total) by mouth 2 (two) times daily.    Dispense:  60  capsule    Refill:  11     Follow-up: Return in about 6 months (around 08/21/2015).  Natalie Calico, MD

## 2015-02-21 NOTE — Patient Instructions (Signed)

## 2015-02-21 NOTE — Progress Notes (Signed)
Pre visit review using our clinic review tool, if applicable. No additional management support is needed unless otherwise documented below in the visit note. 

## 2015-03-18 ENCOUNTER — Encounter: Payer: Self-pay | Admitting: Family

## 2015-03-18 ENCOUNTER — Ambulatory Visit (INDEPENDENT_AMBULATORY_CARE_PROVIDER_SITE_OTHER): Payer: 59 | Admitting: Family

## 2015-03-18 VITALS — BP 120/84 | HR 83 | Temp 98.1°F | Resp 18 | Ht 61.0 in | Wt 298.8 lb

## 2015-03-18 DIAGNOSIS — J069 Acute upper respiratory infection, unspecified: Secondary | ICD-10-CM | POA: Diagnosis not present

## 2015-03-18 MED ORDER — AMOXICILLIN-POT CLAVULANATE 875-125 MG PO TABS: 1.0000 | ORAL_TABLET | Freq: Two times a day (BID) | ORAL | Status: DC

## 2015-03-18 NOTE — Assessment & Plan Note (Signed)
Symptoms and exam consistent with acute upper respiratory infection and sinusitis. Start Augmentin 1 day if symptoms continue to worsen. Otherwise continue watchful waiting at this time. Continue over-the-counter medications as needed for symptom relief and supportive care. Follow-up if symptoms worsen or fail to improve.

## 2015-03-18 NOTE — Progress Notes (Signed)
Subjective:    Patient ID: [name], female    DOB: 1965-05-17, 49 y.o.   MRN: [id number]  Chief Complaint  Patient presents with  . Cough    cough, loss of voice, congestions, body aches, SOB, feels like ears are clogged    HPI:  [name] is a 49 y.o. female who  has a past medical history of History of colonic polyps; Headache(784.0); Hypertension; Osteoarthritis; TIA (transient ischemic attack) (2007); Obesity; Vasculitis ([place]); and Herniated disc. and presents today for an acute office visit.   This is a new problem. Associated symptoms of cough, loss of voice, congestions, body aches, shortness of breath, and feeling like her ears are clogged have been going on for approximately 3 days. Modifying factors include Theraflu and Alkaseltzer flu which have helped for a short period of time. Timing of symptoms is worse during the evening and nights. Course of the illness has worsened over the last 3 days.  Denies any antibiotics recently.   No Known Allergies   Current Outpatient Prescriptions on File Prior to Visit  Medication Sig Dispense Refill  . cetirizine (ZYRTEC) 10 MG tablet Take 1 tablet (10 mg total) by mouth daily. 30 tablet 11  . indapamide (LOZOL) 2.5 MG tablet Take 1 tablet (2.5 mg total) by mouth daily. 90 tablet 1  . naproxen (NAPROSYN) 500 MG tablet Take 1 tablet (500 mg total) by mouth 2 (two) times daily with a meal. 60 tablet 0  . nebivolol (BYSTOLIC) 5 MG tablet Take 1 tablet (5 mg total) by mouth daily. 90 tablet 1  . potassium chloride (MICRO-K) 10 MEQ CR capsule Take 2 capsules (20 mEq total) by mouth 2 (two) times daily. 60 capsule 11   No current facility-administered medications on file prior to visit.    Review of Systems  Constitutional: Negative for fever and chills.  HENT: Positive for congestion, ear pain, sore throat and voice change.   Respiratory: Positive for cough.   Cardiovascular: Negative for chest pain.  Neurological:  Negative for headaches.      Objective:    BP 120/84 mmHg  Pulse 83  Temp(Src) 98.1 F (36.7 C) (Oral)  Resp 18  Ht 5\' 1"  (1.549 m)  Wt 298 lb 12.8 oz (135.535 kg)  BMI 56.49 kg/m2  SpO2 97% Nursing note and vital signs reviewed.  Physical Exam  Constitutional: She is oriented to person, place, and time. She appears well-developed and well-nourished. No distress.  HENT:  Right Ear: Hearing, tympanic membrane, external ear and ear canal normal.  Left Ear: Hearing, tympanic membrane, external ear and ear canal normal.  Nose: Right sinus exhibits maxillary sinus tenderness and frontal sinus tenderness. Left sinus exhibits maxillary sinus tenderness and frontal sinus tenderness.  Mouth/Throat: Uvula is midline, oropharynx is clear and moist and mucous membranes are normal.  Impacted cerumen noted right ear.  Neck: Neck supple.  Cardiovascular: Normal rate, regular rhythm, normal heart sounds and intact distal pulses.   Pulmonary/Chest: Effort normal and breath sounds normal.  Neurological: She is alert and oriented to person, place, and time.  Skin: Skin is warm and dry.  Psychiatric: She has a normal mood and affect. Her behavior is normal. Judgment and thought content normal.       Assessment & Plan:   Problem List Items Addressed This Visit      Respiratory   Acute upper respiratory infection - Primary    Symptoms and exam consistent with acute upper respiratory infection  and sinusitis. Start Augmentin 1 day if symptoms continue to worsen. Otherwise continue watchful waiting at this time. Continue over-the-counter medications as needed for symptom relief and supportive care. Follow-up if symptoms worsen or fail to improve.      Relevant Medications   amoxicillin-clavulanate (AUGMENTIN) 875-125 MG tablet

## 2015-03-18 NOTE — Patient Instructions (Signed)
Thank you for choosing [facility].  Summary/Instructions:  Your prescription(s) have been submitted to your pharmacy or been printed and provided for you. Please take as directed and contact our office if you believe you are having problem(s) with the medication(s) or have any questions.  If your symptoms worsen or fail to improve, please contact our office for further instruction, or in case of emergency go directly to the emergency room at the closest medical facility.    General Recommendations:    Please drink plenty of fluids.  Get plenty of rest   Sleep in humidified air  Use saline nasal sprays  Netti pot   OTC Medications:  Decongestants - helps relieve congestion   Flonase (generic fluticasone) or Nasacort (generic triamcinolone) - please make sure to use the "cross-over" technique at a 45 degree angle towards the opposite eye as opposed to straight up the nasal passageway.   Sudafed (generic pseudoephedrine - Note this is the one that is available behind the pharmacy counter); Products with phenylephrine (-PE) may also be used but is often not as effective as pseudoephedrine.   If you have HIGH BLOOD PRESSURE - Coricidin HBP; AVOID any product that is -D as this contains pseudoephedrine which may increase your blood pressure.  Afrin (oxymetazoline) every 6-8 hours for up to 3 days.   Allergies - helps relieve runny nose, itchy eyes and sneezing   Claritin (generic loratidine), Allegra (fexofenidine), or Zyrtec (generic cyrterizine) for runny nose. These medications should not cause drowsiness.  Note - Benadryl (generic diphenhydramine) may be used however may cause drowsiness  Cough -   Delsym or Robitussin (generic dextromethorphan)  Expectorants - helps loosen mucus to ease removal   Mucinex (generic guaifenesin) as directed on the package.  Headaches / General Aches   Tylenol (generic acetaminophen) - DO NOT EXCEED 3 grams (3,000 mg) in a 24  hour time period  Advil/Motrin (generic ibuprofen)   Sore Throat -   Salt water gargle   Chloraseptic (generic benzocaine) spray or lozenges / Sucrets (generic dyclonine)    Upper Respiratory Infection, Adult Most upper respiratory infections (URIs) are a viral infection of the air passages leading to the lungs. A URI affects the nose, throat, and upper air passages. The most common type of URI is nasopharyngitis and is typically referred to as "the common cold." URIs run their course and usually go away on their own. Most of the time, a URI does not require medical attention, but sometimes a bacterial infection in the upper airways can follow a viral infection. This is called a secondary infection. Sinus and middle ear infections are common types of secondary upper respiratory infections. Bacterial pneumonia can also complicate a URI. A URI can worsen asthma and chronic obstructive pulmonary disease (COPD). Sometimes, these complications can require emergency medical care and may be life threatening.  CAUSES Almost all URIs are caused by viruses. A virus is a type of germ and can spread from one person to another.  RISKS FACTORS You may be at risk for a URI if:   You smoke.   You have chronic heart or lung disease.  You have a weakened defense (immune) system.   You are very young or very old.   You have nasal allergies or asthma.  You work in crowded or poorly ventilated areas.  You work in health care facilities or schools. SIGNS AND SYMPTOMS  Symptoms typically develop 2-3 days after you come in contact with a cold virus. Most   viral URIs last 7-10 days. However, viral URIs from the influenza virus (flu virus) can last 14-18 days and are typically more severe. Symptoms may include:   Runny or stuffy (congested) nose.   Sneezing.   Cough.   Sore throat.   Headache.   Fatigue.   Fever.   Loss of appetite.   Pain in your forehead, behind your eyes, and  over your cheekbones (sinus pain).  Muscle aches.  DIAGNOSIS  Your health care provider may diagnose a URI by:  Physical exam.  Tests to check that your symptoms are not due to another condition such as:  Strep throat.  Sinusitis.  Pneumonia.  Asthma. TREATMENT  A URI goes away on its own with time. It cannot be cured with medicines, but medicines may be prescribed or recommended to relieve symptoms. Medicines may help:  Reduce your fever.  Reduce your cough.  Relieve nasal congestion. HOME CARE INSTRUCTIONS   Take medicines only as directed by your health care provider.   Gargle warm saltwater or take cough drops to comfort your throat as directed by your health care provider.  Use a warm mist humidifier or inhale steam from a shower to increase air moisture. This may make it easier to breathe.  Drink enough fluid to keep your urine clear or pale yellow.   Eat soups and other clear broths and maintain good nutrition.   Rest as needed.   Return to work when your temperature has returned to normal or as your health care provider advises. You may need to stay home longer to avoid infecting others. You can also use a face mask and careful hand washing to prevent spread of the virus.  Increase the usage of your inhaler if you have asthma.   Do not use any tobacco products, including cigarettes, chewing tobacco, or electronic cigarettes. If you need help quitting, ask your health care provider. PREVENTION  The best way to protect yourself from getting a cold is to practice good hygiene.   Avoid oral or hand contact with people with cold symptoms.   Wash your hands often if contact occurs.  There is no clear evidence that vitamin C, vitamin E, echinacea, or exercise reduces the chance of developing a cold. However, it is always recommended to get plenty of rest, exercise, and practice good nutrition.  SEEK MEDICAL CARE IF:   You are getting worse rather than  better.   Your symptoms are not controlled by medicine.   You have chills.  You have worsening shortness of breath.  You have brown or red mucus.  You have yellow or brown nasal discharge.  You have pain in your face, especially when you bend forward.  You have a fever.  You have swollen neck glands.  You have pain while swallowing.  You have white areas in the back of your throat. SEEK IMMEDIATE MEDICAL CARE IF:   You have severe or persistent:  Headache.  Ear pain.  Sinus pain.  Chest pain.  You have chronic lung disease and any of the following:  Wheezing.  Prolonged cough.  Coughing up blood.  A change in your usual mucus.  You have a stiff neck.  You have changes in your:  Vision.  Hearing.  Thinking.  Mood. MAKE SURE YOU:   Understand these instructions.  Will watch your condition.  Will get help right away if you are not doing well or get worse.   This information is not intended to replace advice   given to you by your health care provider. Make sure you discuss any questions you have with your health care provider.   Document Released: 09/23/2000 Document Revised: 08/14/2014 Document Reviewed: 07/05/2013 [employer] Interactive Patient Education 2016 [employer]  

## 2015-03-18 NOTE — Progress Notes (Signed)
Pre visit review using our clinic review tool, if applicable. No additional management support is needed unless otherwise documented below in the visit note. 

## 2015-04-11 ENCOUNTER — Encounter: Payer: Self-pay | Admitting: Internal Medicine

## 2015-04-11 NOTE — Telephone Encounter (Signed)
Patient works at [employer] [facility]---she doesn't really feel like this is heart related, but i did advise patient that with her current hx of stroke and hypertension, she should seek care at ED --patient said she would check with one of the doctors at heart care and then make a decision about going to ED

## 2015-04-25 ENCOUNTER — Encounter: Payer: Self-pay | Admitting: Cardiovascular Disease

## 2015-04-25 ENCOUNTER — Ambulatory Visit (INDEPENDENT_AMBULATORY_CARE_PROVIDER_SITE_OTHER): Payer: 59 | Admitting: Cardiovascular Disease

## 2015-04-25 VITALS — BP 142/96 | HR 81 | Ht 60.0 in | Wt 304.9 lb

## 2015-04-25 DIAGNOSIS — M79605 Pain in left leg: Secondary | ICD-10-CM

## 2015-04-25 DIAGNOSIS — R609 Edema, unspecified: Secondary | ICD-10-CM

## 2015-04-25 DIAGNOSIS — E785 Hyperlipidemia, unspecified: Secondary | ICD-10-CM

## 2015-04-25 DIAGNOSIS — R600 Localized edema: Secondary | ICD-10-CM | POA: Diagnosis not present

## 2015-04-25 DIAGNOSIS — R0602 Shortness of breath: Secondary | ICD-10-CM | POA: Insufficient documentation

## 2015-04-25 DIAGNOSIS — M79604 Pain in right leg: Secondary | ICD-10-CM | POA: Diagnosis not present

## 2015-04-25 DIAGNOSIS — M79609 Pain in unspecified limb: Secondary | ICD-10-CM | POA: Diagnosis not present

## 2015-04-25 HISTORY — DX: Edema, unspecified: R60.9

## 2015-04-25 HISTORY — DX: Shortness of breath: R06.02

## 2015-04-25 LAB — CBC
HEMATOCRIT: 36.4 % (ref 36.0–46.0)
Hemoglobin: 11.9 g/dL — ABNORMAL LOW (ref 12.0–15.0)
MCH: 26.3 pg (ref 26.0–34.0)
MCHC: 32.7 g/dL (ref 30.0–36.0)
MCV: 80.5 fL (ref 78.0–100.0)
MPV: 10.5 fL (ref 8.6–12.4)
PLATELETS: 299 10*3/uL (ref 150–400)
RBC: 4.52 MIL/uL (ref 3.87–5.11)
RDW: 16.6 % — AB (ref 11.5–15.5)
WBC: 7.5 10*3/uL (ref 4.0–10.5)

## 2015-04-25 LAB — BASIC METABOLIC PANEL
BUN: 14 mg/dL (ref 7–25)
CHLORIDE: 104 mmol/L (ref 98–110)
CO2: 25 mmol/L (ref 20–31)
CREATININE: 0.81 mg/dL (ref 0.50–1.10)
Calcium: 9.3 mg/dL (ref 8.6–10.2)
Glucose, Bld: 115 mg/dL — ABNORMAL HIGH (ref 65–99)
POTASSIUM: 4.1 mmol/L (ref 3.5–5.3)
SODIUM: 139 mmol/L (ref 135–146)

## 2015-04-25 NOTE — Progress Notes (Signed)
Cardiology Office Note   Date:  04/25/2015   ID:  [name], DOB [birth year], MRN [id number]  PCP:  Scarlette Calico, MD  Cardiologist:   Sharol Harness, MD   Chief Complaint  Patient presents with  . New Patient (Initial Visit)  . Shortness of Breath  . Edema     History of Present Illness: [name] is a 50 y.o. female with hypertension, diabetes mellitus type 2, stroke, and hyperlipidemia who presents for evaluation of shortness of breath.  [name] has noted intermittent swelling in her legs since 2010.   [name] saw her PCP, Dr. Scarlette Calico, on 02/21/15.  At that appointment she complained of lower extremity edema.  Last summer she noted lower extremity edema and painful nodules on her anterior tibia bilaterally. She was initially thought to have erythema nodosum. However this was not seen on biopsy. It was felt that her symptoms may be due to a drug reaction. Her losartan/records combination pill was discontinued. She was started on indapamide and bystolic Since that time she has not noted as many of the painful nodules.  She does, however continued to have lower extremity edema. She thinks it has worsened since making this medication change.  She does note that it improves with elevation of her legs.   Over the last 2 weeks [name] has noted shortness of breath with exertion. It occurs when walking up stairs or even when walking on flat ground. She's also noted it when talking for prolonged periods of time.  [name] denies orthopnea or PND.  There is no associated chest pain lightheadedness, dizziness, or palpitations.  [name] does not exercise regularly.  She has an elliptical machine at home but does not use it.  She prefers the treadmill. She hasn't been walking regularly for the last 2 years because of pain in her legs.  In the past she liked water aerobics but has not been doing this lately.  Past Medical History  Diagnosis Date    . History of colonic polyps   . Headache(784.0)   . Hypertension   . Osteoarthritis   . TIA (transient ischemic attack) 2007    hx of  . Obesity   . Vasculitis (HCC)     L leg - Polyarthritis  . Herniated disc   . Edema 04/25/2015  . Shortness of breath 04/25/2015    Past Surgical History  Procedure Laterality Date  . Abdominal hysterectomy       Current Outpatient Prescriptions  Medication Sig Dispense Refill  . cetirizine (ZYRTEC) 10 MG tablet Take 10 mg by mouth daily as needed for allergies.    . indapamide (LOZOL) 2.5 MG tablet Take 1 tablet (2.5 mg total) by mouth daily. 90 tablet 1  . naproxen (NAPROSYN) 500 MG tablet Take 1 tablet (500 mg total) by mouth 2 (two) times daily with a meal. 60 tablet 0  . nebivolol (BYSTOLIC) 5 MG tablet Take 1 tablet (5 mg total) by mouth daily. 90 tablet 1  . potassium chloride (MICRO-K) 10 MEQ CR capsule Take 2 capsules (20 mEq total) by mouth 2 (two) times daily. 60 capsule 11   No current facility-administered medications for this visit.    Allergies:   Review of patient's allergies indicates no known allergies.    Social History:  The patient  reports that she has never smoked. She has never used smokeless tobacco. She reports that she does not drink alcohol or use  illicit drugs.   Family History:  The patient's family history includes Arthritis in her other; Cancer in her father; Hypertension in her father, mother, and other; Stroke in her mother and other.    ROS:  Please see the history of present illness.   Otherwise, review of systems are positive for none.   All other systems are reviewed and negative.    PHYSICAL EXAM: VS:  BP 142/96 mmHg  Pulse 81  Ht 5' (1.524 m)  Wt 138.302 kg (304 lb 14.4 oz)  BMI 59.55 kg/m2 , BMI Body mass index is 59.55 kg/(m^2). GENERAL:  Well appearing HEENT:  Pupils equal round and reactive, fundi not visualized, oral mucosa unremarkable NECK:  No jugular venous distention, waveform within  normal limits, carotid upstroke brisk and symmetric, no bruits, no thyromegaly LYMPHATICS:  No cervical adenopathy LUNGS:  Clear to auscultation bilaterally HEART:  RRR.  PMI not displaced or sustained,S1 and S2 within normal limits, no S3, no S4, no clicks, no rubs, no murmurs ABD:  Flat, positive bowel sounds normal in frequency in pitch, no bruits, no rebound, no guarding, no midline pulsatile mass, no hepatomegaly, no splenomegaly EXT:  2 plus pulses throughout, trace edema on the right, no edema on the L, no cyanosis no clubbing SKIN:  No rashes.  Few tender nodules on bilateral lower extremities.  Chronic stasis changes on the R anterior tibia. NEURO:  Cranial nerves II through XII grossly intact, motor grossly intact throughout PSYCH:  Cognitively intact, oriented to person place and time    EKG:  EKG is ordered today. The ekg ordered today demonstrates inus rhythm. Rate 81 bpm.  TEE 05/27/07:  SUMMARY - Overall left ventricular systolic function was normal. Left    ventricular wall thickness was moderately increased. - The thoracic aorta is normal with no complex plaque. - The left atrium was mildly dilated. The left atrial appendage    function was normal (normal emptying velocity). There was no    left atrial appendage thrombus identified. - No intracardiac shunt was detected by contrast study with two    injections of agitated saline. - Suspect small vessel disease or hypertension as probable etiology    for stroke. No cardiac source of cerebral embolism.  Recent Labs: 09/13/2014: ALT 15; Hemoglobin 11.9*; Platelets 413.0* 02/21/2015: BUN 16; Creatinine, Ser 1.05; Potassium 3.4*; Sodium 137; TSH 1.46    Lipid Panel    Component Value Date/Time   CHOL 172 09/13/2014 1446   TRIG 120.0 09/13/2014 1446   HDL 42.30 09/13/2014 1446   CHOLHDL 4 09/13/2014 1446   VLDL 24.0 09/13/2014 1446   LDLCALC 106* 09/13/2014 1446      Wt Readings from  Last 3 Encounters:  04/25/15 138.302 kg (304 lb 14.4 oz)  03/18/15 135.535 kg (298 lb 12.8 oz)  02/21/15 131.543 kg (290 lb)      ASSESSMENT AND PLAN:  # Shortness of breath: [name]'s shortness of breath is rather acute.  She also has edema that is slightly worse in the right leg than the left.  She has to sit a lot while at work.  We will check lower extremity venous Dopplers to assess for thrombotic disease.  We will also check a d-dimer.  Additionally, we will check an echocardiogram to evaluate for structural heart disease and heart failure.  # Edema: [name] does not appear to have heart failure on exam. Her neck veins are not elevated. She also denies orthopnea or PND. She does have  lower extremity edema that is long-standing and not currently severe.We will check an echo, d-dimer and ultrasound as above.  I suspect that the edema may be due to venous insufficiency.  # Hypertension: Blood pressure is poorly controlled today. She states that it usually is within normal range.  E will check her blood pressure in the office next week. If it remains elevated we will consider making changes to her regimen at that time.  # Hyperlipidemia: [name] is diabetic and not on a statin.  LDL 106 09/13/14.  We'll discuss it at the next appointment in 1 month.  # Obesity:  We discussed the importance of regular exercise Ideally, she should be getting at least 30-40 minutes of exercise most days of the week. She previously liked water aerobics and will consider going back to the [facility].  Current medicines are reviewed at length with the patient today.  The patient does not have concerns regarding medicines.  The following changes have been made:  no change  Labs/ tests ordered today include:   Orders Placed This Encounter  Procedures  . Basic metabolic panel  . D-dimer, quantitative (not at [facility])  . CBC  . EKG 12-Lead  . ECHOCARDIOGRAM COMPLETE      Disposition:   FU with Jaleyah Longhi C. Oval Linsey, MD, [facility] in 1 month.    This note was written with the assistance of speech recognition software.  Please excuse any transcriptional errors.  Signed, Ramey Schiff C. Oval Linsey, MD, [facility]  04/25/2015 12:54 PM    [place]

## 2015-04-25 NOTE — Patient Instructions (Signed)
Your physician has requested that you have an echocardiogram. Echocardiography is a painless test that uses sound waves to create images of your heart. It provides your doctor with information about the size and shape of your heart and how well your heart's chambers and valves are working. This procedure takes approximately one hour. There are no restrictions for this procedure.  Your physician has requested that you have a lower  extremity venous duplex  FOR DVT. This test is an ultrasound of the veins in the legs. It looks at venous blood flow that carries blood from the heart to the legs. Allow one hour for a Lower Venous exam. There are no restrictions or special instructions.  LABS - D-DIMER, BMP,CBC  Your physician recommends that you schedule a follow-up appointment in [place]- F/U WITH RESULTS

## 2015-04-26 ENCOUNTER — Encounter (HOSPITAL_COMMUNITY): Payer: 59

## 2015-04-26 ENCOUNTER — Ambulatory Visit (HOSPITAL_COMMUNITY)
Admission: RE | Admit: 2015-04-26 | Discharge: 2015-04-26 | Disposition: A | Discharge status: Home / Self Care | Payer: 59 | Source: Ambulatory Visit | Attending: Cardiology | Admitting: Cardiology

## 2015-04-26 DIAGNOSIS — E785 Hyperlipidemia, unspecified: Secondary | ICD-10-CM | POA: Insufficient documentation

## 2015-04-26 DIAGNOSIS — I071 Rheumatic tricuspid insufficiency: Secondary | ICD-10-CM | POA: Insufficient documentation

## 2015-04-26 DIAGNOSIS — I10 Essential (primary) hypertension: Secondary | ICD-10-CM | POA: Insufficient documentation

## 2015-04-26 DIAGNOSIS — I340 Nonrheumatic mitral (valve) insufficiency: Secondary | ICD-10-CM | POA: Insufficient documentation

## 2015-04-26 DIAGNOSIS — M79604 Pain in right leg: Secondary | ICD-10-CM | POA: Diagnosis not present

## 2015-04-26 DIAGNOSIS — R0600 Dyspnea, unspecified: Secondary | ICD-10-CM | POA: Diagnosis not present

## 2015-04-26 DIAGNOSIS — R609 Edema, unspecified: Secondary | ICD-10-CM | POA: Insufficient documentation

## 2015-04-26 DIAGNOSIS — R0602 Shortness of breath: Secondary | ICD-10-CM | POA: Insufficient documentation

## 2015-04-26 DIAGNOSIS — M79605 Pain in left leg: Secondary | ICD-10-CM | POA: Insufficient documentation

## 2015-04-26 DIAGNOSIS — R600 Localized edema: Secondary | ICD-10-CM

## 2015-04-26 DIAGNOSIS — I517 Cardiomegaly: Secondary | ICD-10-CM | POA: Diagnosis not present

## 2015-04-26 DIAGNOSIS — E119 Type 2 diabetes mellitus without complications: Secondary | ICD-10-CM | POA: Diagnosis not present

## 2015-04-26 LAB — D-DIMER, QUANTITATIVE: D-Dimer, Quant: 0.45 ug/mL-FEU (ref 0.00–0.48)

## 2015-05-02 ENCOUNTER — Other Ambulatory Visit: Payer: Self-pay | Admitting: Internal Medicine

## 2015-05-02 ENCOUNTER — Encounter: Payer: Self-pay | Admitting: Internal Medicine

## 2015-05-02 DIAGNOSIS — I10 Essential (primary) hypertension: Secondary | ICD-10-CM

## 2015-05-02 MED ORDER — INDAPAMIDE 2.5 MG PO TABS: 2.5000 mg | ORAL_TABLET | Freq: Every day | ORAL | Status: DC

## 2015-05-08 ENCOUNTER — Other Ambulatory Visit (HOSPITAL_COMMUNITY): Payer: 59

## 2015-05-23 ENCOUNTER — Ambulatory Visit: Payer: 59 | Admitting: Cardiovascular Disease

## 2015-05-23 ENCOUNTER — Encounter: Payer: Self-pay | Admitting: Internal Medicine

## 2015-06-02 NOTE — Progress Notes (Signed)
This encounter was created in error - please disregard.

## 2015-06-03 ENCOUNTER — Encounter: Payer: 59 | Admitting: Cardiovascular Disease

## 2015-06-12 ENCOUNTER — Ambulatory Visit: Payer: 59 | Admitting: Cardiovascular Disease

## 2015-07-12 ENCOUNTER — Ambulatory Visit (INDEPENDENT_AMBULATORY_CARE_PROVIDER_SITE_OTHER): Payer: 59 | Admitting: Cardiovascular Disease

## 2015-07-12 ENCOUNTER — Encounter: Payer: Self-pay | Admitting: Cardiovascular Disease

## 2015-07-12 VITALS — BP 120/84 | HR 82 | Ht 62.0 in | Wt 298.4 lb

## 2015-07-12 DIAGNOSIS — R0602 Shortness of breath: Secondary | ICD-10-CM

## 2015-07-12 DIAGNOSIS — R600 Localized edema: Secondary | ICD-10-CM | POA: Diagnosis not present

## 2015-07-12 DIAGNOSIS — I776 Arteritis, unspecified: Secondary | ICD-10-CM

## 2015-07-12 DIAGNOSIS — I10 Essential (primary) hypertension: Secondary | ICD-10-CM

## 2015-07-12 DIAGNOSIS — E785 Hyperlipidemia, unspecified: Secondary | ICD-10-CM

## 2015-07-12 MED ORDER — ATORVASTATIN CALCIUM 10 MG PO TABS: 10.0000 mg | ORAL_TABLET | Freq: Every day | ORAL | Status: DC

## 2015-07-12 NOTE — Addendum Note (Signed)
Addended by: Alvina Filbert B on: 07/12/2015 03:05 PM   Modules accepted: Orders

## 2015-07-12 NOTE — Progress Notes (Deleted)
Cardiology Office Note   Date:  07/12/2015   ID:  [name], DOB May 06, 1965, MRN [id number]  PCP:  Scarlette Calico, MD  Cardiologist:   Sharol Harness, MD   No chief complaint on file.    Patient ID: [name] is a 50 y.o. female with hypertension, diabetes mellitus type 2, stroke, and hyperlipidemia who presents for evaluation of shortness of breath.    Interval History 07/12/15: After her last appointment [name] had an echo that revealed LVEF 60-65% and normal diastolic function.  She had lower extremity dopplers that were negative for DVT.   Statin Exercise BP  History of Present Illness 04/25/15: [name] has noted intermittent swelling in her legs since 2010.   [name] saw her PCP, Dr. Scarlette Calico, on 02/21/15.  At that appointment she complained of lower extremity edema.  Last summer she noted lower extremity edema and painful nodules on her anterior tibia bilaterally. She was initially thought to have erythema nodosum. However this was not seen on biopsy. It was felt that her symptoms may be due to a drug reaction. Her losartan/records combination pill was discontinued. She was started on indapamide and bystolic Since that time she has not noted as many of the painful nodules.  She does, however continued to have lower extremity edema. She thinks it has worsened since making this medication change.  She does note that it improves with elevation of her legs.   Over the last 2 weeks [name] has noted shortness of breath with exertion. It occurs when walking up stairs or even when walking on flat ground. She's also noted it when talking for prolonged periods of time.  [name] denies orthopnea or PND.  There is no associated chest pain lightheadedness, dizziness, or palpitations.  [name] does not exercise regularly.  She has an elliptical machine at home but does not use it.  She prefers the treadmill. She hasn't been walking  regularly for the last 2 years because of pain in her legs.  In the past she liked water aerobics but has not been doing this lately.  Past Medical History  Diagnosis Date  . History of colonic polyps   . Headache(784.0)   . Hypertension   . Osteoarthritis   . TIA (transient ischemic attack) 2007    hx of  . Obesity   . Vasculitis (HCC)     L leg - Polyarthritis  . Herniated disc   . Edema 04/25/2015  . Shortness of breath 04/25/2015    Past Surgical History  Procedure Laterality Date  . Abdominal hysterectomy       Current Outpatient Prescriptions  Medication Sig Dispense Refill  . cetirizine (ZYRTEC) 10 MG tablet Take 10 mg by mouth daily as needed for allergies.    . indapamide (LOZOL) 2.5 MG tablet Take 1 tablet (2.5 mg total) by mouth daily. 90 tablet 1  . naproxen (NAPROSYN) 500 MG tablet Take 1 tablet (500 mg total) by mouth 2 (two) times daily with a meal. 60 tablet 0  . nebivolol (BYSTOLIC) 5 MG tablet Take 1 tablet (5 mg total) by mouth daily. 90 tablet 1  . potassium chloride (MICRO-K) 10 MEQ CR capsule Take 2 capsules (20 mEq total) by mouth 2 (two) times daily. 60 capsule 11   No current facility-administered medications for this visit.    Allergies:   Review of patient's allergies indicates no known allergies.    Social History:  The patient  reports that she has never smoked. She has never used smokeless tobacco. She reports that she does not drink alcohol or use illicit drugs.   Family History:  The patient's family history includes Arthritis in her other; Cancer in her father; Hypertension in her father, mother, and other; Stroke in her mother and other.    ROS:  Please see the history of present illness.   Otherwise, review of systems are positive for none.   All other systems are reviewed and negative.    PHYSICAL EXAM: VS:  There were no vitals taken for this visit. , BMI There is no weight on file to calculate BMI. GENERAL:  Well appearing HEENT:   Pupils equal round and reactive, fundi not visualized, oral mucosa unremarkable NECK:  No jugular venous distention, waveform within normal limits, carotid upstroke brisk and symmetric, no bruits, no thyromegaly LYMPHATICS:  No cervical adenopathy LUNGS:  Clear to auscultation bilaterally HEART:  RRR.  PMI not displaced or sustained,S1 and S2 within normal limits, no S3, no S4, no clicks, no rubs, no murmurs ABD:  Flat, positive bowel sounds normal in frequency in pitch, no bruits, no rebound, no guarding, no midline pulsatile mass, no hepatomegaly, no splenomegaly EXT:  2 plus pulses throughout, trace edema on the right, no edema on the L, no cyanosis no clubbing SKIN:  No rashes.  Few tender nodules on bilateral lower extremities.  Chronic stasis changes on the R anterior tibia. NEURO:  Cranial nerves II through XII grossly intact, motor grossly intact throughout PSYCH:  Cognitively intact, oriented to person place and time    EKG:  EKG is ordered today. The ekg ordered today demonstrates inus rhythm. Rate 81 bpm.  TEE 05/27/07:  SUMMARY - Overall left ventricular systolic function was normal. Left    ventricular wall thickness was moderately increased. - The thoracic aorta is normal with no complex plaque. - The left atrium was mildly dilated. The left atrial appendage    function was normal (normal emptying velocity). There was no    left atrial appendage thrombus identified. - No intracardiac shunt was detected by contrast study with two    injections of agitated saline. - Suspect small vessel disease or hypertension as probable etiology    for stroke. No cardiac source of cerebral embolism.  Echo 1/04/16/15: Study Conclusions  - Left ventricle: The cavity size was normal. There was mild hypertrophy of the septal wall and moderate hypertrophy of the posterior wall. Systolic function was normal. The estimated ejection fraction was in the range  of 60% to 65%. Wall motion was normal; there were no regional wall motion abnormalities. Left ventricular diastolic function parameters were normal. - Aortic valve: Valve area (VTI): 1.6 cm^2. Valve area (Vmax): 1.59 cm^2. Valve area (Vmean): 1.63 cm^2. - Mitral valve: There was trivial regurgitation. - Left atrium: The atrium was mildly dilated. - Right ventricle: The cavity size was normal. Wall thickness was normal. Systolic function was normal. - Tricuspid valve: There was trivial regurgitation. - Inferior vena cava: The vessel was normal in size. The respirophasic diameter changes were in the normal range (>= 50%), consistent with normal central venous pressure.   Recent Labs: 09/13/2014: ALT 15 02/21/2015: TSH 1.46 04/25/2015: BUN 14; Creat 0.81; Hemoglobin 11.9*; Platelets 299; Potassium 4.1; Sodium 139    Lipid Panel    Component Value Date/Time   CHOL 172 09/13/2014 1446   TRIG 120.0 09/13/2014 1446   HDL 42.30 09/13/2014 1446   CHOLHDL  4 09/13/2014 1446   VLDL 24.0 09/13/2014 1446   LDLCALC 106* 09/13/2014 1446      Wt Readings from Last 3 Encounters:  04/25/15 138.302 kg (304 lb 14.4 oz)  03/18/15 135.535 kg (298 lb 12.8 oz)  02/21/15 131.543 kg (290 lb)      ASSESSMENT AND PLAN:  # Shortness of breath: [name] shortness of breath is rather acute.  She also has edema that is slightly worse in the right leg than the left.  She has to sit a lot while at work.  We will check lower extremity venous Dopplers to assess for thrombotic disease.  We will also check a d-dimer.  Additionally, we will check an echocardiogram to evaluate for structural heart disease and heart failure.  # Edema: [name] does not appear to have heart failure on exam. Her neck veins are not elevated. She also denies orthopnea or PND. She does have lower extremity edema that is long-standing and not currently severe.We will check an echo, d-dimer and ultrasound as above.  I  suspect that the edema may be due to venous insufficiency.  # Hypertension: Blood pressure is poorly controlled today. She states that it usually is within normal range.  E will check her blood pressure in the office next week. If it remains elevated we will consider making changes to her regimen at that time.  # Hyperlipidemia: [name] is diabetic and not on a statin.  LDL 106 09/13/14.  We'll discuss it at the next appointment in 1 month.  # Obesity:  We discussed the importance of regular exercise Ideally, she should be getting at least 30-40 minutes of exercise most days of the week. She previously liked water aerobics and will consider going back to the [facility].  Current medicines are reviewed at length with the patient today.  The patient does not have concerns regarding medicines.  The following changes have been made:  no change  Labs/ tests ordered today include:   No orders of the defined types were placed in this encounter.     Disposition:   FU with Khamauri Bauernfeind C. Oval Linsey, MD, [facility] in 1 month.    This note was written with the assistance of speech recognition software.  Please excuse any transcriptional errors.  Signed, Shamina Etheridge C. Oval Linsey, MD, [facility]  07/12/2015 7:42 AM    [place]

## 2015-07-12 NOTE — Patient Instructions (Signed)
Medication Instructions:  START ATORVASTATIN 10 MG DAILY   Labwork: LIPID/LFT'S AT PCP SOON AND REPEAT IN ABOUT 6 WEEKS  Testing/Procedures: NONE  Follow-Up: Your physician wants you to follow-up in: [place] will receive a reminder letter in the mail two months in advance. If you don't receive a letter, please call our office to schedule the follow-up appointment.  If you need a refill on your cardiac medications before your next appointment, please call your pharmacy.

## 2015-07-12 NOTE — Progress Notes (Signed)
Cardiology Office Note   Date:  07/12/2015   ID:  [name], DOB 07-Nov-1965, MRN [id number]  PCP:  Natalie Calico, MD  Cardiologist:   Natalie Harness, MD   Chief Complaint  Patient presents with  . Follow-up    pt c/o mild dizziness--random; woke up dizzy last week--not sure if it was vertigo, states she could feel it in her eyes     Patient ID:  [name] is a 50 y.o. female with hypertension, diabetes mellitus type 2, stroke, and hyperlipidemia who presents for follow up on shortness of breath.   Interval History 07/12/15: After her last appointment [name] had an echo that revealed LVEF 60-65% and normal diastolic function.  She had lower extremity dopplers that were negative for DVT.   She continues to have some lower extremity edema, though it is somewhat improved. The left leg always goes more than the right. She tries to elevate her legs when possible.  She also notices that when she takes ibuprofen the swelling improves by the following day. Her legs are tender to touch.  She denies any orthopnea or PND. [name] has started walking more lately. She walks for 1 mile at a time. She denies any chest pain or pressure with exertion. After walking she notes that the swelling is worse in her legs. She has also been working on her diet. She is eating more salmon and baking her meats instead of frying them.  With these efforts she has lost 6 pounds since her last visit.   History of Present Illness 04/25/15: [name] has noted intermittent swelling in her legs since 2010.   [name] saw her PCP, Dr. Scarlette [name], on 02/21/15.  At that appointment she complained of lower extremity edema.  Last summer she noted lower extremity edema and painful nodules on her anterior tibia bilaterally. She was initially thought to have erythema nodosum. However this was not seen on biopsy. It was felt that her symptoms may be due to a drug reaction. Her losartan/records  combination pill was discontinued. She was started on indapamide and bystolic Since that time she has not noted as many of the painful nodules.  She does, however continued to have lower extremity edema. She thinks it has worsened since making this medication change.  She does note that it improves with elevation of her legs.   Over the last 2 weeks [name] has noted shortness of breath with exertion. It occurs when walking up stairs or even when walking on flat ground. She's also noted it when talking for prolonged periods of time.  [name] denies orthopnea or PND.  There is no associated chest pain lightheadedness, dizziness, or palpitations.  [name] does not exercise regularly.  She has an elliptical machine at home but does not use it.  She prefers the treadmill. She hasn't been walking regularly for the last 2 years because of pain in her legs.  In the past she liked water aerobics but has not been doing this lately.  [name] has noted intermittent swelling in her legs since 2010.   [name] saw her PCP, Dr. Scarlette [name], on 02/21/15.  At that appointment she complained of lower extremity edema.  Last summer she noted lower extremity edema and painful nodules on her anterior tibia bilaterally. She was initially thought to have erythema nodosum. However this was not seen on biopsy. It was felt that her symptoms may be due  to a drug reaction. Her losartan/records combination pill was discontinued. She was started on indapamide and bystolic Since that time she has not noted as many of the painful nodules.  She does, however continued to have lower extremity edema. She thinks it has worsened since making this medication change.  She does note that it improves with elevation of her legs.   Over the last 2 weeks [name] has noted shortness of breath with exertion. It occurs when walking up stairs or even when walking on flat ground. She's also noted it when talking for prolonged  periods of time.  [name] denies orthopnea or PND.  There is no associated chest pain lightheadedness, dizziness, or palpitations.  [name] does not exercise regularly.  She has an elliptical machine at home but does not use it.  She prefers the treadmill. She hasn't been walking regularly for the last 2 years because of pain in her legs.  In the past she liked water aerobics but has not been doing this lately.  Past Medical History  Diagnosis Date  . History of colonic polyps   . Headache(784.0)   . Hypertension   . Osteoarthritis   . TIA (transient ischemic attack) 2007    hx of  . Obesity   . Vasculitis (HCC)     L leg - Polyarthritis  . Herniated disc   . Edema 04/25/2015  . Shortness of breath 04/25/2015    Past Surgical History  Procedure Laterality Date  . Abdominal hysterectomy       Current Outpatient Prescriptions  Medication Sig Dispense Refill  . cetirizine (ZYRTEC) 10 MG tablet Take 10 mg by mouth daily as needed for allergies.    . indapamide (LOZOL) 2.5 MG tablet Take 1 tablet (2.5 mg total) by mouth daily. 90 tablet 1  . naproxen (NAPROSYN) 500 MG tablet Take 1 tablet (500 mg total) by mouth 2 (two) times daily with a meal. 60 tablet 0  . nebivolol (BYSTOLIC) 5 MG tablet Take 1 tablet (5 mg total) by mouth daily. 90 tablet 1  . atorvastatin (LIPITOR) 10 MG tablet Take 1 tablet (10 mg total) by mouth daily. 90 tablet 3   No current facility-administered medications for this visit.    Allergies:   Review of patient's allergies indicates no known allergies.    Social History:  The patient  reports that she has never smoked. She has never used smokeless tobacco. She reports that she does not drink alcohol or use illicit drugs.   Family History:  The patient's family history includes Arthritis in her other; Cancer in her father; Hypertension in her father, mother, and other; Stroke in her mother and other.    ROS:  Please see the history of present  illness.   Otherwise, review of systems are positive for none.   All other systems are reviewed and negative.    PHYSICAL EXAM: VS:  BP 120/84 mmHg  Pulse 82  Ht 5\' 2"  (1.575 m)  Wt 135.353 kg (298 lb 6.4 oz)  BMI 54.56 kg/m2 , BMI Body mass index is 54.56 kg/(m^2). GENERAL:  Well appearing HEENT:  Pupils equal round and reactive, fundi not visualized, oral mucosa unremarkable NECK:  No jugular venous distention, waveform within normal limits, carotid upstroke brisk and symmetric, no bruits, no thyromegaly LYMPHATICS:  No cervical adenopathy LUNGS:  Clear to auscultation bilaterally HEART:  RRR.  PMI not displaced or sustained,S1 and S2 within normal limits, no S3, no S4, no clicks,  no rubs, no murmurs ABD:  Flat, positive bowel sounds normal in frequency in pitch, no bruits, no rebound, no guarding, no midline pulsatile mass, no hepatomegaly, no splenomegaly EXT:  2 plus pulses throughout, trace edema bilaterally.  No cyanosis no clubbing SKIN:  No rashes.  Few tender nodules on bilateral lower extremities.  Chronic stasis changes on the R anterior tibia. NEURO:  Cranial nerves II through XII grossly intact, motor grossly intact throughout PSYCH:  Cognitively intact, oriented to person place and time   EKG:  EKG is not ordered today.  TEE 05/27/07:  SUMMARY - Overall left ventricular systolic function was normal. Left    ventricular wall thickness was moderately increased. - The thoracic aorta is normal with no complex plaque. - The left atrium was mildly dilated. The left atrial appendage    function was normal (normal emptying velocity). There was no    left atrial appendage thrombus identified. - No intracardiac shunt was detected by contrast study with two    injections of agitated saline. - Suspect small vessel disease or hypertension as probable etiology    for stroke. No cardiac source of cerebral embolism.  Recent Labs: 09/13/2014: ALT  15 02/21/2015: TSH 1.46 04/25/2015: BUN 14; Creat 0.81; Hemoglobin 11.9*; Platelets 299; Potassium 4.1; Sodium 139    Lipid Panel    Component Value Date/Time   CHOL 172 09/13/2014 1446   TRIG 120.0 09/13/2014 1446   HDL 42.30 09/13/2014 1446   CHOLHDL 4 09/13/2014 1446   VLDL 24.0 09/13/2014 1446   LDLCALC 106* 09/13/2014 1446      Wt Readings from Last 3 Encounters:  07/12/15 135.353 kg (298 lb 6.4 oz)  04/25/15 138.302 kg (304 lb 14.4 oz)  03/18/15 135.535 kg (298 lb 12.8 oz)      ASSESSMENT AND PLAN:  # Shortness of breath: [name]'s shortness of breath is improving. Her echo was normal and her d-dimer was low, making it very unlikely that she has a DVT.  I recommended that she continue to increase her physical activity, as this will likely improve her shortness of breath over time.  # Edema: Her symptoms are likely due to vasculitis, with possible venous stasis contributing.  I recommended that she continue to limit her salt and fluid intake. Continue wearing compression stockings and elevating her legs when possible. Given that NSAIDs helped the swelling, I'm suspicious that her vasculitis may be contributing to the edema. She will continue follow-up with her dermatologist and rheumatologist. Burnis Medin also refer her to remain in vascular specialist to determine whether venous insufficiency may be contributing.  # Hypertension: Blood pressure is well-controlled today. I suspect that if she continues to lose weight and improve her diet this will continue to get better. Continue by systolic and we will not make any medication changes at this time.  # Hyperlipidemia: [name] is diabetic and not on a statin.  ASCVD 10 year risk is 6%.   we will start atorvastatin 10 mg daily. We will check a conference and metabolic panel and lipids in 6 weeks. She will get these checked with her PCP and have them sent to [place].  # Obesity:  We discussed the importance of regular exercise  Ideally, she should be getting at least 30-40 minutes of exercise most days of the week. She was congratulated on her increased exercise and improve her diet.   Current medicines are reviewed at length with the patient today.  The patient does not have concerns regarding  medicines.  The following changes have been made:  no change  Labs/ tests ordered today include:   No orders of the defined types were placed in this encounter.     Disposition:   FU with Aniruddh Ciavarella C. Oval Linsey, MD, [facility] in 1 month.    This note was written with the assistance of speech recognition software.  Please excuse any transcriptional errors.  Signed, Ming Kunka C. Oval Linsey, MD, [facility]  07/12/2015 1:13 PM    [facility]

## 2015-07-18 ENCOUNTER — Encounter: Payer: Self-pay | Admitting: Internal Medicine

## 2015-07-20 DIAGNOSIS — H524 Presbyopia: Secondary | ICD-10-CM | POA: Diagnosis not present

## 2015-07-20 DIAGNOSIS — H11153 Pinguecula, bilateral: Secondary | ICD-10-CM | POA: Diagnosis not present

## 2015-07-20 DIAGNOSIS — H5213 Myopia, bilateral: Secondary | ICD-10-CM | POA: Diagnosis not present

## 2015-07-20 DIAGNOSIS — H52223 Regular astigmatism, bilateral: Secondary | ICD-10-CM | POA: Diagnosis not present

## 2015-07-30 ENCOUNTER — Other Ambulatory Visit: Payer: Self-pay | Admitting: *Deleted

## 2015-07-30 DIAGNOSIS — R600 Localized edema: Secondary | ICD-10-CM

## 2015-08-21 ENCOUNTER — Ambulatory Visit: Payer: 59 | Admitting: Internal Medicine

## 2015-08-21 LAB — HM DIABETES EYE EXAM

## 2015-08-27 ENCOUNTER — Telehealth: Payer: Self-pay | Admitting: Internal Medicine

## 2015-08-27 NOTE — Telephone Encounter (Signed)
Patient would like to know if Dr. Ronnald Ramp had more samples of Bystolic?

## 2015-08-28 NOTE — Telephone Encounter (Signed)
Called pt no answer LMOM samples ready for p/u...[name]

## 2015-08-28 NOTE — Telephone Encounter (Signed)
Yes

## 2015-08-29 ENCOUNTER — Encounter: Payer: 59 | Admitting: Internal Medicine

## 2015-08-31 DIAGNOSIS — E119 Type 2 diabetes mellitus without complications: Secondary | ICD-10-CM | POA: Diagnosis not present

## 2015-08-31 DIAGNOSIS — H524 Presbyopia: Secondary | ICD-10-CM | POA: Diagnosis not present

## 2015-08-31 DIAGNOSIS — H5213 Myopia, bilateral: Secondary | ICD-10-CM | POA: Diagnosis not present

## 2015-08-31 DIAGNOSIS — H52223 Regular astigmatism, bilateral: Secondary | ICD-10-CM | POA: Diagnosis not present

## 2015-09-19 ENCOUNTER — Encounter: Payer: 59 | Admitting: Internal Medicine

## 2015-09-23 ENCOUNTER — Encounter: Payer: Self-pay | Admitting: Vascular Surgery

## 2015-10-02 ENCOUNTER — Ambulatory Visit (HOSPITAL_COMMUNITY)
Admission: RE | Admit: 2015-10-02 | Discharge: 2015-10-02 | Disposition: A | Discharge status: Home / Self Care | Payer: 59 | Source: Ambulatory Visit | Attending: Vascular Surgery | Admitting: Vascular Surgery

## 2015-10-02 ENCOUNTER — Ambulatory Visit (INDEPENDENT_AMBULATORY_CARE_PROVIDER_SITE_OTHER): Payer: 59 | Admitting: Vascular Surgery

## 2015-10-02 ENCOUNTER — Encounter: Payer: Self-pay | Admitting: Vascular Surgery

## 2015-10-02 VITALS — BP 123/81 | HR 78 | Ht 62.0 in | Wt 300.0 lb

## 2015-10-02 DIAGNOSIS — I872 Venous insufficiency (chronic) (peripheral): Secondary | ICD-10-CM | POA: Diagnosis not present

## 2015-10-02 DIAGNOSIS — R609 Edema, unspecified: Secondary | ICD-10-CM | POA: Insufficient documentation

## 2015-10-02 DIAGNOSIS — R600 Localized edema: Secondary | ICD-10-CM

## 2015-10-02 NOTE — Progress Notes (Signed)
[facility] [place]  Patient name: [name] MRN: [id number] DOB: [birth year] Sex: female  REASON FOR CONSULT: Leg swelling. Referred by Rodriguez Camp.  HPI: [name] is a 50 y.o. female, who is referred for evaluation of chronic venous insufficiency. Patient denies any previous history of DVT or phlebitis. She has no history of lymphedema. She has had a previous hysterectomy but denies any other abdominal surgery groin surgery or radiation therapy. She experiences aching pain in both legs which is aggravated by standing and sitting and relieved with elevation. Her symptoms are more significant on the right side. 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   I have reviewed the records from Dr. Blenda Mounts office.  The patient was seen there on 07/12/2015 for a follow up visit. She had some shortness of breath but her echo was normal. She did have some edema in the lower extremities and possible evidence of venous stasis. She was sent for vascular consultation. At the time of her visit her blood pressure was well controlled and her cholesterol also was well controlled.  Past Medical History  Diagnosis Date  . History of colonic polyps   . Headache(784.0)   . Hypertension   . Osteoarthritis   . TIA (transient ischemic attack) 2007    hx of  . Obesity   . Vasculitis (HCC)     L leg - Polyarthritis  . Herniated disc   . Edema 04/25/2015  . Shortness of breath 04/25/2015    Family History  Problem Relation Age of Onset  . Arthritis Other   . Hypertension Other   . Stroke Other   . Stroke Mother   . Hypertension Mother   . AAA (abdominal aortic aneurysm) Mother   . Hypertension Father   . Cancer Father     Prostate    SOCIAL HISTORY: Social History   Social History  . Marital Status: Single    Spouse Name: N/A  . Number of Children: N/A  . Years of Education: N/A   Occupational History  .  Not on file.   Social History Main Topics  . Smoking status: Never Smoker   . Smokeless tobacco: Never Used  . Alcohol Use: No     Comment: occasionally  . Drug Use: No  . Sexual Activity: Yes    Birth Control/ Protection: Surgical   Other Topics Concern  . Not on file   Social History Narrative    No Known Allergies  Current Outpatient Prescriptions  Medication Sig Dispense Refill  . indapamide (LOZOL) 2.5 MG tablet Take 1 tablet (2.5 mg total) by mouth daily. 90 tablet 1  . nebivolol (BYSTOLIC) 5 MG tablet Take 1 tablet (5 mg total) by mouth daily. 90 tablet 1  . atorvastatin (LIPITOR) 10 MG tablet Take 1 tablet (10 mg total) by mouth daily. (Patient not taking: Reported on 10/02/2015) 90 tablet 3  . cetirizine (ZYRTEC) 10 MG tablet Take 10 mg by mouth daily as needed for allergies. Reported on 10/02/2015    . naproxen (NAPROSYN) 500 MG tablet Take 1 tablet (500 mg total) by mouth 2 (two) times daily with a meal. (Patient not taking: Reported on 10/02/2015) 60 tablet 0   No current facility-administered medications for this visit.    REVIEW OF SYSTEMS:  [X]  denotes positive finding, [ ]  denotes negative finding Cardiac  Comments:  Chest pain or chest pressure:    Shortness  of breath upon exertion:    Short of breath when lying flat:    Irregular heart rhythm:        Vascular    Pain in calf, thigh, or hip brought on by ambulation: X   Pain in feet at night that wakes you up from your sleep:     Blood clot in your veins:    Leg swelling:  X       Pulmonary    Oxygen at home:    Productive cough:     Wheezing:         Neurologic    Sudden weakness in arms or legs:     Sudden numbness in arms or legs:     Sudden onset of difficulty speaking or slurred speech:    Temporary loss of vision in one eye:     Problems with dizziness:         Gastrointestinal    Blood in stool:     Vomited blood:         Genitourinary    Burning when urinating:     Blood in urine:          Psychiatric    Major depression:         Hematologic    Bleeding problems:    Problems with blood clotting too easily:        Skin    Rashes or ulcers:        Constitutional    Fever or chills:      PHYSICAL EXAM: Filed Vitals:   10/02/15 1355  BP: 123/81  Pulse: 78  Height: 5\' 2"  (1.575 m)  Weight: 300 lb (136.079 kg)  SpO2: 95%    GENERAL: The patient is a well-nourished female, in no acute distress. The vital signs are documented above. CARDIAC: There is a regular rate and rhythm.  VASCULAR: I do not detect carotid bruits. She has palpable dorsalis pedis pulses bilaterally. She has mild bilateral lower extremity swelling. PULMONARY: There is good air exchange bilaterally without wheezing or rales. ABDOMEN: Soft and non-tender with normal pitched bowel sounds.  MUSCULOSKELETAL: There are no major deformities or cyanosis. NEUROLOGIC: No focal weakness or paresthesias are detected. SKIN: She has hyperpigmentation bilaterally but more significantly on the right side. PSYCHIATRIC: The patient has a normal affect.  DATA:   BILATERAL LOWER EXTREMITY VENOUS DUPLEX: have independently interpreted her bilateral lower extremity venous duplex scan.  On the right side, there is no evidence of DVT or superficial thrombophlebitis. There is deep vein reflux noted in the common femoral vein. There is only a short segment of reflux noted in the right great saphenous vein.  On the left side, there is no evidence of DVT or superficial thrombophlebitis. There is deep vein reflux noted in the common femoral vein on the left. There is reflux in the saphenofemoral junction on the left and also in a segment of the great saphenous vein just below the knee.  MEDICAL ISSUES:  CHRONIC VENOUS INSUFFICIENCY: based on the patient's venous duplex study and her exam I think she does have chronic venous insufficiency. On the right side she has hyperpigmentation with reflux in the common femoral  vein and also a short segment of the right great saphenous vein. He also has reflux in the left common femoral vein and saphenous system on the left. The reflux in thesaphenous vein on both sides is not significant to consider laser ablation. We have discussed conservative treatment for her  chronic venous insufficiency. I discussed the importance of intermittent leg elevation in the proper positioning for this. As she has not tolerated compression stockings in the past I have tried her in a knee high stocking with a very mild gradient. I'm encouraged her to avoid prolonged sitting and standing. I have encouraged her to walk and exercise as much as possible. In addition she belongs to the [facility] and she will do water aerobics which I think is also very helpful for people with venous insufficiency. I have also encouraged her to keep her skin well lubricated. I'll be happy to see her back at any time if her symptoms progress.   Deitra Mayo [facility] [place] [phone]

## 2015-10-03 ENCOUNTER — Encounter: Payer: 59 | Admitting: Internal Medicine

## 2015-12-05 ENCOUNTER — Encounter: Payer: Self-pay | Admitting: Internal Medicine

## 2015-12-05 ENCOUNTER — Other Ambulatory Visit (INDEPENDENT_AMBULATORY_CARE_PROVIDER_SITE_OTHER): Payer: 59

## 2015-12-05 ENCOUNTER — Ambulatory Visit (INDEPENDENT_AMBULATORY_CARE_PROVIDER_SITE_OTHER): Payer: 59 | Admitting: Internal Medicine

## 2015-12-05 VITALS — BP 122/78 | HR 74 | Temp 98.4°F | Resp 16 | Ht 62.0 in | Wt 303.0 lb

## 2015-12-05 DIAGNOSIS — I10 Essential (primary) hypertension: Secondary | ICD-10-CM | POA: Diagnosis not present

## 2015-12-05 DIAGNOSIS — Z0000 Encounter for general adult medical examination without abnormal findings: Secondary | ICD-10-CM

## 2015-12-05 DIAGNOSIS — E118 Type 2 diabetes mellitus with unspecified complications: Secondary | ICD-10-CM

## 2015-12-05 DIAGNOSIS — R0683 Snoring: Secondary | ICD-10-CM | POA: Insufficient documentation

## 2015-12-05 DIAGNOSIS — Z23 Encounter for immunization: Secondary | ICD-10-CM | POA: Diagnosis not present

## 2015-12-05 LAB — URINALYSIS, ROUTINE W REFLEX MICROSCOPIC
Bilirubin Urine: NEGATIVE
Ketones, ur: NEGATIVE
Leukocytes, UA: NEGATIVE
Nitrite: NEGATIVE
Specific Gravity, Urine: 1.025 (ref 1.000–1.030)
UROBILINOGEN UA: 0.2 (ref 0.0–1.0)
Urine Glucose: NEGATIVE
pH: 5.5 (ref 5.0–8.0)

## 2015-12-05 LAB — POCT GLYCOSYLATED HEMOGLOBIN (HGB A1C): HEMOGLOBIN A1C: 8.1

## 2015-12-05 LAB — POCT GLUCOSE (DEVICE FOR HOME USE): POC Glucose: 111 mg/dl — AB (ref 70–99)

## 2015-12-05 MED ORDER — EMPAGLIFLOZIN-METFORMIN HCL ER 5-1000 MG PO TB24: 1.0000 | ORAL_TABLET | Freq: Every day | ORAL | 1 refills | Status: DC

## 2015-12-05 MED ORDER — EMPAGLIFLOZIN-METFORMIN HCL ER 5-1000 MG PO TB24: 2.0000 | ORAL_TABLET | Freq: Every day | ORAL | 1 refills | Status: DC

## 2015-12-05 MED ORDER — GLUCOSE BLOOD VI STRP: ORAL_STRIP | 12 refills | Status: DC

## 2015-12-05 MED ORDER — ONETOUCH VERIO W/DEVICE KIT: 1.0000 | PACK | Freq: Two times a day (BID) | 0 refills | Status: DC

## 2015-12-05 NOTE — Patient Instructions (Signed)

## 2015-12-05 NOTE — Progress Notes (Signed)
Subjective:  Patient ID: [name], female    DOB: 05-15-1965  Age: 50 y.o. MRN: [id number]  CC: Annual Exam; Hypertension; and Diabetes   HPI [name] presents for a CPX.  She complains of fatigue, weight gain, and heavy snoring.  She has had a few episodes of blurred vision and is concerned that her blood sugars are not well-controlled.  She tells me her blood pressure has been well controlled on Lozol and nebivolol. She's had no recent episodes of headache/chest pain/shortness of breath/edema/palpitations/or near syncope.  Outpatient Medications Prior to Visit  Medication Sig Dispense Refill  . cetirizine (ZYRTEC) 10 MG tablet Take 10 mg by mouth daily as needed for allergies. Reported on 10/02/2015    . naproxen (NAPROSYN) 500 MG tablet Take 1 tablet (500 mg total) by mouth 2 (two) times daily with a meal. 60 tablet 0  . nebivolol (BYSTOLIC) 5 MG tablet Take 1 tablet (5 mg total) by mouth daily. 90 tablet 1  . atorvastatin (LIPITOR) 10 MG tablet Take 1 tablet (10 mg total) by mouth daily. 90 tablet 3  . indapamide (LOZOL) 2.5 MG tablet Take 1 tablet (2.5 mg total) by mouth daily. 90 tablet 1   No facility-administered medications prior to visit.     ROS Review of Systems  Constitutional: Positive for fatigue and unexpected weight change. Negative for appetite change, diaphoresis and fever.  HENT: Negative.   Eyes: Positive for visual disturbance.  Respiratory: Negative.  Negative for cough, choking, chest tightness, shortness of breath and stridor.   Cardiovascular: Negative.  Negative for chest pain, palpitations and leg swelling.  Gastrointestinal: Negative.  Negative for abdominal pain, blood in stool, constipation, diarrhea, nausea and vomiting.  Endocrine: Positive for polyphagia and polyuria. Negative for polydipsia.  Genitourinary: Negative.  Negative for decreased urine volume, difficulty urinating, dysuria, flank pain and urgency.  Musculoskeletal:  Negative.  Negative for back pain, joint swelling, myalgias and neck pain.  Skin: Negative.  Negative for color change and rash.  Allergic/Immunologic: Negative.   Neurological: Negative.  Negative for dizziness, tremors, weakness, light-headedness, numbness and headaches.  Hematological: Negative.  Negative for adenopathy.  Psychiatric/Behavioral: Negative.     Objective:  BP 122/78 (BP Location: Left Arm, Patient Position: Sitting, Cuff Size: Normal)   Pulse 74   Temp 98.4 F (36.9 C) (Oral)   Resp 16   Ht 5' 2"  (1.575 m)   Wt (!) 303 lb (137.4 kg)   SpO2 98%   BMI 55.42 kg/m   BP Readings from Last 3 Encounters:  12/05/15 122/78  10/02/15 123/81  07/12/15 120/84    Wt Readings from Last 3 Encounters:  12/05/15 (!) 303 lb (137.4 kg)  10/02/15 300 lb (136.1 kg)  07/12/15 298 lb 6.4 oz (135.4 kg)    Physical Exam  Constitutional: She is oriented to person, place, and time. No distress.  HENT:  Head: Normocephalic and atraumatic.  Mouth/Throat: Oropharynx is clear and moist. No oropharyngeal exudate.  Eyes: Conjunctivae are normal. Right eye exhibits no discharge. Left eye exhibits no discharge. No scleral icterus.  Neck: Normal range of motion. Neck supple. No JVD present. No tracheal deviation present. No thyromegaly present.  Cardiovascular: Normal rate, regular rhythm, normal heart sounds and intact distal pulses.  Exam reveals no gallop and no friction rub.   No murmur heard. Pulmonary/Chest: Effort normal and breath sounds normal. No stridor. No respiratory distress. She has no wheezes. She has no rales. She exhibits no tenderness.  Abdominal: Soft. Bowel sounds are normal. She exhibits no distension and no mass. There is no tenderness. There is no rebound and no guarding.  Musculoskeletal: Normal range of motion. She exhibits no edema, tenderness or deformity.  Lymphadenopathy:    She has no cervical adenopathy.  Neurological: She is oriented to person, place, and  time.  Skin: Skin is warm and dry. No rash noted. She is not diaphoretic. No erythema. No pallor.  Psychiatric: She has a normal mood and affect. Her behavior is normal. Judgment and thought content normal.  Vitals reviewed.   Lab Results  Component Value Date   WBC 7.5 04/25/2015   HGB 11.9 (L) 04/25/2015   HCT 36.4 04/25/2015   PLT 299 04/25/2015   GLUCOSE 115 (H) 04/25/2015   CHOL 172 09/13/2014   TRIG 120.0 09/13/2014   HDL 42.30 09/13/2014   LDLCALC 106 (H) 09/13/2014   ALT 15 09/13/2014   AST 17 09/13/2014   NA 139 04/25/2015   K 4.1 04/25/2015   CL 104 04/25/2015   CREATININE 0.81 04/25/2015   BUN 14 04/25/2015   CO2 25 04/25/2015   TSH 1.46 02/21/2015   INR 1.0 05/25/2007   HGBA1C 8.1 12/05/2015   MICROALBUR 1.5 12/05/2015    No results found.  Assessment & Plan:   [name] was seen today for annual exam, hypertension and diabetes.  Diagnoses and all orders for this visit:  Need for prophylactic vaccination and inoculation against influenza -     Flu Vaccine QUAD 36+ mos IM  Essential hypertension, benign- her blood pressure is adequately well controlled, I will monitor her electrolytes and renal function.  Type 2 diabetes mellitus with complication, without long-term current use of insulin ([place])- her A1c is up to 8.1%, I think she would benefit from a combination of metformin plus an SGLT2 inhibitor. Will start her at a low dose and gradually increase the dose over the next few months. Will also send her for diabetic education. -     Microalbumin / creatinine urine ratio; Future -     POCT glycosylated hemoglobin (Hb A1C) -     POCT Glucose (Device for Home Use) -     Discontinue: Empagliflozin-Metformin HCl ER (SYNJARDY XR) 08-998 MG TB24; Take 1 tablet by mouth daily. -     Empagliflozin-Metformin HCl ER (SYNJARDY XR) 08-998 MG TB24; Take 2 tablets by mouth daily. -     glucose blood (ONETOUCH VERIO) test strip; Use BID -     Blood Glucose Monitoring Suppl  (ONETOUCH VERIO) w/Device KIT; 1 Act by Does not apply route 2 (two) times daily. -     Amb Referral to Nutrition and Diabetic E  Snoring- she has signs and symptoms concerning for sleep apnea, I've asked her to see sleep medicine to consider having a sleep study performed. -     Ambulatory referral to Pulmonology  Routine general medical examination at a health care facility -     Lipid panel; Future -     Comprehensive metabolic panel; Future -     CBC with Differential/Platelet; Future -     Urinalysis, Routine w reflex microscopic (not at [facility]); Future -     TSH; Future   I have discontinued [name]'s atorvastatin. I have also changed her Empagliflozin-Metformin HCl ER. Additionally, I am having her start on glucose blood and ONETOUCH VERIO. Lastly, I am having her maintain her nebivolol, naproxen, and cetirizine.  Meds ordered this encounter  Medications  .  DISCONTD: Empagliflozin-Metformin HCl ER (SYNJARDY XR) 08-998 MG TB24    Sig: Take 1 tablet by mouth daily.    Dispense:  90 tablet    Refill:  1  . Empagliflozin-Metformin HCl ER (SYNJARDY XR) 08-998 MG TB24    Sig: Take 2 tablets by mouth daily.    Dispense:  180 tablet    Refill:  1  . glucose blood (ONETOUCH VERIO) test strip    Sig: Use BID    Dispense:  100 each    Refill:  12  . Blood Glucose Monitoring Suppl (ONETOUCH VERIO) w/Device KIT    Sig: 1 Act by Does not apply route 2 (two) times daily.    Dispense:  2 kit    Refill:  0     Follow-up: Return in about 4 months (around 04/05/2016).  Scarlette Calico, MD

## 2015-12-05 NOTE — Progress Notes (Signed)
Pre visit review using our clinic review tool, if applicable. No additional management support is needed unless otherwise documented below in the visit note. 8.1

## 2015-12-06 ENCOUNTER — Telehealth: Payer: Self-pay | Admitting: Internal Medicine

## 2015-12-06 ENCOUNTER — Encounter: Payer: Self-pay | Admitting: Internal Medicine

## 2015-12-06 DIAGNOSIS — I10 Essential (primary) hypertension: Secondary | ICD-10-CM

## 2015-12-06 DIAGNOSIS — E118 Type 2 diabetes mellitus with unspecified complications: Secondary | ICD-10-CM

## 2015-12-06 LAB — MICROALBUMIN / CREATININE URINE RATIO
Creatinine,U: 454.4 mg/dL
MICROALB/CREAT RATIO: 0.3 mg/g (ref 0.0–30.0)
Microalb, Ur: 1.5 mg/dL (ref 0.0–1.9)

## 2015-12-06 MED ORDER — INDAPAMIDE 2.5 MG PO TABS: 2.5000 mg | ORAL_TABLET | Freq: Every day | ORAL | 1 refills | Status: DC

## 2015-12-06 NOTE — Telephone Encounter (Signed)
Pt called that she took Empagliflozin-Metformin HCl ER (SYNJARDY XR) 08-998 MG TB24 this morning and this med made her dizzy,upset stomach and nausea , not sure if she should continue to take this med.   Pt also asking about the diabetic nutrition class and learn how to use to glucose meter. Please call her back

## 2015-12-06 NOTE — Telephone Encounter (Signed)
Pt is requesting a referral to a nutritionist. This order is pended in this encounter for PCP review.   Pt stated that her BS was 103 before she took the medication. It was after that she had sx of dizziness. I informed pt that her sx are more than likely stemming from her BS dropping too low and to take the medication after a full meal.

## 2015-12-07 ENCOUNTER — Encounter: Payer: Self-pay | Admitting: Internal Medicine

## 2015-12-12 ENCOUNTER — Encounter: Payer: Self-pay | Admitting: Internal Medicine

## 2015-12-18 ENCOUNTER — Other Ambulatory Visit (INDEPENDENT_AMBULATORY_CARE_PROVIDER_SITE_OTHER): Payer: 59

## 2015-12-18 DIAGNOSIS — Z0000 Encounter for general adult medical examination without abnormal findings: Secondary | ICD-10-CM

## 2015-12-18 LAB — CBC WITH DIFFERENTIAL/PLATELET
BASOS ABS: 0 10*3/uL (ref 0.0–0.1)
Basophils Relative: 0.5 % (ref 0.0–3.0)
EOS PCT: 3.5 % (ref 0.0–5.0)
Eosinophils Absolute: 0.3 10*3/uL (ref 0.0–0.7)
HEMATOCRIT: 36.5 % (ref 36.0–46.0)
HEMOGLOBIN: 12.2 g/dL (ref 12.0–15.0)
LYMPHS PCT: 30 % (ref 12.0–46.0)
Lymphs Abs: 2.7 10*3/uL (ref 0.7–4.0)
MCHC: 33.3 g/dL (ref 30.0–36.0)
MCV: 80.8 fl (ref 78.0–100.0)
MONOS PCT: 5.9 % (ref 3.0–12.0)
Monocytes Absolute: 0.5 10*3/uL (ref 0.1–1.0)
NEUTROS PCT: 60.1 % (ref 43.0–77.0)
Neutro Abs: 5.4 10*3/uL (ref 1.4–7.7)
Platelets: 316 10*3/uL (ref 150.0–400.0)
RBC: 4.52 Mil/uL (ref 3.87–5.11)
RDW: 16.5 % — ABNORMAL HIGH (ref 11.5–15.5)
WBC: 8.9 10*3/uL (ref 4.0–10.5)

## 2015-12-18 LAB — LIPID PANEL
Cholesterol: 161 mg/dL (ref 0–200)
HDL: 43.2 mg/dL (ref >39.00)
LDL Cholesterol: 98 mg/dL (ref 0–99)
NONHDL: 117.55
TRIGLYCERIDES: 99 mg/dL (ref 0.0–149.0)
Total CHOL/HDL Ratio: 4
VLDL: 19.8 mg/dL (ref 0.0–40.0)

## 2015-12-18 LAB — COMPREHENSIVE METABOLIC PANEL
ALBUMIN: 3.8 g/dL (ref 3.5–5.2)
ALK PHOS: 233 U/L — AB (ref 39–117)
ALT: 18 U/L (ref 0–35)
AST: 18 U/L (ref 0–37)
BILIRUBIN TOTAL: 0.3 mg/dL (ref 0.2–1.2)
BUN: 16 mg/dL (ref 6–23)
CALCIUM: 9 mg/dL (ref 8.4–10.5)
CO2: 25 mEq/L (ref 19–32)
Chloride: 107 mEq/L (ref 96–112)
Creatinine, Ser: 0.98 mg/dL (ref 0.40–1.20)
GFR: 77.62 mL/min (ref >60.00)
GLUCOSE: 128 mg/dL — AB (ref 70–99)
POTASSIUM: 4.4 meq/L (ref 3.5–5.1)
Sodium: 139 mEq/L (ref 135–145)
TOTAL PROTEIN: 7.2 g/dL (ref 6.0–8.3)

## 2015-12-18 LAB — TSH: TSH: 2.53 u[IU]/mL (ref 0.35–4.50)

## 2015-12-19 ENCOUNTER — Encounter: Payer: Self-pay | Admitting: Internal Medicine

## 2015-12-26 ENCOUNTER — Encounter (HOSPITAL_BASED_OUTPATIENT_CLINIC_OR_DEPARTMENT_OTHER): Payer: Self-pay

## 2015-12-26 ENCOUNTER — Emergency Department (HOSPITAL_BASED_OUTPATIENT_CLINIC_OR_DEPARTMENT_OTHER)
Admission: EM | Admit: 2015-12-26 | Discharge: 2015-12-26 | Disposition: A | Discharge status: Home / Self Care | Payer: 59 | Attending: Emergency Medicine | Admitting: Emergency Medicine

## 2015-12-26 ENCOUNTER — Emergency Department (HOSPITAL_BASED_OUTPATIENT_CLINIC_OR_DEPARTMENT_OTHER): Payer: 59

## 2015-12-26 DIAGNOSIS — M79604 Pain in right leg: Secondary | ICD-10-CM | POA: Diagnosis not present

## 2015-12-26 DIAGNOSIS — E119 Type 2 diabetes mellitus without complications: Secondary | ICD-10-CM | POA: Diagnosis not present

## 2015-12-26 DIAGNOSIS — I10 Essential (primary) hypertension: Secondary | ICD-10-CM | POA: Insufficient documentation

## 2015-12-26 DIAGNOSIS — M79671 Pain in right foot: Secondary | ICD-10-CM | POA: Insufficient documentation

## 2015-12-26 DIAGNOSIS — Z79899 Other long term (current) drug therapy: Secondary | ICD-10-CM | POA: Diagnosis not present

## 2015-12-26 DIAGNOSIS — M7989 Other specified soft tissue disorders: Secondary | ICD-10-CM | POA: Diagnosis not present

## 2015-12-26 HISTORY — DX: Venous insufficiency (chronic) (peripheral): I87.2

## 2015-12-26 HISTORY — DX: Type 2 diabetes mellitus without complications: E11.9

## 2015-12-26 MED ORDER — NAPROXEN 500 MG PO TABS: 500.0000 mg | ORAL_TABLET | Freq: Two times a day (BID) | ORAL | 0 refills | Status: DC

## 2015-12-26 NOTE — ED Triage Notes (Signed)
C/o right foot pain x 1 week-denies injury-NAD-steady gait

## 2015-12-26 NOTE — ED Notes (Signed)
Patient waiting for U/s - no new complaints at this time

## 2015-12-26 NOTE — ED Provider Notes (Signed)
[facility] Provider Note   CSN: [id number] Arrival date & time: 12/26/15  1741  By signing my name below, I, Emmanuella Mensah, attest that this documentation has been prepared under the direction and in the presence of Etta Quill, NP. Electronically Signed: Judithann Sauger, ED Scribe. 12/26/15. 6:45 PM.   History   Chief Complaint Chief Complaint  Patient presents with  . Foot Pain    HPI Comments: [name] is a 50 y.o. female with a hx of DM, hypertension, and venous insufficiency who presents to the Emergency Department complaining of gradually worsening moderate pain to the dorsum of right foot with swelling onset one week ago. She reports associated mild right calf pain. No alleviating factors noted. Pt has not tried any medications PTA. She explains that she sits down for several hours while at work but denies any recent falls, injuries, or trauma to the foot. She denies a hx of PE/DVT. No fever, chills, generalized rash, or any open wounds.    The history is provided by the patient. No language interpreter was used.  Foot Pain  This is a new problem. The current episode started more than 2 days ago. The problem has been gradually worsening. Nothing aggravates the symptoms. Nothing relieves the symptoms. She has tried nothing for the symptoms. The treatment provided no relief.    Past Medical History:  Diagnosis Date  . Diabetes mellitus without complication ([place])   . Edema 04/25/2015  . Headache(784.0)   . Herniated disc   . History of colonic polyps   . Hypertension   . Obesity   . Osteoarthritis   . Shortness of breath 04/25/2015  . TIA (transient ischemic attack) 2007   hx of  . Vasculitis ([facility])    L leg - Polyarthritis  . Venous insufficiency     Patient Active Problem List   Diagnosis Date Noted  . Snoring 12/05/2015  . Arthritis 01/09/2015  . Type II diabetes mellitus with manifestations ([place]) 09/13/2014  . Hyperlipidemia with target  LDL less than 130 09/13/2014  . Dermatitis, asteototic 09/05/2014  . Alkaline phosphatase elevation 04/25/2013  . Carpal tunnel syndrome 12/30/2012  . Other screening mammogram 10/10/2012  . Migraine 08/07/2011  . Low back pain radiating to right leg 04/16/2011  . Severe obesity (BMI >= 40) ([place]) 12/25/2010  . Routine general medical examination at a health care facility 09/16/2010  . Essential hypertension, benign 02/27/2009  . OSTEOARTHRITIS 02/27/2009    Past Surgical History:  Procedure Laterality Date  . ABDOMINAL HYSTERECTOMY      OB History    No data available       Home Medications    Prior to Admission medications   Medication Sig Start Date End Date Taking? Authorizing Provider  Blood Glucose Monitoring Suppl (ONETOUCH VERIO) w/Device KIT 1 Act by Does not apply route 2 (two) times daily. 12/05/15   Janith Lima, MD  cetirizine (ZYRTEC) 10 MG tablet Take 10 mg by mouth daily as needed for allergies. Reported on 10/02/2015    Historical Provider, MD  Empagliflozin-Metformin HCl ER (SYNJARDY XR) 08-998 MG TB24 Take 2 tablets by mouth daily. 12/05/15   Janith Lima, MD  glucose blood [facility] VERIO) test strip Use BID 12/05/15   Janith Lima, MD  indapamide (LOZOL) 2.5 MG tablet Take 1 tablet (2.5 mg total) by mouth daily. 12/06/15   Janith Lima, MD  naproxen (NAPROSYN) 500 MG tablet Take 1 tablet (500 mg total) by mouth  2 (two) times daily with a meal. 01/09/15   Gregory D Calone, FNP  nebivolol (BYSTOLIC) 5 MG tablet Take 1 tablet (5 mg total) by mouth daily. 10/11/14   Thomas L Jones, MD    Family History Family History  Problem Relation Age of Onset  . Stroke Mother   . Hypertension Mother   . AAA (abdominal aortic aneurysm) Mother   . Hypertension Father   . Cancer Father     Prostate  . Arthritis Other   . Hypertension Other   . Stroke Other     Social History Social History  Substance Use Topics  . Smoking status: Never Smoker  . Smokeless  tobacco: Never Used  . Alcohol use 0.0 oz/week     Comment: occasionally     Allergies   Review of patient's allergies indicates no known allergies.   Review of Systems Review of Systems  Constitutional: Negative for chills and fever.  Musculoskeletal: Positive for arthralgias and joint swelling.  Skin: Negative for rash and wound.  All other systems reviewed and are negative.    Physical Exam Updated Vital Signs BP 141/77 (BP Location: Left Arm)   Pulse 83   Temp 98.3 F (36.8 C) (Oral)   Resp 18   Ht 5' 1" (1.549 m)   Wt (!) 302 lb (137 kg)   SpO2 100%   BMI 57.06 kg/m   Physical Exam  Constitutional: She is oriented to person, place, and time. She appears well-developed and well-nourished. No distress.  HENT:  Head: Normocephalic and atraumatic.  Eyes: Conjunctivae and EOM are normal.  Neck: Neck supple. No tracheal deviation present.  Cardiovascular: Normal rate.   Pulmonary/Chest: Effort normal. No respiratory distress.  Musculoskeletal: Normal range of motion. She exhibits tenderness.  Swollen RLE with some calf tenderness  Neurological: She is alert and oriented to person, place, and time.  Skin: Skin is warm and dry.  Psychiatric: She has a normal mood and affect. Her behavior is normal.  Nursing note and vitals reviewed.    ED Treatments / Results  DIAGNOSTIC STUDIES: Oxygen Saturation is 100% on RA, normal by my interpretation.    COORDINATION OF CARE: 6:44 PM- Pt advised of plan for treatment and pt agrees. Pt will receive US Venous of right lower leg for further evaluation.   Radiology Us Venous Img Lower Unilateral Right  Result Date: 12/26/2015 CLINICAL DATA:  Right calf pain, foot pain, swelling. EXAM: RIGHT LOWER EXTREMITY VENOUS DOPPLER ULTRASOUND TECHNIQUE: Gray-scale sonography with graded compression, as well as color Doppler and duplex ultrasound were performed to evaluate the lower extremity deep venous systems from the level of the  common femoral vein and including the common femoral, femoral, profunda femoral, popliteal and calf veins including the posterior tibial, peroneal and gastrocnemius veins when visible. The superficial great saphenous vein was also interrogated. Spectral Doppler was utilized to evaluate flow at rest and with distal augmentation maneuvers in the common femoral, femoral and popliteal veins. COMPARISON:  None. FINDINGS: Contralateral Common Femoral Vein: Respiratory phasicity is normal and symmetric with the symptomatic side. No evidence of thrombus. Normal compressibility. Common Femoral Vein: No evidence of thrombus. Normal compressibility, respiratory phasicity and response to augmentation. Saphenofemoral Junction: No evidence of thrombus. Normal compressibility and flow on color Doppler imaging. Profunda Femoral Vein: No evidence of thrombus. Normal compressibility and flow on color Doppler imaging. Femoral Vein: No evidence of thrombus. Normal compressibility, respiratory phasicity and response to augmentation. Popliteal Vein: No evidence of thrombus. Normal   compressibility, respiratory phasicity and response to augmentation. Calf Veins: No evidence of thrombus. Normal compressibility and flow on color Doppler imaging. Superficial Great Saphenous Vein: No evidence of thrombus. Normal compressibility and flow on color Doppler imaging. Other Findings:  None. IMPRESSION: No evidence of right lower extremity deep venous thrombosis. Electronically Signed   By: Jeb Levering M.D.   On: 12/26/2015 21:11    Procedures Procedures (including critical care time)  Medications Ordered in ED Medications - No data to display   Initial Impression / Assessment and Plan / ED Course  Etta Quill, NP has reviewed the triage vital signs and the nursing notes.  Pertinent labs & imaging results that were available during my care of the patient were reviewed by me and considered in my medical decision making (see chart for  details).  Clinical Course  Patient with history of venous stasis and lower right extremity edema presents with increased right ankle swelling and mild calf pain. [place] negative for DVT. Ccnservative therapy recommended and discussed. Patient will be discharged home with PCP follow-up & is agreeable with above plan. Returns precautions discussed. Pt appears safe for discharge.     Final Clinical Impressions(s) / ED Diagnoses   Final diagnoses:  Foot pain, right    New Prescriptions New Prescriptions   No medications on file   I personally performed the services described in this documentation, which was scribed in my presence. The recorded information has been reviewed and is accurate.     Etta Quill, NP 12/27/15 2202    Fredia Sorrow, MD 12/28/15 [id number]

## 2016-01-06 ENCOUNTER — Encounter: Payer: Self-pay | Admitting: Pulmonary Disease

## 2016-01-08 ENCOUNTER — Encounter: Payer: 59 | Attending: Internal Medicine | Admitting: *Deleted

## 2016-01-08 DIAGNOSIS — Z713 Dietary counseling and surveillance: Secondary | ICD-10-CM | POA: Diagnosis not present

## 2016-01-08 DIAGNOSIS — E118 Type 2 diabetes mellitus with unspecified complications: Secondary | ICD-10-CM

## 2016-01-08 NOTE — Patient Instructions (Signed)
Plan:  Aim for 4 Carb Choices per meal (60 grams) +/- 1 either way  Aim for 0-2 Carbs per snack if hungry  Include protein in moderation with your meals and snacks Consider reading food labels for Total Carbohydrate of foods Consider checking BG at alternate times per day   Consider taking medication as directed by MD

## 2016-01-08 NOTE — Progress Notes (Signed)
Diabetes Self-Management Education  Visit Type: First/Initial  Appt. Start Time: 1400 Appt. End Time: [id number]  01/08/2016  [name], identified by name and date of birth, is a 50 y.o. female with a diagnosis of Diabetes: Type 2.   ASSESSMENT  Height 5\' 1"  (1.549 m), weight (!) 302 lb 12.8 oz (137.3 kg). Body mass index is 57.21 kg/m.      Diabetes Self-Management Education - 01/08/16 1409      Visit Information   Visit Type First/Initial     Initial Visit   Diabetes Type Type 2   Are you currently following a meal plan? No   Are you taking your medications as prescribed? No  has extreme diarrhea and also the pill is very large to swallow so usually takes maybe once a day   Date Diagnosed 1 month ago     Health Coping   How would you rate your overall health? Good     Psychosocial Assessment   Patient Belief/Attitude about Diabetes Motivated to manage diabetes  afraid   Self-care barriers None   Self-management support Family;Friends;Doctor's office;Support group   Other persons present Patient   Patient Concerns Nutrition/Meal planning;Glycemic Control;Weight Control;Monitoring;Medication   Preferred Learning Style Visual;Auditory   Learning Readiness Ready   What is the last grade level you completed in school? 1 year college     Pre-Education Assessment   Patient understands the diabetes disease and treatment process. Needs Instruction   Patient understands incorporating nutritional management into lifestyle. Needs Instruction   Patient undertands incorporating physical activity into lifestyle. Needs Instruction   Patient understands using medications safely. Needs Instruction   Patient understands monitoring blood glucose, interpreting and using results Needs Instruction   Patient understands prevention, detection, and treatment of acute complications. Needs Instruction   Patient understands prevention, detection, and treatment of chronic complications.  Needs Review   Patient understands how to develop strategies to address psychosocial issues. Needs Instruction   Patient understands how to develop strategies to promote health/change behavior. Needs Instruction     Complications   Last HgB A1C per patient/outside source 8.1 %   How often do you check your blood sugar? 3-4 times / week   Have you had a dilated eye exam in the past 12 months? Yes   Have you had a dental exam in the past 12 months? No   Are you checking your feet? Yes   How many days per week are you checking your feet? 3     Dietary Intake   Breakfast oatmeal from [employer] OR sausage egg and cheese bagel OR grits and sausage   Snack (morning) not usually unless yogurt with granola and dried fruit   Lunch lunch is brought to her office OR eats at [employer] with veggie plate and maybe fish or ground beef patty OR left overs   Snack (afternoon) same as AM snack OR chips    Dinner meat, starch and vegetables OR fast food burger, occasionally with fries    Snack (evening) not unless fruit ice pop   Beverage(s) regular soda, water, unsweet tea, sweet tea occasionally     Exercise   Exercise Type ADL's  has done water aerobics in the past, walking is painful   How many days per week to you exercise? 0   How many minutes per day do you exercise? 0   Total minutes per week of exercise 0     Patient Education   Previous Diabetes Education No  Disease state  Definition of diabetes, type 1 and 2, and the diagnosis of diabetes   Nutrition management  Role of diet in the treatment of diabetes and the relationship between the three main macronutrients and blood glucose level;Food label reading, portion sizes and measuring food.;Carbohydrate counting   Physical activity and exercise  Role of exercise on diabetes management, blood pressure control and cardiac health.   Medications Reviewed patients medication for diabetes, action, purpose, timing of dose and side effects.   Monitoring  Purpose and frequency of SMBG.;Identified appropriate SMBG and/or A1C goals.   Chronic complications Relationship between chronic complications and blood glucose control   Psychosocial adjustment Role of stress on diabetes     Individualized Goals (developed by patient)   Nutrition Follow meal plan discussed   Physical Activity Exercise 3-5 times per week   Medications take my medication as prescribed   Monitoring  test blood glucose pre and post meals as discussed   Reducing Risk examine blood glucose patterns     Post-Education Assessment   Patient understands the diabetes disease and treatment process. Demonstrates understanding / competency   Patient understands incorporating nutritional management into lifestyle. Demonstrates understanding / competency   Patient undertands incorporating physical activity into lifestyle. Demonstrates understanding / competency   Patient understands using medications safely. Demonstrates understanding / competency   Patient understands monitoring blood glucose, interpreting and using results Demonstrates understanding / competency   Patient understands prevention, detection, and treatment of acute complications. Demonstrates understanding / competency   Patient understands prevention, detection, and treatment of chronic complications. Demonstrates understanding / competency   Patient understands how to develop strategies to address psychosocial issues. Demonstrates understanding / competency   Patient understands how to develop strategies to promote health/change behavior. Demonstrates understanding / competency     Outcomes   Expected Outcomes Demonstrated interest in learning. Expect positive outcomes   Future DMSE 4-6 wks   Program Status Completed      Individualized Plan for Diabetes Self-Management Training:   Learning Objective:  Patient will have a greater understanding of diabetes self-management. Patient education plan is to attend  individual and/or group sessions per assessed needs and concerns.   Plan:   Patient Instructions  Plan:  Aim for 4 Carb Choices per meal (60 grams) +/- 1 either way  Aim for 0-2 Carbs per snack if hungry  Include protein in moderation with your meals and snacks Consider reading food labels for Total Carbohydrate of foods Consider checking BG at alternate times per day   Consider taking medication as directed by MD      Expected Outcomes:  Demonstrated interest in learning. Expect positive outcomes  Education material provided: A1C conversion sheet, Meal plan card and Carbohydrate counting sheet, Log Book, Medication handout  If problems or questions, patient to contact team via:  Phone and Email  Future DSME appointment: 4-6 wks

## 2016-01-30 ENCOUNTER — Encounter: Payer: Self-pay | Admitting: Internal Medicine

## 2016-01-30 ENCOUNTER — Ambulatory Visit (INDEPENDENT_AMBULATORY_CARE_PROVIDER_SITE_OTHER): Payer: 59 | Admitting: Internal Medicine

## 2016-01-30 VITALS — BP 120/82 | HR 80 | Ht 62.0 in | Wt 299.0 lb

## 2016-01-30 DIAGNOSIS — E1165 Type 2 diabetes mellitus with hyperglycemia: Secondary | ICD-10-CM | POA: Diagnosis not present

## 2016-01-30 MED ORDER — ONETOUCH DELICA LANCETS 33G MISC: 3 refills | Status: DC

## 2016-01-30 MED ORDER — METFORMIN HCL ER 500 MG PO TB24
1000.0000 mg | ORAL_TABLET | Freq: Two times a day (BID) | ORAL | 1 refills | Status: DC
Start: 2016-01-30 — End: 2016-10-27

## 2016-01-30 NOTE — Progress Notes (Signed)
Patient ID: [name], female   DOB: 12-25-1965, 50 y.o.   MRN: [id number]   HPI: [name] is a 50 y.o.-year-old female, referred by her PCP, Dr. Ronnald Ramp, for management of DM2, dx in 09/2014, prediabetes since at least 2008,  non-insulin-dependent, uncontrolled, without complications.  Last hemoglobin A1c was: Lab Results  Component Value Date   HGBA1C 8.1 12/05/2015   HGBA1C 7.0 (H) 02/21/2015   HGBA1C 6.8 (H) 09/13/2014   Pt is on a regimen of: - Synjardy XR (metformin ER 1000 mg-Jardiance 5 mg) x 2 tablets daily - started in 11/2015 >> stopped as she had diarrhea with it   Pt is not checking sugars frequently: - am: 148 - 2h after b'fast: 138, 173 - before lunch: n/c - 2h after lunch: n/c - before dinner: 81 - 2h after dinner: n/c - bedtime: n/c - nighttime: n/c No lows. Lowest sugar was 81; ? hypoglycemia awareness Highest sugar was 173.  Glucometer: One Touch Verio  Pt's meals are: - Breakfast: sausage, egg, oatmeal, bacon + egg, OJ - Lunch: salmon/chicken + salad + veggies or a sandwich - Dinner: Print production planner, veggies - Snacks: 1-2 Regular sodas - once a day.  - no CKD, last BUN/creatinine:  Lab Results  Component Value Date   BUN 16 12/18/2015   BUN 14 04/25/2015   CREATININE 0.98 12/18/2015   CREATININE 0.81 04/25/2015  Not on an ACE inhibitor. - last set of lipids: Lab Results  Component Value Date   CHOL 161 12/18/2015   HDL 43.20 12/18/2015   LDLCALC 98 12/18/2015   TRIG 99.0 12/18/2015   CHOLHDL 4 12/18/2015  Not on a statin.  - last eye exam was in 08/21/2015. No DR.  - no numbness and tingling in her feet.  Pt has FH of DM in 2 M aunts and 1 M uncle.  ROS: Constitutional: no weight gain/loss, no fatigue, no subjective hyperthermia/hypothermia Eyes: no blurry vision, no xerophthalmia ENT: no sore throat, no nodules palpated in throat, no dysphagia/odynophagia, no hoarseness Cardiovascular: no CP/SOB/palpitations/leg  swelling Respiratory: no cough/SOB Gastrointestinal: no N/V/D/C Musculoskeletal: no muscle/joint aches Skin: no rashes Neurological: no tremors/numbness/tingling/dizziness Psychiatric: no depression/anxiety  Past Medical History:  Diagnosis Date  . Diabetes mellitus without complication ([place])   . Edema 04/25/2015  . Headache(784.0)   . Herniated disc   . History of colonic polyps   . Hypertension   . Obesity   . Osteoarthritis   . Shortness of breath 04/25/2015  . TIA (transient ischemic attack) 2007   hx of  . Vasculitis ([facility])    L leg - Polyarthritis  . Venous insufficiency    Past Surgical History:  Procedure Laterality Date  . ABDOMINAL HYSTERECTOMY     Social History   Social History  . Marital status: Single    Spouse name: N/A  . Number of children: 3   Occupational History  . Clinic scheduler for cardiology    Social History Main Topics  . Smoking status: Never Smoker  . Smokeless tobacco: Never Used  . Alcohol use 0.0 oz/week     Comment: occasionally  . Drug use: No  . Sexual activity: Yes    Birth control/ protection: Surgical   Current Outpatient Prescriptions on File Prior to Visit  Medication Sig Dispense Refill  . Blood Glucose Monitoring Suppl (ONETOUCH VERIO) w/Device KIT 1 Act by Does not apply route 2 (two) times daily. 2 kit 0  . cetirizine (ZYRTEC) 10 MG tablet Take 10  mg by mouth daily as needed for allergies. Reported on 10/02/2015    . glucose blood (ONETOUCH VERIO) test strip Use BID 100 each 12  . indapamide (LOZOL) 2.5 MG tablet Take 1 tablet (2.5 mg total) by mouth daily. 90 tablet 1  . naproxen (NAPROSYN) 500 MG tablet Take 1 tablet (500 mg total) by mouth 2 (two) times daily. 20 tablet 0  . nebivolol (BYSTOLIC) 5 MG tablet Take 1 tablet (5 mg total) by mouth daily. 90 tablet 1   No current facility-administered medications on file prior to visit.    No Known Allergies Family History  Problem Relation Age of Onset  . Stroke  Mother   . Hypertension Mother   . AAA (abdominal aortic aneurysm) Mother   . Hypertension Father   . Cancer Father     Prostate  . Arthritis Other   . Hypertension Other   . Stroke Other    PE: BP 120/82 (BP Location: Left Arm, Patient Position: Sitting)   Pulse 80   Ht 5' 2"  (1.575 m)   Wt 299 lb (135.6 kg)   SpO2 97%   BMI 54.69 kg/m  Wt Readings from Last 3 Encounters:  01/30/16 299 lb (135.6 kg)  01/08/16 (!) 302 lb 12.8 oz (137.3 kg)  12/26/15 (!) 302 lb (137 kg)   Constitutional: Obese, in NAD Eyes: PERRLA, EOMI, no exophthalmos ENT: moist mucous membranes, no thyromegaly, no cervical lymphadenopathy Cardiovascular: RRR, No MRG Respiratory: CTA B Gastrointestinal: abdomen soft, NT, ND, BS+ Musculoskeletal: no deformities, strength intact in all 4 Skin: moist, warm, + rashes: stasis dermatitis R>L Neurological: no tremor with outstretched hands, DTR normal in all 4  ASSESSMENT: 1. DM2, non-insulin-dependent, uncontrolled, without Long term complications, but with hyperglycemia  PLAN:  1. Patient with fairly recent diagnosis of diabetes, has become less well controlled in the last few months. She has been started on oral antidiabetic regimen (a combination of metformin is are and SGLT2 inhibitor), but she could not tolerate it due to diarrhea.  - She is not checking sugars frequently, but some of them are higher than target. I suggested to start metformin XR at the low dose, and advance as tolerated. I also advised her to stop regular sodas and juice. I believe that if she can tolerate the metformin, this along with the dietary changes would be enough for now. However, I would like to see her back in 1.5 months to reevaluate. - She will continue to work with nutrition - I suggested to:  Patient Instructions  Please start Metformin XR 500 mg with dinner x 4 days. If you tolerate this well, add another Metformin tablet (500 mg) with breakfast x 4 days. If you tolerate  this well, add another metformin tablet with dinner (total 1000 mg) x 4 days. If you tolerate this well, add another metformin tablet with breakfast (total 1000 mg). Continue with 1000 mg of metformin 2x a day with breakfast and dinner.  Stop Regular sodas and Juice.  Please let me know if sugars are consistently <80 or >180.  Please return in 1.5 months with your sugar log.   - Strongly advised her to start checking sugars at different times of the day - check once a day, rotating checks - given sugar log and advised how to fill it and to bring it at next appt  - given foot care handout and explained the principles  - given instructions for hypoglycemia management "15-15 rule"  - advised for yearly  eye exams >> she is up to date - Return to clinic in 1.5 mo with sugar log   Philemon Kingdom, MD PhD [facility] End[facility]

## 2016-01-30 NOTE — Patient Instructions (Signed)
Please start Metformin XR 500 mg with dinner x 4 days. If you tolerate this well, add another Metformin tablet (500 mg) with breakfast x 4 days. If you tolerate this well, add another metformin tablet with dinner (total 1000 mg) x 4 days. If you tolerate this well, add another metformin tablet with breakfast (total 1000 mg). Continue with 1000 mg of metformin 2x a day with breakfast and dinner.  Stop Regular sodas and Juice.  Please let me know if sugars are consistently <80 or >180.  Please return in 1.5 months with your sugar log.   PATIENT INSTRUCTIONS FOR TYPE 2 DIABETES:  **Please join MyChart!** - see attached instructions about how to join if you have not done so already.  DIET AND EXERCISE Diet and exercise is an important part of diabetic treatment.  We recommended aerobic exercise in the form of brisk walking (working between 40-60% of maximal aerobic capacity, similar to brisk walking) for 150 minutes per week (such as 30 minutes five days per week) along with 3 times per week performing 'resistance' training (using various gauge rubber tubes with handles) 5-10 exercises involving the major muscle groups (upper body, lower body and core) performing 10-15 repetitions (or near fatigue) each exercise. Start at half the above goal but build slowly to reach the above goals. If limited by weight, joint pain, or disability, we recommend daily walking in a swimming pool with water up to waist to reduce pressure from joints while allow for adequate exercise.    BLOOD GLUCOSES Monitoring your blood glucoses is important for continued management of your diabetes. Please check your blood glucoses 2-4 times a day: fasting, before meals and at bedtime (you can rotate these measurements - e.g. one day check before the 3 meals, the next day check before 2 of the meals and before bedtime, etc.).   HYPOGLYCEMIA (low blood sugar) Hypoglycemia is usually a reaction to not eating, exercising, or taking too  much insulin/ other diabetes drugs.  Symptoms include tremors, sweating, hunger, confusion, headache, etc. Treat IMMEDIATELY with 15 grams of Carbs: . 4 glucose tablets .  cup regular juice/soda . 2 tablespoons raisins . 4 teaspoons sugar . 1 tablespoon honey Recheck blood glucose in 15 mins and repeat above if still symptomatic/blood glucose <100.  RECOMMENDATIONS TO REDUCE YOUR RISK OF DIABETIC COMPLICATIONS: * Take your prescribed MEDICATION(S) * Follow a DIABETIC diet: Complex carbs, fiber rich foods, (monounsaturated and polyunsaturated) fats * AVOID saturated/trans fats, high fat foods, >2,300 mg salt per day. * EXERCISE at least 5 times a week for 30 minutes or preferably daily.  * DO NOT SMOKE OR DRINK more than 1 drink a day. * Check your FEET every day. Do not wear tightfitting shoes. Contact us if you develop an ulcer * See your EYE doctor once a year or more if needed * Get a FLU shot once a year * Get a PNEUMONIA vaccine once before and once after age 57 years  GOALS:  * Your Hemoglobin A1c of <7%  * fasting sugars need to be <130 * after meals sugars need to be <180 (2h after you start eating) * Your Systolic BP should be [id number] or lower  * Your Diastolic BP should be 80 or lower  * Your HDL (Good Cholesterol) should be 40 or higher  * Your LDL (Bad Cholesterol) should be 100 or lower. * Your Triglycerides should be 150 or lower  * Your Urine microalbumin (kidney function) should be <30 * Your  Body Mass Index should be 25 or lower    Please consider the following ways to cut down carbs and fat and increase fiber and micronutrients in your diet: - substitute whole grain for white bread or pasta - substitute brown rice for white rice - substitute 90-calorie flat bread pieces for slices of bread when possible - substitute sweet potatoes or yams for white potatoes - substitute humus for margarine - substitute tofu for cheese when possible - substitute almond or rice  milk for regular milk (would not drink soy milk daily due to concern for soy estrogen influence on breast cancer risk) - substitute dark chocolate for other sweets when possible - substitute water - can add lemon or orange slices for taste - for diet sodas (artificial sweeteners will trick your body that you can eat sweets without getting calories and will lead you to overeating and weight gain in the long run) - do not skip breakfast or other meals (this will slow down the metabolism and will result in more weight gain over time)  - can try smoothies made from fruit and almond/rice milk in am instead of regular breakfast - can also try old-fashioned (not instant) oatmeal made with almond/rice milk in am - order the dressing on the side when eating salad at a restaurant (pour less than half of the dressing on the salad) - eat as little meat as possible - can try juicing, but should not forget that juicing will get rid of the fiber, so would alternate with eating raw veg./fruits or drinking smoothies - use as little oil as possible, even when using olive oil - can dress a salad with a mix of balsamic vinegar and lemon juice, for e.g. - use agave nectar, stevia sugar, or regular sugar rather than artificial sweateners - steam or broil/roast veggies  - snack on veggies/fruit/nuts (unsalted, preferably) when possible, rather than processed foods - reduce or eliminate aspartame in diet (it is in diet sodas, chewing gum, etc) Read the labels!  Try to read Dr. Janene Harvey book: "Program for Reversing Diabetes" for other ideas for healthy eating.

## 2016-02-06 ENCOUNTER — Institutional Professional Consult (permissible substitution): Payer: 59 | Admitting: Pulmonary Disease

## 2016-02-10 ENCOUNTER — Encounter: Payer: Self-pay | Admitting: Internal Medicine

## 2016-02-18 ENCOUNTER — Ambulatory Visit: Payer: 59 | Admitting: *Deleted

## 2016-02-20 ENCOUNTER — Other Ambulatory Visit: Payer: Self-pay | Admitting: Internal Medicine

## 2016-02-20 DIAGNOSIS — I10 Essential (primary) hypertension: Secondary | ICD-10-CM

## 2016-02-24 ENCOUNTER — Institutional Professional Consult (permissible substitution): Payer: 59 | Admitting: Pulmonary Disease

## 2016-02-26 ENCOUNTER — Encounter: Payer: Self-pay | Admitting: Internal Medicine

## 2016-02-26 NOTE — Telephone Encounter (Signed)
#   work [id number] pt has been having headaches for 3-4 days

## 2016-02-26 NOTE — Telephone Encounter (Signed)
Pt has taken the metfomin  Yesterday felt nausea, dizzy, eye sight is blurry, just didn't feel well her BS yesterday AM 168 fasting, 2:11 pm 136  Just now its 195 two hours after breakfast and she does feel better today than she did yesterday

## 2016-03-16 ENCOUNTER — Encounter: Payer: Self-pay | Admitting: Internal Medicine

## 2016-03-18 ENCOUNTER — Ambulatory Visit: Payer: 59 | Admitting: *Deleted

## 2016-03-24 ENCOUNTER — Ambulatory Visit (INDEPENDENT_AMBULATORY_CARE_PROVIDER_SITE_OTHER): Payer: 59 | Admitting: Internal Medicine

## 2016-03-24 ENCOUNTER — Other Ambulatory Visit (INDEPENDENT_AMBULATORY_CARE_PROVIDER_SITE_OTHER): Payer: 59

## 2016-03-24 ENCOUNTER — Encounter: Payer: Self-pay | Admitting: Internal Medicine

## 2016-03-24 VITALS — BP 118/76 | HR 71 | Temp 98.6°F | Resp 12 | Ht 62.0 in | Wt 288.8 lb

## 2016-03-24 DIAGNOSIS — I10 Essential (primary) hypertension: Secondary | ICD-10-CM

## 2016-03-24 DIAGNOSIS — R748 Abnormal levels of other serum enzymes: Secondary | ICD-10-CM | POA: Diagnosis not present

## 2016-03-24 DIAGNOSIS — E1165 Type 2 diabetes mellitus with hyperglycemia: Secondary | ICD-10-CM

## 2016-03-24 DIAGNOSIS — Z01419 Encounter for gynecological examination (general) (routine) without abnormal findings: Secondary | ICD-10-CM | POA: Diagnosis not present

## 2016-03-24 DIAGNOSIS — R0683 Snoring: Secondary | ICD-10-CM | POA: Diagnosis not present

## 2016-03-24 DIAGNOSIS — Z1231 Encounter for screening mammogram for malignant neoplasm of breast: Secondary | ICD-10-CM | POA: Diagnosis not present

## 2016-03-24 DIAGNOSIS — Z68.41 Body mass index (BMI) pediatric, greater than or equal to 95th percentile for age: Secondary | ICD-10-CM | POA: Diagnosis not present

## 2016-03-24 NOTE — Progress Notes (Signed)
Pre visit review using our clinic review tool, if applicable. No additional management support is needed unless otherwise documented below in the visit note. 

## 2016-03-24 NOTE — Progress Notes (Signed)
Subjective:  Patient ID: [name], female    DOB: 10/17/1965  Age: 50 y.o. MRN: [id number]  CC: Hypertension and Diabetes   HPI [name] presents for follow-up on hypertension and diabetes. Since I last saw her she has lost weight with diet and exercise. She feels well today and offers no complaints. She has a history of snoring and possible sleep apnea and has not had her sleep study completed yet.  Outpatient Medications Prior to Visit  Medication Sig Dispense Refill  . Blood Glucose Monitoring Suppl (ONETOUCH VERIO) w/Device KIT 1 Act by Does not apply route 2 (two) times daily. 2 kit 0  . cetirizine (ZYRTEC) 10 MG tablet Take 10 mg by mouth daily as needed for allergies. Reported on 10/02/2015    . glucose blood (ONETOUCH VERIO) test strip Use BID 100 each 12  . indapamide (LOZOL) 2.5 MG tablet Take 1 tablet (2.5 mg total) by mouth daily. 90 tablet 1  . metFORMIN (GLUCOPHAGE-XR) 500 MG 24 hr tablet Take 2 tablets (1,000 mg total) by mouth 2 (two) times daily with a meal. 120 tablet 1  . naproxen (NAPROSYN) 500 MG tablet Take 1 tablet (500 mg total) by mouth 2 (two) times daily. 20 tablet 0  . nebivolol (BYSTOLIC) 5 MG tablet Take 1 tablet (5 mg total) by mouth daily. 90 tablet 1  . ONETOUCH DELICA LANCETS 18H MISC Use 1x a day 100 each 3   No facility-administered medications prior to visit.     ROS Review of Systems  Constitutional: Negative for appetite change, diaphoresis, fatigue and unexpected weight change.  HENT: Negative.   Eyes: Negative for visual disturbance.  Respiratory: Negative for cough, chest tightness, shortness of breath, wheezing and stridor.   Cardiovascular: Negative for chest pain, palpitations and leg swelling.  Gastrointestinal: Negative for abdominal pain, constipation, diarrhea, nausea and vomiting.  Endocrine: Negative for cold intolerance, heat intolerance, polydipsia, polyphagia and polyuria.  Genitourinary: Negative.     Musculoskeletal: Negative for back pain, myalgias and neck pain.  Skin: Negative.   Allergic/Immunologic: Negative.   Neurological: Negative.  Negative for dizziness.  Hematological: Negative.  Does not bruise/bleed easily.  Psychiatric/Behavioral: Negative.     Objective:  BP 118/76   Pulse 71   Temp 98.6 F (37 C) (Oral)   Resp 12   Ht _0  (1.575 m)   Wt 288 lb 12.8 oz (131 kg)   SpO2 98%   BMI 52.82 kg/m   BP Readings from Last 3 Encounters:  03/26/16 114/74  03/24/16 118/76  01/30/16 120/82    Wt Readings from Last 3 Encounters:  03/26/16 287 lb (130.2 kg)  03/24/16 288 lb 12.8 oz (131 kg)  01/30/16 299 lb (135.6 kg)    Physical Exam  Constitutional: She is oriented to person, place, and time. She appears well-developed and well-nourished. No distress.  HENT:  Mouth/Throat: Oropharynx is clear and moist. No oropharyngeal exudate.  Eyes: Conjunctivae are normal. Right eye exhibits no discharge. Left eye exhibits no discharge. No scleral icterus.  Neck: Normal range of motion. Neck supple. No JVD present. No tracheal deviation present. No thyromegaly present.  Cardiovascular: Normal rate, regular rhythm, normal heart sounds and intact distal pulses.  Exam reveals no gallop and no friction rub.   No murmur heard. Pulmonary/Chest: Effort normal and breath sounds normal. No stridor. No respiratory distress. She has no wheezes. She has no rales. She exhibits no tenderness.  Abdominal: Soft. Bowel sounds are normal. She  exhibits no distension and no mass. There is no tenderness. There is no rebound and no guarding.  Musculoskeletal: Normal range of motion. She exhibits no edema, tenderness or deformity.  Lymphadenopathy:    She has no cervical adenopathy.  Neurological: She is oriented to person, place, and time.  Skin: Skin is warm. No rash noted. She is not diaphoretic. No erythema. No pallor.  Vitals reviewed.   Lab Results  Component Value Date   WBC 8.9  12/18/2015   HGB 12.2 12/18/2015   HCT 36.5 12/18/2015   PLT 316.0 12/18/2015   GLUCOSE 106 (H) 03/24/2016   CHOL 161 12/18/2015   TRIG 99.0 12/18/2015   HDL 43.20 12/18/2015   LDLCALC 98 12/18/2015   ALT 22 03/24/2016   AST 19 03/24/2016   NA 139 03/24/2016   K 3.7 03/24/2016   CL 98 03/24/2016   CREATININE 1.01 03/24/2016   BUN 17 03/24/2016   CO2 31 03/24/2016   TSH 2.53 12/18/2015   INR 1.0 05/25/2007   HGBA1C 7.1 (H) 03/24/2016   MICROALBUR 1.5 12/05/2015    US Venous Img Lower Unilateral Right  Result Date: 12/26/2015 CLINICAL DATA:  Right calf pain, foot pain, swelling. EXAM: RIGHT LOWER EXTREMITY VENOUS DOPPLER ULTRASOUND TECHNIQUE: Gray-scale sonography with graded compression, as well as color Doppler and duplex ultrasound were performed to evaluate the lower extremity deep venous systems from the level of the common femoral vein and including the common femoral, femoral, profunda femoral, popliteal and calf veins including the posterior tibial, peroneal and gastrocnemius veins when visible. The superficial great saphenous vein was also interrogated. Spectral Doppler was utilized to evaluate flow at rest and with distal augmentation maneuvers in the common femoral, femoral and popliteal veins. COMPARISON:  None. FINDINGS: Contralateral Common Femoral Vein: Respiratory phasicity is normal and symmetric with the symptomatic side. No evidence of thrombus. Normal compressibility. Common Femoral Vein: No evidence of thrombus. Normal compressibility, respiratory phasicity and response to augmentation. Saphenofemoral Junction: No evidence of thrombus. Normal compressibility and flow on color Doppler imaging. Profunda Femoral Vein: No evidence of thrombus. Normal compressibility and flow on color Doppler imaging. Femoral Vein: No evidence of thrombus. Normal compressibility, respiratory phasicity and response to augmentation. Popliteal Vein: No evidence of thrombus. Normal compressibility,  respiratory phasicity and response to augmentation. Calf Veins: No evidence of thrombus. Normal compressibility and flow on color Doppler imaging. Superficial Great Saphenous Vein: No evidence of thrombus. Normal compressibility and flow on color Doppler imaging. Other Findings:  None. IMPRESSION: No evidence of right lower extremity deep venous thrombosis. Electronically Signed   By: Jeb Levering M.D.   On: 12/26/2015 21:11    Assessment & Plan:   Thursa was seen today for hypertension and diabetes.  Diagnoses and all orders for this visit:  Type 2 diabetes mellitus with hyperglycemia, without long-term current use of insulin ([place])- her A1c is down to 7.1%, she was praised for her lifestyle modifications, her blood sugars are well-controlled, will continue metformin at the current dose. -     Comprehensive metabolic panel; Future -     Hemoglobin A1c; Future  Essential hypertension, benign- her blood pressure is well-controlled on the beta blocker and thiazide diuretic, her electrolytes and renal function are normal. -     Comprehensive metabolic panel; Future  Alkaline phosphatase elevation- this is stable, her other liver enzymes are normal, this is most likely related to the hyperglycemia diabetes. -     Comprehensive metabolic panel; Future  Snoring -  Ambulatory referral to Pulmonology   I am having [name] maintain her cetirizine, glucose blood, ONETOUCH VERIO, indapamide, naproxen, metFORMIN, ONETOUCH DELICA LANCETS 92J, and nebivolol.  No orders of the defined types were placed in this encounter.    Follow-up: Return in about 6 months (around 09/22/2016).  Scarlette Calico, MD

## 2016-03-24 NOTE — Patient Instructions (Signed)
Hypertension Hypertension, commonly called high blood pressure, is when the force of blood pumping through your arteries is too strong. Your arteries are the blood vessels that carry blood from your heart throughout your body. A blood pressure reading consists of a higher number over a lower number, such as 110/72. The higher number (systolic) is the pressure inside your arteries when your heart pumps. The lower number (diastolic) is the pressure inside your arteries when your heart relaxes. Ideally you want your blood pressure below 120/80. Hypertension forces your heart to work harder to pump blood. Your arteries may become narrow or stiff. Having untreated or uncontrolled hypertension can cause heart attack, stroke, kidney disease, and other problems. What increases the risk? Some risk factors for high blood pressure are controllable. Others are not. Risk factors you cannot control include:  Race. You may be at higher risk if you are African American.  Age. Risk increases with age.  Gender. Men are at higher risk than women before age 45 years. After age 65, women are at higher risk than men. Risk factors you can control include:  Not getting enough exercise or physical activity.  Being overweight.  Getting too much fat, sugar, calories, or salt in your diet.  Drinking too much alcohol. What are the signs or symptoms? Hypertension does not usually cause signs or symptoms. Extremely high blood pressure (hypertensive crisis) may cause headache, anxiety, shortness of breath, and nosebleed. How is this diagnosed? To check if you have hypertension, your health care provider will measure your blood pressure while you are seated, with your arm held at the level of your heart. It should be measured at least twice using the same arm. Certain conditions can cause a difference in blood pressure between your right and left arms. A blood pressure reading that is higher than normal on one occasion does  not mean that you need treatment. If it is not clear whether you have high blood pressure, you may be asked to return on a different day to have your blood pressure checked again. Or, you may be asked to monitor your blood pressure at home for 1 or more weeks. How is this treated? Treating high blood pressure includes making lifestyle changes and possibly taking medicine. Living a healthy lifestyle can help lower high blood pressure. You may need to change some of your habits. Lifestyle changes may include:  Following the DASH diet. This diet is high in fruits, vegetables, and whole grains. It is low in salt, red meat, and added sugars.  Keep your sodium intake below 2,300 mg per day.  Getting at least 30-45 minutes of aerobic exercise at least 4 times per week.  Losing weight if necessary.  Not smoking.  Limiting alcoholic beverages.  Learning ways to reduce stress. Your health care provider may prescribe medicine if lifestyle changes are not enough to get your blood pressure under control, and if one of the following is true:  You are 18-59 years of age and your systolic blood pressure is above 140.  You are 60 years of age or older, and your systolic blood pressure is above 150.  Your diastolic blood pressure is above 90.  You have diabetes, and your systolic blood pressure is over 140 or your diastolic blood pressure is over 90.  You have kidney disease and your blood pressure is above 140/90.  You have heart disease and your blood pressure is above 140/90. Your personal target blood pressure may vary depending on your medical   conditions, your age, and other factors. Follow these instructions at home:  Have your blood pressure rechecked as directed by your health care provider.  Take medicines only as directed by your health care provider. Follow the directions carefully. Blood pressure medicines must be taken as prescribed. The medicine does not work as well when you skip  doses. Skipping doses also puts you at risk for problems.  Do not smoke.  Monitor your blood pressure at home as directed by your health care provider. Contact a health care provider if:  You think you are having a reaction to medicines taken.  You have recurrent headaches or feel dizzy.  You have swelling in your ankles.  You have trouble with your vision. Get help right away if:  You develop a severe headache or confusion.  You have unusual weakness, numbness, or feel faint.  You have severe chest or abdominal pain.  You vomit repeatedly.  You have trouble breathing. This information is not intended to replace advice given to you by your health care provider. Make sure you discuss any questions you have with your health care provider. Document Released: 03/30/2005 Document Revised: 09/05/2015 Document Reviewed: 01/20/2013 Elsevier Interactive Patient Education  2017 Elsevier Inc.  

## 2016-03-25 ENCOUNTER — Encounter: Payer: Self-pay | Admitting: Internal Medicine

## 2016-03-25 LAB — COMPREHENSIVE METABOLIC PANEL
ALK PHOS: 307 U/L — AB (ref 39–117)
ALT: 22 U/L (ref 0–35)
AST: 19 U/L (ref 0–37)
Albumin: 4.1 g/dL (ref 3.5–5.2)
BILIRUBIN TOTAL: 0.2 mg/dL (ref 0.2–1.2)
BUN: 17 mg/dL (ref 6–23)
CO2: 31 meq/L (ref 19–32)
CREATININE: 1.01 mg/dL (ref 0.40–1.20)
Calcium: 9.9 mg/dL (ref 8.4–10.5)
Chloride: 98 mEq/L (ref 96–112)
GFR: 74.88 mL/min (ref >60.00)
GLUCOSE: 106 mg/dL — AB (ref 70–99)
Potassium: 3.7 mEq/L (ref 3.5–5.1)
SODIUM: 139 meq/L (ref 135–145)
TOTAL PROTEIN: 8 g/dL (ref 6.0–8.3)

## 2016-03-25 LAB — HEMOGLOBIN A1C: Hgb A1c MFr Bld: 7.1 % — ABNORMAL HIGH (ref 4.6–6.5)

## 2016-03-26 ENCOUNTER — Encounter: Payer: Self-pay | Admitting: Internal Medicine

## 2016-03-26 ENCOUNTER — Ambulatory Visit (INDEPENDENT_AMBULATORY_CARE_PROVIDER_SITE_OTHER): Payer: 59 | Admitting: Internal Medicine

## 2016-03-26 ENCOUNTER — Ambulatory Visit: Payer: 59 | Admitting: Internal Medicine

## 2016-03-26 VITALS — BP 114/74 | HR 84 | Ht 62.0 in | Wt 287.0 lb

## 2016-03-26 DIAGNOSIS — E1165 Type 2 diabetes mellitus with hyperglycemia: Secondary | ICD-10-CM

## 2016-03-26 MED ORDER — SITAGLIPTIN PHOSPHATE 100 MG PO TABS: 100.0000 mg | ORAL_TABLET | Freq: Every day | ORAL | 3 refills | Status: DC

## 2016-03-26 NOTE — Progress Notes (Addendum)
Patient ID: [name], female   DOB: Aug 14, 1965, 50 y.o.   MRN: [id number]   HPI: [name] is a 50 y.o.-year-old female, returning for f/u for DM2, dx in 09/2014, prediabetes since at least 2008,  non-insulin-dependent, uncontrolled, without complications. Last visit 2 mo ago.  Last hemoglobin A1c was: Lab Results  Component Value Date   HGBA1C 7.1 (H) 03/24/2016   HGBA1C 8.1 12/05/2015   HGBA1C 7.0 (H) 02/21/2015   Pt was on: - Synjardy XR (metformin ER 1000 mg-Jardiance 5 mg) x 2 tablets daily - started in 11/2015 >> stopped as she had diarrhea with it   Now on: - Metformin ER 1000 mg 2x a day >> still some diarrhea with 2 tabs  Pt checks sugars 2x a day: - am: 148 >> 70 x1, 105-141, 151 - 2h after b'fast: 138, 173, 237, 239 - before lunch: n/c >> 96-178 - 2h after lunch: n/c >> 92-171, 182 - before dinner: 81 >> 140, 151 - 2h after dinner: n/c  - bedtime: n/c - nighttime: n/c No lows. Lowest sugar was 81 >> 70; ? hypoglycemia awareness Highest sugar was 173 >> 230.  Glucometer: One Touch Verio  Pt's meals are: - Breakfast: sausage, egg, oatmeal, bacon + egg, OJ - Lunch: salmon/chicken + salad + veggies or a sandwich - Dinner: Print production planner, veggies - Snacks: 1-2 Regular sodas - less now. No OJ. Drinks water, unsweet tea.  - no CKD, last BUN/creatinine:  Lab Results  Component Value Date   BUN 17 03/24/2016   BUN 16 12/18/2015   CREATININE 1.01 03/24/2016   CREATININE 0.98 12/18/2015  Not on an ACE inhibitor. - last set of lipids: Lab Results  Component Value Date   CHOL 161 12/18/2015   HDL 43.20 12/18/2015   LDLCALC 98 12/18/2015   TRIG 99.0 12/18/2015   CHOLHDL 4 12/18/2015  Not on a statin.  - last eye exam was in 08/21/2015. No DR.  - no numbness and tingling in her feet.  ROS: Constitutional: no weight gain/loss, no fatigue, no subjective hyperthermia/hypothermia Eyes: no blurry vision, no xerophthalmia ENT: no sore throat, no  nodules palpated in throat, no dysphagia/odynophagia, no hoarseness Cardiovascular: no CP/SOB/palpitations/leg swelling Respiratory: no cough/SOB Gastrointestinal: no N/V/D/C Musculoskeletal: no muscle/joint aches Skin: no rashes Neurological: no tremors/numbness/tingling/dizziness Psychiatric: no depression/anxiety  Past Medical History:  Diagnosis Date  . Diabetes mellitus without complication ([place])   . Edema 04/25/2015  . Headache(784.0)   . Herniated disc   . History of colonic polyps   . Hypertension   . Obesity   . Osteoarthritis   . Shortness of breath 04/25/2015  . TIA (transient ischemic attack) 2007   hx of  . Vasculitis ([facility])    L leg - Polyarthritis  . Venous insufficiency    Past Surgical History:  Procedure Laterality Date  . ABDOMINAL HYSTERECTOMY     Social History   Social History  . Marital status: Single    Spouse name: N/A  . Number of children: 3   Occupational History  . Clinic scheduler for cardiology    Social History Main Topics  . Smoking status: Never Smoker  . Smokeless tobacco: Never Used  . Alcohol use 0.0 oz/week     Comment: occasionally  . Drug use: No  . Sexual activity: Yes    Birth control/ protection: Surgical   Current Outpatient Prescriptions on File Prior to Visit  Medication Sig Dispense Refill  . Blood Glucose Monitoring Suppl Brand [facility]  VERIO) w/Device KIT 1 Act by Does not apply route 2 (two) times daily. 2 kit 0  . cetirizine (ZYRTEC) 10 MG tablet Take 10 mg by mouth daily as needed for allergies. Reported on 10/02/2015    . glucose blood (ONETOUCH VERIO) test strip Use BID 100 each 12  . indapamide (LOZOL) 2.5 MG tablet Take 1 tablet (2.5 mg total) by mouth daily. 90 tablet 1  . metFORMIN (GLUCOPHAGE-XR) 500 MG 24 hr tablet Take 2 tablets (1,000 mg total) by mouth 2 (two) times daily with a meal. 120 tablet 1  . naproxen (NAPROSYN) 500 MG tablet Take 1 tablet (500 mg total) by mouth 2 (two) times daily. 20 tablet 0  .  nebivolol (BYSTOLIC) 5 MG tablet Take 1 tablet (5 mg total) by mouth daily. 90 tablet 1  . ONETOUCH DELICA LANCETS 56O MISC Use 1x a day 100 each 3   No current facility-administered medications on file prior to visit.    No Known Allergies Family History  Problem Relation Age of Onset  . Stroke Mother   . Hypertension Mother   . AAA (abdominal aortic aneurysm) Mother   . Hypertension Father   . Cancer Father     Prostate  . Arthritis Other   . Hypertension Other   . Stroke Other    PE: BP 114/74 (BP Location: Right Arm, Patient Position: Sitting, Cuff Size: Large)   Pulse 84   Ht 5' 2"  (1.575 m)   Wt 287 lb (130.2 kg)   SpO2 97%   BMI 52.49 kg/m  Wt Readings from Last 3 Encounters:  03/26/16 287 lb (130.2 kg)  03/24/16 288 lb 12.8 oz (131 kg)  01/30/16 299 lb (135.6 kg)   Constitutional: Obese, in NAD Eyes: PERRLA, EOMI, no exophthalmos ENT: moist mucous membranes, no thyromegaly, no cervical lymphadenopathy Cardiovascular: RRR, No MRG Respiratory: CTA B Gastrointestinal: abdomen soft, NT, ND, BS+ Musculoskeletal: no deformities, strength intact in all 4 Skin: moist, warm, + rashes: stasis dermatitis R>L Neurological: no tremor with outstretched hands, DTR normal in all 4  ASSESSMENT: 1. DM2, non-insulin-dependent, uncontrolled, without Long term complications, but with hyperglycemia  PLAN:  1. Patient with fairly recent diagnosis of diabetes, has become less well controlled in the last few months. We stopped Synjardy but she could not tolerate it due to diarrhea. At last visit, we started Metformin ER. She is doing well on this but has diarrhea with 2 tabs at a time >> will split to 500 mg 3x a day - will also add Januvia as sugars are still high after meals - advised her to start checking at bedtime, also - She will continue to work with nutrition - discussed about the [place] CGM >> will check with insurance if covered - she lost weight since last visit (12 lbs!)  >> congratulated her. Also, HbA1c few days ago is better: 7.1%! - I suggested to:  Patient Instructions  Please check with your insurance if the Free Style Libre CGM.  Please continue Metformin ER, but change to 500 mg 3x a day.  Add Januvia 100 mg in am, before b'fast.  Please return in 3 months with your sugar log.   - continue checking sugars at different times of the day - check once a day, rotating checks - advised for yearly eye exams >> she is up to date - Return to clinic in 3 mo with sugar log   Philemon Kingdom, MD PhD [facility]

## 2016-03-26 NOTE — Patient Instructions (Addendum)
Please check with your insurance if the Free Style Libre CGM.  Please continue Metformin ER, but change to 500 mg 3x a day.  Add Januvia 100 mg in am, before b'fast.  Please return in 3 months with your sugar log.

## 2016-04-14 ENCOUNTER — Ambulatory Visit: Payer: 59 | Admitting: *Deleted

## 2016-04-21 ENCOUNTER — Encounter: Payer: Self-pay | Admitting: Internal Medicine

## 2016-05-06 ENCOUNTER — Institutional Professional Consult (permissible substitution): Payer: 59 | Admitting: Pulmonary Disease

## 2016-05-19 ENCOUNTER — Ambulatory Visit: Payer: 59 | Admitting: *Deleted

## 2016-05-20 ENCOUNTER — Encounter: Payer: Self-pay | Admitting: Internal Medicine

## 2016-05-22 NOTE — Telephone Encounter (Signed)
Will write letter with your approval.

## 2016-06-10 ENCOUNTER — Encounter: Payer: 59 | Attending: Family Medicine | Admitting: *Deleted

## 2016-06-10 DIAGNOSIS — I10 Essential (primary) hypertension: Secondary | ICD-10-CM | POA: Diagnosis not present

## 2016-06-10 DIAGNOSIS — E1169 Type 2 diabetes mellitus with other specified complication: Secondary | ICD-10-CM | POA: Diagnosis not present

## 2016-06-10 DIAGNOSIS — E669 Obesity, unspecified: Secondary | ICD-10-CM | POA: Diagnosis not present

## 2016-06-25 ENCOUNTER — Other Ambulatory Visit (HOSPITAL_COMMUNITY): Payer: Self-pay | Admitting: General Surgery

## 2016-06-26 ENCOUNTER — Ambulatory Visit: Payer: 59 | Admitting: Internal Medicine

## 2016-07-02 ENCOUNTER — Ambulatory Visit (INDEPENDENT_AMBULATORY_CARE_PROVIDER_SITE_OTHER): Payer: 59 | Admitting: Psychology

## 2016-07-06 ENCOUNTER — Encounter: Payer: 59 | Attending: Family Medicine | Admitting: Skilled Nursing Facility1

## 2016-07-06 ENCOUNTER — Encounter: Payer: Self-pay | Admitting: Skilled Nursing Facility1

## 2016-07-06 DIAGNOSIS — Z79899 Other long term (current) drug therapy: Secondary | ICD-10-CM | POA: Diagnosis not present

## 2016-07-06 DIAGNOSIS — Z713 Dietary counseling and surveillance: Secondary | ICD-10-CM | POA: Insufficient documentation

## 2016-07-06 DIAGNOSIS — Z6841 Body Mass Index (BMI) 40.0 and over, adult: Secondary | ICD-10-CM | POA: Diagnosis not present

## 2016-07-06 DIAGNOSIS — Z7984 Long term (current) use of oral hypoglycemic drugs: Secondary | ICD-10-CM | POA: Diagnosis not present

## 2016-07-06 DIAGNOSIS — E1165 Type 2 diabetes mellitus with hyperglycemia: Secondary | ICD-10-CM

## 2016-07-06 DIAGNOSIS — E119 Type 2 diabetes mellitus without complications: Secondary | ICD-10-CM | POA: Insufficient documentation

## 2016-07-06 DIAGNOSIS — I10 Essential (primary) hypertension: Secondary | ICD-10-CM | POA: Diagnosis not present

## 2016-07-06 NOTE — Progress Notes (Signed)
Pre-Op Assessment Visit:  Pre-Operative Sleeve Gastrectomy Surgery  Medical Nutrition Therapy:  Appt start time: 3:16 End time:  4:00  Patient was seen on 07/06/2016 for Pre-Operative Nutrition Assessment. Assessment and letter of approval faxed to [facility] Bariatric Surgery Program coordinator on 07/06/2016.  Pt states she was diagnosed with diabetes August of last year. Pt states she usual gets from 120-230 for blood sugars, highest being 272. Pt states she wants to get back into water aerobics.   Start weight at [facility]: 292.3 poudns BMI: 54.99   24 hr Dietary Recall: First Meal: egg sandwich or grits and eggs Snack:  Second Meal: skipped Snack: chips Third Meal: fast food Snack:  Beverages: soda, water  Encouraged to engage in 150 minutes of moderate physical activity including cardiovascular and weight baring weekly  Handouts given during visit include:  . Pre-Op Goals . Bariatric Surgery Protein Shakes During the appointment today the following Pre-Op Goals were reviewed with the patient: . Maintain or lose weight as instructed by your surgeon . Make healthy food choices . Begin to limit portion sizes . Limited concentrated sugars and fried foods . Keep fat/sugar in the single digits per serving on          food labels . Practice CHEWING your food  (aim for 30 chews per bite or until applesauce consistency) . Practice not drinking 15 minutes before, during, and 30 minutes after each meal/snack . Avoid all carbonated beverages  . Avoid/limit caffeinated beverages  . Avoid all sugar-sweetened beverages . Consume 3 meals per day; eat every 3-5 hours . Make a list of non-food related activities . Aim for 64-100 ounces of FLUID daily  . Aim for at least 60-80 grams of PROTEIN daily . Look for a liquid protein source that contain ?15 g protein and ?5 g carbohydrate  (ex: shakes, drinks, shots)  -Follow diet recommendations listed below   Energy and  Macronutrient Recomendations: Calories: 1500 Carbohydrate: 170 Protein: 112 Fat: 42  Demonstrated degree of understanding via:  Teach Back  Teaching Method Utilized:  Visual Auditory Hands on  Barriers to learning/adherence to lifestyle change: none identified   Patient to call the [facility] to enroll in Pre-Op and Post-Op Nutrition Education when surgery date is scheduled.

## 2016-07-06 NOTE — Patient Instructions (Signed)
Follow Pre-Op Goals Try Protein Shakes Call [facility] at [phone] when surgery is scheduled to enroll in Pre-Op Class  Things to remember:  Please always be honest with [place]. We want to support you!  If you have any questions or concerns in between appointments, please call or email   The diet after surgery will be high protein and low in carbohydrate.  Vitamins and calcium need to be taken for the rest of your life.  Feel free to include support people in any classes or appointments.  Below 70 is low  If low: half can of soda, 4oz of choose, a couple plain crackers then rest for 15 minutes and check again repeat if still below 70 have some energy source and protein if above 70  If over 200 go for a walk or drink t least 16 oz of water   Supplement recommendations:  Before Surgery   1 Complete Multivitamin with Iron  3000 IU Vitamin D3  After Surgery   2 Chewable Multivitamins  **Best Choice - Opurity: Bypass and Sleeve Optimized Chewable    Bariatric Advantage Advanced Multi EA      3 Chewable Calcium (500 mg each, total 1200-1500 mg per day)  **Best Choice - Celebrate, Bariatric Advantage, or Wellesse  Other Options:  2 Celebrate MultiComplete with 18 mg Iron (this provides 6000 IU of  Vitamin D3)  4 Celebrate Essential Multi 2 in 1 (has calcium) + 18-60 mg separate  iron  Vitamins and Calcium are available at:   [facility]   [address], [place] [employer], [place] [address]   [email or web address]  [email or web address]  www.[email or web address]

## 2016-07-13 ENCOUNTER — Ambulatory Visit (INDEPENDENT_AMBULATORY_CARE_PROVIDER_SITE_OTHER): Payer: 59 | Admitting: Psychology

## 2016-07-14 ENCOUNTER — Ambulatory Visit (HOSPITAL_COMMUNITY)
Admission: RE | Admit: 2016-07-14 | Discharge: 2016-07-14 | Disposition: A | Discharge status: Home / Self Care | Payer: 59 | Source: Ambulatory Visit | Attending: General Surgery | Admitting: General Surgery

## 2016-07-14 ENCOUNTER — Other Ambulatory Visit: Payer: Self-pay

## 2016-07-14 DIAGNOSIS — K219 Gastro-esophageal reflux disease without esophagitis: Secondary | ICD-10-CM | POA: Insufficient documentation

## 2016-07-14 DIAGNOSIS — Z01818 Encounter for other preprocedural examination: Secondary | ICD-10-CM | POA: Insufficient documentation

## 2016-07-14 DIAGNOSIS — R9431 Abnormal electrocardiogram [ECG] [EKG]: Secondary | ICD-10-CM | POA: Diagnosis not present

## 2016-07-22 ENCOUNTER — Ambulatory Visit: Payer: 59 | Admitting: Skilled Nursing Facility1

## 2016-07-27 ENCOUNTER — Telehealth: Payer: Self-pay | Admitting: Internal Medicine

## 2016-07-27 ENCOUNTER — Encounter: Payer: Self-pay | Admitting: Skilled Nursing Facility1

## 2016-07-27 ENCOUNTER — Encounter: Payer: 59 | Attending: Family Medicine | Admitting: Skilled Nursing Facility1

## 2016-07-27 DIAGNOSIS — Z713 Dietary counseling and surveillance: Secondary | ICD-10-CM | POA: Insufficient documentation

## 2016-07-27 DIAGNOSIS — Z79899 Other long term (current) drug therapy: Secondary | ICD-10-CM | POA: Insufficient documentation

## 2016-07-27 DIAGNOSIS — Z6841 Body Mass Index (BMI) 40.0 and over, adult: Secondary | ICD-10-CM | POA: Diagnosis not present

## 2016-07-27 DIAGNOSIS — Z7984 Long term (current) use of oral hypoglycemic drugs: Secondary | ICD-10-CM | POA: Diagnosis not present

## 2016-07-27 DIAGNOSIS — E119 Type 2 diabetes mellitus without complications: Secondary | ICD-10-CM | POA: Diagnosis not present

## 2016-07-27 DIAGNOSIS — I10 Essential (primary) hypertension: Secondary | ICD-10-CM | POA: Insufficient documentation

## 2016-07-27 NOTE — Telephone Encounter (Signed)
Pt came in from her Pre-Op Visit this morning, the surgeon is requesting her be seen either a week before her surgery or a week after her surgery.  She is scheduled to have procedure on 08/11/16.  There are no openings available at this time and I was wanting to know how Dr. Cruzita Lederer wanted to proceed with this.  Please advise. (Can leave VM on pt's phone)

## 2016-07-27 NOTE — Progress Notes (Signed)
  Pre-Operative Nutrition Class:  Appt start time: 5427   End time:  1830.  Patient was seen on 07/27/2016 for Pre-Operative Bariatric Surgery Education at [facility].   Surgery date: Aug 11, 2016 Surgery type: Sleeve Gastrectomy  Start weight at [facility]: 292.3lbs Weight today: 296 lbs  TANITA  BODY COMP RESULTS  07/27/2016   BMI (kg/m^2) 55.9   Fat Mass (lbs) 165.4   Fat Free Mass (lbs) 130.6   Total Body Water (lbs) 97.4   Samples given per MNT protocol. Patient educated on appropriate usage:  Bariatric Advantage Calcium Citrate Lot # [id number] Exp:10/30/2016  [place] Lot # [id number] Exp: 06/2017  The following the learning objectives were met by the patient during this course:  Identify Pre-Op Dietary Goals and will begin 2 weeks pre-operatively  Identify appropriate sources of fluids and proteins   State protein recommendations and appropriate sources pre and post-operatively  Identify Post-Operative Dietary Goals and will follow for 2 weeks post-operatively  Identify appropriate multivitamin and calcium sources  Describe the need for physical activity post-operatively and will follow MD recommendations  State when to call healthcare provider regarding medication questions or post-operative complications  Handouts given during class include:  Pre-Op Bariatric Surgery Diet Handout  Protein Shake Handout  Post-Op Bariatric Surgery Nutrition Handout  [facility] Program Information Flyer  Support Group Information Flyer  [facility] Bariatric Supplements Price List  Follow-Up Plan: Patient will follow-up at [facility] 2 weeks post operatively for diet advancement per MD.

## 2016-07-28 NOTE — Telephone Encounter (Signed)
[name], let's add her to the schedule first available, however, I would want her to come for an HbA1c check (this can be point-of-care) few days prior to her surgery.

## 2016-07-28 NOTE — Telephone Encounter (Signed)
See message and please advise, Thanks!  

## 2016-07-28 NOTE — Telephone Encounter (Signed)
I contacted the patient and advised of message via voicemail. Requested a call back from the patient to schedule her lab and appointment.

## 2016-08-05 NOTE — Progress Notes (Signed)
ECHO 04-26-15 epic EKG 07-14-16 epic CXR 07-14-16 epic

## 2016-08-05 NOTE — Progress Notes (Signed)
Please place orders in epic. Patient has pre-op appt on 07-28-16 for scheduled surgery on 08-11-16. thanks

## 2016-08-05 NOTE — Patient Instructions (Addendum)
DRAKE LANDING  08/05/2016   Your procedure is scheduled on: 08-11-16  Report to [facility] Main  Entrance Take [place]  elevators to 3rd floor to  [place] at 1130AM.  Call this number if you have problems the morning of surgery [phone]    Remember: ONLY 1 PERSON MAY GO WITH YOU TO SHORT STAY TO GET  READY MORNING OF YOUR SURGERY.  Do not eat food :After Midnight. You may have clear liquids from midnight until 730am day of surgery. Nothing by motuh after 730am!!     Take these medicines the morning of surgery with A SIP OF WATER: nebivolol(bystolic), cetirizine(zyrtec) as needed DO NOT TAKE ANY DIABETIC MEDICATIONS DAY OF YOUR SURGERY                                You may not have any metal on your body including hair pins and              piercings  Do not wear jewelry, make-up, lotions, powders or perfumes, deodorant             Do not wear nail polish.  Do not shave  48 hours prior to surgery.     Do not bring valuables to the hospital. [place].  Contacts, dentures or bridgework may not be worn into surgery.  Leave suitcase in the car. After surgery it may be brought to your room.              Please read over the following fact sheets you were given: _____________________________________________________________________             How to Manage Your Diabetes Before and After Surgery  Why is it important to control my blood sugar before and after surgery? . Improving blood sugar levels before and after surgery helps healing and can limit problems. . A way of improving blood sugar control is eating a healthy diet by: o  Eating less sugar and carbohydrates o  Increasing activity/exercise o  Talking with your doctor about reaching your blood sugar goals . High blood sugars (greater than 180 mg/dL) can raise your risk of infections and slow your recovery, so you will need to focus on  controlling your diabetes during the weeks before surgery. . Make sure that the doctor who takes care of your diabetes knows about your planned surgery including the date and location.  How do I manage my blood sugar before surgery? . Check your blood sugar at least 4 times a day, starting 2 days before surgery, to make sure that the level is not too high or low. o Check your blood sugar the morning of your surgery when you wake up and every 2 hours until you get to the Short Stay unit. . If your blood sugar is less than 70 mg/dL, you will need to treat for low blood sugar: o Do not take insulin. o Treat a low blood sugar (less than 70 mg/dL) with  cup of clear juice (cranberry or apple), 4 glucose tablets, OR glucose gel. o Recheck blood sugar in 15 minutes after treatment (to make sure it is greater than 70 mg/dL). If your blood sugar is not greater than 70 mg/dL on  recheck, call [phone] for further instructions. . Report your blood sugar to the short stay nurse when you get to Short Stay.  . If you are admitted to the hospital after surgery: o Your blood sugar will be checked by the staff and you will probably be given insulin after surgery (instead of oral diabetes medicines) to make sure you have good blood sugar levels. o The goal for blood sugar control after surgery is 80-180 mg/dL.   WHAT DO I DO ABOUT MY DIABETES MEDICATION?  Marland Kitchen Do not take oral diabetes medicines (pills) the morning of surgery.  . THE DAY BEFORE SURGERY, take   Metformin as usual       . THE MORNING OF SURGERY, do not take Metformin   Patient Signature:  Date:   Nurse Signature:  Date:   Reviewed and Endorsed by [place] Patient Education Committee, August 2015  [facility] - Preparing for Surgery Before surgery, you can play an important role.  Because skin is not sterile, your skin needs to be as free of germs as possible.  You can reduce the number of germs on your skin by washing with CHG  (chlorahexidine gluconate) soap before surgery.  CHG is an antiseptic cleaner which kills germs and bonds with the skin to continue killing germs even after washing. Please DO NOT use if you have an allergy to CHG or antibacterial soaps.  If your skin becomes reddened/irritated stop using the CHG and inform your nurse when you arrive at Short Stay. Do not shave (including legs and underarms) for at least 48 hours prior to the first CHG shower.  You may shave your face/neck. Please follow these instructions carefully:  1.  Shower with CHG Soap the night before surgery and the  morning of Surgery.  2.  If you choose to wash your hair, wash your hair first as usual with your  normal  shampoo.  3.  After you shampoo, rinse your hair and body thoroughly to remove the  shampoo.                           4.  Use CHG as you would any other liquid soap.  You can apply chg directly  to the skin and wash                       Gently with a scrungie or clean washcloth.  5.  Apply the CHG Soap to your body ONLY FROM THE NECK DOWN.   Do not use on face/ open                           Wound or open sores. Avoid contact with eyes, ears mouth and genitals (private parts).                       Wash face,  Genitals (private parts) with your normal soap.             6.  Wash thoroughly, paying special attention to the area where your surgery  will be performed.  7.  Thoroughly rinse your body with warm water from the neck down.  8.  DO NOT shower/wash with your normal soap after using and rinsing off  the CHG Soap.                9.  Pat yourself  dry with a clean towel.            10.  Wear clean pajamas.            11.  Place clean sheets on your bed the night of your first shower and do not  sleep with pets. Day of Surgery : Do not apply any lotions/deodorants the morning of surgery.  Please wear clean clothes to the hospital/surgery center.  FAILURE TO FOLLOW THESE INSTRUCTIONS MAY RESULT IN THE CANCELLATION OF  YOUR SURGERY   ________________________________________________________________________       CLEAR LIQUID DIET   Foods Allowed                                                                     Foods Excluded  Coffee and tea, regular and decaf                             liquids that you cannot  Plain Jell-O in any flavor                                             see through such as: Fruit ices (not with fruit pulp)                                     milk, soups, orange juice  Iced Popsicles                                    All solid food Carbonated beverages, regular and diet                                    Cranberry, grape and apple juices Sports drinks like Gatorade Lightly seasoned clear broth or consume(fat free) Sugar, honey syrup  Sample Menu Breakfast                                Lunch                                     Supper Cranberry juice                    Beef broth                            Chicken broth Jell-O                                     Grape juice  Apple juice Coffee or tea                        Jell-O                                      Popsicle                                                Coffee or tea                        Coffee or tea  _____________________________________________________________________

## 2016-08-06 ENCOUNTER — Ambulatory Visit: Payer: Self-pay | Admitting: General Surgery

## 2016-08-07 ENCOUNTER — Encounter (HOSPITAL_COMMUNITY): Payer: Self-pay

## 2016-08-07 ENCOUNTER — Encounter (HOSPITAL_COMMUNITY)
Admission: RE | Admit: 2016-08-07 | Discharge: 2016-08-07 | Disposition: A | Discharge status: Home / Self Care | Payer: 59 | Source: Ambulatory Visit | Attending: General Surgery | Admitting: General Surgery

## 2016-08-07 ENCOUNTER — Ambulatory Visit: Payer: Self-pay | Admitting: General Surgery

## 2016-08-07 DIAGNOSIS — Z01812 Encounter for preprocedural laboratory examination: Secondary | ICD-10-CM | POA: Diagnosis not present

## 2016-08-07 DIAGNOSIS — Z6841 Body Mass Index (BMI) 40.0 and over, adult: Secondary | ICD-10-CM | POA: Insufficient documentation

## 2016-08-07 DIAGNOSIS — E1169 Type 2 diabetes mellitus with other specified complication: Secondary | ICD-10-CM | POA: Diagnosis not present

## 2016-08-07 DIAGNOSIS — E669 Obesity, unspecified: Secondary | ICD-10-CM | POA: Diagnosis not present

## 2016-08-07 DIAGNOSIS — I10 Essential (primary) hypertension: Secondary | ICD-10-CM | POA: Diagnosis not present

## 2016-08-07 LAB — CBC WITH DIFFERENTIAL/PLATELET
BASOS ABS: 0 10*3/uL (ref 0.0–0.1)
BASOS PCT: 0 %
EOS ABS: 0.3 10*3/uL (ref 0.0–0.7)
EOS PCT: 3 %
HCT: 39 % (ref 36.0–46.0)
HEMOGLOBIN: 13.2 g/dL (ref 12.0–15.0)
Lymphocytes Relative: 34 %
Lymphs Abs: 3 10*3/uL (ref 0.7–4.0)
MCH: 27.3 pg (ref 26.0–34.0)
MCHC: 33.8 g/dL (ref 30.0–36.0)
MCV: 80.6 fL (ref 78.0–100.0)
Monocytes Absolute: 0.6 10*3/uL (ref 0.1–1.0)
Monocytes Relative: 6 %
NEUTROS PCT: 57 %
Neutro Abs: 4.9 10*3/uL (ref 1.7–7.7)
PLATELETS: 297 10*3/uL (ref 150–400)
RBC: 4.84 MIL/uL (ref 3.87–5.11)
RDW: 15.7 % — ABNORMAL HIGH (ref 11.5–15.5)
WBC: 8.9 10*3/uL (ref 4.0–10.5)

## 2016-08-07 LAB — COMPREHENSIVE METABOLIC PANEL
ALBUMIN: 4.2 g/dL (ref 3.5–5.0)
ALK PHOS: 191 U/L — AB (ref 38–126)
ALT: 26 U/L (ref 14–54)
AST: 26 U/L (ref 15–41)
Anion gap: 11 (ref 5–15)
BUN: 19 mg/dL (ref 6–20)
CO2: 29 mmol/L (ref 22–32)
CREATININE: 1.05 mg/dL — AB (ref 0.44–1.00)
Calcium: 9.9 mg/dL (ref 8.9–10.3)
Chloride: 97 mmol/L — ABNORMAL LOW (ref 101–111)
GFR calc non Af Amer: >60 mL/min (ref >60)
GLUCOSE: 121 mg/dL — AB (ref 65–99)
Potassium: 3.4 mmol/L — ABNORMAL LOW (ref 3.5–5.1)
SODIUM: 137 mmol/L (ref 135–145)
Total Bilirubin: 0.4 mg/dL (ref 0.3–1.2)
Total Protein: 8.3 g/dL — ABNORMAL HIGH (ref 6.5–8.1)

## 2016-08-07 LAB — GLUCOSE, CAPILLARY: Glucose-Capillary: 117 mg/dL — ABNORMAL HIGH (ref 65–99)

## 2016-08-07 NOTE — H&P (Signed)
History of Present Illness Geoffery Spruce MD; 08/07/2016 11:08 AM) Patient words: preop bari.  The patient is a 51 year old female who presents for a bariatric surgery evaluation. Patient has completed all necessary workup in the preoperative phase for bariatric surgery. The patient is now ready to proceed with surgery. The patient has made moderate improvements in her diet and is exercising well and has no new medical issues or new medications.    Allergies Mammie Lorenzo, LPN; 2/83/1517 61:60 AM) No Known Allergies 06/10/2016  Medication History Mammie Lorenzo, LPN; 7/37/1062 69:48 AM) Bystolic (5MG  Tablet, Oral) Active. Indapamide (2.5MG  Tablet, Oral) Active. Januvia (100MG  Tablet, Oral) Active. MetFORMIN HCl ER (500MG  Tablet ER 24HR, Oral) Active. OneTouch Delica Lancets 54O Active. OneTouch Verio (In Vitro) Active. Medications Reconciled    Review of Systems Geoffery Spruce MD; 08/07/2016 11:08 AM) General Not Present- Appetite Loss, Chills, Fatigue, Fever, Night Sweats, Weight Gain and Weight Loss. Skin Present- Dryness. Not Present- Change in Wart/Mole, Hives, Jaundice, New Lesions, Non-Healing Wounds, Rash and Ulcer. HEENT Present- Wears glasses/contact lenses. Not Present- Earache, Hearing Loss, Hoarseness, Nose Bleed, Oral Ulcers, Ringing in the Ears, Seasonal Allergies, Sinus Pain, Sore Throat, Visual Disturbances and Yellow Eyes. Respiratory Not Present- Bloody sputum, Chronic Cough, Difficulty Breathing, Snoring and Wheezing. Breast Not Present- Breast Mass, Breast Pain, Nipple Discharge and Skin Changes. Cardiovascular Not Present- Chest Pain, Difficulty Breathing Lying Down, Leg Cramps, Palpitations, Rapid Heart Rate, Shortness of Breath and Swelling of Extremities. Gastrointestinal Not Present- Abdominal Pain, Bloating, Bloody Stool, Change in Bowel Habits, Chronic diarrhea, Constipation, Difficulty Swallowing, Excessive gas, Gets full quickly at meals,  Hemorrhoids, Indigestion, Nausea, Rectal Pain and Vomiting. Female Genitourinary Not Present- Frequency, Nocturia, Painful Urination, Pelvic Pain and Urgency. Musculoskeletal Present- Swelling of Extremities. Not Present- Back Pain, Joint Pain, Joint Stiffness, Muscle Pain and Muscle Weakness. Neurological Not Present- Decreased Memory, Fainting, Headaches, Numbness, Seizures, Tingling, Tremor, Trouble walking and Weakness. Psychiatric Not Present- Anxiety, Bipolar, Change in Sleep Pattern, Depression, Fearful and Frequent crying. Endocrine Present- New Diabetes. Not Present- Cold Intolerance, Excessive Hunger, Hair Changes, Heat Intolerance and Hot flashes. Hematology Not Present- Blood Thinners, Easy Bruising, Excessive bleeding, Gland problems, HIV and Persistent Infections.  Vitals Claiborne Billings Dockery LPN; 2/70/3500 93:81 AM) 08/07/2016 10:47 AM Weight: 285.2 lb Height: 61in Body Surface Area: 2.2 m Body Mass Index: 53.89 kg/m  Temp.: 97.108F(Oral)  Pulse: 79 (Regular)  BP: 122/84 (Sitting, Left Arm, Standard)       Physical Exam Geoffery Spruce MD; 08/07/2016 11:08 AM) General Mental Status-Alert. General Appearance-Cooperative. Orientation-Oriented X4. Build & Nutrition-Obese. Posture-Normal posture.  Integumentary Global Assessment Upon inspection and palpation of skin surfaces of the - Head/Face: no rashes, ulcers, lesions or evidence of photo damage. No palpable nodules or masses and Neck: no visible lesions or palpable masses.  Head and Neck Head-normocephalic, atraumatic with no lesions or palpable masses. Face Global Assessment - atraumatic. Thyroid Gland Characteristics - normal size and consistency.  Eye Eyeball - Bilateral-Extraocular movements intact. Sclera/Conjunctiva - Bilateral-No scleral icterus, No Discharge.  ENMT Nose and Sinuses External Inspection of the Nose - no deformities observed, no swelling present.  Chest and  Lung Exam Palpation Palpation of the chest reveals - Non-tender. Auscultation Breath sounds - Normal.  Cardiovascular Auscultation Rhythm - Regular. Heart Sounds - S1 WNL and S2 WNL. Carotid arteries - No Carotid bruit.  Abdomen Inspection Inspection of the abdomen reveals - No Visible peristalsis, No Abnormal pulsations and No Paradoxical movements. Palpation/Percussion Palpation and Percussion  of the abdomen reveal - Soft, Non Tender, No Rebound tenderness, No Rigidity (guarding), No hepatosplenomegaly and No Palpable abdominal masses.  Peripheral Vascular Upper Extremity Palpation - Pulses bilaterally normal. Lower Extremity Palpation - Edema - Bilateral - No edema.  Neurologic Neurologic evaluation reveals -normal sensation and normal coordination.  Neuropsychiatric Mental status exam performed with findings of-able to articulate well with normal speech/language, rate, volume and coherence and thought content normal with ability to perform basic computations and apply abstract reasoning.  Musculoskeletal Normal Exam - Bilateral-Upper Extremity Strength Normal and Lower Extremity Strength Normal.    Assessment & Plan Geoffery Spruce MD; 08/07/2016 11:09 AM) MORBID OBESITY (E66.01) Story: 51 yo female with morbid obesity. She was recently diagnosed with diabetes. She has made several improvements in diet over the last 2 years and her a1c has shown improvement. She is most interested in a sleeve and is an ideal candidate for that procedure. He has not completed all the necessary requirements. She has made moderate amount of improvement to her diet and lost an additional 6 pounds. She is now ready to proceed with surgery. Impression: We again discussed the details of the operation: Will be done under general anesthesia with an endotracheal tube, that it will be a laparoscopic procedure with 6 small incisions large one being 1.5-2in, that we will remove remove the short  gastrics and other vessels to the greater curve of the stomach, place a large Bougie down the stomach and then remove a percent of the stomach using a linear stapler. We discussed the procedure will take approximately 1.5-2 hours. We discussed risks of VTE, staple line leak, skin infection, sleeve stenosis, incisional hernia, need for open procedure, and postoperative nausea and vomiting. HYPERTENSION, BENIGN (I10) Impression: on 2 medications DIABETES MELLITUS TYPE 2 IN OBESE (E11.69) Impression: on januvia and metformin. Diagnosed in 11/2015

## 2016-08-08 LAB — HEMOGLOBIN A1C
HEMOGLOBIN A1C: 7.2 % — AB (ref 4.8–5.6)
MEAN PLASMA GLUCOSE: 160 mg/dL

## 2016-08-11 ENCOUNTER — Inpatient Hospital Stay (HOSPITAL_COMMUNITY): Payer: 59 | Admitting: Certified Registered Nurse Anesthetist

## 2016-08-11 ENCOUNTER — Encounter (HOSPITAL_COMMUNITY): Payer: Self-pay | Admitting: *Deleted

## 2016-08-11 ENCOUNTER — Inpatient Hospital Stay (HOSPITAL_COMMUNITY)
Admission: RE | Admit: 2016-08-11 | Discharge: 2016-08-12 | DRG: 621 | Disposition: A | Discharge status: Home / Self Care | Payer: 59 | Source: Ambulatory Visit | Attending: General Surgery | Admitting: General Surgery

## 2016-08-11 ENCOUNTER — Encounter (HOSPITAL_COMMUNITY)
Admission: RE | Disposition: A | Discharge status: Home / Self Care | Payer: Self-pay | Source: Ambulatory Visit | Attending: General Surgery

## 2016-08-11 DIAGNOSIS — I10 Essential (primary) hypertension: Secondary | ICD-10-CM | POA: Diagnosis present

## 2016-08-11 DIAGNOSIS — Z6841 Body Mass Index (BMI) 40.0 and over, adult: Secondary | ICD-10-CM | POA: Diagnosis not present

## 2016-08-11 DIAGNOSIS — E119 Type 2 diabetes mellitus without complications: Secondary | ICD-10-CM | POA: Diagnosis present

## 2016-08-11 DIAGNOSIS — Z7984 Long term (current) use of oral hypoglycemic drugs: Secondary | ICD-10-CM

## 2016-08-11 DIAGNOSIS — E785 Hyperlipidemia, unspecified: Secondary | ICD-10-CM | POA: Diagnosis not present

## 2016-08-11 HISTORY — PX: LAPAROSCOPIC GASTRIC SLEEVE RESECTION: SHX5895

## 2016-08-11 LAB — CBC
HEMATOCRIT: 36.8 % (ref 36.0–46.0)
HEMOGLOBIN: 11.9 g/dL — AB (ref 12.0–15.0)
MCH: 26.3 pg (ref 26.0–34.0)
MCHC: 32.3 g/dL (ref 30.0–36.0)
MCV: 81.4 fL (ref 78.0–100.0)
Platelets: 290 10*3/uL (ref 150–400)
RBC: 4.52 MIL/uL (ref 3.87–5.11)
RDW: 15.5 % (ref 11.5–15.5)
WBC: 10.7 10*3/uL — ABNORMAL HIGH (ref 4.0–10.5)

## 2016-08-11 LAB — HEMOGLOBIN AND HEMATOCRIT, BLOOD
HCT: 35.9 % — ABNORMAL LOW (ref 36.0–46.0)
Hemoglobin: 12.1 g/dL (ref 12.0–15.0)

## 2016-08-11 LAB — GLUCOSE, CAPILLARY
GLUCOSE-CAPILLARY: 164 mg/dL — AB (ref 65–99)
Glucose-Capillary: 127 mg/dL — ABNORMAL HIGH (ref 65–99)
Glucose-Capillary: 143 mg/dL — ABNORMAL HIGH (ref 65–99)

## 2016-08-11 LAB — CREATININE, SERUM
Creatinine, Ser: 1.25 mg/dL — ABNORMAL HIGH (ref 0.44–1.00)
GFR calc Af Amer: 58 mL/min — ABNORMAL LOW (ref >60)
GFR calc non Af Amer: 50 mL/min — ABNORMAL LOW (ref >60)

## 2016-08-11 SURGERY — GASTRECTOMY, SLEEVE, LAPAROSCOPIC
Anesthesia: General | Site: Abdomen

## 2016-08-11 MED ORDER — LIDOCAINE 2% (20 MG/ML) 5 ML SYRINGE
INTRAMUSCULAR | Status: AC
  Filled 2016-08-11: qty 5

## 2016-08-11 MED ORDER — ROCURONIUM BROMIDE 50 MG/5ML IV SOSY
PREFILLED_SYRINGE | INTRAVENOUS | Status: DC | PRN
  Administered 2016-08-11: 50 mg via INTRAVENOUS

## 2016-08-11 MED ORDER — PREMIER PROTEIN SHAKE
2.0000 [oz_av] | ORAL | Status: DC
  Administered 2016-08-12 (×6): 2 [oz_av] via ORAL

## 2016-08-11 MED ORDER — PROPOFOL 10 MG/ML IV BOLUS
INTRAVENOUS | Status: AC
  Filled 2016-08-11: qty 20

## 2016-08-11 MED ORDER — DEXAMETHASONE SODIUM PHOSPHATE 10 MG/ML IJ SOLN
4.0000 mg | INTRAMUSCULAR | Status: AC
  Administered 2016-08-11: 4 mg via INTRAVENOUS

## 2016-08-11 MED ORDER — MIDAZOLAM HCL 5 MG/5ML IJ SOLN
INTRAMUSCULAR | Status: DC | PRN
  Administered 2016-08-11: 2 mg via INTRAVENOUS

## 2016-08-11 MED ORDER — EPHEDRINE 5 MG/ML INJ
INTRAVENOUS | Status: AC
  Filled 2016-08-11: qty 10

## 2016-08-11 MED ORDER — PANTOPRAZOLE SODIUM 40 MG IV SOLR
40.0000 mg | Freq: Every day | INTRAVENOUS | Status: DC
  Administered 2016-08-11: 40 mg via INTRAVENOUS
  Filled 2016-08-11: qty 40

## 2016-08-11 MED ORDER — ACETAMINOPHEN 500 MG PO TABS
1000.0000 mg | ORAL_TABLET | ORAL | Status: AC
  Administered 2016-08-11: 1000 mg via ORAL
  Filled 2016-08-11: qty 2

## 2016-08-11 MED ORDER — 0.9 % SODIUM CHLORIDE (POUR BTL) OPTIME
TOPICAL | Status: DC | PRN
  Administered 2016-08-11: 1000 mL

## 2016-08-11 MED ORDER — ACETAMINOPHEN 325 MG PO TABS
650.0000 mg | ORAL_TABLET | ORAL | Status: DC | PRN
Start: 2016-08-11 — End: 2016-08-12

## 2016-08-11 MED ORDER — SODIUM CHLORIDE 0.9 % IV SOLN
INTRAVENOUS | Status: DC
  Administered 2016-08-11: 19:00:00 via INTRAVENOUS
  Administered 2016-08-12: 1000 mL via INTRAVENOUS

## 2016-08-11 MED ORDER — PROPOFOL 10 MG/ML IV BOLUS
INTRAVENOUS | Status: DC | PRN
  Administered 2016-08-11: 200 mg via INTRAVENOUS

## 2016-08-11 MED ORDER — ONDANSETRON HCL 4 MG/2ML IJ SOLN: 4.0000 mg | INTRAMUSCULAR | Status: DC | PRN

## 2016-08-11 MED ORDER — NEBIVOLOL HCL 5 MG PO TABS
5.0000 mg | ORAL_TABLET | Freq: Every day | ORAL | Status: DC
  Administered 2016-08-12: 5 mg via ORAL
  Filled 2016-08-11: qty 1

## 2016-08-11 MED ORDER — HYDROMORPHONE HCL 1 MG/ML IJ SOLN
INTRAMUSCULAR | Status: AC
  Filled 2016-08-11: qty 1

## 2016-08-11 MED ORDER — ONDANSETRON HCL 4 MG/2ML IJ SOLN
INTRAMUSCULAR | Status: AC
  Filled 2016-08-11: qty 2

## 2016-08-11 MED ORDER — HYDROMORPHONE HCL 1 MG/ML IJ SOLN
0.2500 mg | INTRAMUSCULAR | Status: DC | PRN
  Administered 2016-08-11 (×3): 0.5 mg via INTRAVENOUS

## 2016-08-11 MED ORDER — SUCCINYLCHOLINE CHLORIDE 200 MG/10ML IV SOSY
PREFILLED_SYRINGE | INTRAVENOUS | Status: AC
  Filled 2016-08-11: qty 10

## 2016-08-11 MED ORDER — SUGAMMADEX SODIUM 200 MG/2ML IV SOLN
INTRAVENOUS | Status: DC | PRN
  Administered 2016-08-11: 400 mg via INTRAVENOUS

## 2016-08-11 MED ORDER — EPHEDRINE SULFATE 50 MG/ML IJ SOLN
INTRAMUSCULAR | Status: DC | PRN
  Administered 2016-08-11: 10 mg via INTRAVENOUS
  Administered 2016-08-11 (×2): 5 mg via INTRAVENOUS

## 2016-08-11 MED ORDER — LIDOCAINE 2% (20 MG/ML) 5 ML SYRINGE
INTRAMUSCULAR | Status: DC | PRN
  Administered 2016-08-11: 100 mg via INTRAVENOUS

## 2016-08-11 MED ORDER — BUPIVACAINE HCL (PF) 0.25 % IJ SOLN
INTRAMUSCULAR | Status: DC | PRN
  Administered 2016-08-11: 30 mL

## 2016-08-11 MED ORDER — CHLORHEXIDINE GLUCONATE 4 % EX LIQD: 60.0000 mL | Freq: Once | CUTANEOUS | Status: DC

## 2016-08-11 MED ORDER — MIDAZOLAM HCL 2 MG/2ML IJ SOLN
INTRAMUSCULAR | Status: AC
  Filled 2016-08-11: qty 2

## 2016-08-11 MED ORDER — SCOPOLAMINE 1 MG/3DAYS TD PT72
1.0000 | MEDICATED_PATCH | TRANSDERMAL | Status: DC
  Administered 2016-08-11: 1.5 mg via TRANSDERMAL
  Filled 2016-08-11: qty 1

## 2016-08-11 MED ORDER — DEXAMETHASONE SODIUM PHOSPHATE 10 MG/ML IJ SOLN
INTRAMUSCULAR | Status: AC
  Filled 2016-08-11: qty 1

## 2016-08-11 MED ORDER — BUPIVACAINE LIPOSOME 1.3 % IJ SUSP
20.0000 mL | Freq: Once | INTRAMUSCULAR | Status: AC
  Administered 2016-08-11: 20 mL
  Filled 2016-08-11: qty 20

## 2016-08-11 MED ORDER — ROCURONIUM BROMIDE 50 MG/5ML IV SOSY
PREFILLED_SYRINGE | INTRAVENOUS | Status: AC
  Filled 2016-08-11: qty 5

## 2016-08-11 MED ORDER — SUCCINYLCHOLINE CHLORIDE 200 MG/10ML IV SOSY
PREFILLED_SYRINGE | INTRAVENOUS | Status: DC | PRN
  Administered 2016-08-11: 140 mg via INTRAVENOUS

## 2016-08-11 MED ORDER — ONDANSETRON HCL 4 MG/2ML IJ SOLN
INTRAMUSCULAR | Status: DC | PRN
  Administered 2016-08-11: 4 mg via INTRAVENOUS

## 2016-08-11 MED ORDER — BUPIVACAINE HCL (PF) 0.25 % IJ SOLN
INTRAMUSCULAR | Status: AC
  Filled 2016-08-11: qty 30

## 2016-08-11 MED ORDER — LACTATED RINGERS IR SOLN
Status: DC | PRN
  Administered 2016-08-11: 1000 mL

## 2016-08-11 MED ORDER — LACTATED RINGERS IV SOLN
INTRAVENOUS | Status: DC
  Administered 2016-08-11 (×2): via INTRAVENOUS

## 2016-08-11 MED ORDER — MORPHINE SULFATE (PF) 4 MG/ML IV SOLN
1.0000 mg | INTRAVENOUS | Status: DC | PRN
  Administered 2016-08-11: 2 mg via INTRAVENOUS
  Filled 2016-08-11: qty 1

## 2016-08-11 MED ORDER — APREPITANT 40 MG PO CAPS
40.0000 mg | ORAL_CAPSULE | ORAL | Status: AC
  Administered 2016-08-11: 40 mg via ORAL
  Filled 2016-08-11: qty 1

## 2016-08-11 MED ORDER — ACETAMINOPHEN 160 MG/5ML PO SOLN
325.0000 mg | ORAL | Status: DC | PRN
  Administered 2016-08-12: 650 mg via ORAL
  Filled 2016-08-11: qty 20.3

## 2016-08-11 MED ORDER — HEPARIN SODIUM (PORCINE) 5000 UNIT/ML IJ SOLN
5000.0000 [IU] | INTRAMUSCULAR | Status: AC
  Administered 2016-08-11: 5000 [IU] via SUBCUTANEOUS
  Filled 2016-08-11: qty 1

## 2016-08-11 MED ORDER — OXYCODONE HCL 5 MG/5ML PO SOLN
5.0000 mg | Freq: Once | ORAL | Status: DC | PRN
  Filled 2016-08-11: qty 5

## 2016-08-11 MED ORDER — LORATADINE 10 MG PO TABS
10.0000 mg | ORAL_TABLET | Freq: Every day | ORAL | Status: DC
  Filled 2016-08-11: qty 1

## 2016-08-11 MED ORDER — FENTANYL CITRATE (PF) 100 MCG/2ML IJ SOLN
INTRAMUSCULAR | Status: DC | PRN
  Administered 2016-08-11 (×5): 50 ug via INTRAVENOUS

## 2016-08-11 MED ORDER — CEFOTETAN DISODIUM-DEXTROSE 2-2.08 GM-% IV SOLR
2.0000 g | INTRAVENOUS | Status: AC
  Administered 2016-08-11: 2 g via INTRAVENOUS
  Filled 2016-08-11: qty 50

## 2016-08-11 MED ORDER — PHENYLEPHRINE HCL 10 MG/ML IJ SOLN
INTRAMUSCULAR | Status: DC | PRN
  Administered 2016-08-11: 80 ug via INTRAVENOUS

## 2016-08-11 MED ORDER — OXYCODONE HCL 5 MG/5ML PO SOLN
5.0000 mg | ORAL | Status: DC | PRN
  Administered 2016-08-12 (×3): 5 mg via ORAL
  Filled 2016-08-11 (×3): qty 5

## 2016-08-11 MED ORDER — LIDOCAINE 2% (20 MG/ML) 5 ML SYRINGE
INTRAMUSCULAR | Status: DC | PRN
  Administered 2016-08-11: 1.5 mg/kg/h via INTRAVENOUS

## 2016-08-11 MED ORDER — ONDANSETRON HCL 4 MG/2ML IJ SOLN: 4.0000 mg | Freq: Four times a day (QID) | INTRAMUSCULAR | Status: DC | PRN

## 2016-08-11 MED ORDER — GABAPENTIN 300 MG PO CAPS
300.0000 mg | ORAL_CAPSULE | ORAL | Status: AC
  Administered 2016-08-11: 300 mg via ORAL
  Filled 2016-08-11: qty 1

## 2016-08-11 MED ORDER — FENTANYL CITRATE (PF) 250 MCG/5ML IJ SOLN
INTRAMUSCULAR | Status: AC
  Filled 2016-08-11: qty 5

## 2016-08-11 MED ORDER — ENOXAPARIN SODIUM 30 MG/0.3ML ~~LOC~~ SOLN
30.0000 mg | Freq: Two times a day (BID) | SUBCUTANEOUS | Status: DC
  Administered 2016-08-12: 30 mg via SUBCUTANEOUS
  Filled 2016-08-11: qty 0.3

## 2016-08-11 MED ORDER — OXYCODONE HCL 5 MG PO TABS: 5.0000 mg | ORAL_TABLET | Freq: Once | ORAL | Status: DC | PRN

## 2016-08-11 SURGICAL SUPPLY — 60 items
APPLIER CLIP 5 13 M/L LIGAMAX5 (MISCELLANEOUS)
APPLIER CLIP ROT 10 11.4 M/L (STAPLE)
APPLIER CLIP ROT 13.4 12 LRG (CLIP)
BAG LAPAROSCOPIC 12 15 PORT 16 (BASKET) ×1 IMPLANT
BAG RETRIEVAL 12/15 (BASKET) ×2
BAG RETRIEVAL 12/15MM (BASKET) ×1
BANDAGE ADH SHEER 1  50/CT (GAUZE/BANDAGES/DRESSINGS) ×18 IMPLANT
BENZOIN TINCTURE PRP APPL 2/3 (GAUZE/BANDAGES/DRESSINGS) ×3 IMPLANT
BLADE SURG SZ11 CARB STEEL (BLADE) ×3 IMPLANT
CABLE HIGH FREQUENCY MONO STRZ (ELECTRODE) ×3 IMPLANT
CHLORAPREP W/TINT 26ML (MISCELLANEOUS) ×3 IMPLANT
CLIP APPLIE 5 13 M/L LIGAMAX5 (MISCELLANEOUS) IMPLANT
CLIP APPLIE ROT 10 11.4 M/L (STAPLE) IMPLANT
CLIP APPLIE ROT 13.4 12 LRG (CLIP) IMPLANT
CLOSURE WOUND 1/2 X4 (GAUZE/BANDAGES/DRESSINGS) ×1
COVER SURGICAL LIGHT HANDLE (MISCELLANEOUS) ×3 IMPLANT
DRAIN CHANNEL 19F RND (DRAIN) IMPLANT
ELECT REM PT RETURN 15FT ADLT (MISCELLANEOUS) ×3 IMPLANT
EVACUATOR SILICONE 100CC (DRAIN) IMPLANT
GAUZE SPONGE 4X4 12PLY STRL (GAUZE/BANDAGES/DRESSINGS) IMPLANT
GLOVE BIOGEL PI IND STRL 7.0 (GLOVE) ×1 IMPLANT
GLOVE BIOGEL PI INDICATOR 7.0 (GLOVE) ×2
GLOVE SURG SS PI 7.0 STRL IVOR (GLOVE) ×3 IMPLANT
GOWN STRL REUS W/TWL LRG LVL3 (GOWN DISPOSABLE) ×3 IMPLANT
GOWN STRL REUS W/TWL XL LVL3 (GOWN DISPOSABLE) ×9 IMPLANT
GRASPER SUT TROCAR 14GX15 (MISCELLANEOUS) ×3 IMPLANT
HANDLE STAPLE EGIA 4 XL (STAPLE) ×3 IMPLANT
HOVERMATT SINGLE USE (MISCELLANEOUS) ×3 IMPLANT
KIT BASIN OR (CUSTOM PROCEDURE TRAY) ×3 IMPLANT
MARKER SKIN DUAL TIP RULER LAB (MISCELLANEOUS) ×3 IMPLANT
NEEDLE SPNL 22GX3.5 QUINCKE BK (NEEDLE) ×3 IMPLANT
PACK CARDIOVASCULAR III (CUSTOM PROCEDURE TRAY) ×3 IMPLANT
RELOAD EGIA 45 MED/THCK PURPLE (STAPLE) IMPLANT
RELOAD TRI 45 ART MED THCK BLK (STAPLE) IMPLANT
RELOAD TRI 45 ART MED THCK PUR (STAPLE) ×3 IMPLANT
RELOAD TRI 60 ART MED THCK BLK (STAPLE) ×6 IMPLANT
RELOAD TRI 60 ART MED THCK PUR (STAPLE) ×6 IMPLANT
SCISSORS LAP 5X45 EPIX DISP (ENDOMECHANICALS) ×3 IMPLANT
SET IRRIG TUBING LAPAROSCOPIC (IRRIGATION / IRRIGATOR) ×3 IMPLANT
SHEARS HARMONIC ACE PLUS 45CM (MISCELLANEOUS) ×3 IMPLANT
SLEEVE GASTRECTOMY 40FR VISIGI (MISCELLANEOUS) ×3 IMPLANT
SLEEVE XCEL OPT CAN 5 100 (ENDOMECHANICALS) ×6 IMPLANT
SOLUTION ANTI FOG 6CC (MISCELLANEOUS) ×3 IMPLANT
SPONGE LAP 18X18 X RAY DECT (DISPOSABLE) ×3 IMPLANT
STRIP CLOSURE SKIN 1/2X4 (GAUZE/BANDAGES/DRESSINGS) ×2 IMPLANT
SUT ETHIBOND 0 36 GRN (SUTURE) IMPLANT
SUT ETHILON 2 0 PS N (SUTURE) IMPLANT
SUT MNCRL AB 4-0 PS2 18 (SUTURE) ×3 IMPLANT
SUT SILK 0 SH 30 (SUTURE) IMPLANT
SUT VICRYL 0 TIES 12 18 (SUTURE) ×3 IMPLANT
SYR 20CC LL (SYRINGE) ×3 IMPLANT
SYR 50ML LL SCALE MARK (SYRINGE) ×3 IMPLANT
TOWEL OR 17X26 10 PK STRL BLUE (TOWEL DISPOSABLE) ×3 IMPLANT
TOWEL OR NON WOVEN STRL DISP B (DISPOSABLE) ×3 IMPLANT
TROCAR BLADELESS 15MM (ENDOMECHANICALS) ×3 IMPLANT
TROCAR BLADELESS OPT 5 100 (ENDOMECHANICALS) ×3 IMPLANT
TUBING CONNECTING 10 (TUBING) ×2 IMPLANT
TUBING CONNECTING 10' (TUBING) ×1
TUBING ENDO SMARTCAP (MISCELLANEOUS) ×3 IMPLANT
TUBING INSUF HEATED (TUBING) ×3 IMPLANT

## 2016-08-11 NOTE — Anesthesia Preprocedure Evaluation (Signed)
Anesthesia Evaluation  Patient identified by MRN, date of birth, ID band Patient awake    Reviewed: Allergy & Precautions, H&P , NPO status , Patient's Chart, lab work & pertinent test results  Airway Mallampati: II   Neck ROM: full    Dental   Pulmonary shortness of breath,    breath sounds clear to auscultation       Cardiovascular hypertension,  Rhythm:regular Rate:Normal     Neuro/Psych  Headaches, TIA Neuromuscular disease    GI/Hepatic   Endo/Other  diabetes, Type 2Morbid obesity  Renal/GU      Musculoskeletal  (+) Arthritis ,   Abdominal   Peds  Hematology   Anesthesia Other Findings   Reproductive/Obstetrics                             Anesthesia Physical Anesthesia Plan  ASA: II  Anesthesia Plan: General   Post-op Pain Management:    Induction: Intravenous  Airway Management Planned: Oral ETT  Additional Equipment:   Intra-op Plan:   Post-operative Plan: Extubation in OR  Informed Consent: I have reviewed the patients History and Physical, chart, labs and discussed the procedure including the risks, benefits and alternatives for the proposed anesthesia with the patient or authorized representative who has indicated his/her understanding and acceptance.     Plan Discussed with: CRNA, Anesthesiologist and Surgeon  Anesthesia Plan Comments:         Anesthesia Quick Evaluation

## 2016-08-11 NOTE — Op Note (Signed)
Preop Diagnosis: Obesity Class III  Postop Diagnosis: same  Procedure performed: laparoscopic Sleeve Gastrectomy  Assitant: Greer Pickerel  Indications:  The patient is a 51 y.o. year-old morbidly obese female who has been followed in the Bariatric Clinic as an outpatient. This patient was diagnosed with morbid obesity with a BMI of Body mass index is 52.91 kg/m. and significant co-morbidities including hypertension and non-insulin dependent diabetes.  The patient was counseled extensively in the Bariatric Outpatient Clinic and after a thorough explanation of the risks and benefits of surgery (including death from complications, bowel leak, infection such as peritonitis and/or sepsis, internal hernia, bleeding, need for blood transfusion, bowel obstruction, organ failure, pulmonary embolus, deep venous thrombosis, wound infection, incisional hernia, skin breakdown, and others entailed on the consent form) and after a compliant diet and exercise program, the patient was scheduled for an elective laparoscopic sleeve gastrectomy.  Description of Operation:  Following informed consent, the patient was taken to the operating room and placed on the operating table in the supine position.  She had previously received prophylactic antibiotics and subcutaneous heparin for DVT prophylaxis in the pre-op holding area.  After induction of general endotracheal anesthesia by the anesthesiologist, the patient underwent placement of sequential compression devices, Foley catheter and an oro-gastric tube.  A timeout was confirmed by the surgery and anesthesia teams.  The patient was adequately padded at all pressure points and placed on a footboard to prevent slippage from the OR table during extremes of position during surgery.  She underwent a routine sterile prep and drape of her entire abdomen.    Next, A transverse incision was made under the left subcostal area and a 37mm optical viewing trocar was introduced into the  peritoneal cavity. Pneumoperitoneum was applied with a high flow and low pressure. A laparoscope was inserted to confirm placement. A extraperitoneal block was then placed at the lateral abdominal wall using exparel diluted with marcaine. 5 additional trocars were placed: 1 55mm trocar to the left of the midline. 1 additional 72mm trocar in the left lateral area, 1 40mm trocar in the right mid abdomen, and 1 62mm trocar in the right subcostal area.  The fat pad at the GE junction was incised and the gastrodiaphragmatic ligament was divided using the Harmonic scalpel. Next, a hole was created through the lesser omentum along the greater curve of the stomach to enter the lesser sac. The vessels along the greater omentum were  Then ligated and divided using the Harmonic scalpel moving towards the spleen and then short gastric vessels were ligated and divided in the same fashion to fully mobilize the fundus. The left crus was identified to ensure completion of the dissection. Next the antrum was measured and dissection continued inferiorly along the greater curve towards the pylorus and stopped 6cm from the pylorus.   A 40Fr ViSiGi dilator was placed into the esophgaus and along the lesser curve of the stomach and placed on suction. A 38mm 4-5mm tristapler was used to begin the resection along the antrum being sure to stay well away from the angularis by angling the jaws of the stapler towards the greater curve. An additional 39mm 4-45mm tristapler was used to continue the dissection. Then multiple 77mm 3-81mm tristapler loads and 1 49mm 3-4 mm tristapler were used to complete the resection staying along the [place] and ensuring the fundus was not retained by appropriately retracting it lateral. Air was inserted through the [place] to perform a leak test showing no bubbles and a  neutral lie of the stomach.  The assistant then went and performed an upper endoscopy and leak test. No bubbles were seen and the sleeve and  antrum distended appropriately. The specimen was then placed in an endocatch bag and removed by the 55mm port. The fascia of the 36mm port was closed with a 0 vicryl by suture passer. Hemostasis was ensured. Pneumoperitoneum was evacuated, all ports were removed and all incisions closed with 4-0 monocryl suture in subcuticular fashion. Glue was put in place for dressing. The patient awoke from anesthesia and was brought to pacu in stable condition. All counts were correct.  Estimated blood loss: <30ml  Specimens:  Sleeve gastrectomy  Local Anesthesia: 50 ml Exparel:0.5% Marcaine mix  Post-Op Plan:       Pain Management: PO, prn      Antibiotics: Prophylactic      Anticoagulation: Prophylactic, Starting now      Post Op Studies/Consults: Not applicable      Intended Discharge: within 48h      Intended Outpatient Follow-Up: Two Week      Intended Outpatient Studies: Not Applicable      Other: Not Applicable   [name] [name]

## 2016-08-11 NOTE — Discharge Instructions (Signed)

## 2016-08-11 NOTE — H&P (View-Only) (Signed)
History of Present Illness Geoffery Spruce MD; 08/07/2016 11:08 AM) Patient words: preop bari.  The patient is a 51 year old female who presents for a bariatric surgery evaluation. Patient has completed all necessary workup in the preoperative phase for bariatric surgery. The patient is now ready to proceed with surgery. The patient has made moderate improvements in her diet and is exercising well and has no new medical issues or new medications.    Allergies Mammie Lorenzo, LPN; 0/86/5784 69:62 AM) No Known Allergies 06/10/2016  Medication History Mammie Lorenzo, LPN; 9/52/8413 24:40 AM) Bystolic (5MG  Tablet, Oral) Active. Indapamide (2.5MG  Tablet, Oral) Active. Januvia (100MG  Tablet, Oral) Active. MetFORMIN HCl ER (500MG  Tablet ER 24HR, Oral) Active. OneTouch Delica Lancets 10U Active. OneTouch Verio (In Vitro) Active. Medications Reconciled    Review of Systems Geoffery Spruce MD; 08/07/2016 11:08 AM) General Not Present- Appetite Loss, Chills, Fatigue, Fever, Night Sweats, Weight Gain and Weight Loss. Skin Present- Dryness. Not Present- Change in Wart/Mole, Hives, Jaundice, New Lesions, Non-Healing Wounds, Rash and Ulcer. HEENT Present- Wears glasses/contact lenses. Not Present- Earache, Hearing Loss, Hoarseness, Nose Bleed, Oral Ulcers, Ringing in the Ears, Seasonal Allergies, Sinus Pain, Sore Throat, Visual Disturbances and Yellow Eyes. Respiratory Not Present- Bloody sputum, Chronic Cough, Difficulty Breathing, Snoring and Wheezing. Breast Not Present- Breast Mass, Breast Pain, Nipple Discharge and Skin Changes. Cardiovascular Not Present- Chest Pain, Difficulty Breathing Lying Down, Leg Cramps, Palpitations, Rapid Heart Rate, Shortness of Breath and Swelling of Extremities. Gastrointestinal Not Present- Abdominal Pain, Bloating, Bloody Stool, Change in Bowel Habits, Chronic diarrhea, Constipation, Difficulty Swallowing, Excessive gas, Gets full quickly at meals,  Hemorrhoids, Indigestion, Nausea, Rectal Pain and Vomiting. Female Genitourinary Not Present- Frequency, Nocturia, Painful Urination, Pelvic Pain and Urgency. Musculoskeletal Present- Swelling of Extremities. Not Present- Back Pain, Joint Pain, Joint Stiffness, Muscle Pain and Muscle Weakness. Neurological Not Present- Decreased Memory, Fainting, Headaches, Numbness, Seizures, Tingling, Tremor, Trouble walking and Weakness. Psychiatric Not Present- Anxiety, Bipolar, Change in Sleep Pattern, Depression, Fearful and Frequent crying. Endocrine Present- New Diabetes. Not Present- Cold Intolerance, Excessive Hunger, Hair Changes, Heat Intolerance and Hot flashes. Hematology Not Present- Blood Thinners, Easy Bruising, Excessive bleeding, Gland problems, HIV and Persistent Infections.  Vitals Claiborne Billings Dockery LPN; 11/05/3662 40:34 AM) 08/07/2016 10:47 AM Weight: 285.2 lb Height: 61in Body Surface Area: 2.2 m Body Mass Index: 53.89 kg/m  Temp.: 97.49F(Oral)  Pulse: 79 (Regular)  BP: 122/84 (Sitting, Left Arm, Standard)       Physical Exam Geoffery Spruce MD; 08/07/2016 11:08 AM) General Mental Status-Alert. General Appearance-Cooperative. Orientation-Oriented X4. Build & Nutrition-Obese. Posture-Normal posture.  Integumentary Global Assessment Upon inspection and palpation of skin surfaces of the - Head/Face: no rashes, ulcers, lesions or evidence of photo damage. No palpable nodules or masses and Neck: no visible lesions or palpable masses.  Head and Neck Head-normocephalic, atraumatic with no lesions or palpable masses. Face Global Assessment - atraumatic. Thyroid Gland Characteristics - normal size and consistency.  Eye Eyeball - Bilateral-Extraocular movements intact. Sclera/Conjunctiva - Bilateral-No scleral icterus, No Discharge.  ENMT Nose and Sinuses External Inspection of the Nose - no deformities observed, no swelling present.  Chest and  Lung Exam Palpation Palpation of the chest reveals - Non-tender. Auscultation Breath sounds - Normal.  Cardiovascular Auscultation Rhythm - Regular. Heart Sounds - S1 WNL and S2 WNL. Carotid arteries - No Carotid bruit.  Abdomen Inspection Inspection of the abdomen reveals - No Visible peristalsis, No Abnormal pulsations and No Paradoxical movements. Palpation/Percussion Palpation and Percussion  of the abdomen reveal - Soft, Non Tender, No Rebound tenderness, No Rigidity (guarding), No hepatosplenomegaly and No Palpable abdominal masses.  Peripheral Vascular Upper Extremity Palpation - Pulses bilaterally normal. Lower Extremity Palpation - Edema - Bilateral - No edema.  Neurologic Neurologic evaluation reveals -normal sensation and normal coordination.  Neuropsychiatric Mental status exam performed with findings of-able to articulate well with normal speech/language, rate, volume and coherence and thought content normal with ability to perform basic computations and apply abstract reasoning.  Musculoskeletal Normal Exam - Bilateral-Upper Extremity Strength Normal and Lower Extremity Strength Normal.    Assessment & Plan Geoffery Spruce MD; 08/07/2016 11:09 AM) MORBID OBESITY (E66.01) Story: 51 yo female with morbid obesity. She was recently diagnosed with diabetes. She has made several improvements in diet over the last 2 years and her a1c has shown improvement. She is most interested in a sleeve and is an ideal candidate for that procedure. He has not completed all the necessary requirements. She has made moderate amount of improvement to her diet and lost an additional 6 pounds. She is now ready to proceed with surgery. Impression: We again discussed the details of the operation: Will be done under general anesthesia with an endotracheal tube, that it will be a laparoscopic procedure with 6 small incisions large one being 1.5-2in, that we will remove remove the short  gastrics and other vessels to the greater curve of the stomach, place a large Bougie down the stomach and then remove a percent of the stomach using a linear stapler. We discussed the procedure will take approximately 1.5-2 hours. We discussed risks of VTE, staple line leak, skin infection, sleeve stenosis, incisional hernia, need for open procedure, and postoperative nausea and vomiting. HYPERTENSION, BENIGN (I10) Impression: on 2 medications DIABETES MELLITUS TYPE 2 IN OBESE (E11.69) Impression: on januvia and metformin. Diagnosed in 11/2015

## 2016-08-11 NOTE — Anesthesia Procedure Notes (Signed)
Procedure Name: Intubation Date/Time: 08/11/2016 1:28 PM Performed by: Maxwell Caul Pre-anesthesia Checklist: Patient identified, Emergency Drugs available, Suction available and Patient being monitored Patient Re-evaluated:Patient Re-evaluated prior to inductionOxygen Delivery Method: Circle system utilized Preoxygenation: Pre-oxygenation with 100% oxygen Intubation Type: IV induction Ventilation: Mask ventilation without difficulty Laryngoscope Size: Mac and 4 Grade View: Grade I Tube type: Oral Tube size: 7.5 mm Number of attempts: 1 Airway Equipment and Method: Stylet and Oral airway Placement Confirmation: ETT inserted through vocal cords under direct vision,  positive ETCO2 and breath sounds checked- equal and bilateral Secured at: 21 cm Tube secured with: Tape Dental Injury: Teeth and Oropharynx as per pre-operative assessment

## 2016-08-11 NOTE — Anesthesia Postprocedure Evaluation (Signed)
Anesthesia Post Note  Patient: DICIE EDELEN  Procedure(s) Performed: Procedure(s) (LRB): LAPAROSCOPIC GASTRIC SLEEVE RESECTION, UPPER ENDOSCOPY (N/A)  Patient location during evaluation: PACU Anesthesia Type: General Level of consciousness: awake and alert and patient cooperative Pain management: pain level controlled Vital Signs Assessment: post-procedure vital signs reviewed and stable Respiratory status: spontaneous breathing and respiratory function stable Cardiovascular status: stable Anesthetic complications: no       Last Vitals:  Vitals:   08/11/16 1600 08/11/16 1615  BP:  129/72  Pulse: 70 73  Resp: 16 15  Temp:  36.3 C    Last Pain:  Vitals:   08/11/16 1608  TempSrc:   PainSc: Asleep                 Retina Bernardy S

## 2016-08-11 NOTE — Op Note (Signed)
TRENTON VERNE [id number] 1965/08/09 08/11/2016  Preoperative diagnosis: morbid obesity  Postoperative diagnosis: Same   Procedure: upper endoscopy   Surgeon: Leighton Ruff. Madailein Londo M.D., FACS   Anesthesia: Gen.   Indications for procedure: 51 y.o. year old female undergoing Laparoscopic Gastric Sleeve Resection and an EGD was requested to evaluate the new gastric sleeve.   Description of procedure: After we have completed the sleeve resection, I scrubbed out and obtained the Olympus endoscope. I gently placed endoscope in the patient's oropharynx and gently glided it down the esophagus without any difficulty under direct visualization. Once I was in the gastric sleeve, I insufflated the stomach with air. I was able to cannulate and advanced the scope through the gastric sleeve. I was able to cannulate the duodenum with ease. Dr. Kieth Brightly had placed saline in the upper abdomen. Upon further insufflation of the gastric sleeve there was no evidence of bubbles. GE junction located at 36 cm.  Upon further inspection of the gastric sleeve, the mucosa appeared normal. There is no evidence of any mucosal abnormality. The sleeve was widely patent at the angularis. There was no evidence of bleeding. The gastric sleeve was decompressed. The scope was withdrawn. The patient tolerated this portion of the procedure well. Please see Dr Amie Portland operative note for details regarding the laparoscopic gastric sleeve resection.   Leighton Ruff. Redmond Pulling, MD, FACS  General, Bariatric, & Minimally Invasive Surgery  [facility], [place]

## 2016-08-11 NOTE — Transfer of Care (Signed)
Immediate Anesthesia Transfer of Care Note  Patient: [name]  Procedure(s) Performed: Procedure(s): LAPAROSCOPIC GASTRIC SLEEVE RESECTION, UPPER ENDOSCOPY (N/A)  Patient Location: PACU  Anesthesia Type:General  Level of Consciousness:  sedated, patient cooperative and responds to stimulation  Airway & Oxygen Therapy:Patient Spontanous Breathing and Patient connected to face mask oxgen  Post-op Assessment:  Report given to PACU RN and Post -op Vital signs reviewed and stable  Post vital signs:  Reviewed and stable  Last Vitals:  Vitals:   08/11/16 1120  BP: (!) 136/94  Pulse: 65  Resp: 18  Temp: 78.9 C    Complications: No apparent anesthesia complications

## 2016-08-11 NOTE — Transfer of Care (Deleted)
Immediate Anesthesia Transfer of Care Note  Patient: [name]  Procedure(s) Performed: Procedure(s): LAPAROSCOPIC GASTRIC SLEEVE RESECTION, UPPER ENDO (N/A)  Patient Location: PACU  Anesthesia Type:General  Level of Consciousness:  sedated, patient cooperative and responds to stimulation  Airway & Oxygen Therapy:Patient Spontanous Breathing and Patient connected to face mask oxgen  Post-op Assessment:  Report given to PACU RN and Post -op Vital signs reviewed and stable  Post vital signs:  Reviewed and stable  Last Vitals:  Vitals:   08/11/16 1120  BP: (!) 136/94  Pulse: 65  Resp: 18  Temp: 58.0 C    Complications: No apparent anesthesia complications

## 2016-08-11 NOTE — Interval H&P Note (Signed)
History and Physical Interval Note:  08/11/2016 12:57 PM  [name]  has presented today for surgery, with the diagnosis of Morbid Obesity, HTN, DMII  The various methods of treatment have been discussed with the patient and family. After consideration of risks, benefits and other options for treatment, the patient has consented to  Procedure(s): LAPAROSCOPIC GASTRIC SLEEVE RESECTION, UPPER ENDO (N/A) as a surgical intervention .  The patient's history has been reviewed, patient examined, no change in status, stable for surgery.  I have reviewed the patient's chart and labs.  Questions were answered to the patient's satisfaction.     Arta Bruce Atlas Kuc

## 2016-08-12 LAB — COMPREHENSIVE METABOLIC PANEL
ALBUMIN: 3.8 g/dL (ref 3.5–5.0)
ALK PHOS: 169 U/L — AB (ref 38–126)
ALT: 25 U/L (ref 14–54)
ANION GAP: 9 (ref 5–15)
AST: 21 U/L (ref 15–41)
BILIRUBIN TOTAL: 0.4 mg/dL (ref 0.3–1.2)
BUN: 16 mg/dL (ref 6–20)
CALCIUM: 9.3 mg/dL (ref 8.9–10.3)
CO2: 29 mmol/L (ref 22–32)
Chloride: 99 mmol/L — ABNORMAL LOW (ref 101–111)
Creatinine, Ser: 0.95 mg/dL (ref 0.44–1.00)
GFR calc Af Amer: >60 mL/min (ref >60)
GLUCOSE: 138 mg/dL — AB (ref 65–99)
Potassium: 3.6 mmol/L (ref 3.5–5.1)
Sodium: 137 mmol/L (ref 135–145)
TOTAL PROTEIN: 7.4 g/dL (ref 6.5–8.1)

## 2016-08-12 LAB — CBC WITH DIFFERENTIAL/PLATELET
BASOS PCT: 0 %
Basophils Absolute: 0 10*3/uL (ref 0.0–0.1)
Eosinophils Absolute: 0 10*3/uL (ref 0.0–0.7)
Eosinophils Relative: 0 %
HEMATOCRIT: 36.4 % (ref 36.0–46.0)
HEMOGLOBIN: 11.9 g/dL — AB (ref 12.0–15.0)
LYMPHS ABS: 1.2 10*3/uL (ref 0.7–4.0)
LYMPHS PCT: 11 %
MCH: 26.6 pg (ref 26.0–34.0)
MCHC: 32.7 g/dL (ref 30.0–36.0)
MCV: 81.4 fL (ref 78.0–100.0)
MONO ABS: 0.6 10*3/uL (ref 0.1–1.0)
MONOS PCT: 6 %
NEUTROS ABS: 8.9 10*3/uL — AB (ref 1.7–7.7)
NEUTROS PCT: 83 %
Platelets: 282 10*3/uL (ref 150–400)
RBC: 4.47 MIL/uL (ref 3.87–5.11)
RDW: 15.6 % — AB (ref 11.5–15.5)
WBC: 10.7 10*3/uL — ABNORMAL HIGH (ref 4.0–10.5)

## 2016-08-12 LAB — GLUCOSE, CAPILLARY
GLUCOSE-CAPILLARY: 97 mg/dL (ref 65–99)
Glucose-Capillary: 103 mg/dL — ABNORMAL HIGH (ref 65–99)

## 2016-08-12 MED ORDER — INSULIN ASPART 100 UNIT/ML ~~LOC~~ SOLN
0.0000 [IU] | Freq: Three times a day (TID) | SUBCUTANEOUS | Status: DC
  Administered 2016-08-12: 2 [IU] via SUBCUTANEOUS

## 2016-08-12 MED ORDER — SIMETHICONE 40 MG/0.6ML PO SUSP
40.0000 mg | Freq: Four times a day (QID) | ORAL | Status: DC | PRN
  Administered 2016-08-12: 40 mg via ORAL
  Filled 2016-08-12 (×3): qty 0.6

## 2016-08-12 NOTE — Progress Notes (Signed)
  Progress Note: Metabolic and Bariatric Surgery Service   Chief Complaint/Subjective: Some pain when drinking water  Objective: Vital signs in last 24 hours: Temp:  [97.4 F (36.3 C)-99 F (37.2 C)] 98.6 F (37 C) (05/02 0422) Pulse Rate:  [65-87] 83 (05/02 0422) Resp:  [13-18] 16 (05/02 0422) BP: (107-154)/(51-94) 133/85 (05/02 0422) SpO2:  [96 %-100 %] 100 % (05/02 0422) Weight:  [127 kg (280 lb)] 127 kg (280 lb) (05/01 1202)    Intake/Output from previous day: 05/01 0701 - 05/02 0700 In: 2588.8 [P.O.:160; I.V.:2428.8] Out: 485 [Urine:475; Blood:10] Intake/Output this shift: No intake/output data recorded.  Lungs: CTAB  Cardiovascular: RRR  Abd: soft, ATTP, incisions c/d/i  Extremities: no edema  Neuro: AOx4  Lab Results: CBC   Recent Labs  08/11/16 1526 08/12/16 0620  WBC 10.7* 10.7*  HGB 11.9*  12.1 11.9*  HCT 36.8  35.9* 36.4  PLT 290 282   BMET  Recent Labs  08/11/16 1526 08/12/16 0620  NA  --  137  K  --  3.6  CL  --  99*  CO2  --  29  GLUCOSE  --  138*  BUN  --  16  CREATININE 1.25* 0.95  CALCIUM  --  9.3   PT/INR No results for input(s): LABPROT, INR in the last 72 hours. ABG No results for input(s): PHART, HCO3 in the last 72 hours.  Invalid input(s): PCO2, PO2  Studies/Results:  Anti-infectives: Anti-infectives    Start     Dose/Rate Route Frequency Ordered Stop   08/11/16 1108  cefoTEtan in Dextrose 5% (CEFOTAN) IVPB 2 g     2 g Intravenous On call to O.R. 08/11/16 1108 08/11/16 1332      Medications: Scheduled Meds: . enoxaparin (LOVENOX) injection  30 mg Subcutaneous Q12H  . insulin aspart  0-15 Units Subcutaneous TID WC  . loratadine  10 mg Oral Daily  . nebivolol  5 mg Oral Daily  . pantoprazole (PROTONIX) IV  40 mg Intravenous QHS  . protein supplement shake  2 oz Oral Q2H   Continuous Infusions: . sodium chloride 75 mL/hr at 08/11/16 1839   PRN Meds:.oxyCODONE **AND** acetaminophen, acetaminophen, morphine  injection, ondansetron (ZOFRAN) IV  Assessment/Plan: Patient Active Problem List   Diagnosis Date Noted  . Morbid obesity ([place]) 08/11/2016  . Snoring 12/05/2015  . Arthritis 01/09/2015  . Type 2 diabetes mellitus with hyperglycemia, without long-term current use of insulin ([place]) 09/13/2014  . Hyperlipidemia with target LDL less than 130 09/13/2014  . Dermatitis, asteototic 09/05/2014  . Alkaline phosphatase elevation 04/25/2013  . Carpal tunnel syndrome 12/30/2012  . Other screening mammogram 10/10/2012  . Migraine 08/07/2011  . Low back pain radiating to right leg 04/16/2011  . Severe obesity (BMI >= 40) ([place]) 12/25/2010  . Routine general medical examination at a health care facility 09/16/2010  . Essential hypertension, benign 02/27/2009  . OSTEOARTHRITIS 02/27/2009   s/p Procedure(s): LAPAROSCOPIC GASTRIC SLEEVE RESECTION, UPPER ENDOSCOPY 08/11/2016 -continue ERAS protocol -[facility]  Disposition:  LOS: 1 day  The patient will be in the hospital for normal postop protocol  Mickeal Skinner, MD [phone] [facility]

## 2016-08-12 NOTE — Progress Notes (Signed)
Patient alert and oriented, pain is controlled. Patient is tolerating fluids, advanced to protein shake today, patient is tolerating well.  Reviewed Gastric sleeve discharge instructions with patient and patient is able to articulate understanding.  Provided information on BELT program, Support Group and [facility] outpatient pharmacy. All questions answered, will continue to monitor.  

## 2016-08-12 NOTE — Progress Notes (Signed)
Pt was discharged home today. Instructions were reviewed with patient, and questions were answered. Pt was taken to main entrance via wheelchair.   

## 2016-08-12 NOTE — Progress Notes (Addendum)
Patient alert and oriented, Post op day 1.  Provided support and encouragement.  Encouraged pulmonary toilet, ambulation and small sips of liquids.  All questions answered.  Will continue to monitor.  Protein shake began at 0930 by nursing staff.

## 2016-08-12 NOTE — Discharge Summary (Signed)
Physician Discharge Summary  [name] GGY:[id number] DOB: 11-20-1965 DOA: 08/11/2016  PCP: Scarlette Calico, MD  Admit date: 08/11/2016 Discharge date: 08/12/2016  Recommendations for Outpatient Follow-up:  1.  (include homehealth, outpatient follow-up instructions, specific recommendations for PCP to follow-up on, etc.)  Follow-up Information    Mickeal Skinner, MD. Go on 08/26/2016.   Specialty:  General Surgery Why:  @ [address] information: [place] [address] [phone]        Mickeal Skinner, MD Follow up.   Specialty:  General Surgery Contact information: [place]  [address] [phone]          Discharge Diagnoses:  Active Problems:   Morbid obesity ([place])   Surgical Procedure: Laparoscopic Sleeve Gastrectomy, upper endoscopy  Discharge Condition: Good Disposition: Home  Diet recommendation: Postoperative sleeve gastrectomy diet (liquids only)  Filed Weights   08/11/16 1202  Weight: 127 kg (280 lb)     Hospital Course:  The patient was admitted after undergoing laparoscopic sleeve gastrectomy. POD 0 she ambulated well. POD 1 she was started on the water diet protocol and tolerated 200 ml in the first shift. Once meeting the water amount she was advanced to bariatric protein shakes which they tolerated and were discharged home POD 1.  Treatments: surgery: laparoscopic sleeve gastrectomy  Discharge Instructions  Discharge Instructions    Ambulate hourly while awake    Complete by:  As directed    Call MD for:  difficulty breathing, headache or visual disturbances    Complete by:  As directed    Call MD for:  persistant dizziness or light-headedness    Complete by:  As directed    Call MD for:  persistant nausea and vomiting    Complete by:  As directed    Call MD for:  redness, tenderness, or signs of infection (pain, swelling, redness, odor or green/yellow discharge around  incision site)    Complete by:  As directed    Call MD for:  severe uncontrolled pain    Complete by:  As directed    Call MD for:  temperature >101 F    Complete by:  As directed    Diet bariatric full liquid    Complete by:  As directed    Discharge wound care:    Complete by:  As directed    Remove Bandaids tomorrow, ok to shower tomorrow. Steristrips may fall off in 1-3 weeks.   Incentive spirometry    Complete by:  As directed    Perform hourly while awake     Allergies as of 08/12/2016   No Known Allergies     Medication List    STOP taking these medications   naproxen 500 MG tablet Commonly known as:  NAPROSYN     TAKE these medications   cetirizine 10 MG tablet Commonly known as:  ZYRTEC Take 10 mg by mouth daily as needed for allergies. Reported on 10/02/2015   glucose blood test strip Commonly known as:  ONETOUCH VERIO Use BID   indapamide 2.5 MG tablet Commonly known as:  LOZOL Take 1 tablet (2.5 mg total) by mouth daily. Notes to patient:  Monitor Blood Pressure Daily and keep a log for primary care physician.  You may need to make changes to your medications with rapid weight loss.     metFORMIN 500 MG 24 hr tablet Commonly known as:  GLUCOPHAGE-XR Take 2 tablets (1,000 mg total) by  mouth 2 (two) times daily with a meal. What changed:  how much to take  when to take this   nebivolol 5 MG tablet Commonly known as:  BYSTOLIC Take 1 tablet (5 mg total) by mouth daily. Notes to patient:  Monitor Blood Pressure Daily and keep a log for primary care physician.  You may need to make changes to your medications with rapid weight loss.     ONETOUCH DELICA LANCETS 10G Misc Use 1x a day   ONETOUCH VERIO w/Device Kit 1 Act by Does not apply route 2 (two) times daily.   sitaGLIPtin 100 MG tablet Commonly known as:  JANUVIA Take 1 tablet (100 mg total) by mouth daily. Notes to patient:  Monitor Blood Sugar Frequently and keep a log for primary care  physician, you may need to adjust medication dosage with rapid weight loss.        Follow-up Information    Mickeal Skinner, MD. Go on 08/26/2016.   Specialty:  General Surgery Why:  @ [address] information: [place] [address] [phone]        Mickeal Skinner, MD Follow up.   Specialty:  General Surgery Contact information: [place] [address] [phone]            The results of significant diagnostics from this hospitalization (including imaging, microbiology, ancillary and laboratory) are listed below for reference.    Significant Diagnostic Studies: Dg Chest 2 View  Result Date: 07/14/2016 CLINICAL DATA:  Preop for gastric sleeve procedure EXAM: CHEST  2 VIEW COMPARISON:  02/19/2014 FINDINGS: Cardiomediastinal silhouette is stable. No infiltrate or pleural effusion. No pulmonary edema. Bony thorax is unremarkable. IMPRESSION: No active cardiopulmonary disease. Electronically Signed   By: Lahoma Crocker M.D.   On: 07/14/2016 09:56   Dg Ugi W/high Density W/kub  Result Date: 07/14/2016 CLINICAL DATA:  51 year old female pre gastric sleeve procedure. No complaints. Initial encounter. EXAM: UPPER GI SERIES WITH KUB TECHNIQUE: After obtaining a scout radiograph a routine upper GI series was performed using thin barium per protocol. FLUOROSCOPY TIME:  Fluoroscopy Time:  1 minutes and 36 seconds Radiation Exposure Index (if provided by the fluoroscopic device): 84.3 mGy COMPARISON:  09/14/2008 CT. FINDINGS: Scout view unremarkable. Normal primary esophageal stripping wave without esophageal obstructing or constricting lesion. Trace reflux occurs with change of patient position. Gastric contour within normal limits without ulceration or mass noted on this single contrast exam. Duodenal bulb of normal configuration without ulceration. Limited evaluation of the proximal small bowel appears unremarkable. IMPRESSION: Trace  gastroesophageal reflux with change of patient position otherwise negative single contrast upper GI series. Electronically Signed   By: Genia Del M.D.   On: 07/14/2016 10:48    Labs: Basic Metabolic Panel:  Recent Labs Lab 08/07/16 0851 08/11/16 1526 08/12/16 0620  NA 137  --  137  K 3.4*  --  3.6  CL 97*  --  99*  CO2 29  --  29  GLUCOSE 121*  --  138*  BUN 19  --  16  CREATININE 1.05* 1.25* 0.95  CALCIUM 9.9  --  9.3   Liver Function Tests:  Recent Labs Lab 08/07/16 0851 08/12/16 0620  AST 26 21  ALT 26 25  ALKPHOS 191* 169*  BILITOT 0.4 0.4  PROT 8.3* 7.4  ALBUMIN 4.2 3.8    CBC:  Recent Labs Lab 08/07/16 0851 08/11/16 1526 08/12/16 0620  WBC 8.9  10.7* 10.7*  NEUTROABS 4.9  --  8.9*  HGB 13.2 11.9*  12.1 11.9*  HCT 39.0 36.8  35.9* 36.4  MCV 80.6 81.4 81.4  PLT 297 290 282    CBG:  Recent Labs Lab 08/11/16 1115 08/11/16 1453 08/11/16 2051 08/12/16 1158 08/12/16 1648  GLUCAP 127* 143* 164* 97 103*    Active Problems:   Morbid obesity ([place])   Time coordinating discharge: <105mn

## 2016-08-14 ENCOUNTER — Other Ambulatory Visit: Payer: Self-pay | Admitting: *Deleted

## 2016-08-14 NOTE — Patient Outreach (Addendum)
[facility]) Care Management  08/14/2016  [name] [birth year] [id number]   Subjective:Received voicemail message from patient and requested call back. Telephone call to patient's home  / mobile number, no answer, left HIPAA compliant voicemail message, and requested call back.   Objective: Per chart review, patient hospitalized  08/11/16 - 08/12/16 for morbid obesity.   Status post laparoscopic Sleeve Gastrectomy on 08/11/16.   Patient also has a history of diabetes and hypertension.   Assessment: Received [facility] Transition of care referral on 08/11/16 via [employer] report.   Transition of care follow up pending patient contact.    Plan: RNCM will call patient for 3rd telephone outreach attempt, transition of care follow up, within 10 business days if no return call.   Petrina Melby H. Annia Friendly, BSN, [place] [facility] Telephonic CM Phone: [phone] Fax: [phone]

## 2016-08-14 NOTE — Patient Outreach (Signed)
[facility]) Care Management  08/14/2016  [name] 12-19-1965 [id number]   Subjective: Telephone call to patient's home  / mobile number, no answer, left HIPAA compliant voicemail message, and requested call back.   Objective: Per chart review, patient hospitalized  08/11/16 - 08/12/16 for morbid obesity.   Status post laparoscopic Sleeve Gastrectomy on 08/11/16.   Patient also has a history of diabetes and hypertension.   Assessment: Received UMR Transition of care referral on 08/11/16 via [employer] report.   Transition of care follow up pending patient contact.    Plan: RNCM will call patient for 2nd telephone outreach attempt, transition of care follow up, within 10 business days if no return call.   Kaysi Ourada H. Annia Friendly, BSN, [place] Management [facility] Telephonic CM Phone: [phone] Fax: [phone]

## 2016-08-17 ENCOUNTER — Other Ambulatory Visit: Payer: Self-pay | Admitting: *Deleted

## 2016-08-17 ENCOUNTER — Encounter: Payer: Self-pay | Admitting: Internal Medicine

## 2016-08-17 ENCOUNTER — Encounter: Payer: Self-pay | Admitting: *Deleted

## 2016-08-17 NOTE — Patient Outreach (Signed)
[facility]) Care Management  08/17/2016  [name] 04-Sep-1965 [id number]  Subjective:  Telephone call to patient's home / mobile number, spoke with patient, and HIPAA verified.  Discussed [name] Care Management UMR Transition of care follow up, patient voiced understanding, and is in agreement to follow up.   Patient states she is doing well, hurts a little bit when the liquids are going down being digested, and she was advised in preoperative teaching that this is to be expected.   She is aware of signs and symptoms to report to MD and has surgeon follow up appointment on 09/01/16. .   States she will call her primary MD's office to set up a hospital follow up appointment, to verify when her blood pressure and diabetes medications should be resumed.  States she was told to stop both blood pressure and diabetes medications prior to surgery but not when to resume.  States her blood pressures and blood sugars have been in the normal range since she has been discharged from the hospital.  Patient states she does not have the hospital indemnity plan.  Patient states she does not have any transition of care, care coordination, disease management, disease monitoring, transportation, community resource, or pharmacy needs at this time.   States she is very appreciative of the follow up and is in agreement to receive [facility] Management information.    Objective: Per chart review, patient hospitalized 08/11/16 - 08/12/16 for morbid obesity. Status post laparoscopic Sleeve Gastrectomy on 08/11/16. Patient also has a history of diabetes and hypertension.   Assessment: Received UMR Transition of care referral on 08/11/16 via [employer] report. Transition of care follow up completed, no care management needs, and will proceed with case closure.    Plan: RNCM will send patient successful outreach letter, [facility] pamphlet, and magnet. RNCM will send case closure due to follow up completed /  no care management needs request to [facility].   Destynie Toomey H. Annia Friendly, BSN, [place] Management [facility] Telephonic CM Phone: [phone] Fax: [phone]

## 2016-08-20 NOTE — Telephone Encounter (Signed)
Made discharge phone call to patient.Asking the following questions.    1. Do you have someone to care for you now that you are home?  independent 2. Are you having pain now that is not relieved by your pain medication?   3. Are you able to drink the recommended daily amount of fluids (48 ounces minimum/day) and protein (60-80 grams/day) as prescribed by the dietitian or nutritional counselor?  45 grams of protein, over 48 ounces of fluid 4. Are you taking the vitamins and minerals as prescribed?  Taking them 5. Do you have the "on call" number to contact your surgeon if you have a problem or question?  yes 6. Are your incisions free of redness, swelling or drainage? (If steri strips, address that these can fall off, shower as tolerated) they look good 7. Have your bowels moved since your surgery?  If not, are you passing gas?  Had bm 8. Are you up and walking 3-4 times per day?  yes 9. Were you provided your discharge medications before your surgery or before you were discharged from the hospital and are you taking them without problem?  Had medicaitons

## 2016-08-25 ENCOUNTER — Encounter: Payer: 59 | Attending: Family Medicine | Admitting: Skilled Nursing Facility1

## 2016-08-25 ENCOUNTER — Encounter: Payer: Self-pay | Admitting: Skilled Nursing Facility1

## 2016-08-25 DIAGNOSIS — Z7984 Long term (current) use of oral hypoglycemic drugs: Secondary | ICD-10-CM | POA: Diagnosis not present

## 2016-08-25 DIAGNOSIS — Z713 Dietary counseling and surveillance: Secondary | ICD-10-CM | POA: Insufficient documentation

## 2016-08-25 DIAGNOSIS — Z6841 Body Mass Index (BMI) 40.0 and over, adult: Secondary | ICD-10-CM | POA: Diagnosis not present

## 2016-08-25 DIAGNOSIS — E119 Type 2 diabetes mellitus without complications: Secondary | ICD-10-CM | POA: Insufficient documentation

## 2016-08-25 DIAGNOSIS — Z79899 Other long term (current) drug therapy: Secondary | ICD-10-CM | POA: Diagnosis not present

## 2016-08-25 DIAGNOSIS — I10 Essential (primary) hypertension: Secondary | ICD-10-CM | POA: Insufficient documentation

## 2016-08-25 NOTE — Progress Notes (Signed)
Bariatric Class:  Appt start time: 1530 end time:  1630.  2 Week Post-Operative Nutrition Class  Patient was seen on 08/25/2016 for Post-[facility]ative [facility]   Surgery date: Aug 11, 2016 Surgery type: Sleeve Gastrectomy  Start weight at [facility]: 292.3lbs Weight today: Pt came too late to get wt  TANITA  BODY COMP RESULTS  07/27/2016   BMI (kg/m^2) 55.9   Fat Mass (lbs) 165.4   Fat Free Mass (lbs) 130.6    The following the learning objectives were met by the patient during this course:  Identifies Phase 3A (Soft, High Proteins) Dietary Goals and will begin from 2 weeks post-operatively to 2 months post-operatively  Identifies appropriate sources of fluids and proteins   States protein recommendations and appropriate sources post-operatively  Identifies the need for appropriate texture modifications, mastication, and bite sizes when consuming solids  Identifies appropriate multivitamin and calcium sources post-operatively  Describes the need for physical activity post-operatively and will follow MD recommendations  States when to call healthcare provider regarding medication questions or post-operative complications  Handouts given during class include:  Phase 3A: Soft, High Protein Diet Handout  Follow-Up Plan: Patient will follow-up at [name] in 6 weeks for 2 month post-op nutrition visit for diet advancement per MD.

## 2016-08-27 ENCOUNTER — Encounter: Payer: Self-pay | Admitting: Internal Medicine

## 2016-08-27 ENCOUNTER — Ambulatory Visit (INDEPENDENT_AMBULATORY_CARE_PROVIDER_SITE_OTHER): Payer: 59 | Admitting: Internal Medicine

## 2016-08-27 VITALS — BP 122/88 | HR 108 | Wt 265.0 lb

## 2016-08-27 DIAGNOSIS — E1165 Type 2 diabetes mellitus with hyperglycemia: Secondary | ICD-10-CM | POA: Diagnosis not present

## 2016-08-27 NOTE — Progress Notes (Signed)
Patient ID: TAYAH IDROVO, female   DOB: 1965-08-09, 51 y.o.   MRN: [id number]   HPI: [name] is a 51 y.o.-year-old female, returning for f/u for DM2, dx in 09/2014, prediabetes since at least 2008,  non-insulin-dependent, uncontrolled, without complications. Last visit 5 mo ago.  Since last visit, she had gastric sleeve surgery on 08/11/2016. She lost 15 lbs since then, but already lost 12 lbs before the Sx on the preparatory diet. She feels great!  Last hemoglobin A1c was: Lab Results  Component Value Date   HGBA1C 7.2 (H) 08/07/2016   HGBA1C 7.1 (H) 03/24/2016   HGBA1C 8.1 12/05/2015   Pt was on: - Synjardy XR (metformin ER 1000 mg-Jardiance 5 mg) x 2 tablets daily - started in 11/2015 >> stopped as she had diarrhea with it  - Metformin ER 1000 mg 2x a day >> still some diarrhea with 2 tabs - stopped at the time of the Sx  Now off DM meds.  Pt checks sugars 2x a day >> great!: - am: 148 >> 70 x1, 105-141, 151 >> 80-90s - 2h after b'fast: 138, 173, 237, 239 >> n/c - before lunch: n/c >> 96-178 >> n/c - 2h after lunch: n/c >> 92-171, 182  - before dinner: 81 >> 140, 151 >> 80s - 2h after dinner: n/c  - bedtime: n/c - nighttime: n/c No lows. Lowest sugar was 81 >> 70 >> 82; ? hypoglycemia awareness Highest sugar was 173 >> 100.  Glucometer: One Touch Verio  Pt's meals are v. Small now >> lean meats, no veggies except legumes for now.  - No CKD, last BUN/creatinine:  Lab Results  Component Value Date   BUN 16 08/12/2016   BUN 19 08/07/2016   CREATININE 0.95 08/12/2016   CREATININE 1.25 (H) 08/11/2016  No ACEI. - last set of lipids: Lab Results  Component Value Date   CHOL 161 12/18/2015   HDL 43.20 12/18/2015   LDLCALC 98 12/18/2015   TRIG 99.0 12/18/2015   CHOLHDL 4 12/18/2015  No statin. - last eye exam was in 08/21/2015. No DR. - she denies numbness and tingling in her feet.  ROS: Constitutional: + weight loss, no fatigue, no subjective  hyperthermia, no subjective hypothermia Eyes: no blurry vision, no xerophthalmia ENT: no sore throat, no nodules palpated in throat, no dysphagia, no odynophagia, no hoarseness Cardiovascular: no CP/no SOB/no palpitations/no leg swelling Respiratory: no cough/no SOB/no wheezing Gastrointestinal: no N/no V/no D/no C/no acid reflux Musculoskeletal: no muscle aches/no joint aches Skin: no rashes, no hair loss Neurological: no tremors/no numbness/no tingling/no dizziness  I reviewed pt's medications, allergies, PMH, social hx, family hx, and changes were documented in the history of present illness. Otherwise, unchanged from my initial visit note.   Past Medical History:  Diagnosis Date  . Diabetes mellitus without complication ([place])   . Edema 04/25/2015  . Headache(784.0)   . Herniated disc   . History of colonic polyps   . Hypertension   . Obesity   . Osteoarthritis   . Shortness of breath 04/25/2015  . TIA (transient ischemic attack) 2007   hx of  . Vasculitis ([facility])    L leg - Polyarthritis  . Venous insufficiency    Past Surgical History:  Procedure Laterality Date  . ABDOMINAL HYSTERECTOMY    . LAPAROSCOPIC GASTRIC SLEEVE RESECTION N/A 08/11/2016   Procedure: LAPAROSCOPIC GASTRIC SLEEVE RESECTION, UPPER ENDOSCOPY;  Surgeon: Arta Bruce Kinsinger, MD;  Location: [facility];  Service: General;  Laterality:  N/A;   Social History   Social History  . Marital status: Single    Spouse name: N/A  . Number of children: 3   Occupational History  . Clinic scheduler for cardiology    Social History Main Topics  . Smoking status: Never Smoker  . Smokeless tobacco: Never Used  . Alcohol use 0.0 oz/week     Comment: occasionally  . Drug use: No  . Sexual activity: Yes    Birth control/ protection: Surgical   Current Outpatient Prescriptions on File Prior to Visit  Medication Sig Dispense Refill  . Blood Glucose Monitoring Suppl (ONETOUCH VERIO) w/Device KIT 1 Act by Does not apply  route 2 (two) times daily. 2 kit 0  . cetirizine (ZYRTEC) 10 MG tablet Take 10 mg by mouth daily as needed for allergies. Reported on 10/02/2015    . glucose blood (ONETOUCH VERIO) test strip Use BID 100 each 12  . indapamide (LOZOL) 2.5 MG tablet Take 1 tablet (2.5 mg total) by mouth daily. 90 tablet 1  . nebivolol (BYSTOLIC) 5 MG tablet Take 1 tablet (5 mg total) by mouth daily. 90 tablet 1  . ONETOUCH DELICA LANCETS 30Z MISC Use 1x a day 100 each 3  . metFORMIN (GLUCOPHAGE-XR) 500 MG 24 hr tablet Take 2 tablets (1,000 mg total) by mouth 2 (two) times daily with a meal. (Patient not taking: Reported on 08/17/2016) 120 tablet 1  . sitaGLIPtin (JANUVIA) 100 MG tablet Take 1 tablet (100 mg total) by mouth daily. (Patient not taking: Reported on 08/04/2016) 30 tablet 3   No current facility-administered medications on file prior to visit.    No Known Allergies Family History  Problem Relation Age of Onset  . Stroke Mother   . Hypertension Mother   . AAA (abdominal aortic aneurysm) Mother   . Hypertension Father   . Cancer Father        Prostate  . Arthritis Other   . Hypertension Other   . Stroke Other    PE: BP 122/88 (BP Location: Left Arm, Patient Position: Sitting)   Pulse (!) 108   Wt 265 lb (120.2 kg)   SpO2 98%   BMI 50.07 kg/m   Wt Readings from Last 3 Encounters:  08/27/16 265 lb (120.2 kg)  08/11/16 280 lb (127 kg)  08/07/16 284 lb 12.8 oz (129.2 kg)   Constitutional: obese, in NAD Eyes: PERRLA, EOMI, no exophthalmos ENT: moist mucous membranes, no thyromegaly, no cervical lymphadenopathy Cardiovascular: RRR, No MRG Respiratory: CTA B Gastrointestinal: abdomen soft, NT, ND, BS+ Musculoskeletal: no deformities, strength intact in all 4 Skin: moist, warm, + stasis dermatitis R>L Neurological: no tremor with outstretched hands, DTR normal in all 4  ASSESSMENT: 1. DM2, non-insulin-dependent, uncontrolled, without Long term complications, but with hyperglycemia  PLAN:   1. Patient with fairly recent diagnosis of diabetes, now in remission after her GBP surgery early this mo. She alsready lost a significant amount of weight and feels great! Her sugars are wonderful despite stopping all DM meds. We will continue her off the meds, but I advised her to still check sugars qd-qod and let me know if sugars increase.  - no changes necessary for now - I suggested to:  Patient Instructions  Stay off all diabetes medicines for now.  Check sugars every day >> let me know if they start to increase.  Please come back in 6 months.  - reviewed latest HbA1c: 7.2%. Next will likely be much better. - continue checking  sugars at different times of the day  - advised for yearly eye exams >> she is 1 year out >> due again - Return to clinic in 6 mo with sugar log    Philemon Kingdom, MD PhD [facility] Endocrinology

## 2016-08-27 NOTE — Patient Instructions (Signed)
Stay off all diabetes medicines for now.  Check sugars every day >> let me know if they start to increase.  Please come back in 6 months.

## 2016-09-24 ENCOUNTER — Ambulatory Visit: Payer: 59 | Admitting: Internal Medicine

## 2016-10-06 ENCOUNTER — Encounter: Payer: 59 | Attending: Family Medicine | Admitting: Registered"

## 2016-10-06 ENCOUNTER — Encounter: Payer: Self-pay | Admitting: Registered"

## 2016-10-06 DIAGNOSIS — Z79899 Other long term (current) drug therapy: Secondary | ICD-10-CM | POA: Diagnosis not present

## 2016-10-06 DIAGNOSIS — E119 Type 2 diabetes mellitus without complications: Secondary | ICD-10-CM | POA: Insufficient documentation

## 2016-10-06 DIAGNOSIS — E669 Obesity, unspecified: Secondary | ICD-10-CM

## 2016-10-06 DIAGNOSIS — Z713 Dietary counseling and surveillance: Secondary | ICD-10-CM | POA: Diagnosis not present

## 2016-10-06 DIAGNOSIS — Z7984 Long term (current) use of oral hypoglycemic drugs: Secondary | ICD-10-CM | POA: Diagnosis not present

## 2016-10-06 DIAGNOSIS — I10 Essential (primary) hypertension: Secondary | ICD-10-CM | POA: Diagnosis not present

## 2016-10-06 DIAGNOSIS — Z6841 Body Mass Index (BMI) 40.0 and over, adult: Secondary | ICD-10-CM | POA: Insufficient documentation

## 2016-10-06 NOTE — Progress Notes (Signed)
Follow-up visit:  8 Weeks Post-Operative Sleeve Gastrectomy Surgery  Medical Nutrition Therapy:  Appt start time: 4:50 end time:  5:35.  Primary concerns today: Post-operative Bariatric Surgery Nutrition Management.  Non scale victories: none stated  Surgery date: 08/11/2016 Surgery type: sleeve gastrectomy Start weight at [facility]: 292.3 lbs Weight today: 244.4 lbs Weight change: unable to calculate; pt arrived too late for previous appt Total weight lost: 47.9 lbs Weight loss goal: not stated  TANITA  BODY COMP RESULTS  07/27/2016 10/06/2016   BMI (kg/m^2) 55.9 46.2   Fat Mass (lbs) 165.4 123.4   Fat Free Mass (lbs) 130.6 121.0  Total Body Water (lbs) N/A 88.6    Pt states she doesn't like fat free cheese or chicken. Pt states she has not been feeling well the last 2 days. Pt states she is tired of drinking protein shakes, eating Triple Zero greek yogurt, eating beans; wants vegetables. Pt states she feels nauseous mostly but only "vomits spit". Pt states she has been chewing well.   Preferred Learning Style:   No preference indicated   Learning Readiness:   Ready  Change in progress  24-hr recall: B (AM): 1 [place] sausage (3g), 1 egg (6g) Snk (AM): none L (PM): chicken (7g), pinto beans (7g) Snk (PM): none  D (PM): hamburger meat (7g), beans (7g) Snk (PM): none  Fluid intake: water, decaf unsweet tea w/ spenda, twist; 44 oz.  Estimated total protein intake: about 37 g protein daily   Medications: See list Supplementation: sometimes  Using straws: no Drinking while eating: no Having you been chewing well: yes Chewing/swallowing difficulties: no Changes in vision: no Changes to mood/headaches: no Hair loss/Cahnges to skin/Changes to nails: no Any difficulty focusing or concentrating: no Sweating: no Dizziness/Lightheaded:  no Palpitations: no  Carbonated beverages: no N/V/D/C/GAS: yes; vomiting with nothing coming up; no; no; no Abdominal Pain: no Dumping  syndrome: no Last Lap-Band fill: N/A  Recent physical activity:  Recently started BELT 3x/day  Progress Towards Goal(s):  In progress.  Handouts given during visit include:  none   Nutritional Diagnosis:  NI-5.7.1 Inadequate protein intake As related to bariatric surgery post-op recommendations.  As evidenced by dietary recall of less than 60g of protein .    Intervention:  Nutrition education and counseling. RD educated pt on importance of meeting protein and fluid needs and how to increase daily intake. RD educated and counseled pt on a better way to take vitamins and supplements.   Goals: - Add in protein snack options between meals to increase protein intake to at least 60g a day. Snack options include: protein drink, greek yogurt, tuna, Quest protein chips, etc.   - Crush multivitamins into greek yogurt.  - TUMS Chewy bites hard shell as Ca supplements.  - Aim for at least 64 oz of fluid per day.  - Track your food and drink intake.   Teaching Method Utilized:  Visual Auditory Hands on  Barriers to learning/adherence to lifestyle change: none  Demonstrated degree of understanding via:  Teach Back   Monitoring/Evaluation:  Dietary intake, exercise, and body weight. Follow up in 2 weeks for 10 week post-op visit to reassess protein, fluid, multivitamin, and supplement intake.

## 2016-10-06 NOTE — Patient Instructions (Addendum)
-   Add in protein snack options between meals to increase protein intake to at least 60g a day. Snack options include: protein drink, greek yogurt, tuna, Quest protein chips, etc.    - Crush multivitamins into greek yogurt.   - TUMS Chewy bites hard shell as Ca supplements.   - Aim for at least 64 oz of fluid per day.   - Track your food and drink intake.

## 2016-10-21 ENCOUNTER — Encounter: Payer: Self-pay | Admitting: Registered"

## 2016-10-21 ENCOUNTER — Encounter: Payer: 59 | Attending: Family Medicine | Admitting: Registered"

## 2016-10-21 DIAGNOSIS — Z7984 Long term (current) use of oral hypoglycemic drugs: Secondary | ICD-10-CM | POA: Diagnosis not present

## 2016-10-21 DIAGNOSIS — Z79899 Other long term (current) drug therapy: Secondary | ICD-10-CM | POA: Insufficient documentation

## 2016-10-21 DIAGNOSIS — Z713 Dietary counseling and surveillance: Secondary | ICD-10-CM | POA: Insufficient documentation

## 2016-10-21 DIAGNOSIS — I10 Essential (primary) hypertension: Secondary | ICD-10-CM | POA: Insufficient documentation

## 2016-10-21 DIAGNOSIS — E119 Type 2 diabetes mellitus without complications: Secondary | ICD-10-CM | POA: Insufficient documentation

## 2016-10-21 DIAGNOSIS — Z6841 Body Mass Index (BMI) 40.0 and over, adult: Secondary | ICD-10-CM | POA: Diagnosis not present

## 2016-10-21 NOTE — Patient Instructions (Addendum)
Goals:  Follow Phase 3B: High Protein + Non-Starchy Vegetables  Eat 3-6 small meals/snacks, every 3-5 hrs  Increase lean protein foods to meet 60g goal  Increase fluid intake to 64oz +  Avoid drinking 15 minutes before, during and 30 minutes after eating  Aim for >30 min of physical activity daily  - Try Bariatric Advantage Ultra Multi with Iron.   - Increase protein snacks during the day.

## 2016-10-21 NOTE — Progress Notes (Signed)
Follow-up visit:  10 Weeks Post-Operative Sleeve Gastrectomy Surgery  Medical Nutrition Therapy:  Appt start time: 2:40 end time:  3:30  Primary concerns today: Post-operative Bariatric Surgery Nutrition Management.  Non scale victories: able to go up stairs better  Surgery date: 08/11/2016 Surgery type: sleeve gastrectomy Start weight at [facility]: 292.3 lbs Weight today: 244.1 lbs Weight change: 0.3 lbs loss from 244.4 lbs on 10/06/2016 Total weight lost: 48.2 lbs Weight loss goal: to be healthy  TANITA  BODY COMP RESULTS  07/27/2016 10/06/2016 10/21/2016   BMI (kg/m^2) 55.9 46.2 N/A   Fat Mass (lbs) 165.4 123.4    Fat Free Mass (lbs) 130.6 121.0   Total Body Water (lbs) N/A 88.6     Pt arrives feeling much better than previous visit 2 weeks ago. Pt states she thinks pork bacon made her sick. Pt reports she baked the bacon and tried to dry out as much grease as possible, but was still sick afterwards. Pt states she was able to walk 3.1 miles yesterday.  Pt states surgeon has cleared her to take naproxen 2x/day for leg pain and can switch to taking capsules as multivitamin. Pt reports she will try Bariatric Advantage Ultra Multi with Iron, taking 3 capsules/day. Pt reports checking BS randomly during the week: fasting and before bed (82-90).    Preferred Learning Style:   No preference indicated   Learning Readiness:   Ready  Change in progress  24-hr recall: B (AM): Premier Protein shake (30g)  Snk (AM): greek yogurt (15g) L (PM): protein shake (30g) or beef or pinto beans (7g) Snk (PM): none  D (PM): shake or pork or cube steak (21g)  Snk (PM): none  Fluid intake: water (48oz), protein shake (22oz); 70 oz  Estimated total protein intake: about 73g protein daily   Medications: See list Supplementation: 2 Bariatric Adv and 3 TUMS  Using straws: no Drinking while eating: no Having you been chewing well: yes Chewing/swallowing difficulties: no Changes in vision:  no Changes to mood/headaches: no Hair loss/Changes to skin/Changes to nails: no Any difficulty focusing or concentrating: no Sweating: no Dizziness/Lightheaded:  no Palpitations: no  Carbonated beverages: no N/V/D/C/GAS: no; no; no; no; no Abdominal Pain: no Dumping syndrome: no Last Lap-Band fill: N/A  Recent physical activity:  Walking 49mi about 3x/wk  Progress Towards Goal(s):  In progress.  Handouts given during visit include:  Phase IIIB: High Protein + NS vegetables   Nutritional Diagnosis:  NI-5.8.5 Inadeqate fiber intake As related to bariatric surgery post-op diet.  As evidenced by dietary recall of protein only and no fiber options.    Intervention:  Nutrition education and counseling.   Goals:  Follow Phase 3B: High Protein + Non-Starchy Vegetables  Eat 3-6 small meals/snacks, every 3-5 hrs  Increase lean protein foods to meet 60g goal  Increase fluid intake to 64oz +  Avoid drinking 15 minutes before, during and 30 minutes after eating  Aim for >30 min of physical activity daily  - Try Bariatric Advantage Ultra Multi with Iron.  - Increase protein snacks during the day.   Teaching Method Utilized:  Visual Auditory Hands on  Barriers to learning/adherence to lifestyle change: none  Demonstrated degree of understanding via:  Teach Back   Monitoring/Evaluation:  Dietary intake, exercise, and body weight. Follow up in 2 months for 4 month post-op visit to reassess protein, fluid, multivitamin, and supplement intake.

## 2016-10-21 NOTE — Progress Notes (Deleted)
Follow-up visit:  8 Weeks Post-Operative Sleeve Gastrectomy Surgery  Medical Nutrition Therapy:  Appt start time: 2:30 end time:  ***  Primary concerns today: Post-operative Bariatric Surgery Nutrition Management.  Non scale victories: none stated  Surgery date: 08/11/2016 Surgery type: sleeve gastrectomy Start weight at [facility]: 292.3 lbs Weight today: *** lbs Weight change: ***t Total weight lost: *** lbs Weight loss goal: not stated  TANITA  BODY COMP RESULTS  07/27/2016 10/06/2016 10/21/2016   BMI (kg/m^2) 55.9 46.2    Fat Mass (lbs) 165.4 123.4    Fat Free Mass (lbs) 130.6 121.0   Total Body Water (lbs) N/A 88.6     Pt states she doesn't like fat free cheese or chicken. Pt states she has not been feeling well the last 2 days. Pt states she is tired of drinking protein shakes, eating Triple Zero greek yogurt, eating beans; wants vegetables. Pt states she feels nauseous mostly but only "vomits spit". Pt states she has been chewing well.   Preferred Learning Style:   No preference indicated   Learning Readiness:   Ready  Change in progress  24-hr recall: B (AM): 1 [place] sausage (3g), 1 egg (6g) Snk (AM): none L (PM): chicken (7g), pinto beans (7g) Snk (PM): none  D (PM): hamburger meat (7g), beans (7g) Snk (PM): none  Fluid intake: water, decaf unsweet tea w/ spenda, twist; 44 oz.  Estimated total protein intake: about 37 g protein daily   Medications: See list Supplementation: sometimes  Using straws: no Drinking while eating: no Having you been chewing well: yes Chewing/swallowing difficulties: no Changes in vision: no Changes to mood/headaches: no Hair loss/Cahnges to skin/Changes to nails: no Any difficulty focusing or concentrating: no Sweating: no Dizziness/Lightheaded:  no Palpitations: no  Carbonated beverages: no N/V/D/C/GAS: yes; vomiting with nothing coming up; no; no; no Abdominal Pain: no Dumping syndrome: no Last Lap-Band fill:  N/A  Recent physical activity:  Recently started BELT 3x/day  Progress Towards Goal(s):  In progress.  Handouts given during visit include:  none   Nutritional Diagnosis:  NI-5.7.1 Inadequate protein intake As related to bariatric surgery post-op recommendations.  As evidenced by dietary recall of less than 60g of protein .    Intervention:  Nutrition education and counseling. RD educated pt on importance of meeting protein and fluid needs and how to increase daily intake. RD educated and counseled pt on a better way to take vitamins and supplements.   Goals: - Add in protein snack options between meals to increase protein intake to at least 60g a day. Snack options include: protein drink, greek yogurt, tuna, Quest protein chips, etc.   - Crush multivitamins into greek yogurt.  - TUMS Chewy bites hard shell as Ca supplements.  - Aim for at least 64 oz of fluid per day.  - Track your food and drink intake.   Teaching Method Utilized:  Visual Auditory Hands on  Barriers to learning/adherence to lifestyle change: none  Demonstrated degree of understanding via:  Teach Back   Monitoring/Evaluation:  Dietary intake, exercise, and body weight. Follow up in 2 weeks for 10 week post-op visit to reassess protein, fluid, multivitamin, and supplement intake.

## 2016-10-27 ENCOUNTER — Encounter: Payer: Self-pay | Admitting: Internal Medicine

## 2016-10-27 ENCOUNTER — Ambulatory Visit (INDEPENDENT_AMBULATORY_CARE_PROVIDER_SITE_OTHER): Payer: 59 | Admitting: Internal Medicine

## 2016-10-27 VITALS — BP 130/80 | HR 72 | Temp 98.3°F | Resp 16 | Ht 61.0 in | Wt 244.2 lb

## 2016-10-27 DIAGNOSIS — I872 Venous insufficiency (chronic) (peripheral): Secondary | ICD-10-CM | POA: Diagnosis not present

## 2016-10-27 DIAGNOSIS — I878 Other specified disorders of veins: Secondary | ICD-10-CM

## 2016-10-27 DIAGNOSIS — E1165 Type 2 diabetes mellitus with hyperglycemia: Secondary | ICD-10-CM

## 2016-10-27 LAB — POCT GLYCOSYLATED HEMOGLOBIN (HGB A1C): Hemoglobin A1C: 5.4

## 2016-10-27 MED ORDER — VASCULERA PO TABS: 1.0000 | ORAL_TABLET | Freq: Every day | ORAL | 11 refills | Status: DC

## 2016-10-27 NOTE — Progress Notes (Signed)
Subjective:  Patient ID: [name], female    DOB: 11-Apr-1966  Age: 51 y.o. MRN: [id number]  CC: Diabetes   HPI [name] presents for f/up - She is 3 months status post bariatric surgery and is loss 52 pounds. Today her only complaint is skin changes and heaviness in her lower extremities. She has discomfort at rest and denies claudication. She complains that the skin over both shins around the ankles has become darker and feels irritated. She has a history of chronic venous insufficiency and polyarteritis nodosa. She has not noticed any nodules over her lower extremities.  Outpatient Medications Prior to Visit  Medication Sig Dispense Refill  . Blood Glucose Monitoring Suppl (ONETOUCH VERIO) w/Device KIT 1 Act by Does not apply route 2 (two) times daily. (Patient not taking: Reported on 10/27/2016) 2 kit 0  . cetirizine (ZYRTEC) 10 MG tablet Take 10 mg by mouth daily as needed for allergies. Reported on 10/02/2015    . glucose blood (ONETOUCH VERIO) test strip Use BID (Patient not taking: Reported on 10/27/2016) 100 each 12  . ONETOUCH DELICA LANCETS 44I MISC Use 1x a day (Patient not taking: Reported on 10/27/2016) 100 each 3  . indapamide (LOZOL) 2.5 MG tablet Take 1 tablet (2.5 mg total) by mouth daily. (Patient not taking: Reported on 10/27/2016) 90 tablet 1  . metFORMIN (GLUCOPHAGE-XR) 500 MG 24 hr tablet Take 2 tablets (1,000 mg total) by mouth 2 (two) times daily with a meal. (Patient not taking: Reported on 08/17/2016) 120 tablet 1  . nebivolol (BYSTOLIC) 5 MG tablet Take 1 tablet (5 mg total) by mouth daily. (Patient not taking: Reported on 10/27/2016) 90 tablet 1  . ondansetron (ZOFRAN-ODT) 4 MG disintegrating tablet   0  . sitaGLIPtin (JANUVIA) 100 MG tablet Take 1 tablet (100 mg total) by mouth daily. (Patient not taking: Reported on 08/04/2016) 30 tablet 3   No facility-administered medications prior to visit.     ROS Review of Systems  Constitutional: Negative.   Negative for appetite change, diaphoresis, fatigue and unexpected weight change.  HENT: Negative.   Eyes: Negative.   Respiratory: Negative.  Negative for cough, chest tightness, shortness of breath and wheezing.   Cardiovascular: Negative for chest pain, palpitations and leg swelling.  Gastrointestinal: Negative for abdominal pain, constipation, diarrhea, nausea and vomiting.  Endocrine: Negative.   Genitourinary: Negative for difficulty urinating.  Musculoskeletal: Negative.  Negative for back pain, myalgias and neck pain.  Skin: Positive for color change. Negative for rash and wound.  Allergic/Immunologic: Negative.   Neurological: Negative.  Negative for dizziness and weakness.  Hematological: Negative for adenopathy. Does not bruise/bleed easily.  Psychiatric/Behavioral: Negative.     Objective:  BP 130/80 (BP Location: Left Arm, Patient Position: Sitting, Cuff Size: Normal)   Pulse 72   Temp 98.3 F (36.8 C) (Oral)   Resp 16   Ht 5' 1"  (1.549 m)   Wt 244 lb 4 oz (110.8 kg)   SpO2 99%   BMI 46.15 kg/m   BP Readings from Last 3 Encounters:  10/27/16 130/80  08/27/16 122/88  08/12/16 129/64    Wt Readings from Last 3 Encounters:  10/27/16 244 lb 4 oz (110.8 kg)  10/21/16 244 lb 1.6 oz (110.7 kg)  10/06/16 244 lb 6.4 oz (110.9 kg)    Physical Exam  Constitutional: She is oriented to person, place, and time. No distress.  HENT:  Mouth/Throat: Oropharynx is clear and moist. No oropharyngeal exudate.  Eyes: Conjunctivae  are normal. Right eye exhibits no discharge. Left eye exhibits no discharge. No scleral icterus.  Neck: Normal range of motion. Neck supple. No JVD present. No thyromegaly present.  Cardiovascular: Normal rate and regular rhythm.  Exam reveals no gallop.   No murmur heard. Pulmonary/Chest: Effort normal and breath sounds normal. No respiratory distress. She has no wheezes. She has no rales. She exhibits no tenderness.  Abdominal: Soft. Bowel sounds are  normal. She exhibits no distension and no mass. There is no tenderness. There is no rebound and no guarding.  Musculoskeletal: Normal range of motion. She exhibits no edema, tenderness or deformity.  Lymphadenopathy:    She has no cervical adenopathy.  Neurological: She is alert and oriented to person, place, and time.  Skin: Skin is warm and dry. Rash noted. She is not diaphoretic. No erythema.  Over both lower extremities, anteriorly and medially, there are large patches of brawny induration and hyperpigmentation, there is no edema. There are no wounds or ulcers. There is no erythema, exudate or weeping.  Vitals reviewed.   Lab Results  Component Value Date   WBC 10.7 (H) 08/12/2016   HGB 11.9 (L) 08/12/2016   HCT 36.4 08/12/2016   PLT 282 08/12/2016   GLUCOSE 138 (H) 08/12/2016   CHOL 161 12/18/2015   TRIG 99.0 12/18/2015   HDL 43.20 12/18/2015   LDLCALC 98 12/18/2015   ALT 25 08/12/2016   AST 21 08/12/2016   NA 137 08/12/2016   K 3.6 08/12/2016   CL 99 (L) 08/12/2016   CREATININE 0.95 08/12/2016   BUN 16 08/12/2016   CO2 29 08/12/2016   TSH 2.53 12/18/2015   INR 1.0 05/25/2007   HGBA1C 5.4 10/27/2016   MICROALBUR 1.5 12/05/2015    No results found.  Assessment & Plan:   Sonja was seen today for diabetes.  Diagnoses and all orders for this visit:  Venous stasis syndrome- To relieve her symptoms and prevent complications I have asked her to start taking vasculera and to use clobetasol ointment to the affected areas. -     Dietary Management Product (VASCULERA) TABS; Take 1 capsule by mouth daily. -     clobetasol ointment (TEMOVATE) 0.05 %; Apply 1 application topically 2 (two) times daily.  Type 2 diabetes mellitus with hyperglycemia, without long-term current use of insulin ([place])- she has discontinued all of her oral diabetic agents and her A1c is down to 5.4% status post bariatric surgery. She was praised for the success. -     POCT glycosylated hemoglobin (Hb  A1C)   I have discontinued [name]'s indapamide, metFORMIN, nebivolol, sitaGLIPtin, and ondansetron. I am also having her start on VASCULERA and clobetasol ointment. Additionally, I am having her maintain her cetirizine, glucose blood, ONETOUCH VERIO, ONETOUCH DELICA LANCETS 56L, and multivitamin.  Meds ordered this encounter  Medications  . Multiple Vitamin (MULTIVITAMIN) tablet    Sig: Take 1 tablet by mouth daily.  . Dietary Management Product (VASCULERA) TABS    Sig: Take 1 capsule by mouth daily.    Dispense:  30 tablet    Refill:  11  . clobetasol ointment (TEMOVATE) 0.05 %    Sig: Apply 1 application topically 2 (two) times daily.    Dispense:  45 g    Refill:  1     Follow-up: Return if symptoms worsen or fail to improve.  Scarlette Calico, MD

## 2016-10-27 NOTE — Patient Instructions (Signed)

## 2016-10-28 MED ORDER — CLOBETASOL PROPIONATE 0.05 % EX OINT: 1.0000 "application " | TOPICAL_OINTMENT | Freq: Two times a day (BID) | CUTANEOUS | 1 refills | Status: DC

## 2016-11-11 ENCOUNTER — Telehealth: Payer: Self-pay | Admitting: Skilled Nursing Facility1

## 2016-11-11 NOTE — Telephone Encounter (Signed)
Pt called asking if she could have frozen yogurt.  Dietitian advised her to read the label and if it was 9 or less grams of sugar and 15 grams or less of carbohydrates it may be okay if she could tolerate it.

## 2016-11-21 DIAGNOSIS — H5213 Myopia, bilateral: Secondary | ICD-10-CM | POA: Diagnosis not present

## 2016-11-21 DIAGNOSIS — H524 Presbyopia: Secondary | ICD-10-CM | POA: Diagnosis not present

## 2016-11-21 DIAGNOSIS — H52223 Regular astigmatism, bilateral: Secondary | ICD-10-CM | POA: Diagnosis not present

## 2016-12-03 ENCOUNTER — Other Ambulatory Visit: Payer: Self-pay

## 2016-12-03 MED ORDER — GLUCOSE BLOOD VI STRP: ORAL_STRIP | 5 refills | Status: DC

## 2016-12-03 MED ORDER — ACCU-CHEK FASTCLIX LANCETS MISC: 5 refills | Status: DC

## 2016-12-10 DIAGNOSIS — E669 Obesity, unspecified: Secondary | ICD-10-CM | POA: Diagnosis not present

## 2016-12-10 DIAGNOSIS — R69 Illness, unspecified: Secondary | ICD-10-CM | POA: Diagnosis not present

## 2016-12-10 DIAGNOSIS — Z9884 Bariatric surgery status: Secondary | ICD-10-CM | POA: Diagnosis not present

## 2016-12-10 DIAGNOSIS — K912 Postsurgical malabsorption, not elsewhere classified: Secondary | ICD-10-CM | POA: Diagnosis not present

## 2016-12-24 ENCOUNTER — Encounter: Payer: Self-pay | Admitting: Skilled Nursing Facility1

## 2016-12-24 ENCOUNTER — Encounter: Payer: 59 | Attending: Family Medicine | Admitting: Skilled Nursing Facility1

## 2016-12-24 DIAGNOSIS — E119 Type 2 diabetes mellitus without complications: Secondary | ICD-10-CM | POA: Diagnosis not present

## 2016-12-24 DIAGNOSIS — Z713 Dietary counseling and surveillance: Secondary | ICD-10-CM | POA: Insufficient documentation

## 2016-12-24 DIAGNOSIS — Z79899 Other long term (current) drug therapy: Secondary | ICD-10-CM | POA: Diagnosis not present

## 2016-12-24 DIAGNOSIS — Z6841 Body Mass Index (BMI) 40.0 and over, adult: Secondary | ICD-10-CM | POA: Insufficient documentation

## 2016-12-24 DIAGNOSIS — Z7984 Long term (current) use of oral hypoglycemic drugs: Secondary | ICD-10-CM | POA: Insufficient documentation

## 2016-12-24 DIAGNOSIS — I10 Essential (primary) hypertension: Secondary | ICD-10-CM | POA: Diagnosis not present

## 2016-12-24 NOTE — Patient Instructions (Addendum)
-  Try to have 1 snack in a day of protein 4 days a week  -9 grams or less of sugar/fat and 15 grams or less of carbohydrate   -Aim for 5-6 bottles of water  -Try 4 ounces of kefir in the morning   -Get into water aerobics and resistance training   -Try vinegar or yellow mustard

## 2016-12-24 NOTE — Progress Notes (Signed)
Sleeve Gastrectomy Surgery  Medical Nutrition Therapy:  Appt start time: 2:40 end time:  3:30  Primary concerns today: Post-operative Bariatric Surgery Nutrition Management.  Non scale victories: able to go up stairs better  Surgery date: 08/11/2016 Surgery type: sleeve gastrectomy Start weight at [facility]: 292.3 lbs Weight today: 222.6 lbs Weight change: 22 pounds Weight loss goal: to be healthy  TANITA  BODY COMP RESULTS  07/27/2016 10/06/2016 10/21/2016 12/24/2016   BMI (kg/m^2) 55.9 46.2 N/A 42.1   Fat Mass (lbs) 165.4 123.4  98.2   Fat Free Mass (lbs) 130.6 121.0  124.4  Total Body Water (lbs) N/A 88.6  90.2    Pt states she Gets free food from drug reps every lunch. Pt state she is still checking her blood sugar randomly and A1C 5.2. Pt states the BELT program is too early. Pt states she gets a salt craving every now and then.   Preferred Learning Style:   No preference indicated   Learning Readiness:   Ready  Change in progress  24-hr recall: B (AM): scoop of egg and [place] bacon (21g)  Snk (AM): greek yogurt (15g)-sometimes  L (PM): salad and chicken (not always chicken) Snk (PM): none  D (PM): vegetables and chicken or steak Snk (PM): none  Fluid intake: water, rarely unsweet tea: 3-4 20 ounce bottles Estimated total protein intake: about 73g protein daily   Medications: See list Supplementation: 2 Bariatric Adv capsules and 3 TUMS  Using straws: no Drinking while eating: no Having you been chewing well: yes Chewing/swallowing difficulties: no Changes in vision: no Changes to mood/headaches: no Hair loss/Changes to skin/Changes to nails: no Any difficulty focusing or concentrating: no Sweating: no Dizziness/Lightheaded:  no Palpitations: no  Carbonated beverages: no N/V/D/C/GAS: maybe 1 bowel movement a week  Abdominal Pain: no Dumping syndrome: no  Recent physical activity:  Walking 4mi about 3x/wk  Progress Towards Goal(s):  In  progress.  Handouts given during visit include:  Phase IIIB: High Protein + NS vegetables   Nutritional Diagnosis:  NI-5.8.5 Inadeqate fiber intake As related to bariatric surgery post-op diet.  As evidenced by dietary recall of protein only and no fiber options.    Intervention:  Nutrition education and counseling.   Goals: -Try to have 1 snack in a day of protein 4 days a week -9 grams or less of sugar/fat and 15 grams or less of carbohydrate  -Aim for 5-6 bottles of water -Try 4 ounces of kefir in the morning  -Get into water aerobics and resistance training  -Try vinegar or yellow mustard   Teaching Method Utilized:  Visual Auditory Hands on  Barriers to learning/adherence to lifestyle change: none  Demonstrated degree of understanding via:  Teach Back   Monitoring/Evaluation:  Dietary intake, exercise, and body weight.

## 2016-12-25 ENCOUNTER — Ambulatory Visit: Payer: Self-pay | Admitting: Registered"

## 2017-01-01 ENCOUNTER — Encounter: Payer: Self-pay | Admitting: Internal Medicine

## 2017-01-18 ENCOUNTER — Encounter: Payer: Self-pay | Admitting: Internal Medicine

## 2017-01-20 ENCOUNTER — Ambulatory Visit (INDEPENDENT_AMBULATORY_CARE_PROVIDER_SITE_OTHER): Payer: 59 | Admitting: Family Medicine

## 2017-01-20 ENCOUNTER — Encounter: Payer: Self-pay | Admitting: Family Medicine

## 2017-01-20 VITALS — BP 132/66 | HR 62 | Temp 98.1°F | Ht 61.0 in | Wt 222.0 lb

## 2017-01-20 DIAGNOSIS — M5441 Lumbago with sciatica, right side: Secondary | ICD-10-CM | POA: Diagnosis not present

## 2017-01-20 DIAGNOSIS — M5442 Lumbago with sciatica, left side: Secondary | ICD-10-CM | POA: Insufficient documentation

## 2017-01-20 MED ORDER — MELOXICAM 15 MG PO TABS: 15.0000 mg | ORAL_TABLET | Freq: Every day | ORAL | 0 refills | Status: DC

## 2017-01-20 MED ORDER — CYCLOBENZAPRINE HCL 5 MG PO TABS: 5.0000 mg | ORAL_TABLET | Freq: Three times a day (TID) | ORAL | 1 refills | Status: DC | PRN

## 2017-01-20 NOTE — Progress Notes (Signed)
[name] - 51 y.o. female MRN [id number]  Date of birth: 1965/12/11  SUBJECTIVE:  Including CC & ROS.  Chief Complaint  Patient presents with  . Back Pain    started one week ago-pain located at mid-lower and pain extends down her right thigh-denies injury    [name] is a 51 year old female presenting with right-sided lower back pain with some sciatic symptoms that extend to the lateral aspect of her right mid thigh. She denies any specific inciting event. The pain is moderate to severe in nature. She has tried ibuprofen. Pain is worse with getting up from a seated position. Has had similar symptoms in the past. Pain seems to be getting worse. Denies any numbness or tingling. Denies any urinary incontinence. Pain is sharp and stabbing in nature.   I have independently reviewed her lumbar spin x-rays from 2013 which showed minimal degenerative changes. Review of the lab work from 10/27/16 shows a A1c of 5.4.MRI of her lumbar spine from February 2013 shows a mild lumbar spondylosis without stenosis and a small left paracentral L4-L5 disc protrusion without neural compression.  Review of Systems  Constitutional: Negative for fever.  Musculoskeletal: Positive for back pain. Negative for gait problem.  Skin: Negative for color change.  Neurological: Negative for weakness and numbness.    HISTORY: Past Medical, Surgical, Social, and Family History Reviewed & Updated per EMR.   Pertinent Historical Findings include:  Past Medical History:  Diagnosis Date  . Diabetes mellitus without complication ([place])   . Edema 04/25/2015  . Headache(784.0)   . Herniated disc   . History of colonic polyps   . Hypertension   . Obesity   . Osteoarthritis   . Shortness of breath 04/25/2015  . TIA (transient ischemic attack) 2007   hx of  . Vasculitis (H[facility])    L leg - Polyarthritis  . Venous insufficiency     Past Surgical History:  Procedure Laterality Date  . ABDOMINAL HYSTERECTOMY    .  LAPAROSCOPIC GASTRIC SLEEVE RESECTION N/A 08/11/2016   Procedure: LAPAROSCOPIC GASTRIC SLEEVE RESECTION, UPPER ENDOSCOPY;  Surgeon: Arta Bruce Kinsinger, MD;  Location: [facility];  Service: General;  Laterality: N/A;    No Known Allergies  Family History  Problem Relation Age of Onset  . Stroke Mother   . Hypertension Mother   . AAA (abdominal aortic aneurysm) Mother   . Hypertension Father   . Cancer Father        Prostate  . Arthritis Other   . Hypertension Other   . Stroke Other      Social History   Social History  . Marital status: Single    Spouse name: N/A  . Number of children: N/A  . Years of education: N/A   Occupational History  . Not on file.   Social History Main Topics  . Smoking status: Never Smoker  . Smokeless tobacco: Never Used  . Alcohol use No  . Drug use: No  . Sexual activity: Yes    Birth control/ protection: Surgical   Other Topics Concern  . Not on file   Social History Narrative  . No narrative on file     PHYSICAL EXAM:  VS: BP 132/66 (BP Location: Left Arm, Patient Position: Sitting, Cuff Size: Normal)   Pulse 62   Temp 98.1 F (36.7 C) (Oral)   Ht 5\' 1"  (1.549 m)   Wt 222 lb (100.7 kg)   SpO2 99%   BMI 41.95 kg/m  Physical  Exam Gen: NAD, alert, cooperative with exam, well-appearing ENT: normal lips, normal nasal mucosa,  Eye: normal EOM, normal conjunctiva and lids CV:  no edema, +2 pedal pulses   Resp: no accessory muscle use, non-labored,  GI: no masses or tenderness, no hernia  Skin: no rashes, no areas of induration  Neuro: normal tone, normal sensation to touch Psych:  normal insight, alert and oriented MSK:  Back Exam:  Inspection: Unremarkable  Some tenderness to palpation of the right paraspinal muscles. No tenderness to palpation of the lumbar spine, greater trochanter, or SI joint. Normal internal rotation and external rotation of the hips bilaterally. Normal strength with hip flexion. Weakness with hip  abduction Strength at foot: Plantar-flexion: 5/5 Dorsi-flexion: 5/5 Eversion: 5/5 Inversion: 5/5  Gait unremarkable. SLR laying: Negative  Neurovascularly intact    ASSESSMENT & PLAN:   Acute right-sided low back pain with right-sided sciatica Has some muscle spasm in the lower back as well as some radicular symptoms down the mid thigh. No red flags today - Meloxicam and Flexeril provided - Counseled on home exercise therapy - If no improvement consider steroids versus referral to physical therapy.

## 2017-01-20 NOTE — Assessment & Plan Note (Signed)
Has some muscle spasm in the lower back as well as some radicular symptoms down the mid thigh. No red flags today - Meloxicam and Flexeril provided - Counseled on home exercise therapy - If no improvement consider steroids versus referral to physical therapy.

## 2017-01-20 NOTE — Patient Instructions (Signed)
Thank you for coming in,   Please try the muscle relaxer and anti-inflammatory.   Please follow up with me if you don't have any improvement of your pain.    Please feel free to call with any questions or concerns at any time, at [phone]. --Dr. Raeford Razor

## 2017-02-22 ENCOUNTER — Encounter (HOSPITAL_COMMUNITY): Payer: Self-pay

## 2017-02-22 ENCOUNTER — Emergency Department (HOSPITAL_COMMUNITY)
Admission: EM | Admit: 2017-02-22 | Discharge: 2017-02-22 | Disposition: A | Discharge status: Home / Self Care | Payer: 59 | Attending: Emergency Medicine | Admitting: Emergency Medicine

## 2017-02-22 ENCOUNTER — Telehealth: Payer: Self-pay | Admitting: Internal Medicine

## 2017-02-22 ENCOUNTER — Encounter: Payer: Self-pay | Admitting: Internal Medicine

## 2017-02-22 ENCOUNTER — Other Ambulatory Visit: Payer: Self-pay

## 2017-02-22 DIAGNOSIS — E1165 Type 2 diabetes mellitus with hyperglycemia: Secondary | ICD-10-CM | POA: Insufficient documentation

## 2017-02-22 DIAGNOSIS — H5712 Ocular pain, left eye: Secondary | ICD-10-CM | POA: Insufficient documentation

## 2017-02-22 DIAGNOSIS — R51 Headache: Secondary | ICD-10-CM | POA: Diagnosis not present

## 2017-02-22 DIAGNOSIS — Z7984 Long term (current) use of oral hypoglycemic drugs: Secondary | ICD-10-CM | POA: Diagnosis not present

## 2017-02-22 DIAGNOSIS — I10 Essential (primary) hypertension: Secondary | ICD-10-CM | POA: Diagnosis not present

## 2017-02-22 DIAGNOSIS — R519 Headache, unspecified: Secondary | ICD-10-CM

## 2017-02-22 LAB — CBC WITH DIFFERENTIAL/PLATELET
Basophils Absolute: 0 10*3/uL (ref 0.0–0.1)
Basophils Relative: 0 %
Eosinophils Absolute: 0.3 10*3/uL (ref 0.0–0.7)
Eosinophils Relative: 4 %
HCT: 35.9 % — ABNORMAL LOW (ref 36.0–46.0)
HEMOGLOBIN: 12 g/dL (ref 12.0–15.0)
LYMPHS ABS: 2.5 10*3/uL (ref 0.7–4.0)
LYMPHS PCT: 33 %
MCH: 27.6 pg (ref 26.0–34.0)
MCHC: 33.4 g/dL (ref 30.0–36.0)
MCV: 82.7 fL (ref 78.0–100.0)
Monocytes Absolute: 0.6 10*3/uL (ref 0.1–1.0)
Monocytes Relative: 8 %
NEUTROS ABS: 4.3 10*3/uL (ref 1.7–7.7)
NEUTROS PCT: 55 %
Platelets: 290 10*3/uL (ref 150–400)
RBC: 4.34 MIL/uL (ref 3.87–5.11)
RDW: 15.7 % — ABNORMAL HIGH (ref 11.5–15.5)
WBC: 7.6 10*3/uL (ref 4.0–10.5)

## 2017-02-22 LAB — URINALYSIS, ROUTINE W REFLEX MICROSCOPIC
Bilirubin Urine: NEGATIVE
GLUCOSE, UA: NEGATIVE mg/dL
HGB URINE DIPSTICK: NEGATIVE
Ketones, ur: NEGATIVE mg/dL
LEUKOCYTES UA: NEGATIVE
Nitrite: NEGATIVE
PROTEIN: NEGATIVE mg/dL
SPECIFIC GRAVITY, URINE: 1.021 (ref 1.005–1.030)
pH: 5 (ref 5.0–8.0)

## 2017-02-22 LAB — BASIC METABOLIC PANEL
ANION GAP: 8 (ref 5–15)
BUN: 15 mg/dL (ref 6–20)
CHLORIDE: 104 mmol/L (ref 101–111)
CO2: 24 mmol/L (ref 22–32)
Calcium: 9.2 mg/dL (ref 8.9–10.3)
Creatinine, Ser: 0.99 mg/dL (ref 0.44–1.00)
Glucose, Bld: 89 mg/dL (ref 65–99)
POTASSIUM: 3.8 mmol/L (ref 3.5–5.1)
SODIUM: 136 mmol/L (ref 135–145)

## 2017-02-22 MED ORDER — TETRACAINE HCL 0.5 % OP SOLN
2.0000 [drp] | Freq: Once | OPHTHALMIC | Status: AC
  Administered 2017-02-22: 2 [drp] via OPHTHALMIC
  Filled 2017-02-22: qty 4

## 2017-02-22 MED ORDER — ACETAMINOPHEN 500 MG PO TABS
1000.0000 mg | ORAL_TABLET | Freq: Once | ORAL | Status: AC
  Administered 2017-02-22: 1000 mg via ORAL
  Filled 2017-02-22: qty 2

## 2017-02-22 NOTE — ED Provider Notes (Signed)
Emergency Department Provider Note   I have reviewed the triage vital signs and the nursing notes.   HISTORY  Chief Complaint Hypertension and Headache   HPI [name] is a 51 y.o. female with PMH of DM, HTN, and TIA presents to the emergency department for evaluation of headache and elevated blood pressure.  The patient states that she had gradual onset headache focused behind her left eye and left lateral scalp.  She describes it as throbbing and gradually worsening.  She noticed some double vision or blurry vision.  No eye redness.  No associated weakness or numbness in the arms, legs, face.  The patient was at work and had a colleague take her blood pressure and initially found it to be normal to slightly elevated in the 258N systolic.  She called to speak to her primary care physician who advised her to present to the emergency department.  She took her blood pressure for a second time and found it to be in the 277O systolic.  Patient did take some Motrin for the headache and notes that her headache symptoms have started to improve.  No nausea or vomiting.  No difficulty walking.  She has a remote history of hypertension but this resolved after her gastric sleeve surgery.     Past Medical History:  Diagnosis Date  . Diabetes mellitus without complication ([place])   . Edema 04/25/2015  . Headache(784.0)   . Herniated disc   . History of colonic polyps   . Hypertension   . Obesity   . Osteoarthritis   . Shortness of breath 04/25/2015  . TIA (transient ischemic attack) 2007   hx of  . Vasculitis ([facility])    L leg - Polyarthritis  . Venous insufficiency     Patient Active Problem List   Diagnosis Date Noted  . Acute right-sided low back pain with right-sided sciatica 01/20/2017  . Venous stasis syndrome 10/27/2016  . Snoring 12/05/2015  . Arthritis 01/09/2015  . Type 2 diabetes mellitus with hyperglycemia, without long-term current use of insulin ([place]) 09/13/2014  .  Hyperlipidemia with target LDL less than 130 09/13/2014  . Dermatitis, asteototic 09/05/2014  . Alkaline phosphatase elevation 04/25/2013  . Carpal tunnel syndrome 12/30/2012  . Other screening mammogram 10/10/2012  . Migraine 08/07/2011  . Low back pain radiating to right leg 04/16/2011  . Severe obesity (BMI >= 40) ([place]) 12/25/2010  . Routine general medical examination at a health care facility 09/16/2010  . Essential hypertension, benign 02/27/2009  . OSTEOARTHRITIS 02/27/2009    Past Surgical History:  Procedure Laterality Date  . ABDOMINAL HYSTERECTOMY      Current Outpatient Rx  . Order #: [id number] Class: Historical Med  . Order #: [id number] Class: Historical Med  . Order #: [id number] Class: Normal  . Order #: [id number] Class: Normal    Allergies Patient has no known allergies.  Family History  Problem Relation Age of Onset  . Stroke Mother   . Hypertension Mother   . AAA (abdominal aortic aneurysm) Mother   . Hypertension Father   . Cancer Father        Prostate  . Arthritis Other   . Hypertension Other   . Stroke Other     Social History Social History   Tobacco Use  . Smoking status: Never Smoker  . Smokeless tobacco: Never Used  Substance Use Topics  . Alcohol use: No    Alcohol/week: 0.0 oz  . Drug use: No    Review of Systems  Constitutional: No fever/chills Eyes: No visual changes. ENT: No sore throat. Positive left eye pain and twitching.  Cardiovascular: Denies chest pain. Respiratory: Denies shortness of breath. Gastrointestinal: No abdominal pain.  No nausea, no vomiting.  No diarrhea.  No constipation. Genitourinary: Negative for dysuria. Musculoskeletal: Negative for back pain. Skin: Negative for rash. Neurological: Negative for focal weakness or numbness. Positive HA.   10-point ROS otherwise negative.  ____________________________________________   PHYSICAL EXAM:  VITAL SIGNS: ED Triage Vitals  Enc Vitals Group     BP  02/22/17 1613 (!) 182/97     Pulse Rate 02/22/17 1613 71     Resp 02/22/17 1613 16     Temp 02/22/17 1613 97.9 F (36.6 C)     Temp Source 02/22/17 1613 Oral     SpO2 02/22/17 1613 97 %     Weight 02/22/17 1615 204 lb (92.5 kg)     Height 02/22/17 1615 5\' 1"  (1.549 m)   Constitutional: Alert and oriented. Well appearing and in no acute distress. Eyes: Conjunctivae are normal. PERRL. EOMI. Normal fundoscopic exam. IOP 16 in the right and 15 on the left.  Head: Atraumatic. Nose: No congestion/rhinnorhea. Mouth/Throat: Mucous membranes are moist.  Oropharynx non-erythematous. Neck: No stridor.   Cardiovascular: Normal rate, regular rhythm. Good peripheral circulation. Grossly normal heart sounds.   Respiratory: Normal respiratory effort.  No retractions. Lungs CTAB. Gastrointestinal: Soft and nontender. No distention.  Musculoskeletal: No lower extremity tenderness nor edema. No gross deformities of extremities. Neurologic:  Normal speech and language. No gross focal neurologic deficits are appreciated. Normal CN exam 2-12. Normal finger-to-nose testing. Normal gait.  Skin:  Skin is warm, dry and intact. No rash noted. ____________________________________________   LABS (all labs ordered are listed, but only abnormal results are displayed)  Labs Reviewed  CBC WITH DIFFERENTIAL/PLATELET - Abnormal; Notable for the following components:      Result Value   HCT 35.9 (*)    RDW 15.7 (*)    All other components within normal limits  BASIC METABOLIC PANEL  URINALYSIS, ROUTINE W REFLEX MICROSCOPIC   ____________________________________________  EKG   EKG Interpretation  Date/Time:  Monday February 22 2017 17:13:15 EST Ventricular Rate:  72 PR Interval:    QRS Duration: 91 QT Interval:  395 QTC Calculation: 433 R Axis:   0 Text Interpretation:  Sinus rhythm Low voltage, precordial leads No STEMI.  Confirmed by Nanda Quinton [phone]) on 02/22/2017 6:10:12 PM       ____________________________________________   PROCEDURES  Procedure(s) performed:   Procedures  None ____________________________________________   INITIAL IMPRESSION / ASSESSMENT AND PLAN / ED COURSE  Pertinent labs & imaging results that were available during my care of the patient were reviewed by me and considered in my medical decision making (see chart for details).  Patient presents to the emergency department for evaluation of left eye pain with associated headache and elevated blood pressure.  Patient has a remote history of elevated blood pressure but not on medication since her sleeve gastrectomy.  No focal neurological deficits to necessitate CT imaging at this time.  No sudden onset, maximal intensity headache.  Plan for screening labs to evaluate for endorgan damage related to sudden increased blood pressure but suspect this may have been in response to pain.  Plan to check intraocular pressures and reassess after Tylenol.  06:10 PM Patient headache resolving with Tylenol.  Intraocular pressure tested and is normal bilaterally.  Labs unremarkable.  Plan to have the patient follow with  her primary care physician to discuss her elevated blood pressure.  At this time, I do not feel there is any life-threatening condition present. I have reviewed and discussed all results (EKG, imaging, lab, urine as appropriate), exam findings with patient. I have reviewed nursing notes and appropriate previous records.  I feel the patient is safe to be discharged home without further emergent workup. Discussed usual and customary return precautions. Patient and family (if present) verbalize understanding and are comfortable with this plan.  Patient will follow-up with their primary care provider. If they do not have a primary care provider, information for follow-up has been provided to them. All questions have been answered.  ____________________________________________  FINAL CLINICAL  IMPRESSION(S) / ED DIAGNOSES  Final diagnoses:  Bad headache  Essential hypertension  Left eye pain     MEDICATIONS GIVEN DURING THIS VISIT:  Medications  tetracaine (PONTOCAINE) 0.5 % ophthalmic solution 2 drop (2 drops Both Eyes Given by Other 02/22/17 1707)  acetaminophen (TYLENOL) tablet 1,000 mg (1,000 mg Oral Given 02/22/17 1708)    Note:  This document was prepared using Dragon voice recognition software and may include unintentional dictation errors.  Nanda Quinton, MD Emergency Medicine    Long, [facility], MD 02/22/17 [phone]

## 2017-02-22 NOTE — ED Triage Notes (Signed)
Patient states she had a headache behind the left eye and above the left eye today. Patient had her BP checked at work today and BP 198/95 and HR-101.

## 2017-02-22 NOTE — Telephone Encounter (Signed)
Patient Name: [name]  DOB: 27-Feb-1966    Initial Comment Caller states Bp is 148/95. Left eye pain and around her mouth feels funny.    Nurse Assessment  Nurse: Rene Kocher, RN, [place] Date/Time (Eastern Time): 02/22/2017 3:26:17 PM  Confirm and document reason for call. If symptomatic, describe symptoms. ---caller states her BP is 148/95 and she has a headache and eye pain that started at 1130 today. her mouth feels tight around her left cheek. pt hasn't been on high BP medications since her gastric sleeve surgery in May. pain was 8/10. since taking ibuprofen pain is 7/10. pt drank water and took ibuprofen at 1300 and again a few minutes ago.  Does the patient have any new or worsening symptoms? ---Yes  Will a triage be completed? ---Yes  Related visit to physician within the last 2 weeks? ---No  Does the PT have any chronic conditions? (i.e. diabetes, asthma, etc.) ---No  Is the patient pregnant or possibly pregnant? (Ask all females between the ages of 42-55) ---No  Is this a behavioral health or substance abuse call? ---No     Guidelines    Guideline Title Affirmed Question Affirmed Notes  Headache [1] SEVERE headache AND [2] sudden-onset (i.e., reaching maximum intensity within seconds)    Final Disposition User   Go to ED Now (or PCP triage) Rene Kocher, RN, [place]    Comments  called [phone] and there were no available appointments. pt advised to go to ER NOW  pt going to ER NOW   Referrals  [facility] - ED   Caller Disagree/Comply Comply  Caller Understands Yes  PreDisposition Call Doctor

## 2017-02-22 NOTE — Discharge Instructions (Signed)

## 2017-02-23 ENCOUNTER — Encounter: Payer: Self-pay | Admitting: Family Medicine

## 2017-02-23 ENCOUNTER — Ambulatory Visit (INDEPENDENT_AMBULATORY_CARE_PROVIDER_SITE_OTHER): Payer: 59 | Admitting: Family Medicine

## 2017-02-23 VITALS — BP 168/92 | HR 78 | Temp 98.1°F | Ht 61.0 in | Wt 222.0 lb

## 2017-02-23 DIAGNOSIS — I10 Essential (primary) hypertension: Secondary | ICD-10-CM | POA: Diagnosis not present

## 2017-02-23 MED ORDER — NEBIVOLOL HCL 5 MG PO TABS: 5.0000 mg | ORAL_TABLET | Freq: Every day | ORAL | 0 refills | Status: DC

## 2017-02-23 NOTE — Progress Notes (Signed)
[name] - 51 y.o. female MRN [id number]  Date of birth: [birth year]  SUBJECTIVE:  Including CC & ROS.  Chief Complaint  Patient presents with  . Hypertension    She went to [facility] 02/22/17-for elevated BP 198/98 and headache-still ongoing.     [name] is a 51 y.o. female that is presenting with acutely changed blood pressure. She will present to the emergency department on 11/12 because of a headache and some changes sensation on the left side of her face. She has a history of gastric sleeve surgery and has lost some weight. Since that time she was able to come off her blood pressure medications. She has historically been on Bystolic. She had an intolerance to losartan. She would like to start this medication back up again. She denies any chest pain or shortness of breath.   Review of Systems  Constitutional: Negative for fever.  Respiratory: Negative for cough and shortness of breath.   Cardiovascular: Negative for chest pain.  Neurological: Negative for weakness.    HISTORY: Past Medical, Surgical, Social, and Family History Reviewed & Updated per EMR.   Pertinent Historical Findings include:  Past Medical History:  Diagnosis Date  . Diabetes mellitus without complication ([place])   . Edema 04/25/2015  . Headache(784.0)   . Herniated disc   . History of colonic polyps   . Hypertension   . Obesity   . Osteoarthritis   . Shortness of breath 04/25/2015  . TIA (transient ischemic attack) 2007   hx of  . Vasculitis ([facility])    L leg - Polyarthritis  . Venous insufficiency     Past Surgical History:  Procedure Laterality Date  . ABDOMINAL HYSTERECTOMY      No Known Allergies  Family History  Problem Relation Age of Onset  . Stroke Mother   . Hypertension Mother   . AAA (abdominal aortic aneurysm) Mother   . Hypertension Father   . Cancer Father        Prostate  . Arthritis Other   . Hypertension Other   . Stroke Other      Social History    Socioeconomic History  . Marital status: Single    Spouse name: Not on file  . Number of children: Not on file  . Years of education: Not on file  . Highest education level: Not on file  Social Needs  . Financial resource strain: Not on file  . Food insecurity - worry: Not on file  . Food insecurity - inability: Not on file  . Transportation needs - medical: Not on file  . Transportation needs - non-medical: Not on file  Occupational History  . Not on file  Tobacco Use  . Smoking status: Never Smoker  . Smokeless tobacco: Never Used  Substance and Sexual Activity  . Alcohol use: No    Alcohol/week: 0.0 oz  . Drug use: No  . Sexual activity: Yes    Birth control/protection: Surgical  Other Topics Concern  . Not on file  Social History Narrative  . Not on file     PHYSICAL EXAM:  VS: BP (!) 168/92 (BP Location: Left Arm, Patient Position: Sitting, Cuff Size: Normal)   Pulse 78   Temp 98.1 F (36.7 C) (Oral)   Ht 5\' 1"  (1.549 m)   Wt 222 lb (100.7 kg)   SpO2 99%   BMI 41.95 kg/m  Physical Exam Gen: NAD, alert, cooperative with exam, well-appearing ENT: normal lips, normal nasal  mucosa,  Eye: normal EOM, normal conjunctiva and lids CV:  no edema, +2 pedal pulses, S1-S2, regular rate and rhythm   Resp: no accessory muscle use, non-labored, clear to auscultation bilaterally, no crackles or wheezes Skin: no rashes, no areas of induration  Neuro: normal tone, normal sensation to touch Psych:  normal insight, alert and oriented MSK: Normal gait, normal strength      ASSESSMENT & PLAN:   Essential hypertension, benign Recently uncontrolled. - Restarted bystolic - Has follow-up with her primary doctor in a couple of weeks.

## 2017-02-23 NOTE — Patient Instructions (Addendum)
Thank you for coming in,   I have sent a refill of your bystolic.   Please feel free to call with any questions or concerns at any time, at [phone]. --Dr. Raeford Razor

## 2017-02-24 NOTE — Assessment & Plan Note (Signed)
Recently uncontrolled. - Restarted bystolic - Has follow-up with her primary doctor in a couple of weeks.

## 2017-02-26 ENCOUNTER — Encounter: Payer: Self-pay | Admitting: Internal Medicine

## 2017-02-26 ENCOUNTER — Ambulatory Visit (INDEPENDENT_AMBULATORY_CARE_PROVIDER_SITE_OTHER): Payer: 59 | Admitting: Internal Medicine

## 2017-02-26 VITALS — BP 130/78 | HR 69 | Temp 98.0°F | Ht 61.0 in | Wt 210.6 lb

## 2017-02-26 DIAGNOSIS — E1165 Type 2 diabetes mellitus with hyperglycemia: Secondary | ICD-10-CM

## 2017-02-26 LAB — POCT GLYCOSYLATED HEMOGLOBIN (HGB A1C): HEMOGLOBIN A1C: 5.5

## 2017-02-26 NOTE — Patient Instructions (Signed)
Please continue off diabetes medications.  Check sugars every other days >> let me know if they start to increase.  Please come back in 1 year and for a HbA1c in 6 months.

## 2017-02-26 NOTE — Progress Notes (Signed)
Pre visit review using our clinic review tool, if applicable. No additional management support is needed unless otherwise documented below in the visit note. 

## 2017-02-26 NOTE — Progress Notes (Signed)
Patient ID: [name], female   DOB: [birth year], 51 y.o.   MRN: [id number]   HPI: [name] is a 51 y.o.-year-old female, returning for f/u for DM2, dx in 09/2014, prediabetes since at least 2008,  non-insulin-dependent, uncontrolled, without long term complications. Last visit 6 mo ago.  Since last visit, she went to the ED with HA 2/2 HTN 02/22/2017. She restarted Bystolic.  She had gastric sleeve surgery in 08/11/2016. She lost 95 lbs (from 303 lbs. Her diabetes control has improved >> came off DM meds. Latest HbA1c in the normal range.   She continues to see nutrition. No fruit/bread/cake b/c vomiting.  Last hemoglobin A1c was: Lab Results  Component Value Date   HGBA1C 5.4 10/27/2016   HGBA1C 7.2 (H) 08/07/2016   HGBA1C 7.1 (H) 03/24/2016   Pt was on: - Synjardy XR (metformin ER 1000 mg-Jardiance 5 mg) x 2 tablets daily - started in 11/2015 >> diarrhea - Metformin ER 1000 mg 2x a day >> still some diarrhea with 2 tabs - stopped at the time of the Sx  She continues off DM meds.  Pt checks sugars 1x a day: - am: 148 >> 70 x1, 105-141, 151 >> 80-90s >> n/c - 2h after b'fast: 138, 173, 237, 239 >> n/c - before lunch: n/c >> 96-178 >> n/c - 2h after lunch: n/c >> 92-171, 182 >> n/c - before dinner: 81 >> 140, 151 >> 80s >> n/c - 2h after dinner: n/c  - bedtime: n/c - nighttime: n/c Lowest sugar was 82 >> ?; ? hypoglycemia awareness Highest sugar was 173 >> 100 >> ?Marland Kitchen  Glucometer: One Touch Verio >> AccuChek  - No CKD, last BUN/creatinine:  Lab Results  Component Value Date   BUN 15 02/22/2017   BUN 16 08/12/2016   CREATININE 0.99 02/22/2017   CREATININE 0.95 08/12/2016  Not on ACEI/ARB. - last set of lipids: Lab Results  Component Value Date   CHOL 161 12/18/2015   HDL 43.20 12/18/2015   LDLCALC 98 12/18/2015   TRIG 99.0 12/18/2015   CHOLHDL 4 12/18/2015  Not on a statin - last eye exam was in 09/2016 >> No DR. - no numbness and tingling in her  feet.  ROS: Constitutional: + weight loss, no fatigue, no subjective hyperthermia, no subjective hypothermia Eyes: no blurry vision, no xerophthalmia ENT: no sore throat, no nodules palpated in throat, no dysphagia, no odynophagia, no hoarseness Cardiovascular: no CP/no SOB/no palpitations/no leg swelling Respiratory: no cough/no SOB/no wheezing Gastrointestinal: no N/no V/no D/no C/no acid reflux Musculoskeletal: no muscle aches/no joint aches Skin: no rashes, no hair loss Neurological: no tremors/no numbness/no tingling/no dizziness  I reviewed pt's medications, allergies, PMH, social hx, family hx, and changes were documented in the history of present illness. Otherwise, unchanged from my initial visit note.   Past Medical History:  Diagnosis Date  . Diabetes mellitus without complication ([place])   . Edema 04/25/2015  . Headache(784.0)   . Herniated disc   . History of colonic polyps   . Hypertension   . Obesity   . Osteoarthritis   . Shortness of breath 04/25/2015  . TIA (transient ischemic attack) 2007   hx of  . Vasculitis ([facility])    L leg - Polyarthritis  . Venous insufficiency    Past Surgical History:  Procedure Laterality Date  . ABDOMINAL HYSTERECTOMY    . LAPAROSCOPIC GASTRIC SLEEVE RESECTION, UPPER ENDOSCOPY N/A 08/11/2016   Performed by Kinsinger, Arta Bruce, MD at [facility]  ORS   Social History   Social History  . Marital status: Single    Spouse name: N/A  . Number of children: 3   Occupational History  . Clinic scheduler for cardiology    Social History Main Topics  . Smoking status: Never Smoker  . Smokeless tobacco: Never Used  . Alcohol use 0.0 oz/week     Comment: occasionally  . Drug use: No  . Sexual activity: Yes    Birth control/ protection: Surgical   Current Outpatient Medications on File Prior to Visit  Medication Sig Dispense Refill  . CALCIUM CITRATE CHEWY BITE 500-500 MG-UNIT chewable tablet Chew 3 tablets daily by mouth.  3  .  cyclobenzaprine (FLEXERIL) 5 MG tablet Take 1 tablet (5 mg total) by mouth 3 (three) times daily as needed for muscle spasms. 30 tablet 1  . meloxicam (MOBIC) 15 MG tablet Take 1 tablet (15 mg total) by mouth daily. 30 tablet 0  . Multiple Vitamin (MULTIVITAMIN) tablet Take 1 tablet by mouth daily.    . nebivolol (BYSTOLIC) 5 MG tablet Take 1 tablet (5 mg total) daily by mouth. 90 tablet 0   No current facility-administered medications on file prior to visit.    No Known Allergies Family History  Problem Relation Age of Onset  . Stroke Mother   . Hypertension Mother   . AAA (abdominal aortic aneurysm) Mother   . Hypertension Father   . Cancer Father        Prostate  . Arthritis Other   . Hypertension Other   . Stroke Other    PE: BP 130/78   Pulse 69   Temp 98 F (36.7 C) (Oral)   Ht 5\' 1"  (1.549 m)   Wt 210 lb 9.6 oz (95.5 kg)   SpO2 98%   BMI 39.79 kg/m  Wt Readings from Last 3 Encounters:  02/26/17 210 lb 9.6 oz (95.5 kg)  02/23/17 222 lb (100.7 kg)  02/22/17 204 lb (92.5 kg)   Constitutional: overweight, in NAD Eyes: PERRLA, EOMI, no exophthalmos ENT: moist mucous membranes, no thyromegaly, no cervical lymphadenopathy Cardiovascular: RRR, No MRG Respiratory: CTA B Gastrointestinal: abdomen soft, NT, ND, BS+ Musculoskeletal: no deformities, strength intact in all 4 Skin: moist, warm, + stasis dermatitis R>L Neurological: no tremor with outstretched hands, DTR normal in all 4  ASSESSMENT: 1. DM2, non-insulin-dependent, uncontrolled, without Long term complications, but with hyperglycemia  2. Obesity  PLAN:  1. Patient with recent dramatic improvement of her DM2 after GBP >> lost 95 lbs (she would like to lose 50 lbs more). She is off all her DM meds and today, HbA1c is 5.5%. She is not checking her sugars at home >> discussed to do so every other day. - no changes necessary for now - I suggested to:  Patient Instructions  Please continue off diabetes  medications.  Check sugars every other days >> let me know if they start to increase.  Please come back in 1 year and for a HbA1c in 6 months.  - continue checking sugars at different times of the day - check 1x every other a day, rotating checks - advised for yearly eye exams >> she is not UTD - had flu shot this season - Return to clinic in 6 mo for a HbA1c and in 1 year for a visit    2. Obesity - doing well post GBP sx (gastric sleeve) 6 mo ago >> lost (per her scale 95 lbs!) - she feels well,  no complaints other than vomiting with carbohydrate-rich foods >> stay away from them for now  Philemon Kingdom, MD PhD [facility] End[facility]

## 2017-03-10 ENCOUNTER — Ambulatory Visit (INDEPENDENT_AMBULATORY_CARE_PROVIDER_SITE_OTHER): Payer: 59 | Admitting: Internal Medicine

## 2017-03-10 ENCOUNTER — Encounter: Payer: Self-pay | Admitting: Internal Medicine

## 2017-03-10 VITALS — BP 132/82 | HR 61 | Temp 98.1°F | Resp 16 | Ht 61.0 in | Wt 211.0 lb

## 2017-03-10 DIAGNOSIS — H6121 Impacted cerumen, right ear: Secondary | ICD-10-CM | POA: Diagnosis not present

## 2017-03-10 DIAGNOSIS — I10 Essential (primary) hypertension: Secondary | ICD-10-CM | POA: Diagnosis not present

## 2017-03-10 NOTE — Progress Notes (Signed)
Subjective:  Patient ID: [name], female    DOB: 1965/12/12  Age: 51 y.o. MRN: [id number]  CC: Hypertension   HPI CANA MIGNANO presents for a BP check - she complains of a 1 month hx of decreased LOH and muffled sensation in his right ear. Her BP has been well controlled.  Outpatient Medications Prior to Visit  Medication Sig Dispense Refill  . CALCIUM CITRATE CHEWY BITE 500-500 MG-UNIT chewable tablet Chew 3 tablets daily by mouth.  3  . Multiple Vitamin (MULTIVITAMIN) tablet Take 1 tablet by mouth daily.    . nebivolol (BYSTOLIC) 5 MG tablet Take 1 tablet (5 mg total) daily by mouth. 90 tablet 0  . cyclobenzaprine (FLEXERIL) 5 MG tablet Take 1 tablet (5 mg total) by mouth 3 (three) times daily as needed for muscle spasms. (Patient not taking: Reported on 03/10/2017) 30 tablet 1  . meloxicam (MOBIC) 15 MG tablet Take 1 tablet (15 mg total) by mouth daily. (Patient not taking: Reported on 03/10/2017) 30 tablet 0   No facility-administered medications prior to visit.     ROS Review of Systems  Constitutional: Negative.  Negative for appetite change and fatigue.  HENT: Positive for ear pain and hearing loss. Negative for congestion, ear discharge, facial swelling, postnasal drip, rhinorrhea, sinus pressure, sinus pain, sneezing, sore throat, tinnitus and voice change.   Eyes: Negative.  Negative for visual disturbance.  Respiratory: Negative.  Negative for cough.   Cardiovascular: Negative.  Negative for chest pain, palpitations and leg swelling.  Gastrointestinal: Negative.  Negative for abdominal pain, nausea and vomiting.  Endocrine: Negative.   Genitourinary: Negative.  Negative for difficulty urinating.  Musculoskeletal: Negative.  Negative for back pain and neck pain.  Skin: Negative.  Negative for rash.  Allergic/Immunologic: Negative.   Neurological: Negative.  Negative for dizziness, weakness, light-headedness and headaches.  Hematological: Negative for  adenopathy. Does not bruise/bleed easily.  Psychiatric/Behavioral: Negative.     Objective:  BP 132/82 (BP Location: Left Arm, Patient Position: Sitting, Cuff Size: Normal)   Pulse 61   Temp 98.1 F (36.7 C) (Oral)   Resp 16   Ht 5' 1"  (1.549 m)   Wt 211 lb (95.7 kg)   SpO2 98%   BMI 39.87 kg/m   BP Readings from Last 3 Encounters:  03/10/17 132/82  02/26/17 130/78  02/23/17 (!) 168/92    Wt Readings from Last 3 Encounters:  03/10/17 211 lb (95.7 kg)  02/26/17 210 lb 9.6 oz (95.5 kg)  02/23/17 222 lb (100.7 kg)    Physical Exam  Constitutional: She is oriented to person, place, and time. No distress.  HENT:  Right Ear: Hearing, tympanic membrane, external ear and ear canal normal.  Left Ear: Hearing, tympanic membrane, external ear and ear canal normal.  Mouth/Throat: Oropharynx is clear and moist. No oropharyngeal exudate.  There is a cerumen impaction in the right EAC.  I put Colace in the right EAC and then I irrigated with water and used an ear pick to remove the cerumen.  Her symptoms resolved after this.  The examination afterwards shows the EAC and TM are normal.  Eyes: Conjunctivae are normal. Left eye exhibits no discharge. No scleral icterus.  Neck: Normal range of motion. Neck supple. No JVD present. No thyromegaly present.  Cardiovascular: Normal rate, regular rhythm and normal heart sounds.  No murmur heard. Pulmonary/Chest: Effort normal and breath sounds normal. She has no wheezes. She has no rales.  Abdominal: Soft. Bowel sounds  are normal. She exhibits no distension. There is no rebound.  Musculoskeletal: Normal range of motion. She exhibits no edema, tenderness or deformity.  Neurological: She is alert and oriented to person, place, and time.  Skin: Skin is warm and dry. No rash noted. She is not diaphoretic. No erythema. No pallor.  Vitals reviewed.   Lab Results  Component Value Date   WBC 7.6 02/22/2017   HGB 12.0 02/22/2017   HCT 35.9 (L)  02/22/2017   PLT 290 02/22/2017   GLUCOSE 89 02/22/2017   CHOL 161 12/18/2015   TRIG 99.0 12/18/2015   HDL 43.20 12/18/2015   LDLCALC 98 12/18/2015   ALT 25 08/12/2016   AST 21 08/12/2016   NA 136 02/22/2017   K 3.8 02/22/2017   CL 104 02/22/2017   CREATININE 0.99 02/22/2017   BUN 15 02/22/2017   CO2 24 02/22/2017   TSH 2.53 12/18/2015   INR 1.0 05/25/2007   HGBA1C 5.5 02/26/2017   MICROALBUR 1.5 12/05/2015    No results found.  Assessment & Plan:   Shanara was seen today for hypertension.  Diagnoses and all orders for this visit:  Essential hypertension, benign- Her BP is well controlled  Hearing loss due to cerumen impaction, right- sxs resolved after cerumen removed   I am having Starlynn D. Quentin Cornwall maintain her multivitamin, meloxicam, cyclobenzaprine, CALCIUM CITRATE CHEWY BITE, and nebivolol.  No orders of the defined types were placed in this encounter.    Follow-up: Return in about 3 months (around 06/10/2017).  Scarlette Calico, MD

## 2017-03-10 NOTE — Patient Instructions (Signed)

## 2017-03-11 ENCOUNTER — Telehealth: Payer: Self-pay | Admitting: Internal Medicine

## 2017-03-11 NOTE — Telephone Encounter (Signed)
Called and spoke with [facility] office to request eye exams for the patient. They will be faxing over the OV notes from a couple months back and one from last year.

## 2017-03-13 ENCOUNTER — Encounter: Payer: Self-pay | Admitting: Internal Medicine

## 2017-03-13 DIAGNOSIS — H6121 Impacted cerumen, right ear: Secondary | ICD-10-CM | POA: Insufficient documentation

## 2017-03-25 ENCOUNTER — Ambulatory Visit: Payer: Self-pay | Admitting: Registered"

## 2017-04-09 DIAGNOSIS — Z6839 Body mass index (BMI) 39.0-39.9, adult: Secondary | ICD-10-CM | POA: Diagnosis not present

## 2017-04-09 DIAGNOSIS — Z01419 Encounter for gynecological examination (general) (routine) without abnormal findings: Secondary | ICD-10-CM | POA: Diagnosis not present

## 2017-04-09 DIAGNOSIS — Z1231 Encounter for screening mammogram for malignant neoplasm of breast: Secondary | ICD-10-CM | POA: Diagnosis not present

## 2017-04-09 LAB — HM MAMMOGRAPHY: HM Mammogram: NORMAL (ref 0–4)

## 2017-04-20 ENCOUNTER — Encounter: Payer: 59 | Attending: General Surgery | Admitting: Registered"

## 2017-04-20 DIAGNOSIS — Z713 Dietary counseling and surveillance: Secondary | ICD-10-CM | POA: Diagnosis not present

## 2017-04-20 DIAGNOSIS — E119 Type 2 diabetes mellitus without complications: Secondary | ICD-10-CM | POA: Diagnosis not present

## 2017-04-20 NOTE — Patient Instructions (Addendum)
-   Glance at clock while eating to track 30 minutes of waiting to drink after eating.   - Try not to skip meals.   - 9 grams or less of sugar/fat and 15 grams or less of carbohydrate.   - Aim to get back on track with 60 grams of protein a day.   - Increase fluid intake to at least 64 ounces of fluid.

## 2017-04-20 NOTE — Progress Notes (Signed)
8 Months Post-Op Sleeve Gastrectomy Surgery  Medical Nutrition Therapy:  Appt start time: 4:50 end time:  5:35  Primary concerns today: Post-operative Bariatric Surgery Nutrition Management.  Non scale victories: able to go up stairs better, A1c 5.5  Surgery date: 08/11/2016 Surgery type: sleeve gastrectomy [facility] weight at [facility]: 292.3 lbs Weight today: 208.6 lbs Weight change: 14 lbs loss from 222.6 lbs (12/24/2016) Total weight loss: 83.7 Weight loss goal: to be healthy  TANITA  BODY COMP RESULTS  07/27/2016 10/06/2016 10/21/2016 12/24/2016 04/20/2017   BMI (kg/m^2) 55.9 46.2 N/A 42.1 39.4   Fat Mass (lbs) 165.4 123.4  98.2 85.6   Fat Free Mass (lbs) 130.6 121.0  124.4 123.0  Total Body Water (lbs) N/A 88.6  90.2 88.6    Pt states she is taking Bariactiv multivitamin capsule once a day; prescription-based. Pt states she has not ben checking BS and endocrinologist wants her checking them once every a few weeks. Pt states she wants to lose in abdomen area. Pt states she got off track during Christmas holiday. Pt wants to add in fruit. Pt states she is having major hot flashes.   Pt states the BELT program is too early. Pt states she gets a salt craving every now and then.   Preferred Learning Style:   No preference indicated   Learning Readiness:   Ready  Change in progress  24-hr recall: B (AM): sometimes skips; scoop of egg and [place] bacon (21g)  Snk (AM): peanut butter crackers (4g) or greek yogurt (15g)-sometimes  L (PM): chicken strips (6g) or tomato basil soup or chicken salad (14g) Snk (PM): doritoes  D (PM): salad with ham (14g)  Snk (PM): none  Fluid intake: water (sometimes with flavors), rarely unsweet tea, coffee: 3-4 20 ounce bottles Estimated total protein intake: about 73g protein daily   Medications: See list Supplementation: 1 Bariativ capsule + 3 calcium + biotin  Using straws: no Drinking while eating: no Having you been chewing well:  yes Chewing/swallowing difficulties: no Changes in vision: no Changes to mood/headaches: no Hair loss/Changes to skin/Changes to nails: no Any difficulty focusing or concentrating: no Sweating: no Dizziness/Lightheaded:  no Palpitations: no  Carbonated beverages: no N/V/D/C/GAS: maybe 1 bowel movement a week  Abdominal Pain: no Dumping syndrome: no  Recent physical activity:  Walking 50mi about 3x/wk  Progress Towards Goal(s):  In progress.  Handouts given during visit include:  Snack ideas for bariatric patients   Nutritional Diagnosis:  [place]-3.3 Overweight/obesity related to past poor dietary habits and physical inactivity as evidenced by patient w/ recent sleeve gastrectomy surgery following dietary guidelines for continued weight loss.     Intervention:  Nutrition education and counseling.   Goals: - Glance at clock while eating to track 30 minutes of waiting to drink after eating.  - Try not to skip meals.  - 9 grams or less of sugar/fat and 15 grams or less of carbohydrate.  - Aim to get back on track with 60 grams of protein a day.  - Increase fluid intake to at least 64 ounces of fluid.    Teaching Method Utilized:  Visual Auditory Hands on  Barriers to learning/adherence to lifestyle change: none identified  Demonstrated degree of understanding via:  Teach Back   Monitoring/Evaluation:  Dietary intake, exercise, and body weight.

## 2017-05-03 ENCOUNTER — Encounter: Payer: Self-pay | Admitting: Internal Medicine

## 2017-05-05 ENCOUNTER — Ambulatory Visit: Payer: Self-pay | Admitting: Internal Medicine

## 2017-05-10 ENCOUNTER — Other Ambulatory Visit (INDEPENDENT_AMBULATORY_CARE_PROVIDER_SITE_OTHER): Payer: 59

## 2017-05-10 ENCOUNTER — Ambulatory Visit (INDEPENDENT_AMBULATORY_CARE_PROVIDER_SITE_OTHER): Payer: 59 | Admitting: Internal Medicine

## 2017-05-10 ENCOUNTER — Ambulatory Visit (INDEPENDENT_AMBULATORY_CARE_PROVIDER_SITE_OTHER)
Admission: RE | Admit: 2017-05-10 | Discharge: 2017-05-10 | Disposition: A | Discharge status: Home / Self Care | Payer: 59 | Source: Ambulatory Visit | Attending: Internal Medicine | Admitting: Internal Medicine

## 2017-05-10 ENCOUNTER — Encounter: Payer: Self-pay | Admitting: Internal Medicine

## 2017-05-10 VITALS — BP 130/80 | HR 71 | Temp 98.0°F | Resp 16 | Ht 61.0 in | Wt 210.8 lb

## 2017-05-10 DIAGNOSIS — M79604 Pain in right leg: Secondary | ICD-10-CM

## 2017-05-10 DIAGNOSIS — M79671 Pain in right foot: Secondary | ICD-10-CM

## 2017-05-10 DIAGNOSIS — M7989 Other specified soft tissue disorders: Secondary | ICD-10-CM | POA: Diagnosis not present

## 2017-05-10 DIAGNOSIS — E1165 Type 2 diabetes mellitus with hyperglycemia: Secondary | ICD-10-CM

## 2017-05-10 DIAGNOSIS — I10 Essential (primary) hypertension: Secondary | ICD-10-CM

## 2017-05-10 DIAGNOSIS — I872 Venous insufficiency (chronic) (peripheral): Secondary | ICD-10-CM

## 2017-05-10 DIAGNOSIS — M79661 Pain in right lower leg: Secondary | ICD-10-CM | POA: Diagnosis not present

## 2017-05-10 DIAGNOSIS — R7989 Other specified abnormal findings of blood chemistry: Secondary | ICD-10-CM

## 2017-05-10 LAB — BASIC METABOLIC PANEL
BUN: 14 mg/dL (ref 6–23)
CHLORIDE: 105 meq/L (ref 96–112)
CO2: 28 meq/L (ref 19–32)
CREATININE: 0.95 mg/dL (ref 0.40–1.20)
Calcium: 9.2 mg/dL (ref 8.4–10.5)
GFR: 80 mL/min (ref >60.00)
Glucose, Bld: 101 mg/dL — ABNORMAL HIGH (ref 70–99)
POTASSIUM: 4.1 meq/L (ref 3.5–5.1)
Sodium: 140 mEq/L (ref 135–145)

## 2017-05-10 LAB — MICROALBUMIN / CREATININE URINE RATIO
Creatinine,U: 137.2 mg/dL
Microalb Creat Ratio: 0.5 mg/g (ref 0.0–30.0)
Microalb, Ur: <0.7 mg/dL (ref 0.0–1.9)

## 2017-05-10 LAB — HEMOGLOBIN A1C: Hgb A1c MFr Bld: 5.9 % (ref 4.6–6.5)

## 2017-05-10 NOTE — Patient Instructions (Signed)

## 2017-05-10 NOTE — Progress Notes (Signed)
Subjective:  Patient ID: [name], female    DOB: [birth year]  Age: 52 y.o. MRN: [id number]  CC: Hypertension   HPI [name] presents for f/up - she complains of a 3-week history of vague pain in her right lower extremity over the tib-fib area and into the foot.  She denies trauma or injury.  She is able to bear weight on the right lower extremity without difficulty.  She feels like the right calf is bigger than the left calf.  She denies swelling in her lower extremities.  The pain increases at night.  She denies any symptoms suspicious for claudication.  She has a history of venous insufficiency diagnosed by a vascular surgeon but it is not being treated.  Outpatient Medications Prior to Visit  Medication Sig Dispense Refill  . CALCIUM CITRATE CHEWY BITE 500-500 MG-UNIT chewable tablet Chew 3 tablets daily by mouth.  3  . Multiple Vitamin (MULTIVITAMIN) tablet Take 1 tablet by mouth daily.    . nebivolol (BYSTOLIC) 5 MG tablet Take 1 tablet (5 mg total) daily by mouth. 90 tablet 0  . cyclobenzaprine (FLEXERIL) 5 MG tablet Take 1 tablet (5 mg total) by mouth 3 (three) times daily as needed for muscle spasms. (Patient not taking: Reported on 03/10/2017) 30 tablet 1  . meloxicam (MOBIC) 15 MG tablet Take 1 tablet (15 mg total) by mouth daily. (Patient not taking: Reported on 03/10/2017) 30 tablet 0   No facility-administered medications prior to visit.     ROS Review of Systems  Constitutional: Negative.  Negative for appetite change, diaphoresis, fatigue and unexpected weight change.  HENT: Negative.  Negative for trouble swallowing.   Eyes: Negative.  Negative for visual disturbance.  Respiratory: Negative for cough, chest tightness, shortness of breath and wheezing.   Cardiovascular: Negative for chest pain, palpitations and leg swelling.  Gastrointestinal: Negative for abdominal pain, constipation, diarrhea, nausea and vomiting.  Endocrine: Negative.     Genitourinary: Negative.  Negative for difficulty urinating and dysuria.  Musculoskeletal: Positive for arthralgias and myalgias. Negative for back pain, joint swelling and neck stiffness.  Skin: Negative.  Negative for color change and rash.  Allergic/Immunologic: Negative.   Neurological: Negative.  Negative for dizziness, weakness, light-headedness, numbness and headaches.  Hematological: Negative for adenopathy. Does not bruise/bleed easily.  Psychiatric/Behavioral: Negative.     Objective:  BP 130/80 (BP Location: Left Arm, Patient Position: Sitting, Cuff Size: Large)   Pulse 71   Temp 98 F (36.7 C) (Oral)   Resp 16   Ht 5\' 1"  (1.549 m)   Wt 210 lb 12.8 oz (95.6 kg)   SpO2 98%   BMI 39.83 kg/m   BP Readings from Last 3 Encounters:  05/10/17 130/80  03/10/17 132/82  02/26/17 130/78    Wt Readings from Last 3 Encounters:  05/10/17 210 lb 12.8 oz (95.6 kg)  03/10/17 211 lb (95.7 kg)  02/26/17 210 lb 9.6 oz (95.5 kg)    Physical Exam  Constitutional: She is oriented to person, place, and time. No distress.  HENT:  Mouth/Throat: Oropharynx is clear and moist. No oropharyngeal exudate.  Eyes: Conjunctivae are normal. Left eye exhibits no discharge. No scleral icterus.  Neck: Normal range of motion. Neck supple. No JVD present. No thyromegaly present.  Cardiovascular: Normal rate, regular rhythm and normal heart sounds. Exam reveals no gallop.  No murmur heard. Pulses:      Carotid pulses are 1+ on the right side, and 1+ on the left  side.      Radial pulses are 1+ on the right side, and 1+ on the left side.       Femoral pulses are 1+ on the right side, and 1+ on the left side.      Popliteal pulses are 1+ on the right side, and 1+ on the left side.       Dorsalis pedis pulses are 1+ on the right side, and 1+ on the left side.       Posterior tibial pulses are 1+ on the right side, and 1+ on the left side.  Pulmonary/Chest: Effort normal and breath sounds normal. No  respiratory distress. She has no wheezes. She has no rales.  Abdominal: Soft. Bowel sounds are normal. She exhibits no distension and no mass.  Musculoskeletal: Normal range of motion. She exhibits no edema, tenderness or deformity.       Right knee: She exhibits normal range of motion, no swelling, no ecchymosis, no deformity and no erythema. No tenderness found.       Right lower leg: She exhibits no tenderness, no bony tenderness, no swelling, no edema and no laceration.       Left lower leg: Normal. She exhibits no tenderness, no bony tenderness, no swelling, no edema, no deformity and no laceration.       Legs:      Right foot: There is bony tenderness. There is normal range of motion, no swelling, normal capillary refill, no crepitus, no deformity and no laceration.  There is diffuse bony tenderness on the dorsum of the right foot. There is no edema. Sensation and capillary refill is intact. There are no epidermal lesions.  Lymphadenopathy:    She has no cervical adenopathy.  Neurological: She is alert and oriented to person, place, and time.  Skin: Skin is warm and dry. No rash noted. She is not diaphoretic. No erythema. No pallor.  Vitals reviewed.   Lab Results  Component Value Date   WBC 7.6 02/22/2017   HGB 12.0 02/22/2017   HCT 35.9 (L) 02/22/2017   PLT 290 02/22/2017   GLUCOSE 101 (H) 05/10/2017   CHOL 161 12/18/2015   TRIG 99.0 12/18/2015   HDL 43.20 12/18/2015   LDLCALC 98 12/18/2015   ALT 25 08/12/2016   AST 21 08/12/2016   NA 140 05/10/2017   K 4.1 05/10/2017   CL 105 05/10/2017   CREATININE 0.95 05/10/2017   BUN 14 05/10/2017   CO2 28 05/10/2017   TSH 2.53 12/18/2015   INR 1.0 05/25/2007   HGBA1C 5.9 05/10/2017   MICROALBUR <0.7 05/10/2017    No results found.  Assessment & Plan:   Saman was seen today for hypertension.  Diagnoses and all orders for this visit:  Essential hypertension, benign- her blood pressure is well controlled.  Electrolytes and  renal function are normal. -     Basic metabolic panel; Future  Type 2 diabetes mellitus with hyperglycemia, without long-term current use of insulin ([place])- Her blood sugars are well controlled. -     Basic metabolic panel; Future -     Microalbumin / creatinine urine ratio; Future -     Hemoglobin A1c; Future  Chronic venous insufficiency-Will start treating this with vasculera to reduce the symptoms and to prevent complications like venous stasis and ulceration. -     Dietary Management Product (VASCULERA) TABS; Take 1 capsule by mouth daily.  Leg pain, anterior, right- Exam of the extremity shows that she is neurovascularly intact.  Plain films are normal.  Will treat for CVI. She has a slightly elevated d-dimer so will screen for DVT.  -     D-dimer, quantitative (not at [facility]); Future -     DG Tibia/Fibula Right; Future  Acute foot pain, right- as above -     DG Foot Complete Right; Future  D-dimer, elevated- as above -     VAS [place] LOWER EXTREMITY VENOUS (DVT); Future   I have discontinued L[facility] D. Robinson's meloxicam and cyclobenzaprine. I am also having her start on VASCULERA. Additionally, I am having her maintain her multivitamin, CALCIUM CITRATE CHEWY BITE, and nebivolol.  Meds ordered this encounter  Medications  . Dietary Management Product (VASCULERA) TABS    Sig: Take 1 capsule by mouth daily.    Dispense:  30 tablet    Refill:  11     Follow-up: Return in about 3 weeks (around 05/31/2017).  Scarlette Calico, MD

## 2017-05-11 ENCOUNTER — Encounter: Payer: Self-pay | Admitting: Internal Medicine

## 2017-05-11 ENCOUNTER — Inpatient Hospital Stay (HOSPITAL_COMMUNITY)
Admission: RE | Admit: 2017-05-11 | Discharge: 2017-05-11 | Disposition: A | Discharge status: Home / Self Care | Payer: Self-pay | Source: Ambulatory Visit

## 2017-05-11 ENCOUNTER — Telehealth: Payer: Self-pay | Admitting: Internal Medicine

## 2017-05-11 ENCOUNTER — Ambulatory Visit (HOSPITAL_COMMUNITY)
Admission: RE | Admit: 2017-05-11 | Discharge: 2017-05-11 | Disposition: A | Discharge status: Home / Self Care | Payer: 59 | Source: Ambulatory Visit | Attending: Internal Medicine | Admitting: Internal Medicine

## 2017-05-11 DIAGNOSIS — R7989 Other specified abnormal findings of blood chemistry: Secondary | ICD-10-CM | POA: Insufficient documentation

## 2017-05-11 LAB — D-DIMER, QUANTITATIVE: D-Dimer, Quant: 0.71 mcg/mL FEU — ABNORMAL HIGH (ref <0.50)

## 2017-05-11 MED ORDER — VASCULERA PO TABS: 1.0000 | ORAL_TABLET | Freq: Every day | ORAL | 11 refills | Status: DC

## 2017-05-11 NOTE — Telephone Encounter (Signed)
The xrays were normal No U/S report back yet

## 2017-05-11 NOTE — Telephone Encounter (Signed)
Pt informed of the lab results.

## 2017-05-11 NOTE — Telephone Encounter (Signed)
Copied from [place] [id number]. Topic: Quick Communication - See Telephone Encounter >> May 11, 2017  9:06 AM Bea Graff, NT wrote: CRM for notification. See Telephone encounter for: Pt would like a call to go over her lab results and x-ray results  05/11/17.

## 2017-05-11 NOTE — Telephone Encounter (Signed)
Pt is requesting the [place] and Xray results.

## 2017-05-12 ENCOUNTER — Encounter: Payer: Self-pay | Admitting: Internal Medicine

## 2017-05-12 NOTE — Telephone Encounter (Signed)
Pt informed via mychart

## 2017-05-28 ENCOUNTER — Encounter: Payer: Self-pay | Admitting: Internal Medicine

## 2017-05-31 ENCOUNTER — Other Ambulatory Visit: Payer: Self-pay | Admitting: Internal Medicine

## 2017-05-31 DIAGNOSIS — M79604 Pain in right leg: Secondary | ICD-10-CM

## 2017-06-08 ENCOUNTER — Other Ambulatory Visit: Payer: Self-pay | Admitting: Family Medicine

## 2017-06-09 ENCOUNTER — Encounter: Payer: Self-pay | Admitting: Obstetrics & Gynecology

## 2017-06-10 ENCOUNTER — Encounter: Payer: Self-pay | Admitting: Family Medicine

## 2017-06-10 ENCOUNTER — Ambulatory Visit (INDEPENDENT_AMBULATORY_CARE_PROVIDER_SITE_OTHER): Payer: 59 | Admitting: Internal Medicine

## 2017-06-10 ENCOUNTER — Ambulatory Visit (INDEPENDENT_AMBULATORY_CARE_PROVIDER_SITE_OTHER): Payer: 59 | Admitting: Family Medicine

## 2017-06-10 VITALS — BP 138/84 | HR 58 | Temp 98.2°F | Ht 61.0 in

## 2017-06-10 DIAGNOSIS — M84374A Stress fracture, right foot, initial encounter for fracture: Secondary | ICD-10-CM | POA: Diagnosis not present

## 2017-06-10 DIAGNOSIS — M8430XA Stress fracture, unspecified site, initial encounter for fracture: Secondary | ICD-10-CM | POA: Insufficient documentation

## 2017-06-10 DIAGNOSIS — I10 Essential (primary) hypertension: Secondary | ICD-10-CM

## 2017-06-10 NOTE — Patient Instructions (Signed)
Please see me back in 3-4 weeks. Please try to use Tylenol for the pain. Please use ice as well.

## 2017-06-10 NOTE — Assessment & Plan Note (Signed)
Her dorsal foot pain occurred before the pain around her ankle. It suggests an ultrasound that she may have a stress reaction of the base of fourth and tarsal. Likely that she has been compensating with changing her gait as why she is having pain around the ankle. - Placed in a cam walker today. Placed in a cam walker today. - Check vitamin D. - Follow-up 3-4 weeks. Can try weaning out of the cam walker

## 2017-06-10 NOTE — Progress Notes (Signed)
[name] - 52 y.o. female MRN [id number]  Date of birth: Jun 14, 1965  SUBJECTIVE:  Including CC & ROS.  Chief Complaint  Patient presents with  . Right foot pain    [name] is a 52 y.o. female that is presenting with right foot pain. Pain has been ongoing for one month. Pain is located in the dorsal aspect of her foot and medial aspect of her ankle. Pain radiates up to anteriorly to her right leg. Pain is worse upon flexion. Described as sharp constant pain. She has been taking motrin. Denies injury or trauma. Admits to swelling and tenderness. Pain becomes severe towards the end of the day. She has not had any improvement with current therapies.  Independent review of the foot x-ray and tib-fib from 1/28 showed a calcaneal spur on the plantar aspect.  Venous Doppler of the right lower extremity on 1/29 was normal in appearance.   Review of Systems  Constitutional: Negative for fever.  HENT: Negative for rhinorrhea.   Respiratory: Negative for cough.   Cardiovascular: Negative for chest pain.  Gastrointestinal: Negative for abdominal pain.  Musculoskeletal: Positive for gait problem.  Skin: Negative for color change.  Neurological: Negative for weakness.  Hematological: Negative for adenopathy.  Psychiatric/Behavioral: Negative for agitation.    HISTORY: Past Medical, Surgical, Social, and Family History Reviewed & Updated per EMR.   Pertinent Historical Findings include:  Past Medical History:  Diagnosis Date  . Diabetes mellitus without complication ([place])   . Edema 04/25/2015  . Headache(784.0)   . Herniated disc   . History of colonic polyps   . Hypertension   . Obesity   . Osteoarthritis   . Shortness of breath 04/25/2015  . TIA (transient ischemic attack) 2007   hx of  . Vasculitis ([facility])    L leg - Polyarthritis  . Venous insufficiency     Past Surgical History:  Procedure Laterality Date  . ABDOMINAL HYSTERECTOMY    . LAPAROSCOPIC GASTRIC  SLEEVE RESECTION N/A 08/11/2016   Procedure: LAPAROSCOPIC GASTRIC SLEEVE RESECTION, UPPER ENDOSCOPY;  Surgeon: Arta Bruce Kinsinger, MD;  Location: [facility];  Service: General;  Laterality: N/A;    No Known Allergies  Family History  Problem Relation Age of Onset  . Stroke Mother   . Hypertension Mother   . AAA (abdominal aortic aneurysm) Mother   . Hypertension Father   . Cancer Father        Prostate  . Arthritis Other   . Hypertension Other   . Stroke Other      Social History   Socioeconomic History  . Marital status: Single    Spouse name: Not on file  . Number of children: Not on file  . Years of education: Not on file  . Highest education level: Not on file  Social Needs  . Financial resource strain: Not on file  . Food insecurity - worry: Not on file  . Food insecurity - inability: Not on file  . Transportation needs - medical: Not on file  . Transportation needs - non-medical: Not on file  Occupational History  . Not on file  Tobacco Use  . Smoking status: Never Smoker  . Smokeless tobacco: Never Used  Substance and Sexual Activity  . Alcohol use: No    Alcohol/week: 0.0 oz  . Drug use: No  . Sexual activity: Yes    Birth control/protection: Surgical  Other Topics Concern  . Not on file  Social History Narrative  .  Not on file     PHYSICAL EXAM:  VS: BP 138/84 (BP Location: Left Arm, Patient Position: Sitting, Cuff Size: Normal)   Pulse (!) 58   Temp 98.2 F (36.8 C) (Oral)   Ht 5\' 1"  (1.549 m)   SpO2 99%   BMI 39.83 kg/m  Physical Exam Gen: NAD, alert, cooperative with exam, well-appearing ENT: normal lips, normal nasal mucosa,  Eye: normal EOM, normal conjunctiva and lids CV:  no edema, +2 pedal pulses   Resp: no accessory muscle use, non-labored,  Skin: no rashes, no areas of induration  Neuro: normal tone, normal sensation to touch Psych:  normal insight, alert and oriented MSK:  Right foot: Tenderness to palpation over the dorsal  aspect of the fourth metatarsal. Tenderness to palpation of the medial malleolus. Pes planus Loss of the transverse arch. No significant effusion or ecchymosis. Normal dorsiflexion and plantar flexion. Neurovascular intact.  Limited ultrasound: Right foot/ankle:  There is a effusion or on the posterior tibialis as it enters into the tarsal tunnel. Mild degenerative changes occurring at the medial malleolus. No ankle effusion. There appears to be a change in the cortex of the base of the fourth metatarsal to suggest a stress reaction. Normal insertion of the peroneal brevis to the fifth metatarsal Summary: Findings suggestive of a stress reaction of the base of the fourth metatarsal.   Ultrasound and interpretation by Clearance Coots, MD           ASSESSMENT & PLAN:   Stress fracture Her dorsal foot pain occurred before the pain around her ankle. It suggests an ultrasound that she may have a stress reaction of the base of fourth and tarsal. Likely that she has been compensating with changing her gait as why she is having pain around the ankle. - Placed in a cam walker today. Placed in a cam walker today. - Check vitamin D. - Follow-up 3-4 weeks. Can try weaning out of the cam walker

## 2017-06-10 NOTE — Progress Notes (Signed)
   Subjective:  Patient ID: [name], female    DOB: [birth year]  Age: 52 y.o. MRN: [id number]  CC: No chief complaint on file.   HPI [name] presents for she was not seen  Outpatient Medications Prior to Visit  Medication Sig Dispense Refill  . CALCIUM CITRATE CHEWY BITE 500-500 MG-UNIT chewable tablet Chew 3 tablets daily by mouth.  3  . Dietary Management Product (VASCULERA) TABS Take 1 capsule by mouth daily. 30 tablet 11  . Multiple Vitamin (MULTIVITAMIN) tablet Take 1 tablet by mouth daily.    . nebivolol (BYSTOLIC) 5 MG tablet Take 1 tablet (5 mg total) daily by mouth. 90 tablet 0   No facility-administered medications prior to visit.     ROS Review of Systems  Objective:  There were no vitals taken for this visit.  BP Readings from Last 3 Encounters:  05/10/17 130/80  03/10/17 132/82  02/26/17 130/78    Wt Readings from Last 3 Encounters:  05/10/17 210 lb 12.8 oz (95.6 kg)  03/10/17 211 lb (95.7 kg)  02/26/17 210 lb 9.6 oz (95.5 kg)    Physical Exam  Lab Results  Component Value Date   WBC 7.6 02/22/2017   HGB 12.0 02/22/2017   HCT 35.9 (L) 02/22/2017   PLT 290 02/22/2017   GLUCOSE 101 (H) 05/10/2017   CHOL 161 12/18/2015   TRIG 99.0 12/18/2015   HDL 43.20 12/18/2015   LDLCALC 98 12/18/2015   ALT 25 08/12/2016   AST 21 08/12/2016   NA 140 05/10/2017   K 4.1 05/10/2017   CL 105 05/10/2017   CREATININE 0.95 05/10/2017   BUN 14 05/10/2017   CO2 28 05/10/2017   TSH 2.53 12/18/2015   INR 1.0 05/25/2007   HGBA1C 5.9 05/10/2017   MICROALBUR <0.7 05/10/2017    No results found.  Assessment & Plan:   There are no diagnoses linked to this encounter. I am having [name] maintain her multivitamin, CALCIUM CITRATE CHEWY BITE, nebivolol, and VASCULERA.  No orders of the defined types were placed in this encounter.    Follow-up: No Follow-up on file.  Scarlette Calico, MD

## 2017-07-01 ENCOUNTER — Encounter: Payer: Self-pay | Admitting: Internal Medicine

## 2017-07-01 ENCOUNTER — Ambulatory Visit (INDEPENDENT_AMBULATORY_CARE_PROVIDER_SITE_OTHER): Payer: 59 | Admitting: Internal Medicine

## 2017-07-01 VITALS — BP 126/76 | HR 75 | Temp 97.9°F | Ht 61.0 in | Wt 213.0 lb

## 2017-07-01 DIAGNOSIS — R748 Abnormal levels of other serum enzymes: Secondary | ICD-10-CM | POA: Diagnosis not present

## 2017-07-01 DIAGNOSIS — E785 Hyperlipidemia, unspecified: Secondary | ICD-10-CM

## 2017-07-01 DIAGNOSIS — Z1211 Encounter for screening for malignant neoplasm of colon: Secondary | ICD-10-CM | POA: Diagnosis not present

## 2017-07-01 DIAGNOSIS — Z0000 Encounter for general adult medical examination without abnormal findings: Secondary | ICD-10-CM | POA: Diagnosis not present

## 2017-07-01 DIAGNOSIS — E1165 Type 2 diabetes mellitus with hyperglycemia: Secondary | ICD-10-CM

## 2017-07-01 DIAGNOSIS — I10 Essential (primary) hypertension: Secondary | ICD-10-CM

## 2017-07-01 NOTE — Progress Notes (Signed)
Subjective:  Patient ID: [name], female    DOB: [birth year]  Age: 52 y.o. MRN: [id number]  CC: Annual Exam and Hypertension   HPI AMORE ACKMAN presents for a CPX.  She tells me her blood pressure has been well controlled.  She has had no recent episodes of chest pain or shortness of breath.  She feels well today and offers no complaints.  Outpatient Medications Prior to Visit  Medication Sig Dispense Refill  . CALCIUM CITRATE CHEWY BITE 500-500 MG-UNIT chewable tablet Chew 3 tablets daily by mouth.  3  . Multiple Vitamin (MULTIVITAMIN) tablet Take 1 tablet by mouth daily.    . nebivolol (BYSTOLIC) 5 MG tablet Take 1 tablet (5 mg total) daily by mouth. 90 tablet 0  . Dietary Management Product (VASCULERA) TABS Take 1 capsule by mouth daily. (Patient not taking: Reported on 07/01/2017) 30 tablet 11   No facility-administered medications prior to visit.     ROS Review of Systems  Constitutional: Negative.  Negative for appetite change, diaphoresis, fatigue and unexpected weight change.  HENT: Negative.  Negative for trouble swallowing.   Eyes: Negative.  Negative for visual disturbance.  Respiratory: Negative for cough, chest tightness, shortness of breath and wheezing.   Cardiovascular: Negative for chest pain, palpitations and leg swelling.  Gastrointestinal: Negative for abdominal pain, diarrhea, nausea and vomiting.  Endocrine: Negative.   Genitourinary: Negative.   Musculoskeletal: Negative.  Negative for back pain, myalgias and neck pain.  Skin: Negative.  Negative for color change and pallor.  Neurological: Negative.  Negative for dizziness, weakness, light-headedness and headaches.  Hematological: Negative for adenopathy. Does not bruise/bleed easily.  Psychiatric/Behavioral: Negative.     Objective:  BP 126/76 (BP Location: Left Arm, Patient Position: Sitting, Cuff Size: Large)   Pulse 75   Temp 97.9 F (36.6 C) (Oral)   Ht _0  (1.549 m)   Wt 213  lb (96.6 kg)   SpO2 99%   BMI 40.25 kg/m   BP Readings from Last 3 Encounters:  07/01/17 126/76  06/10/17 138/84  05/10/17 130/80    Wt Readings from Last 3 Encounters:  07/01/17 213 lb (96.6 kg)  05/10/17 210 lb 12.8 oz (95.6 kg)  03/10/17 211 lb (95.7 kg)    Physical Exam  Constitutional: She is oriented to person, place, and time. No distress.  HENT:  Mouth/Throat: Oropharynx is clear and moist. No oropharyngeal exudate.  Eyes: Conjunctivae are normal. Left eye exhibits no discharge. No scleral icterus.  Neck: Normal range of motion. Neck supple. No JVD present. No thyromegaly present.  Cardiovascular: Normal rate, regular rhythm and normal heart sounds. Exam reveals no gallop and no friction rub.  No murmur heard. Pulmonary/Chest: Effort normal and breath sounds normal. No respiratory distress. She has no wheezes. She has no rales.  Abdominal: Soft. Bowel sounds are normal. She exhibits no distension and no mass. There is no tenderness. There is no guarding.  Musculoskeletal: Normal range of motion. She exhibits no edema, tenderness or deformity.  Lymphadenopathy:    She has no cervical adenopathy.  Neurological: She is alert and oriented to person, place, and time.  Skin: Skin is warm. No rash noted. She is not diaphoretic. No erythema. No pallor.  Vitals reviewed.   Lab Results  Component Value Date   WBC 7.6 02/22/2017   HGB 12.0 02/22/2017   HCT 35.9 (L) 02/22/2017   PLT 290 02/22/2017   GLUCOSE 101 (H) 05/10/2017   CHOL 161 12/18/2015  TRIG 99.0 12/18/2015   HDL 43.20 12/18/2015   LDLCALC 98 12/18/2015   ALT 25 08/12/2016   AST 21 08/12/2016   NA 140 05/10/2017   K 4.1 05/10/2017   CL 105 05/10/2017   CREATININE 0.95 05/10/2017   BUN 14 05/10/2017   CO2 28 05/10/2017   TSH 2.53 12/18/2015   INR 1.0 05/25/2007   HGBA1C 5.9 05/10/2017   MICROALBUR <0.7 05/10/2017    No results found.  Assessment & Plan:   Arianna was seen today for annual exam  and hypertension.  Diagnoses and all orders for this visit:  Essential hypertension, benign- Her blood pressure is adequately well controlled.  I will monitor her vitamin D level.  I will also check her other labs to screen for endorgan damage and secondary causes of hypertension. -     Comprehensive metabolic panel; Future -     CBC with Differential/Platelet; Future -     Urinalysis, Routine w reflex microscopic; Future -     Thyroid Panel With TSH; Future -     VITAMIN D 25 Hydroxy (Vit-D Deficiency, Fractures); Future  Type 2 diabetes mellitus with hyperglycemia, without long-term current use of insulin ([place])- Her A1c is down to 5.9%.  Her blood sugars are adequately well controlled. -     Comprehensive metabolic panel; Future  Hyperlipidemia with target LDL less than 130- She has a low ASCVD risk score so I do not recommend that she take a statin at this time for CV risk reduction. -     Comprehensive metabolic panel; Future  Alkaline phosphatase elevation- Her isoenzymes 4 years ago indicated that this was probably hepatic in origin.  I will recheck her alk phos level and will also monitor the other liver enzymes. -     Comprehensive metabolic panel; Future  Routine general medical examination at a health care facility- Exam completed, labs reviewed, will order a cologuard to screen for colon cancer/polyps, mammogram and Pap smear are up-to-date, vaccines reviewed and updated, patient education material was given. -     Lipid panel; Future   I am having Catherin D. Quentin Cornwall maintain her multivitamin, CALCIUM CITRATE CHEWY BITE, nebivolol, and VASCULERA.  No orders of the defined types were placed in this encounter.    Follow-up: Return in about 1 year (around 07/02/2018).  Scarlette Calico, MD

## 2017-07-01 NOTE — Patient Instructions (Signed)

## 2017-07-05 NOTE — Addendum Note (Signed)
Addended by: Karle Barr on: 07/05/2017 08:39 AM   Modules accepted: Orders

## 2017-07-06 ENCOUNTER — Encounter: Payer: Self-pay | Admitting: Family Medicine

## 2017-07-06 ENCOUNTER — Ambulatory Visit (INDEPENDENT_AMBULATORY_CARE_PROVIDER_SITE_OTHER): Payer: 59 | Admitting: Family Medicine

## 2017-07-06 DIAGNOSIS — M79671 Pain in right foot: Secondary | ICD-10-CM

## 2017-07-06 DIAGNOSIS — M84374D Stress fracture, right foot, subsequent encounter for fracture with routine healing: Secondary | ICD-10-CM | POA: Diagnosis not present

## 2017-07-06 NOTE — Assessment & Plan Note (Signed)
Appears to be healing.  - will transition out of the cam walker  - can f/u in 3 weeks if still having pain. May need consider MRI if still having pain.

## 2017-07-06 NOTE — Patient Instructions (Signed)
Please ice and elevate the foot.  Try to come out of the boot  Please try the exercises. Don't perform them if there is pain greater than 2/10  Please follow up with me in 3 weeks if you have continued pain.

## 2017-07-06 NOTE — Progress Notes (Signed)
[name] - 52 y.o. female MRN [id number]  Date of birth: [birth year]  SUBJECTIVE:  Including CC & ROS.  Chief Complaint  Patient presents with  . Follow-up    [name] is a 52 y.o. female that is here for a right stress fracture. Pain has been improving. She has been wearing the air cast daily. She has been taking motrin as needed. Has been out of the boot while at home. Still has pain on the dorsal aspect of her midfoot. She is taking the vitamin D. Pain is localized. Has mild swelling at the end of the end.    Review of Systems  Constitutional: Negative for fever.  HENT: Negative for congestion.   Musculoskeletal: Positive for gait problem.  Skin: Negative for color change.    HISTORY: Past Medical, Surgical, Social, and Family History Reviewed & Updated per EMR.   Pertinent Historical Findings include:  Past Medical History:  Diagnosis Date  . Diabetes mellitus without complication ([place])   . Edema 04/25/2015  . Headache(784.0)   . Herniated disc   . History of colonic polyps   . Hypertension   . Obesity   . Osteoarthritis   . Shortness of breath 04/25/2015  . TIA (transient ischemic attack) 2007   hx of  . Vasculitis ([facility])    L leg - Polyarthritis  . Venous insufficiency     Past Surgical History:  Procedure Laterality Date  . ABDOMINAL HYSTERECTOMY    . LAPAROSCOPIC GASTRIC SLEEVE RESECTION N/A 08/11/2016   Procedure: LAPAROSCOPIC GASTRIC SLEEVE RESECTION, UPPER ENDOSCOPY;  Surgeon: Arta Bruce Kinsinger, MD;  Location: [facility];  Service: General;  Laterality: N/A;    No Known Allergies  Family History  Problem Relation Age of Onset  . Stroke Mother   . Hypertension Mother   . AAA (abdominal aortic aneurysm) Mother   . Hypertension Father   . Cancer Father        Prostate  . Arthritis Other   . Hypertension Other   . Stroke Other      Social History   Socioeconomic History  . Marital status: Single    Spouse name: Not on file  .  Number of children: Not on file  . Years of education: Not on file  . Highest education level: Not on file  Occupational History  . Not on file  Social Needs  . Financial resource strain: Not on file  . Food insecurity:    Worry: Not on file    Inability: Not on file  . Transportation needs:    Medical: Not on file    Non-medical: Not on file  Tobacco Use  . Smoking status: Never Smoker  . Smokeless tobacco: Never Used  Substance and Sexual Activity  . Alcohol use: No    Alcohol/week: 0.0 oz  . Drug use: No  . Sexual activity: Yes    Birth control/protection: Surgical  Lifestyle  . Physical activity:    Days per week: Not on file    Minutes per session: Not on file  . Stress: Not on file  Relationships  . Social connections:    Talks on phone: Not on file    Gets together: Not on file    Attends religious service: Not on file    Active member of club or organization: Not on file    Attends meetings of clubs or organizations: Not on file    Relationship status: Not on file  . Intimate partner violence:  Fear of current or ex partner: Not on file    Emotionally abused: Not on file    Physically abused: Not on file    Forced sexual activity: Not on file  Other Topics Concern  . Not on file  Social History Narrative  . Not on file     PHYSICAL EXAM:  VS: BP (!) 142/68 (BP Location: Left Arm, Patient Position: Sitting, Cuff Size: Normal)   Pulse 60   Temp 98.3 F (36.8 C) (Oral)   Ht 5\' 1"  (1.549 m)   SpO2 98%   BMI 40.25 kg/m  Physical Exam Gen: NAD, alert, cooperative with exam, well-appearing ENT: normal lips, normal nasal mucosa,  Eye: normal EOM, normal conjunctiva and lids CV:  no edema, +2 pedal pulses   Resp: no accessory muscle use, non-labored,  Skin: no rashes, no areas of induration  Neuro: normal tone, normal sensation to touch Psych:  normal insight, alert and oriented MSK:  Right foot:  TTP of the 4th base of the MT  No significant  swelling or ecchymosis  Normal ankle ROM  Neurovascularly intact   Limited ultrasound: right foot:  Callus formation appears to be present on the 4th MT base    Summary: healing fracture   Ultrasound and interpretation by Clearance Coots, MD        ASSESSMENT & PLAN:   Stress fracture Appears to be healing.  - will transition out of the cam walker  - can f/u in 3 weeks if still having pain. May need consider MRI if still having pain.

## 2017-07-13 ENCOUNTER — Encounter: Payer: Self-pay | Admitting: Family Medicine

## 2017-07-14 ENCOUNTER — Ambulatory Visit (INDEPENDENT_AMBULATORY_CARE_PROVIDER_SITE_OTHER): Payer: 59

## 2017-07-14 ENCOUNTER — Other Ambulatory Visit: Payer: Self-pay | Admitting: Family Medicine

## 2017-07-14 ENCOUNTER — Encounter: Payer: Self-pay | Admitting: Family Medicine

## 2017-07-14 ENCOUNTER — Ambulatory Visit (INDEPENDENT_AMBULATORY_CARE_PROVIDER_SITE_OTHER): Payer: 59 | Admitting: Family Medicine

## 2017-07-14 ENCOUNTER — Ambulatory Visit: Payer: Self-pay

## 2017-07-14 VITALS — BP 130/78 | HR 62 | Temp 98.4°F | Ht 61.0 in | Wt 208.0 lb

## 2017-07-14 DIAGNOSIS — M79671 Pain in right foot: Secondary | ICD-10-CM

## 2017-07-14 DIAGNOSIS — M84371A Stress fracture, right ankle, initial encounter for fracture: Secondary | ICD-10-CM | POA: Diagnosis not present

## 2017-07-14 DIAGNOSIS — M84374D Stress fracture, right foot, subsequent encounter for fracture with routine healing: Secondary | ICD-10-CM

## 2017-07-14 MED ORDER — MELOXICAM 15 MG PO TABS: 15.0000 mg | ORAL_TABLET | Freq: Every day | ORAL | 0 refills | Status: DC

## 2017-07-14 MED ORDER — TRAMADOL HCL 50 MG PO TABS: 50.0000 mg | ORAL_TABLET | Freq: Three times a day (TID) | ORAL | 0 refills | Status: DC | PRN

## 2017-07-14 NOTE — Progress Notes (Signed)
Still having pain in her right foot. Has been treated for a stress fracture. Will order MRI for occult fracture evaluation   Rosemarie Ax, MD [facility] 07/14/2017, 8:21 AM

## 2017-07-14 NOTE — Assessment & Plan Note (Signed)
She appears to have a significant tendinitis. Likely this was exacerbated since transitioning out of the CAM walker. It doesn't appear the effusion is extending from a joint or fracture  - crutches and Ace wrap today  - mobic and tramadol  - if no improvement consider MRI foot

## 2017-07-14 NOTE — Patient Instructions (Signed)
Please try to ice the area  Please take the mobic for 10 days straight and then as needed  Please try to compress the area  Please follow up with me if the pain doesn't improve at all.

## 2017-07-14 NOTE — Telephone Encounter (Signed)
Pt. Reports increased pain and swelling to right foot. Unable to wear her "boot." Request appointment today. Scheduled. Will leave work. Reason for Disposition . [1] Swollen foot AND [2] no fever  (Exceptions: localized bump from bunions, calluses, insect bite, sting)  Answer Assessment - Initial Assessment Questions 1. ONSET: "When did the pain start?"      Started hurting worse 2 days ago 2. LOCATION: "Where is the pain located?"      Right foot 3. PAIN: "How bad is the pain?"    (Scale 1-10; or mild, moderate, severe)   -  MILD (1-3): doesn't interfere with normal activities    -  MODERATE (4-7): interferes with normal activities (e.g., work or school) or awakens from sleep, limping    -  SEVERE (8-10): excruciating pain, unable to do any normal activities, unable to walk     12 4. WORK OR EXERCISE: "Has there been any recent work or exercise that involved this part of the body?"      No 5. CAUSE: "What do you think is causing the foot pain?"     Stress fracture 6. OTHER SYMPTOMS: "Do you have any other symptoms?" (e.g., leg pain, rash, fever, numbness)     Swelling 7. PREGNANCY: "Is there any chance you are pregnant?" "When was your last menstrual period?"     No  Protocols used: FOOT PAIN-A-AH

## 2017-07-14 NOTE — Progress Notes (Signed)
[name] - 52 y.o. female MRN [id number]  Date of birth: [birth year]  SUBJECTIVE:  Including CC & ROS.  Chief Complaint  Patient presents with  . Right ankle swelling    [name] is a 52 y.o. female that is here today for right ankle swelling. Swelling started three days ago. Denies injury. Admits to tenderness. She has been wearing compression socks. She has been keeping her leg elevated, Pain is mild to severe. Putting weight on it triggers severe pain. She wore her air cast yesterday. Swelling increased last night. The pain has been severe and worse with dorsal flexion. The swelling is occurring on the dorsal aspect of her foot. Denies any injury.     Review of Systems  HENT: Negative for congestion.   Respiratory: Negative for cough.   Cardiovascular: Negative for chest pain.  Gastrointestinal: Negative for abdominal pain.  Musculoskeletal: Positive for gait problem.  Skin: Negative for color change.  Allergic/Immunologic: Negative for immunocompromised state.  Hematological: Negative for adenopathy.  Psychiatric/Behavioral: Negative for agitation.    HISTORY: Past Medical, Surgical, Social, and Family History Reviewed & Updated per EMR.   Pertinent Historical Findings include:  Past Medical History:  Diagnosis Date  . Diabetes mellitus without complication ([place])   . Edema 04/25/2015  . Headache(784.0)   . Herniated disc   . History of colonic polyps   . Hypertension   . Obesity   . Osteoarthritis   . Shortness of breath 04/25/2015  . TIA (transient ischemic attack) 2007   hx of  . Vasculitis ([facility])    L leg - Polyarthritis  . Venous insufficiency     Past Surgical History:  Procedure Laterality Date  . ABDOMINAL HYSTERECTOMY    . LAPAROSCOPIC GASTRIC SLEEVE RESECTION N/A 08/11/2016   Procedure: LAPAROSCOPIC GASTRIC SLEEVE RESECTION, UPPER ENDOSCOPY;  Surgeon: Arta Bruce Kinsinger, MD;  Location: [facility];  Service: General;  Laterality: N/A;    No  Known Allergies  Family History  Problem Relation Age of Onset  . Stroke Mother   . Hypertension Mother   . AAA (abdominal aortic aneurysm) Mother   . Hypertension Father   . Cancer Father        Prostate  . Arthritis Other   . Hypertension Other   . Stroke Other      Social History   Socioeconomic History  . Marital status: Single    Spouse name: Not on file  . Number of children: Not on file  . Years of education: Not on file  . Highest education level: Not on file  Occupational History  . Not on file  Social Needs  . Financial resource strain: Not on file  . Food insecurity:    Worry: Not on file    Inability: Not on file  . Transportation needs:    Medical: Not on file    Non-medical: Not on file  Tobacco Use  . Smoking status: Never Smoker  . Smokeless tobacco: Never Used  Substance and Sexual Activity  . Alcohol use: No    Alcohol/week: 0.0 oz  . Drug use: No  . Sexual activity: Yes    Birth control/protection: Surgical  Lifestyle  . Physical activity:    Days per week: Not on file    Minutes per session: Not on file  . Stress: Not on file  Relationships  . Social connections:    Talks on phone: Not on file    Gets together: Not on file  Attends religious service: Not on file    Active member of club or organization: Not on file    Attends meetings of clubs or organizations: Not on file    Relationship status: Not on file  . Intimate partner violence:    Fear of current or ex partner: Not on file    Emotionally abused: Not on file    Physically abused: Not on file    Forced sexual activity: Not on file  Other Topics Concern  . Not on file  Social History Narrative  . Not on file     PHYSICAL EXAM:  VS: BP 130/78 (BP Location: Right Arm, Patient Position: Sitting, Cuff Size: Normal)   Pulse 62   Temp 98.4 F (36.9 C) (Oral)   Ht 5\' 1"  (1.549 m)   Wt 208 lb (94.3 kg)   SpO2 99%   BMI 39.30 kg/m  Physical Exam Gen: NAD, alert,  cooperative with exam, well-appearing ENT: normal lips, normal nasal mucosa,  Eye: normal EOM, normal conjunctiva and lids CV:  no edema, +2 pedal pulses   Resp: no accessory muscle use, non-labored,  Skin: no rashes, no areas of induration  Neuro: normal tone, normal sensation to touch Psych:  normal insight, alert and oriented MSK:  Right foot:  Obvious swelling of he dorsal foot and ankle.  TTP of the dorsal foot  Pain with passive dorsiflexion  Normal passive ankle ROM  Neurovascularly intact   Limited ultrasound: right foot:  Significant effusion surrounding the common extensors  No tendon rupture is appreciated  Mild swelling of the peroneal brevis and posterior tibialis No effusion from the dorsal midfoot or extension from a fracture    Summary: significant common extensor tendosynovitis.   Ultrasound and interpretation by Clearance Coots, MD      ASSESSMENT & PLAN:   Right foot pain She appears to have a significant tendinitis. Likely this was exacerbated since transitioning out of the CAM walker. It doesn't appear the effusion is extending from a joint or fracture  - crutches and Ace wrap today  - mobic and tramadol  - if no improvement consider MRI foot

## 2017-07-15 ENCOUNTER — Ambulatory Visit (HOSPITAL_COMMUNITY)
Admission: RE | Admit: 2017-07-15 | Discharge: 2017-07-15 | Disposition: A | Discharge status: Home / Self Care | Payer: 59 | Source: Ambulatory Visit | Attending: Cardiovascular Disease | Admitting: Cardiovascular Disease

## 2017-07-15 ENCOUNTER — Encounter (HOSPITAL_COMMUNITY): Payer: Self-pay

## 2017-07-15 ENCOUNTER — Telehealth: Payer: Self-pay | Admitting: Family Medicine

## 2017-07-15 DIAGNOSIS — M7989 Other specified soft tissue disorders: Secondary | ICD-10-CM | POA: Insufficient documentation

## 2017-07-15 DIAGNOSIS — M79671 Pain in right foot: Secondary | ICD-10-CM

## 2017-07-15 NOTE — Telephone Encounter (Signed)
Pt would like to have this done at [facility] [facility] [place] because she works there.  Her direct work number [phone].  Once order placed, she will see if she can get done there today.

## 2017-07-15 NOTE — Telephone Encounter (Signed)
Left VM for patient. If she calls back please have her speak with a nurse/CMA and let her know that I'm ordering an venous duplex to check for a blood clot in her leg.   If any questions then please take the best time and phone number to call and I will try to call her back.   Rosemarie Ax, MD [facility] 07/15/2017, 8:40 AM

## 2017-07-23 ENCOUNTER — Ambulatory Visit: Payer: Self-pay | Admitting: Registered"

## 2017-08-25 ENCOUNTER — Ambulatory Visit (INDEPENDENT_AMBULATORY_CARE_PROVIDER_SITE_OTHER): Payer: Self-pay | Admitting: *Deleted

## 2017-08-25 ENCOUNTER — Telehealth: Payer: Self-pay | Admitting: Cardiovascular Disease

## 2017-08-25 VITALS — BP 139/88 | HR 82

## 2017-08-25 DIAGNOSIS — R002 Palpitations: Secondary | ICD-10-CM

## 2017-08-25 NOTE — Telephone Encounter (Signed)
Patient c/o Palpitations:  High priority if patient c/o lightheadedness, shortness of breath, or chest pain  1) How long have you had palpitations/irregular HR/ Afib? Are you having the symptoms now? Last night, and yes she is having them right now  2) Are you currently experiencing lightheadedness, SOB or CP? no  3) Do you have a history of afib (atrial fibrillation) or irregular heart rhythm? no  4) Have you checked your BP or HR? (document readings if available): no  5) Are you experiencing any other symptoms? no

## 2017-08-25 NOTE — Progress Notes (Signed)
If this is occurring regularly we can have her wear a monitor.  Duration will depend on the frequency.  How often is she having them?

## 2017-08-25 NOTE — Telephone Encounter (Signed)
Received call from patient-patient states she starting having palpitations last night after taking ibuprofen.   Reports they have continued since.    SOB when walking up the stairs this AM. No CP or SOB now.   Patient will have nurse take BP and HR now and return call.    Returned call: BP 143/101 HR 125 Patient has not taken AM Bystolic.  Will take now.   Patient to come to office for EKG at this time.

## 2017-08-25 NOTE — Progress Notes (Signed)
Patient presents for EKG.   Patient reports palpitations beginning last night, started again this AM.   She noticed this started after taking ibuprofen for foot pain.   She reports SOB while walking up the stairs this AM.    See telephone note-HR 125, recommended EKG.    EKG reviewed by Dr. Debara Pickett DOD, Hr 82 BP 139/88.  No urgent recommendations at this time.   Recommended possibly wearing a monitor but would defer to Dr. Oval Linsey.     Patient symptoms resolved.   Aware will make Dr. Oval Linsey aware and will call with any additional recommendations.

## 2017-08-26 ENCOUNTER — Encounter: Payer: Self-pay | Admitting: Internal Medicine

## 2017-08-30 ENCOUNTER — Other Ambulatory Visit: Payer: Self-pay | Admitting: Internal Medicine

## 2017-08-30 DIAGNOSIS — M159 Polyosteoarthritis, unspecified: Secondary | ICD-10-CM

## 2017-08-30 DIAGNOSIS — M150 Primary generalized (osteo)arthritis: Principal | ICD-10-CM

## 2017-08-30 MED ORDER — NAPROXEN 375 MG PO TABS: 375.0000 mg | ORAL_TABLET | Freq: Two times a day (BID) | ORAL | 0 refills | Status: DC

## 2017-08-30 NOTE — Progress Notes (Signed)
Spoke with patient and she has not had any episodes since the 08/25/17 and is feeling much better.

## 2017-08-30 NOTE — Progress Notes (Signed)
Left message to call back  

## 2017-09-13 ENCOUNTER — Telehealth: Payer: Self-pay

## 2017-09-13 NOTE — Telephone Encounter (Signed)
Order# [id number]  The order is due to be cancelled due to inactivity.

## 2017-11-01 ENCOUNTER — Ambulatory Visit: Payer: Self-pay | Admitting: Registered"

## 2017-11-30 ENCOUNTER — Ambulatory Visit: Payer: Self-pay | Admitting: Internal Medicine

## 2017-12-08 ENCOUNTER — Encounter: Payer: Self-pay | Admitting: Internal Medicine

## 2017-12-16 ENCOUNTER — Other Ambulatory Visit (INDEPENDENT_AMBULATORY_CARE_PROVIDER_SITE_OTHER): Payer: 59

## 2017-12-16 ENCOUNTER — Encounter: Payer: Self-pay | Admitting: Internal Medicine

## 2017-12-16 ENCOUNTER — Ambulatory Visit: Payer: 59 | Admitting: Internal Medicine

## 2017-12-16 VITALS — BP 160/100 | HR 64 | Temp 98.4°F | Resp 16 | Ht 61.0 in | Wt 231.5 lb

## 2017-12-16 DIAGNOSIS — E1165 Type 2 diabetes mellitus with hyperglycemia: Secondary | ICD-10-CM

## 2017-12-16 DIAGNOSIS — E559 Vitamin D deficiency, unspecified: Secondary | ICD-10-CM

## 2017-12-16 DIAGNOSIS — I10 Essential (primary) hypertension: Secondary | ICD-10-CM | POA: Diagnosis not present

## 2017-12-16 DIAGNOSIS — Z0000 Encounter for general adult medical examination without abnormal findings: Secondary | ICD-10-CM | POA: Diagnosis not present

## 2017-12-16 DIAGNOSIS — D171 Benign lipomatous neoplasm of skin and subcutaneous tissue of trunk: Secondary | ICD-10-CM | POA: Insufficient documentation

## 2017-12-16 DIAGNOSIS — Z9884 Bariatric surgery status: Secondary | ICD-10-CM | POA: Diagnosis not present

## 2017-12-16 DIAGNOSIS — E785 Hyperlipidemia, unspecified: Secondary | ICD-10-CM | POA: Diagnosis not present

## 2017-12-16 DIAGNOSIS — D539 Nutritional anemia, unspecified: Secondary | ICD-10-CM

## 2017-12-16 DIAGNOSIS — Z1211 Encounter for screening for malignant neoplasm of colon: Secondary | ICD-10-CM

## 2017-12-16 DIAGNOSIS — Z23 Encounter for immunization: Secondary | ICD-10-CM | POA: Diagnosis not present

## 2017-12-16 LAB — LIPID PANEL
CHOL/HDL RATIO: 3
CHOLESTEROL: 179 mg/dL (ref 0–200)
HDL: 69.9 mg/dL (ref >39.00)
LDL CALC: 94 mg/dL (ref 0–99)
NonHDL: 108.64
TRIGLYCERIDES: 75 mg/dL (ref 0.0–149.0)
VLDL: 15 mg/dL (ref 0.0–40.0)

## 2017-12-16 LAB — VITAMIN B12: Vitamin B-12: 453 pg/mL (ref 211–911)

## 2017-12-16 LAB — URINALYSIS, ROUTINE W REFLEX MICROSCOPIC
BILIRUBIN URINE: NEGATIVE
HGB URINE DIPSTICK: NEGATIVE
KETONES UR: NEGATIVE
LEUKOCYTES UA: NEGATIVE
Nitrite: NEGATIVE
PH: 6.5 (ref 5.0–8.0)
RBC / HPF: NONE SEEN (ref 0–?)
Specific Gravity, Urine: 1.02 (ref 1.000–1.030)
TOTAL PROTEIN, URINE-UPE24: NEGATIVE
URINE GLUCOSE: NEGATIVE
UROBILINOGEN UA: 1 (ref 0.0–1.0)

## 2017-12-16 LAB — CBC WITH DIFFERENTIAL/PLATELET
BASOS PCT: 0.8 % (ref 0.0–3.0)
Basophils Absolute: 0 10*3/uL (ref 0.0–0.1)
EOS ABS: 0.2 10*3/uL (ref 0.0–0.7)
Eosinophils Relative: 3.9 % (ref 0.0–5.0)
HEMATOCRIT: 40.1 % (ref 36.0–46.0)
Hemoglobin: 13.3 g/dL (ref 12.0–15.0)
LYMPHS ABS: 2.2 10*3/uL (ref 0.7–4.0)
LYMPHS PCT: 34.1 % (ref 12.0–46.0)
MCHC: 33 g/dL (ref 30.0–36.0)
MCV: 80.1 fl (ref 78.0–100.0)
Monocytes Absolute: 0.5 10*3/uL (ref 0.1–1.0)
Monocytes Relative: 8 % (ref 3.0–12.0)
NEUTROS ABS: 3.4 10*3/uL (ref 1.4–7.7)
Neutrophils Relative %: 53.2 % (ref 43.0–77.0)
PLATELETS: 230 10*3/uL (ref 150.0–400.0)
RBC: 5.01 Mil/uL (ref 3.87–5.11)
RDW: 15.2 % (ref 11.5–15.5)
WBC: 6.3 10*3/uL (ref 4.0–10.5)

## 2017-12-16 LAB — POCT GLYCOSYLATED HEMOGLOBIN (HGB A1C): HEMOGLOBIN A1C: 5.6 % (ref 4.0–5.6)

## 2017-12-16 LAB — IBC PANEL
Iron: 64 ug/dL (ref 42–145)
Saturation Ratios: 16.7 % — ABNORMAL LOW (ref 20.0–50.0)
TRANSFERRIN: 273 mg/dL (ref 212.0–360.0)

## 2017-12-16 LAB — TSH: TSH: 1.6 u[IU]/mL (ref 0.35–4.50)

## 2017-12-16 LAB — FERRITIN: FERRITIN: 92.7 ng/mL (ref 10.0–291.0)

## 2017-12-16 LAB — FOLATE: FOLATE: 10.5 ng/mL (ref >5.9)

## 2017-12-16 NOTE — Patient Instructions (Signed)

## 2017-12-16 NOTE — Progress Notes (Signed)
Subjective:  Patient ID: [name], female    DOB: [birth year]  Age: 52 y.o. MRN: [id number]  CC: Hypertension and Annual Exam   HPI MACHEL VIOLANTE presents for a CPX.  She complains of an enlarging lump on the upper right part of her back. It has been there for many, many years. She wants to know if it can be removed.  She complains that her BP is not well controlled and she has had some LE edema. She is complaint with bystolic.  She is s/p sleeve gastrectomy and is not taking any vit supplements. She has gained 23 lbs over the last 5 months.  Outpatient Medications Prior to Visit  Medication Sig Dispense Refill  . CALCIUM CITRATE CHEWY BITE 500-500 MG-UNIT chewable tablet Chew 3 tablets daily by mouth.  3  . Multiple Vitamin (MULTIVITAMIN) tablet Take 1 tablet by mouth daily.    . naproxen (NAPROSYN) 375 MG tablet Take 1 tablet (375 mg total) by mouth 2 (two) times daily with a meal. 180 tablet 0  . nebivolol (BYSTOLIC) 5 MG tablet Take 1 tablet (5 mg total) daily by mouth. 90 tablet 0  . traMADol (ULTRAM) 50 MG tablet Take 1 tablet (50 mg total) by mouth every 8 (eight) hours as needed. 30 tablet 0   No facility-administered medications prior to visit.     ROS Review of Systems  Constitutional: Positive for unexpected weight change. Negative for appetite change, diaphoresis and fatigue.  HENT: Negative.   Eyes: Negative for visual disturbance.  Respiratory: Negative for apnea, cough, chest tightness, shortness of breath and wheezing.   Cardiovascular: Positive for leg swelling. Negative for chest pain and palpitations.  Gastrointestinal: Negative for abdominal pain, constipation, diarrhea, nausea and vomiting.  Endocrine: Negative.   Genitourinary: Negative.  Negative for decreased urine volume, difficulty urinating and urgency.  Musculoskeletal: Negative.  Negative for arthralgias and myalgias.  Skin: Negative.  Negative for color change.  Allergic/Immunologic:  Negative.   Neurological: Negative.  Negative for dizziness, weakness and light-headedness.  Hematological: Negative for adenopathy. Does not bruise/bleed easily.  Psychiatric/Behavioral: Negative.     Objective:  BP (!) 160/100 (BP Location: Left Arm, Patient Position: Sitting, Cuff Size: Large)   Pulse 64   Temp 98.4 F (36.9 C) (Oral)   Resp 16   Ht 5\' 1"  (1.549 m)   Wt 231 lb 8 oz (105 kg)   SpO2 98%   BMI 43.74 kg/m   BP Readings from Last 3 Encounters:  12/16/17 (!) 160/100  08/25/17 139/88  07/14/17 130/78    Wt Readings from Last 3 Encounters:  12/16/17 231 lb 8 oz (105 kg)  07/14/17 208 lb (94.3 kg)  07/01/17 213 lb (96.6 kg)    Physical Exam  Constitutional: She is oriented to person, place, and time. No distress.  HENT:  Mouth/Throat: Oropharynx is clear and moist. No oropharyngeal exudate.  Eyes: Conjunctivae are normal. No scleral icterus.  Neck: Normal range of motion. Neck supple. No JVD present. No thyromegaly present.  Cardiovascular: Normal rate, regular rhythm and normal heart sounds. Exam reveals no gallop.  No murmur heard. Pulmonary/Chest: Effort normal and breath sounds normal. No respiratory distress. She has no wheezes. She has no rales.  Abdominal: Soft. Normal appearance and bowel sounds are normal. She exhibits no mass. There is no hepatosplenomegaly. There is no tenderness.  Musculoskeletal: Normal range of motion. She exhibits edema. She exhibits no tenderness or deformity.       Back:  Trace LE pitting edema  Lymphadenopathy:    She has no cervical adenopathy.  Neurological: She is alert and oriented to person, place, and time.  Skin: Skin is warm and dry. She is not diaphoretic. No pallor.  Psychiatric: She has a normal mood and affect. Her behavior is normal. Judgment and thought content normal.  Vitals reviewed.   Lab Results  Component Value Date   WBC 6.3 12/16/2017   HGB 13.3 12/16/2017   HCT 40.1 12/16/2017   PLT 230.0  12/16/2017   GLUCOSE 85 12/17/2017   CHOL 179 12/16/2017   TRIG 75.0 12/16/2017   HDL 69.90 12/16/2017   LDLCALC 94 12/16/2017   ALT 25 08/12/2016   AST 21 08/12/2016   NA 139 12/17/2017   K 4.1 12/17/2017   CL 103 12/17/2017   CREATININE 0.94 12/17/2017   BUN 19 12/17/2017   CO2 29 12/17/2017   TSH 1.60 12/16/2017   INR 1.0 05/25/2007   HGBA1C 5.6 12/16/2017   MICROALBUR <0.7 05/10/2017    No results found.  Assessment & Plan:   Vannah was seen today for hypertension and annual exam.  Diagnoses and all orders for this visit:  Type 2 diabetes mellitus with hyperglycemia, without long-term current use of insulin ([place])- Her blood sugar is well controlled. -     POCT glycosylated hemoglobin (Hb A1C)  Need for influenza vaccination -     Flu Vaccine QUAD 36+ mos IM  Colon cancer screening -     Ambulatory referral to Gastroenterology  Essential hypertension, benign- Her BP is not well controlled. I will treat the Vit D defic and will add a diuretic to the bystolic. -     TSH; Future -     Urinalysis, Routine w reflex microscopic; Future -     VITAMIN D 25 Hydroxy (Vit-D Deficiency, Fractures); Future -     Basic metabolic panel; Future -     triamterene-hydrochlorothiazide (DYAZIDE) 37.5-25 MG capsule; Take 1 each (1 capsule total) by mouth daily.  Hyperlipidemia with target LDL less than 130- Her ASCVD risk score is not elevated so I don't recommend a statin for CV risk reduction -     TSH; Future  Routine general medical examination at a health care facility- Exam completed, labs reviewed, vaccines updated, she is referred for a screening colonoscopy, her PAP and mammo are UTD. Pt ed material was given. -     Lipid panel; Future  S/P laparoscopic sleeve gastrectomy -     Vitamin B12; Future -     IBC panel; Future -     Folate; Future -     Ferritin; Future -     VITAMIN D 25 Hydroxy (Vit-D Deficiency, Fractures); Future -     Vitamin B1; Future -      Cholecalciferol 50000 units capsule; Take 1 capsule (50,000 Units total) by mouth once a week.  Deficiency anemia- Her H/H are normal now. O/t Vit d defic her other vit levels are normal -     CBC with Differential/Platelet; Future -     Vitamin B12; Future -     IBC panel; Future -     Folate; Future -     Ferritin; Future -     Vitamin B1; Future  Lipoma of back- She will see GS to consider treatment options -     Ambulatory referral to General Surgery  Vitamin D deficiency disease -     Cholecalciferol 50000 units capsule; Take 1  capsule (50,000 Units total) by mouth once a week.   I am having Joelly D. Robinson start on Cholecalciferol and triamterene-hydrochlorothiazide. I am also having her maintain her multivitamin, CALCIUM CITRATE CHEWY BITE, nebivolol, traMADol, and naproxen.  Meds ordered this encounter  Medications  . Cholecalciferol 50000 units capsule    Sig: Take 1 capsule (50,000 Units total) by mouth once a week.    Dispense:  12 capsule    Refill:  1  . triamterene-hydrochlorothiazide (DYAZIDE) 37.5-25 MG capsule    Sig: Take 1 each (1 capsule total) by mouth daily.    Dispense:  90 capsule    Refill:  0     Follow-up: Return in about 2 months (around 02/15/2018).  Scarlette Calico, MD

## 2017-12-17 ENCOUNTER — Other Ambulatory Visit (INDEPENDENT_AMBULATORY_CARE_PROVIDER_SITE_OTHER): Payer: 59

## 2017-12-17 DIAGNOSIS — E559 Vitamin D deficiency, unspecified: Secondary | ICD-10-CM | POA: Insufficient documentation

## 2017-12-17 DIAGNOSIS — I10 Essential (primary) hypertension: Secondary | ICD-10-CM | POA: Diagnosis not present

## 2017-12-17 LAB — BASIC METABOLIC PANEL
BUN: 19 mg/dL (ref 6–23)
CO2: 29 mEq/L (ref 19–32)
Calcium: 9.4 mg/dL (ref 8.4–10.5)
Chloride: 103 mEq/L (ref 96–112)
Creatinine, Ser: 0.94 mg/dL (ref 0.40–1.20)
GFR: 80.78 mL/min (ref >60.00)
Glucose, Bld: 85 mg/dL (ref 70–99)
Potassium: 4.1 mEq/L (ref 3.5–5.1)
Sodium: 139 mEq/L (ref 135–145)

## 2017-12-17 LAB — VITAMIN D 25 HYDROXY (VIT D DEFICIENCY, FRACTURES): VITD: 16.74 ng/mL — ABNORMAL LOW (ref 30.00–100.00)

## 2017-12-17 MED ORDER — CHOLECALCIFEROL 1.25 MG (50000 UT) PO CAPS: 50000.0000 [IU] | ORAL_CAPSULE | ORAL | 1 refills | Status: DC

## 2017-12-18 ENCOUNTER — Encounter: Payer: Self-pay | Admitting: Internal Medicine

## 2017-12-18 MED ORDER — TRIAMTERENE-HCTZ 37.5-25 MG PO CAPS: 1.0000 | ORAL_CAPSULE | Freq: Every day | ORAL | 0 refills | Status: DC

## 2017-12-19 LAB — VITAMIN B1: Vitamin B1 (Thiamine): 8 nmol/L (ref 8–30)

## 2017-12-20 ENCOUNTER — Encounter (HOSPITAL_COMMUNITY): Payer: Self-pay

## 2017-12-20 ENCOUNTER — Emergency Department (HOSPITAL_COMMUNITY)
Admission: EM | Admit: 2017-12-20 | Discharge: 2017-12-20 | Disposition: A | Discharge status: Home / Self Care | Payer: 59 | Attending: Emergency Medicine | Admitting: Emergency Medicine

## 2017-12-20 ENCOUNTER — Emergency Department (HOSPITAL_COMMUNITY): Payer: 59

## 2017-12-20 ENCOUNTER — Other Ambulatory Visit: Payer: Self-pay

## 2017-12-20 ENCOUNTER — Ambulatory Visit: Payer: Self-pay | Admitting: Internal Medicine

## 2017-12-20 DIAGNOSIS — I10 Essential (primary) hypertension: Secondary | ICD-10-CM | POA: Insufficient documentation

## 2017-12-20 DIAGNOSIS — R519 Headache, unspecified: Secondary | ICD-10-CM

## 2017-12-20 DIAGNOSIS — I159 Secondary hypertension, unspecified: Secondary | ICD-10-CM

## 2017-12-20 DIAGNOSIS — Z79899 Other long term (current) drug therapy: Secondary | ICD-10-CM | POA: Diagnosis not present

## 2017-12-20 DIAGNOSIS — R51 Headache: Secondary | ICD-10-CM

## 2017-12-20 DIAGNOSIS — E119 Type 2 diabetes mellitus without complications: Secondary | ICD-10-CM | POA: Insufficient documentation

## 2017-12-20 MED ORDER — PROCHLORPERAZINE MALEATE 10 MG PO TABS
10.0000 mg | ORAL_TABLET | Freq: Once | ORAL | Status: AC
  Administered 2017-12-20: 10 mg via ORAL
  Filled 2017-12-20: qty 1

## 2017-12-20 MED ORDER — TRIAMTERENE-HCTZ 37.5-25 MG PO CAPS
1.0000 | ORAL_CAPSULE | Freq: Every day | ORAL | Status: DC
  Administered 2017-12-20: 1 via ORAL
  Filled 2017-12-20: qty 1

## 2017-12-20 MED ORDER — DIPHENHYDRAMINE HCL 25 MG PO CAPS
25.0000 mg | ORAL_CAPSULE | Freq: Once | ORAL | Status: AC
  Administered 2017-12-20: 25 mg via ORAL
  Filled 2017-12-20: qty 1

## 2017-12-20 MED ORDER — DEXAMETHASONE 4 MG PO TABS
10.0000 mg | ORAL_TABLET | Freq: Once | ORAL | Status: AC
  Administered 2017-12-20: 10 mg via ORAL
  Filled 2017-12-20: qty 2

## 2017-12-20 NOTE — ED Notes (Signed)
Patient transported to CT 

## 2017-12-20 NOTE — ED Notes (Signed)
ED Provider at bedside. 

## 2017-12-20 NOTE — Telephone Encounter (Signed)
Patient states she has had headache since Saturday- yesterday her BP was 108/100- it did come down with rest. Patient was seen last week for her BP elevation and she was given an additional medication- diuretic. Patient has not started it because she has changed pharmacies and it has not been transferred. Patient should be able to start today. Due to her elevated BP and symptoms- patient advised to go to ED- she agrees and will go to [facility]- it is closest. Patient advised to call office after her ED visit to schedule ED follow up visit.Flow coordinator notified.    Reason for Disposition . [7] Systolic BP  >= 939 OR Diastolic >= 030 AND [0] cardiac or neurologic symptoms (e.g., chest pain, difficulty breathing, unsteady gait, blurred vision)  Answer Assessment - Initial Assessment Questions 1. BLOOD PRESSURE: "What is the blood pressure?" "Did you take at least two measurements 5 minutes apart?"     153/94 this morning ,163/112 P 79 2. ONSET: "When did you take your blood pressure?"     This morning-7:30, 10:00 3. HOW: "How did you obtain the blood pressure?" (e.g., visiting nurse, automatic home BP monitor)     Automatic cuff- left arm 4. HISTORY: "Do you have a history of high blood pressure?"     yes 5. MEDICATIONS: "Are you taking any medications for blood pressure?" "Have you missed any doses recently?"     Yes- has not started new medication yet 6. OTHER SYMPTOMS: "Do you have any symptoms?" (e.g., headache, chest pain, blurred vision, difficulty breathing, weakness)     Headache, sweating episodes 7. PREGNANCY: "Is there any chance you are pregnant?" "When was your last menstrual period?"     n/a  Protocols used: HIGH BLOOD PRESSURE-A-AH

## 2017-12-20 NOTE — ED Triage Notes (Signed)
Patient states she saw her PCP 4 days ago regarding hypertension. Patient states her PCP added a diuretic to her meds. Patient c/o still having hypertensin and a headache. BP in triage-187/101.

## 2017-12-20 NOTE — ED Provider Notes (Signed)
[facility] DEPT Provider Note   CSN: [id number] Arrival date & time: 12/20/17  1059     History   Chief Complaint Chief Complaint  Patient presents with  . Headache  . Hypertension    HPI [name] is a 52 y.o. female.  The history is provided by the patient.  Migraine  This is a new problem. The current episode started more than 2 days ago. The problem occurs every several days. The problem has been gradually improving. Associated symptoms include headaches. Pertinent negatives include no chest pain, no abdominal pain and no shortness of breath. Nothing aggravates the symptoms. The symptoms are relieved by NSAIDs. She has tried acetaminophen for the symptoms. The treatment provided mild relief.    Past Medical History:  Diagnosis Date  . Diabetes mellitus without complication ([place])   . Edema 04/25/2015  . Headache(784.0)   . Herniated disc   . History of colonic polyps   . Hypertension   . Obesity   . Osteoarthritis   . Shortness of breath 04/25/2015  . TIA (transient ischemic attack) 2007   hx of  . Vasculitis ([facility])    L leg - Polyarthritis  . Venous insufficiency     Patient Active Problem List   Diagnosis Date Noted  . Vitamin D deficiency disease 12/17/2017  . S/P laparoscopic sleeve gastrectomy 12/16/2017  . Deficiency anemia 12/16/2017  . Lipoma of back 12/16/2017  . Stress fracture 06/10/2017  . Chronic venous insufficiency 05/10/2017  . Venous stasis syndrome 10/27/2016  . Snoring 12/05/2015  . Arthritis 01/09/2015  . Type 2 diabetes mellitus with hyperglycemia, without long-term current use of insulin ([place]) 09/13/2014  . Hyperlipidemia with target LDL less than 130 09/13/2014  . Dermatitis, asteototic 09/05/2014  . Alkaline phosphatase elevation 04/25/2013  . Carpal tunnel syndrome 12/30/2012  . Other screening mammogram 10/10/2012  . Migraine 08/07/2011  . Severe obesity (BMI >= 40) ([place]) 12/25/2010  .  Routine general medical examination at a health care facility 09/16/2010  . Essential hypertension, benign 02/27/2009  . Osteoarthritis 02/27/2009    Past Surgical History:  Procedure Laterality Date  . ABDOMINAL HYSTERECTOMY    . LAPAROSCOPIC GASTRIC SLEEVE RESECTION N/A 08/11/2016   Procedure: LAPAROSCOPIC GASTRIC SLEEVE RESECTION, UPPER ENDOSCOPY;  Surgeon: Arta Bruce Kinsinger, MD;  Location: [facility] ORS;  Service: General;  Laterality: N/A;     OB History   None      Home Medications    Prior to Admission medications   Medication Sig Start Date End Date Taking? Authorizing Provider  calcium carbonate (TUMS EX) 750 MG chewable tablet Chew 1,500 mg by mouth daily.   Yes [provider]  ibuprofen (ADVIL,MOTRIN) 200 MG tablet Take 400-800 mg by mouth every 6 (six) hours as needed for headache or moderate pain.   Yes [provider]  naproxen (NAPROSYN) 375 MG tablet Take 1 tablet (375 mg total) by mouth 2 (two) times daily with a meal. Patient taking differently: Take 375 mg by mouth 2 (two) times daily as needed.  08/30/17  Yes Janith Lima, MD  nebivolol (BYSTOLIC) 5 MG tablet Take 1 tablet (5 mg total) daily by mouth. 02/23/17  Yes Rosemarie Ax, MD  Cholecalciferol 50000 units capsule Take 1 capsule (50,000 Units total) by mouth once a week. 12/17/17   Janith Lima, MD  Multiple Vitamin (MULTIVITAMIN) tablet Take 1 tablet by mouth daily.    [provider]  traMADol (ULTRAM) 50 MG tablet  Take 1 tablet (50 mg total) by mouth every 8 (eight) hours as needed. Patient not taking: Reported on 12/20/2017 07/14/17   Rosemarie Ax, MD  triamterene-hydrochlorothiazide (DYAZIDE) 37.5-25 MG capsule Take 1 each (1 capsule total) by mouth daily. 12/18/17   Janith Lima, MD    Family History Family History  Problem Relation Age of Onset  . Stroke Mother   . Hypertension Mother   . AAA (abdominal aortic aneurysm) Mother   . Hypertension Father   . Cancer  Father        Prostate  . Arthritis Other   . Hypertension Other   . Stroke Other     Social History Social History   Tobacco Use  . Smoking status: Never Smoker  . Smokeless tobacco: Never Used  Substance Use Topics  . Alcohol use: No    Alcohol/week: 0.0 standard drinks  . Drug use: No     Allergies   Patient has no known allergies.   Review of Systems Review of Systems  Constitutional: Negative for chills and fever.  HENT: Negative for ear pain and sore throat.   Eyes: Negative for pain and visual disturbance.  Respiratory: Negative for cough and shortness of breath.   Cardiovascular: Negative for chest pain and palpitations.  Gastrointestinal: Negative for abdominal pain and vomiting.  Genitourinary: Negative for dysuria and hematuria.  Musculoskeletal: Negative for arthralgias and back pain.  Skin: Negative for color change and rash.  Neurological: Positive for headaches. Negative for seizures and syncope.  All other systems reviewed and are negative.    Physical Exam Updated Vital Signs  ED Triage Vitals  Enc Vitals Group     BP 12/20/17 1105 (!) 187/101     Pulse Rate 12/20/17 1105 75     Resp 12/20/17 1105 18     Temp 12/20/17 1105 97.8 F (36.6 C)     Temp Source 12/20/17 1105 Oral     SpO2 12/20/17 1105 100 %     Weight 12/20/17 1128 223 lb (101.2 kg)     Height 12/20/17 1128 5\' 1"  (1.549 m)     Head Circumference --      Peak Flow --      Pain Score 12/20/17 1127 4     Pain Loc --      Pain Edu? --      Excl. in [place]? --     Physical Exam  Constitutional: She is oriented to person, place, and time. She appears well-developed and well-nourished. No distress.  HENT:  Head: Normocephalic and atraumatic.  Eyes: Pupils are equal, round, and reactive to light. Conjunctivae and EOM are normal.  Neck: Normal range of motion. Neck supple.  Cardiovascular: Normal rate, regular rhythm, normal heart sounds and intact distal pulses.  No murmur  heard. Pulmonary/Chest: Effort normal and breath sounds normal. No respiratory distress.  Abdominal: Soft. There is no tenderness.  Musculoskeletal: Normal range of motion. She exhibits no edema.  Neurological: She is alert and oriented to person, place, and time. She has normal strength. No cranial nerve deficit or sensory deficit. Coordination and gait normal.  Skin: Skin is warm and dry.  Psychiatric: She has a normal mood and affect.  Nursing note and vitals reviewed.    ED Treatments / Results  Labs (all labs ordered are listed, but only abnormal results are displayed) Labs Reviewed - No data to display  EKG EKG Interpretation  Date/Time:  Monday December 20 2017 12:05:52 EDT Ventricular Rate:  60 PR Interval:    QRS Duration: 100 QT Interval:  402 QTC Calculation: 402 R Axis:   -17 Text Interpretation:  Sinus rhythm Borderline left axis deviation Confirmed by Lennice Sites [id number]) on 12/20/2017 12:27:44 PM   Radiology Ct Head Wo Contrast  Result Date: 12/20/2017 CLINICAL DATA:  Hypertension, headache EXAM: CT HEAD WITHOUT CONTRAST TECHNIQUE: Contiguous axial images were obtained from the base of the skull through the vertex without intravenous contrast. COMPARISON:  05/25/2007 FINDINGS: Brain: No acute intracranial abnormality. Specifically, no hemorrhage, hydrocephalus, mass lesion, acute infarction, or significant intracranial injury. Vascular: No hyperdense vessel or unexpected calcification. Skull: No acute calvarial abnormality. Sinuses/Orbits: Visualized paranasal sinuses and mastoids clear. Orbital soft tissues unremarkable. Other: None IMPRESSION: Normal study. Electronically Signed   By: Rolm Baptise M.D.   On: 12/20/2017 14:00    Procedures Procedures (including critical care time)  Medications Ordered in ED Medications  diphenhydrAMINE (BENADRYL) capsule 25 mg (25 mg Oral Given 12/20/17 1318)  prochlorperazine (COMPAZINE) tablet 10 mg (10 mg Oral Given 12/20/17  1318)  dexamethasone (DECADRON) tablet 10 mg (10 mg Oral Given 12/20/17 1450)     Initial Impression / Assessment and Plan / ED Course  I have reviewed the triage vital signs and the nursing notes.  Pertinent labs & imaging results that were available during my care of the patient were reviewed by me and considered in my medical decision making (see chart for details).      [name] is a 52 year old female history of hypertension, migraines who presents to the ED with migraine headache.  Patient with hypertension upon arrival but otherwise normal vitals.  Patient concerned for migraine over the last several days.  No neuro deficits.  Has also noticed elevated blood pressure but denies any chest pain, abdominal pain.  Patient recently started on new blood pressure medication but has not been able to fill the medication yet.  Denies any shortness of breath.  Patient denies any vision changes.  Has some photophobia similar to prior migraines.  Patient with normal neurological exam.  CT of the head showed no acute findings.  Patient was given Compazine, Benadryl, Decadron with improvement of her headache.  Patient was also given her hypertension medicine and overall had improvement on her blood pressure and overall symptoms. Recent lab work with PCP shows normal kidney fxn. EKG wnl. Patient with asymptomatic HTN, given reassurance. Discharged in ED good condition.  Recommend follow-up with primary care doctor.  This chart was dictated using voice recognition software.  Despite best efforts to proofread,  errors can occur which can change the documentation meaning.  Final Clinical Impressions(s) / ED Diagnoses   Final diagnoses:  Secondary hypertension  Nonintractable headache, unspecified chronicity pattern, unspecified headache type    ED Discharge Orders    None       Lennice Sites, DO 12/20/17 2016

## 2018-01-05 ENCOUNTER — Encounter: Payer: Self-pay | Admitting: Gastroenterology

## 2018-01-25 ENCOUNTER — Telehealth: Payer: Self-pay | Admitting: *Deleted

## 2018-01-25 NOTE — Telephone Encounter (Signed)
Spoke with patient. Patient was in the ED for high BP on 12/20/17. Patient is to follow up with PCP Dr.Jones. Patient has appointment for 02/17/18 with Dr.Jones the day after colonoscopy. Explained to the patient that she should follow up with Dr.Jones to make sure BP is stable before colonoscopy. Pt understands. Patient rescheduled colon for 03/02/18 and PV 02/22/18. Pt denies any GI concerns.

## 2018-02-16 ENCOUNTER — Encounter: Payer: Self-pay | Admitting: Gastroenterology

## 2018-02-17 ENCOUNTER — Ambulatory Visit: Payer: 59 | Admitting: Internal Medicine

## 2018-02-17 ENCOUNTER — Encounter: Payer: Self-pay | Admitting: Internal Medicine

## 2018-02-17 ENCOUNTER — Other Ambulatory Visit (INDEPENDENT_AMBULATORY_CARE_PROVIDER_SITE_OTHER): Payer: 59

## 2018-02-17 VITALS — BP 122/74 | HR 63 | Temp 98.0°F | Resp 16 | Ht 61.0 in | Wt 232.0 lb

## 2018-02-17 DIAGNOSIS — I10 Essential (primary) hypertension: Secondary | ICD-10-CM

## 2018-02-17 DIAGNOSIS — E1165 Type 2 diabetes mellitus with hyperglycemia: Secondary | ICD-10-CM

## 2018-02-17 LAB — BASIC METABOLIC PANEL
BUN: 26 mg/dL — ABNORMAL HIGH (ref 6–23)
CO2: 34 mEq/L — ABNORMAL HIGH (ref 19–32)
Calcium: 10 mg/dL (ref 8.4–10.5)
Chloride: 98 mEq/L (ref 96–112)
Creatinine, Ser: 1.16 mg/dL (ref 0.40–1.20)
GFR: 63.33 mL/min (ref >60.00)
GLUCOSE: 100 mg/dL — AB (ref 70–99)
POTASSIUM: 3.9 meq/L (ref 3.5–5.1)
SODIUM: 137 meq/L (ref 135–145)

## 2018-02-17 NOTE — Patient Instructions (Signed)

## 2018-02-17 NOTE — Progress Notes (Signed)
Subjective:  Patient ID: [name], female    DOB: 19-Nov-1965  Age: 52 y.o. MRN: [id number]  CC: Hypertension   HPI [name] presents for a BP check - She tells me her blood pressure has been well controlled.  She feels well and offers no complaints.  Outpatient Medications Prior to Visit  Medication Sig Dispense Refill  . calcium carbonate (TUMS EX) 750 MG chewable tablet Chew 1,500 mg by mouth daily.    . Cholecalciferol 50000 units capsule Take 1 capsule (50,000 Units total) by mouth once a week. 12 capsule 1  . ibuprofen (ADVIL,MOTRIN) 200 MG tablet Take 400-800 mg by mouth every 6 (six) hours as needed for headache or moderate pain.    . Multiple Vitamin (MULTIVITAMIN) tablet Take 1 tablet by mouth daily.    . nebivolol (BYSTOLIC) 5 MG tablet Take 1 tablet (5 mg total) daily by mouth. 90 tablet 0  . triamterene-hydrochlorothiazide (DYAZIDE) 37.5-25 MG capsule Take 1 each (1 capsule total) by mouth daily. 90 capsule 0  . naproxen (NAPROSYN) 375 MG tablet Take 1 tablet (375 mg total) by mouth 2 (two) times daily with a meal. (Patient taking differently: Take 375 mg by mouth 2 (two) times daily as needed. ) 180 tablet 0  . traMADol (ULTRAM) 50 MG tablet Take 1 tablet (50 mg total) by mouth every 8 (eight) hours as needed. 30 tablet 0   No facility-administered medications prior to visit.     ROS Review of Systems  Constitutional: Negative.  Negative for appetite change, diaphoresis, fatigue and unexpected weight change.  HENT: Negative.   Eyes: Negative for visual disturbance.  Respiratory: Negative for cough, chest tightness, shortness of breath and wheezing.   Cardiovascular: Negative for chest pain, palpitations and leg swelling.  Gastrointestinal: Negative for abdominal pain, constipation, diarrhea, nausea and vomiting.  Endocrine: Negative.   Genitourinary: Negative.  Negative for difficulty urinating.  Musculoskeletal: Negative.  Negative for arthralgias  and myalgias.  Skin: Negative.  Negative for color change.  Neurological: Negative.  Negative for dizziness, weakness and light-headedness.  Hematological: Negative for adenopathy. Does not bruise/bleed easily.  Psychiatric/Behavioral: Negative.     Objective:  BP 122/74 (BP Location: Left Arm, Patient Position: Sitting, Cuff Size: Normal)   Pulse 63   Temp 98 F (36.7 C) (Oral)   Resp 16   Ht 5\' 1"  (1.549 m)   Wt 232 lb (105.2 kg)   SpO2 99%   BMI 43.84 kg/m   BP Readings from Last 3 Encounters:  02/17/18 122/74  12/20/17 (!) 172/99  12/16/17 (!) 160/100    Wt Readings from Last 3 Encounters:  02/17/18 232 lb (105.2 kg)  12/20/17 223 lb (101.2 kg)  12/16/17 231 lb 8 oz (105 kg)    Physical Exam  Constitutional: She is oriented to person, place, and time. No distress.  HENT:  Mouth/Throat: Oropharynx is clear and moist. No oropharyngeal exudate.  Eyes: Conjunctivae are normal. No scleral icterus.  Neck: Normal range of motion. Neck supple. No JVD present. No thyromegaly present.  Cardiovascular: Normal rate, regular rhythm and normal heart sounds. Exam reveals no gallop.  No murmur heard. Pulmonary/Chest: Effort normal and breath sounds normal. No respiratory distress. She has no wheezes. She has no rales.  Abdominal: Soft. Bowel sounds are normal. She exhibits no mass. There is no tenderness.  Musculoskeletal: Normal range of motion. She exhibits no edema, tenderness or deformity.  Lymphadenopathy:    She has no cervical adenopathy.  Neurological: She is alert and oriented to person, place, and time.  Skin: Skin is warm and dry. She is not diaphoretic. No pallor.  Vitals reviewed.   Lab Results  Component Value Date   WBC 6.3 12/16/2017   HGB 13.3 12/16/2017   HCT 40.1 12/16/2017   PLT 230.0 12/16/2017   GLUCOSE 100 (H) 02/17/2018   CHOL 179 12/16/2017   TRIG 75.0 12/16/2017   HDL 69.90 12/16/2017   LDLCALC 94 12/16/2017   ALT 25 08/12/2016   AST 21  08/12/2016   NA 137 02/17/2018   K 3.9 02/17/2018   CL 98 02/17/2018   CREATININE 1.16 02/17/2018   BUN 26 (H) 02/17/2018   CO2 34 (H) 02/17/2018   TSH 1.60 12/16/2017   INR 1.0 05/25/2007   HGBA1C 5.6 12/16/2017   MICROALBUR <0.7 05/10/2017    Ct Head Wo Contrast  Result Date: 12/20/2017 CLINICAL DATA:  Hypertension, headache EXAM: CT HEAD WITHOUT CONTRAST TECHNIQUE: Contiguous axial images were obtained from the base of the skull through the vertex without intravenous contrast. COMPARISON:  05/25/2007 FINDINGS: Brain: No acute intracranial abnormality. Specifically, no hemorrhage, hydrocephalus, mass lesion, acute infarction, or significant intracranial injury. Vascular: No hyperdense vessel or unexpected calcification. Skull: No acute calvarial abnormality. Sinuses/Orbits: Visualized paranasal sinuses and mastoids clear. Orbital soft tissues unremarkable. Other: None IMPRESSION: Normal study. Electronically Signed   By: Rolm Baptise M.D.   On: 12/20/2017 14:00    Assessment & Plan:   [name] was seen today for hypertension.  Diagnoses and all orders for this visit:  Essential hypertension, benign- Her blood pressure is well controlled.  Electrolytes and renal function are normal.  Will continue the current regimen. -     Basic metabolic panel; Future  Type 2 diabetes mellitus with hyperglycemia, without long-term current use of insulin ([name])- Her blood sugars are adequately well controlled. -     HM Diabetes Foot Exam   I have discontinued [name]'s traMADol and naproxen. I am also having her maintain her multivitamin, nebivolol, Cholecalciferol, triamterene-hydrochlorothiazide, calcium carbonate, and ibuprofen.  No orders of the defined types were placed in this encounter.    Follow-up: Return in about 6 months (around 08/18/2018).  Natalie Calico, MD

## 2018-02-22 ENCOUNTER — Encounter: Payer: Self-pay | Admitting: Gastroenterology

## 2018-02-22 ENCOUNTER — Ambulatory Visit (AMBULATORY_SURGERY_CENTER): Payer: Self-pay

## 2018-02-22 VITALS — Ht 62.0 in | Wt 236.8 lb

## 2018-02-22 DIAGNOSIS — Z1211 Encounter for screening for malignant neoplasm of colon: Secondary | ICD-10-CM

## 2018-02-22 MED ORDER — PEG 3350-KCL-NA BICARB-NACL 420 G PO SOLR: 4000.0000 mL | Freq: Once | ORAL | 0 refills | Status: AC

## 2018-02-22 NOTE — Progress Notes (Signed)
Denies allergies to eggs or soy products. Denies complication of anesthesia or sedation. Denies use of weight loss medication. Denies use of O2.   Emmi instructions declined.  

## 2018-02-23 ENCOUNTER — Encounter: Payer: Self-pay | Admitting: Internal Medicine

## 2018-03-02 ENCOUNTER — Ambulatory Visit (AMBULATORY_SURGERY_CENTER): Payer: 59 | Admitting: Gastroenterology

## 2018-03-02 ENCOUNTER — Encounter: Payer: Self-pay | Admitting: Gastroenterology

## 2018-03-02 VITALS — BP 118/72 | HR 61 | Temp 97.5°F | Resp 11 | Ht 62.0 in | Wt 236.0 lb

## 2018-03-02 DIAGNOSIS — Z1211 Encounter for screening for malignant neoplasm of colon: Secondary | ICD-10-CM

## 2018-03-02 DIAGNOSIS — D120 Benign neoplasm of cecum: Secondary | ICD-10-CM

## 2018-03-02 LAB — HM COLONOSCOPY

## 2018-03-02 MED ORDER — SODIUM CHLORIDE 0.9 % IV SOLN: 500.0000 mL | Freq: Once | INTRAVENOUS | Status: DC

## 2018-03-02 NOTE — Progress Notes (Signed)
Called to room to assist during endoscopic procedure.  Patient ID and intended procedure confirmed with present staff. Received instructions for my participation in the procedure from the performing physician.  

## 2018-03-02 NOTE — Progress Notes (Signed)
A and O x3. Report to RN. Tolerated MAC anesthesia well.

## 2018-03-02 NOTE — Patient Instructions (Signed)
Handouts provided:  Polyps and High Fiber diet  YOU HAD AN ENDOSCOPIC PROCEDURE TODAY AT [facility]:   Refer to the procedure report that was given to you for any specific questions about what was found during the examination.  If the procedure report does not answer your questions, please call your gastroenterologist to clarify.  If you requested that your care partner not be given the details of your procedure findings, then the procedure report has been included in a sealed envelope for you to review at your convenience later.  YOU SHOULD EXPECT: Some feelings of bloating in the abdomen. Passage of more gas than usual.  Walking can help get rid of the air that was put into your GI tract during the procedure and reduce the bloating. If you had a lower endoscopy (such as a colonoscopy or flexible sigmoidoscopy) you may notice spotting of blood in your stool or on the toilet paper. If you underwent a bowel prep for your procedure, you may not have a normal bowel movement for a few days.  Please Note:  You might notice some irritation and congestion in your nose or some drainage.  This is from the oxygen used during your procedure.  There is no need for concern and it should clear up in a day or so.  SYMPTOMS TO REPORT IMMEDIATELY:   Following lower endoscopy (colonoscopy or flexible sigmoidoscopy):  Excessive amounts of blood in the stool  Significant tenderness or worsening of abdominal pains  Swelling of the abdomen that is new, acute  Fever of 100F or higher  For urgent or emergent issues, a gastroenterologist can be reached at any hour by calling [phone].   DIET:  We do recommend a small meal at first, but then you may proceed to your regular diet.  Drink plenty of fluids but you should avoid alcoholic beverages for 24 hours.  ACTIVITY:  You should plan to take it easy for the rest of today and you should NOT DRIVE or use heavy machinery until tomorrow (because of  the sedation medicines used during the test).    FOLLOW UP: Our staff will call the number listed on your records the next business day following your procedure to check on you and address any questions or concerns that you may have regarding the information given to you following your procedure. If we do not reach you, we will leave a message.  However, if you are feeling well and you are not experiencing any problems, there is no need to return our call.  We will assume that you have returned to your regular daily activities without incident.  If any biopsies were taken you will be contacted by phone or by letter within the next 1-3 weeks.  Please call us at [phone] if you have not heard about the biopsies in 3 weeks.    SIGNATURES/CONFIDENTIALITY: You and/or your care partner have signed paperwork which will be entered into your electronic medical record.  These signatures attest to the fact that that the information above on your After Visit Summary has been reviewed and is understood.  Full responsibility of the confidentiality of this discharge information lies with you and/or your care-partner.

## 2018-03-02 NOTE — Op Note (Addendum)
[place] Patient Name: [name] Procedure Date: 03/02/2018 1:36 PM MRN: [id number] Endoscopist: Justice Britain , MD Age: 52 Referring MD:  Date of Birth: February 20, 1966 Gender: Female Account #: [id number] Procedure:                Colonoscopy Indications:              Screening for colorectal malignant neoplasm Medicines:                Monitored Anesthesia Care Procedure:                Pre-Anesthesia Assessment:                           - Prior to the procedure, a History and Physical                            was performed, and patient medications and                            allergies were reviewed. The patient's tolerance of                            previous anesthesia was also reviewed. The risks                            and benefits of the procedure and the sedation                            options and risks were discussed with the patient.                            All questions were answered, and informed consent                            was obtained. Prior Anticoagulants: The patient has                            taken no previous anticoagulant or antiplatelet                            agents. ASA Grade Assessment: III - A patient with                            severe systemic disease. After reviewing the risks                            and benefits, the patient was deemed in                            satisfactory condition to undergo the procedure.                           After obtaining informed consent, the colonoscope  was passed under direct vision. Throughout the                            procedure, the patient's blood pressure, pulse, and                            oxygen saturations were monitored continuously. The                            Colonoscope was introduced through the anus and                            advanced to the 5 cm into the ileum. The                            colonoscopy was  performed without difficulty. The                            patient tolerated the procedure. The quality of the                            bowel preparation was evaluated using the BBPS                            [facility] Bowel Preparation Scale) with scores of:                            Right Colon = 3, Transverse Colon = 3 and Left                            Colon = 3 (entire mucosa seen well with no residual                            staining, small fragments of stool or opaque                            liquid). The total BBPS score equals 9. Scope In: 1:39:25 PM Scope Out: 1:53:16 PM Scope Withdrawal Time: 0 hours 9 minutes 2 seconds  Total Procedure Duration: 0 hours 13 minutes 51 seconds  Findings:                 The digital rectal exam findings include                            non-thrombosed external hemorrhoids. Pertinent                            negatives include no palpable rectal lesions.                           The terminal ileum and ileocecal valve appeared                            normal.  Two sessile polyps were found in the cecum. The                            polyps were 2 to 4 mm in size. These polyps were                            removed with a cold snare. Resection and retrieval                            were complete.                           Normal mucosa was found in the entire colon                            otherwise.                           Non-bleeding non-thrombosed internal hemorrhoids                            were found during retroflexion, during perianal                            exam and during digital exam. The hemorrhoids were                            Grade II (internal hemorrhoids that prolapse but                            reduce spontaneously). Complications:            No immediate complications. Estimated Blood Loss:     Estimated blood loss was minimal. Impression:               - Non-thrombosed  external hemorrhoids found on                            digital rectal exam.                           - The examined portion of the ileum was normal.                           - Two 2 to 4 mm polyps in the cecum, removed with a                            cold snare. Resected and retrieved.                           - Normal mucosa in the entire examined colon                            otherwise.                           -  Non-bleeding non-thrombosed internal hemorrhoids. Recommendation:           - The patient will be observed post-procedure,                            until all discharge criteria are met.                           - Discharge patient to home.                           - Patient has a contact number available for                            emergencies. The signs and symptoms of potential                            delayed complications were discussed with the                            patient. Return to normal activities tomorrow.                            Written discharge instructions were provided to the                            patient.                           - High fiber diet.                           - Use fiber, for example Citrucel, Fibercon, Konsyl                            or Metamucil 1-2 times daily.                           - Continue present medications.                           - Await pathology results.                           - Repeat colonoscopy in 5 years for surveillance                            based on pathology results.                           - The findings and recommendations were discussed                            with the patient.                           - The findings and recommendations were discussed  with the patient's family. Justice Britain, MD 03/02/2018 1:58:52 PM

## 2018-03-02 NOTE — Progress Notes (Signed)
Pt's states no medical or surgical changes since previsit or office visit. 

## 2018-03-03 ENCOUNTER — Telehealth: Payer: Self-pay

## 2018-03-03 ENCOUNTER — Telehealth: Payer: Self-pay | Admitting: *Deleted

## 2018-03-03 NOTE — Telephone Encounter (Signed)
First attempt, left VM.  

## 2018-03-03 NOTE — Telephone Encounter (Signed)
Second ost procedure follow up call, no answer

## 2018-03-04 ENCOUNTER — Ambulatory Visit: Payer: Self-pay | Admitting: Internal Medicine

## 2018-03-09 ENCOUNTER — Encounter: Payer: Self-pay | Admitting: Gastroenterology

## 2018-03-23 ENCOUNTER — Encounter: Payer: Self-pay | Admitting: Internal Medicine

## 2018-03-23 DIAGNOSIS — E1165 Type 2 diabetes mellitus with hyperglycemia: Secondary | ICD-10-CM

## 2018-03-23 DIAGNOSIS — I10 Essential (primary) hypertension: Secondary | ICD-10-CM

## 2018-03-23 DIAGNOSIS — Z0184 Encounter for antibody response examination: Secondary | ICD-10-CM

## 2018-03-23 NOTE — Addendum Note (Signed)
Addended by: Aviva Signs M on: 03/23/2018 04:40 PM   Modules accepted: Orders

## 2018-03-28 MED ORDER — TRIAMTERENE-HCTZ 37.5-25 MG PO CAPS: 1.0000 | ORAL_CAPSULE | Freq: Every day | ORAL | 1 refills | Status: DC

## 2018-03-28 MED FILL — TRIAMTERENE/HCTZ 37.5/25 CP: 37.5-25 | 90 days supply | Qty: 90 | Fill #0

## 2018-03-28 NOTE — Addendum Note (Signed)
Addended by: Aviva Signs M on: 03/28/2018 01:10 PM   Modules accepted: Orders

## 2018-04-26 ENCOUNTER — Other Ambulatory Visit (INDEPENDENT_AMBULATORY_CARE_PROVIDER_SITE_OTHER): Payer: 59

## 2018-04-26 ENCOUNTER — Encounter: Payer: Self-pay | Admitting: Family

## 2018-04-26 ENCOUNTER — Ambulatory Visit (INDEPENDENT_AMBULATORY_CARE_PROVIDER_SITE_OTHER): Payer: 59 | Admitting: Family

## 2018-04-26 VITALS — BP 114/82 | HR 77 | Temp 98.1°F | Ht 62.0 in | Wt 242.1 lb

## 2018-04-26 DIAGNOSIS — R109 Unspecified abdominal pain: Secondary | ICD-10-CM

## 2018-04-26 DIAGNOSIS — R232 Flushing: Secondary | ICD-10-CM

## 2018-04-26 DIAGNOSIS — E559 Vitamin D deficiency, unspecified: Secondary | ICD-10-CM | POA: Diagnosis not present

## 2018-04-26 LAB — URINALYSIS
Bilirubin Urine: NEGATIVE
Hgb urine dipstick: NEGATIVE
KETONES UR: NEGATIVE
LEUKOCYTES UA: NEGATIVE
Nitrite: NEGATIVE
PH: 7.5 (ref 5.0–8.0)
SPECIFIC GRAVITY, URINE: 1.015 (ref 1.000–1.030)
Total Protein, Urine: NEGATIVE
Urine Glucose: NEGATIVE
Urobilinogen, UA: 1 (ref 0.0–1.0)

## 2018-04-26 LAB — LUTEINIZING HORMONE: LH: 40.04 m[IU]/mL

## 2018-04-26 LAB — COMPREHENSIVE METABOLIC PANEL
ALT: 24 U/L (ref 0–35)
AST: 23 U/L (ref 0–37)
Albumin: 4 g/dL (ref 3.5–5.2)
Alkaline Phosphatase: 353 U/L — ABNORMAL HIGH (ref 39–117)
BILIRUBIN TOTAL: 0.3 mg/dL (ref 0.2–1.2)
BUN: 22 mg/dL (ref 6–23)
CALCIUM: 9.8 mg/dL (ref 8.4–10.5)
CO2: 28 meq/L (ref 19–32)
Chloride: 103 mEq/L (ref 96–112)
Creatinine, Ser: 1.04 mg/dL (ref 0.40–1.20)
GFR: 71.79 mL/min (ref >60.00)
GLUCOSE: 85 mg/dL (ref 70–99)
Potassium: 4.3 mEq/L (ref 3.5–5.1)
Sodium: 139 mEq/L (ref 135–145)
Total Protein: 7.8 g/dL (ref 6.0–8.3)

## 2018-04-26 LAB — CBC WITH DIFFERENTIAL/PLATELET
BASOS PCT: 0.5 % (ref 0.0–3.0)
Basophils Absolute: 0 10*3/uL (ref 0.0–0.1)
EOS PCT: 3.9 % (ref 0.0–5.0)
Eosinophils Absolute: 0.3 10*3/uL (ref 0.0–0.7)
HCT: 38.2 % (ref 36.0–46.0)
HEMOGLOBIN: 12.8 g/dL (ref 12.0–15.0)
LYMPHS PCT: 35 % (ref 12.0–46.0)
Lymphs Abs: 2.3 10*3/uL (ref 0.7–4.0)
MCHC: 33.5 g/dL (ref 30.0–36.0)
MCV: 82.6 fl (ref 78.0–100.0)
Monocytes Absolute: 0.5 10*3/uL (ref 0.1–1.0)
Monocytes Relative: 7.6 % (ref 3.0–12.0)
Neutro Abs: 3.5 10*3/uL (ref 1.4–7.7)
Neutrophils Relative %: 53 % (ref 43.0–77.0)
Platelets: 256 10*3/uL (ref 150.0–400.0)
RBC: 4.62 Mil/uL (ref 3.87–5.11)
RDW: 15.1 % (ref 11.5–15.5)
WBC: 6.7 10*3/uL (ref 4.0–10.5)

## 2018-04-26 LAB — TSH: TSH: 2.34 u[IU]/mL (ref 0.35–4.50)

## 2018-04-26 LAB — VITAMIN D 25 HYDROXY (VIT D DEFICIENCY, FRACTURES): VITD: 43.16 ng/mL (ref 30.00–100.00)

## 2018-04-26 LAB — FOLLICLE STIMULATING HORMONE: FSH: 53.9 m[IU]/mL

## 2018-04-26 LAB — HEMOGLOBIN A1C: Hgb A1c MFr Bld: 6.2 % (ref 4.6–6.5)

## 2018-04-26 NOTE — Progress Notes (Signed)
[name] is a 53 y.o. female with the following history as recorded in EpicCare:  Patient Active Problem List   Diagnosis Date Noted  . Vitamin D deficiency disease 12/17/2017  . S/P laparoscopic sleeve gastrectomy 12/16/2017  . Lipoma of back 12/16/2017  . Stress fracture 06/10/2017  . Chronic venous insufficiency 05/10/2017  . Venous stasis syndrome 10/27/2016  . Snoring 12/05/2015  . Arthritis 01/09/2015  . Type 2 diabetes mellitus with hyperglycemia, without long-term current use of insulin ([place]) 09/13/2014  . Hyperlipidemia with target LDL less than 130 09/13/2014  . Dermatitis, asteototic 09/05/2014  . Alkaline phosphatase elevation 04/25/2013  . Carpal tunnel syndrome 12/30/2012  . Other screening mammogram 10/10/2012  . Migraine 08/07/2011  . Severe obesity (BMI >= 40) ([place]) 12/25/2010  . Routine general medical examination at a health care facility 09/16/2010  . Essential hypertension, benign 02/27/2009  . Osteoarthritis 02/27/2009    Current Outpatient Medications  Medication Sig Dispense Refill  . calcium carbonate (TUMS EX) 750 MG chewable tablet Chew 1,500 mg by mouth daily.    . Cholecalciferol 50000 units capsule Take 1 capsule (50,000 Units total) by mouth once a week. 12 capsule 1  . ibuprofen (ADVIL,MOTRIN) 200 MG tablet Take 400-800 mg by mouth every 6 (six) hours as needed for headache or moderate pain.    . Multiple Vitamin (MULTIVITAMIN) tablet Take 1 tablet by mouth daily.    . naproxen (NAPROSYN) 375 MG tablet Take 375 mg by mouth 2 (two) times daily with a meal.    . nebivolol (BYSTOLIC) 5 MG tablet Take 1 tablet (5 mg total) daily by mouth. 90 tablet 0  . triamterene-hydrochlorothiazide (DYAZIDE) 37.5-25 MG capsule Take 1 each (1 capsule total) by mouth daily. 90 capsule 1   No current facility-administered medications for this visit.     Allergies: Patient has no known allergies.  Past Medical History:  Diagnosis Date  . Clotting disorder  ([place])   . Diabetes mellitus without complication ([place])   . Edema 04/25/2015  . Headache(784.0)   . Herniated disc   . History of colonic polyps   . Hypertension   . Obesity   . Osteoarthritis   . Shortness of breath 04/25/2015  . TIA (transient ischemic attack) 2007   hx of  . Vasculitis ([facility])    L leg - Polyarthritis  . Venous insufficiency     Past Surgical History:  Procedure Laterality Date  . ABDOMINAL HYSTERECTOMY    . LAPAROSCOPIC GASTRIC SLEEVE RESECTION N/A 08/11/2016   Procedure: LAPAROSCOPIC GASTRIC SLEEVE RESECTION, UPPER ENDOSCOPY;  Surgeon: Arta Bruce Kinsinger, MD;  Location: [facility]S;  Service: General;  Laterality: N/A;    Family History  Problem Relation Age of Onset  . Stroke Mother   . Hypertension Mother   . AAA (abdominal aortic aneurysm) Mother   . Hypertension Father   . Cancer Father        Prostate  . Arthritis Other   . Hypertension Other   . Stroke Other   . Colon cancer Neg Hx   . Esophageal cancer Neg Hx   . Rectal cancer Neg Hx   . Stomach cancer Neg Hx     Social History   Tobacco Use  . Smoking status: Never Smoker  . Smokeless tobacco: Never Used  Substance Use Topics  . Alcohol use: Yes    Alcohol/week: 0.0 standard drinks    Comment: rarely    Subjective:  Patient presents with concerns for 1 day  history of abdominal pain/ headache; "just doesn't feel well today." Did have some diarrhea earlier today; notes that husband has viral illness as well;   Has been having increased symptoms of hot flashes for the past month; had hysterectomy in 2005 but ovaries are in place; notes that she has the hot flashes every day 3-4 x per day-notes that she breaks out in sweats and then will get cold; symptoms do occur at night; scheduled to see her GYN later this month.    Objective:  Vitals:   04/26/18 1048  BP: 114/82  Pulse: 77  Temp: 98.1 F (36.7 C)  TempSrc: Oral  SpO2: 99%  Weight: 242 lb 1.3 oz (109.8 kg)  Height: '5\' 2"'$  (9.030 m)     General: Well developed, well nourished, in no acute distress  Skin : Warm and dry.  Head: Normocephalic and atraumatic  Eyes: Sclera and conjunctiva clear; pupils round and reactive to light; extraocular movements intact  Ears: External normal; canals clear; tympanic membranes normal  Oropharynx: Pink, supple. No suspicious lesions  Neck: Supple without thyromegaly, adenopathy  Lungs: Respirations unlabored; clear to auscultation bilaterally without wheeze, rales, rhonchi  CVS exam: normal rate and regular rhythm.  Abdomen: Soft; nontender; nondistended; normoactive bowel sounds; no masses or hepatosplenomegaly  Neurologic: Alert and oriented; speech intact; face symmetrical; moves all extremities well; CNII-XII intact without focal deficit   Assessment:  1. Hot flashes   2. Abdominal pain, unspecified abdominal location   3. Vitamin D deficiency     Plan:  1. Suspect related to menopause; will update labs to rule out underlying infection however; follow-up to be determined. 2. Suspect acute symptoms of past 24 hours are viral illness- per patient, some improvement noted today; BRAT diet discussed, need for hydration; 3. Update Vitamin D- will determine need for continued Rx;   No follow-ups on file.  Orders Placed This Encounter  Procedures  . Urine Culture    Standing Status:   Future    Number of Occurrences:   1    Standing Expiration Date:   04/26/2019  . CBC w/Diff    Standing Status:   Future    Number of Occurrences:   1    Standing Expiration Date:   04/26/2019  . Comp Met (CMET)    Standing Status:   Future    Number of Occurrences:   1    Standing Expiration Date:   04/26/2019  . TSH    Standing Status:   Future    Number of Occurrences:   1    Standing Expiration Date:   04/26/2019  . [place]    Standing Status:   Future    Number of Occurrences:   1    Standing Expiration Date:   04/26/2019  . LH    Standing Status:   Future    Number of Occurrences:   1     Standing Expiration Date:   04/26/2019  . Urinalysis    Standing Status:   Future    Number of Occurrences:   1    Standing Expiration Date:   04/26/2019  . HgB A1c    Standing Status:   Future    Number of Occurrences:   1    Standing Expiration Date:   04/26/2019  . Vitamin D (25 hydroxy)    Standing Status:   Future    Number of Occurrences:   1    Standing Expiration Date:   04/26/2019  Requested Prescriptions    No prescriptions requested or ordered in this encounter

## 2018-04-27 LAB — URINE CULTURE
MICRO NUMBER:: 52767
SPECIMEN QUALITY:: ADEQUATE

## 2018-04-29 ENCOUNTER — Other Ambulatory Visit: Payer: Self-pay | Admitting: Family

## 2018-04-29 ENCOUNTER — Ambulatory Visit: Payer: Self-pay | Admitting: Registered"

## 2018-04-29 DIAGNOSIS — R899 Unspecified abnormal finding in specimens from other organs, systems and tissues: Secondary | ICD-10-CM

## 2018-05-06 ENCOUNTER — Encounter: Payer: Self-pay | Admitting: Internal Medicine

## 2018-05-06 ENCOUNTER — Other Ambulatory Visit: Payer: Self-pay | Admitting: Family

## 2018-05-06 ENCOUNTER — Ambulatory Visit: Payer: Self-pay | Admitting: *Deleted

## 2018-05-06 MED ORDER — OSELTAMIVIR PHOSPHATE 75 MG PO CAPS: 75.0000 mg | ORAL_CAPSULE | Freq: Two times a day (BID) | ORAL | 0 refills | Status: AC

## 2018-05-06 MED FILL — OSELTAMIVIR PHOSPHATE 75 MG: 75 | 5 days supply | Qty: 10 | Fill #0

## 2018-05-06 NOTE — Telephone Encounter (Signed)
Summary: exposed to flu    Pt called and stated that she has been exposed to the flu and would like to know what she should do. Please advise          Patient states her husband is having more symptoms than she is. Patient is not having symptoms besides sneezing and hot flashes-she is menopausal.Patient is willing to wait it out since her symptoms are minimal- but call will be forwarded for PCP review. Reason for Disposition . 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 TYPE of EXPOSURE: "How were you exposed?" (e.g., close contact, not a close contact)     Brother in law- in car and and movies- close contact 2. DATE of EXPOSURE: "When did the exposure occur?" (e.g., hour, days, weeks)     Diagnosis was Monday- they were exposed Saturday 3. PREGNANCY: "Is there any chance you are pregnant?" "When was your last menstrual period?"     n/a 4. HIGH RISK for COMPLICATIONS: "Do you have any heart or lung problems? Do you have a weakened immune system?" (e.g., CHF, COPD, asthma, HIV positive, chemotherapy, renal failure, diabetes mellitus, sickle cell anemia)     no 5. SYMPTOMS: "Do you have any symptoms?" (e.g., cough, fever, sore throat, difficulty breathing).     Patient has been sneezing and has hot flashes(patient menopausal)- patient had vaccine  Protocols used: INFLUENZA EXPOSURE-A-AH

## 2018-05-13 ENCOUNTER — Encounter: Payer: 59 | Attending: Internal Medicine | Admitting: Registered"

## 2018-05-13 ENCOUNTER — Telehealth: Payer: Self-pay | Admitting: Internal Medicine

## 2018-05-13 ENCOUNTER — Encounter: Payer: Self-pay | Admitting: Registered"

## 2018-05-13 DIAGNOSIS — E1165 Type 2 diabetes mellitus with hyperglycemia: Secondary | ICD-10-CM | POA: Diagnosis present

## 2018-05-13 LAB — HM MAMMOGRAPHY

## 2018-05-13 MED ORDER — ONETOUCH VERIO W/DEVICE KIT: PACK | 0 refills | Status: DC

## 2018-05-13 MED ORDER — ONETOUCH ULTRASOFT LANCETS MISC: 2 refills | Status: DC

## 2018-05-13 MED ORDER — GLUCOSE BLOOD VI STRP: ORAL_STRIP | 2 refills | Status: DC

## 2018-05-13 MED FILL — ONETOUCH DELICA PLUS LANCET: 90 days supply | Qty: 100 | Fill #0

## 2018-05-13 MED FILL — ONETOUCH VERIO TEST STRIP: 90 days supply | Qty: 100 | Fill #0

## 2018-05-13 NOTE — Patient Instructions (Signed)
Great job on making changes and sticking with them since October, no soda, more exercise is doing your body wonders! Continue to focus on getting protein with each meal. A protein shake for breakfast would be fine. Continue exercising as tolerated, consider adding a few minutes of flexibility and stretching. Consider checking your blood sugar a few times a week and take the log book to your next endocrinologist appointment.

## 2018-05-13 NOTE — Progress Notes (Signed)
Diabetes Self-Management Education  Visit Type: First/Initial  Appt. Start Time: 1105 Appt. End Time: 7741  05/13/2018  [name], identified by name and date of birth, is a 53 y.o. female with a diagnosis of Diabetes: Type 2.   ASSESSMENT  Height 5\' 2"  (1.575 m), weight 240 lb 9.6 oz (109.1 kg). Body mass index is 44.01 kg/m.   Patient states she thought she was here for post bariatric surgery counseling and made a future appointment with bariatric dietitian but also decided to proceed with today's visit focused on diabetes education. Pt states she is 2 yrs s/p bariatric surgery in May.  Patient states she was unaware her A1c had changed from 5.6% to 6.2%. Patient states she has not seen her endocrinologist in a while and plans to stop by their office today and request a prescription for a new meter.  Pt reports getting married in October and making some significant changes at that time including stopped drinking soda and increased exercise from just walking to going to the gym 5x week with husband 60-90 min, aerobic and strength training. Pt states she may also start going in a.m. before work to work with a Clinical research associate, but it may be too early to allow time to get ready for work.  Pt states she still follows eating stratigies of protein first, vegetable, carb and waiting 30 minutes before drinking.  Per dietary recall patient may be getting ~60 g pro/day. Pt states she doesn't like straight water since surgery, uses drop-ins. Patient states she likes fish but doesn't eat it that often.  Pt states she gets a headache if she eats more than 2 pieces of bacon, also noticed with pork chop (not salty), but no other foods cause symptoms.  Pt states her stress level has greatly reduced with change in employment. Pt report sleep is fine except when having hot flashes.  Diabetes Self-Management Education - 05/13/18 1117      Visit Information   Visit Type  First/Initial      Initial  Visit   Diabetes Type  Type 2    Are you currently following a meal plan?  No    Are you taking your medications as prescribed?  Not on Medications    Date Diagnosed  2018      Health Coping   How would you rate your overall health?  Good      Psychosocial Assessment   Patient Belief/Attitude about Diabetes  Motivated to manage diabetes    How often do you need to have someone help you when you read instructions, pamphlets, or other written materials from your doctor or pharmacy?  1 - Never    What is the last grade level you completed in school?  some college      Complications   Last HgB A1C per patient/outside source  6.2 %    How often do you check your blood sugar?  0 times/day (not testing)    Number of hypoglycemic episodes per month  0    Have you had a dilated eye exam in the past 12 months?  No    Have you had a dental exam in the past 12 months?  No    Are you checking your feet?  No      Dietary Intake   Breakfast  1/2 pancake, 2 slices bacon (14)    Snack (morning)  none    Lunch  salad, baked chicken (30 g), a little rice (20 g)  Snack (afternoon)  none    Dinner  salad, vegetables, [place] (15 g) (no carb last night)    Snack (evening)  none    Beverage(s)  water with drop-in, rarely sweet tea      Exercise   Exercise Type  Moderate (swimming / aerobic walking)    How many days per week to you exercise?  5    How many minutes per day do you exercise?  75    Total minutes per week of exercise  375      Patient Education   Previous Diabetes Education  Yes (please comment)   NDES 2018   Nutrition management   Role of diet in the treatment of diabetes and the relationship between the three main macronutrients and blood glucose level    Physical activity and exercise   Role of exercise on diabetes management, blood pressure control and cardiac health.    Monitoring  Identified appropriate SMBG and/or A1C goals.      Individualized Goals (developed by patient)    Nutrition  General guidelines for healthy choices and portions discussed    Physical Activity  Exercise 3-5 times per week    Monitoring   test my blood glucose as discussed      Outcomes   Expected Outcomes  Demonstrated interest in learning. Expect positive outcomes    Future DMSE  PRN    Program Status  Completed      Individualized Plan for Diabetes Self-Management Training:   Learning Objective:  Patient will have a greater understanding of diabetes self-management. Patient education plan is to attend individual and/or group sessions per assessed needs and concerns.  Patient Instructions  Doristine Devoid job on making changes and sticking with them since October, no soda, more exercise is doing your body wonders! Continue to focus on getting protein with each meal. A protein shake for breakfast would be fine. Continue exercising as tolerated, consider adding a few minutes of flexibility and stretching. Consider checking your blood sugar a few times a week and take the log book to your next endocrinologist appointment.    Expected Outcomes:  Demonstrated interest in learning. Expect positive outcomes  Education material provided: ADA Diabetes: Your Take Control Guide and A1C conversion sheet, A1c chart, FDA fish chart  If problems or questions, patient to contact team via:  Phone and MyChart  Future DSME appointment: PRN

## 2018-05-13 NOTE — Telephone Encounter (Signed)
Orders sent

## 2018-05-13 NOTE — Telephone Encounter (Signed)
MEDICATION: a NEW Meter, strips AND Lancets  PHARMACY:  [facility]  IS THIS A 90 DAY SUPPLY : Not sure  IS PATIENT OUT OF MEDICATION: Yes  IF NOT; HOW MUCH IS LEFT: None  LAST APPOINTMENT DATE: @11 /16/2018  NEXT APPOINTMENT DATE:@5 /15/2020  DO WE HAVE YOUR PERMISSION TO LEAVE A DETAILED MESSAGE: Yes  OTHER COMMENTS: Ph# [phone]   **Let patient know to contact pharmacy at the end of the day to make sure medication is ready. **  ** Please notify patient to allow 48-72 hours to process**  **Encourage patient to contact the pharmacy for refills or they can request refills through [facility]**

## 2018-05-16 ENCOUNTER — Telehealth: Payer: Self-pay | Admitting: Internal Medicine

## 2018-05-16 NOTE — Telephone Encounter (Signed)
Paitent stated due to her insurance changing that they will only cover One Touch. So the patient is needing new prescriptions sent in for a new ONE TOUCH meter, strips and lancets.     [place], [place]

## 2018-05-16 NOTE — Telephone Encounter (Signed)
On 05/13/18 we sent all of these.  Patient notified.

## 2018-05-18 ENCOUNTER — Encounter: Payer: Self-pay | Admitting: Internal Medicine

## 2018-05-18 MED FILL — ONETOUCH VERIO TEST STRIP: 90 days supply | Qty: 100 | Fill #0

## 2018-05-24 MED FILL — ONETOUCH DELICA PLUS LANCET: 90 days supply | Qty: 100 | Fill #0

## 2018-05-24 MED FILL — ONETOUCH VERIO METER SYSTEM: W/DEVICE | 20 days supply | Qty: 1 | Fill #0

## 2018-06-02 ENCOUNTER — Encounter: Payer: 59 | Attending: Internal Medicine | Admitting: Skilled Nursing Facility1

## 2018-06-02 ENCOUNTER — Encounter: Payer: Self-pay | Admitting: Internal Medicine

## 2018-06-02 DIAGNOSIS — E669 Obesity, unspecified: Secondary | ICD-10-CM

## 2018-06-02 DIAGNOSIS — E1165 Type 2 diabetes mellitus with hyperglycemia: Secondary | ICD-10-CM | POA: Diagnosis present

## 2018-06-02 NOTE — Patient Instructions (Addendum)
-  Do not bring any change to work; work on not snacking often at work   -Try fruit, basil, cinnamon stick, lemon or lime in your water Or sugar free natures twist or unsweet tea or decaff coffee or sugar free hot coco or sugar free apple cider or 5 calorie juice   -Peaches no packed in syrup   -Choose whole wheat for your bread  -Your goal for the gym is to get sweaty and out of breath  -drink 64 fluid ounces every day   -Take your multivitamin and calcium every day 2 hours apart for everything   -Have non-starchy vegetables 2 times a day 7 days a week   -Try kefir in the yogurt aisle (4-8 ounces every morning)

## 2018-06-02 NOTE — Progress Notes (Signed)
  Sleeve Gastrectomy Surgery  Medical Nutrition Therapy:  Appt start time: 4:50 end time:  5:35  Primary concerns today: Post-operative Bariatric Surgery Nutrition Management.  Non scale victories: able to go up stairs better, A1c 5.5  Surgery date: 08/11/2016 Surgery type: sleeve gastrectomy Start weight at [facility]: 292.3 lbs Weight today: 244.6 pounds   TANITA  BODY COMP RESULTS  10/06/2016 12/24/2016 04/20/2017 06/02/2018   BMI (kg/m^2) 46.2 42.1 39.4 44.7   Fat Mass (lbs) 123.4 98.2 85.6 121   Fat Free Mass (lbs) 121.0 124.4 123.0 123.6  Total Body Water (lbs) 88.6 90.2 88.6 90.2   24-hr recall: snacking on candy throughout the work day and once home in the evening  B (AM): sometimes skips; scoop of egg and [place] bacon (21g)  Snk (AM): peanut butter crackers (4g) or greek yogurt (15g)-sometimes  L (PM): chicken strips (6g) or tomato basil soup or chicken salad (14g) Snk (PM): doritoes  D (PM): salad with ham (14g)  Snk (PM): none  Fluid intake: water (sometimes with flavors), rarely unsweet tea, coffee: 3-4 20 ounce bottles Estimated total protein intake: about 73 g protein daily   Medications: See list Supplementation:  3 calcium + biotin  Using straws: no Drinking while eating: no Having you been chewing well: yes Chewing/swallowing difficulties: no Changes in vision: no Changes to mood/headaches: no Hair loss/Changes to skin/Changes to nails: no Any difficulty focusing or concentrating: no Sweating: no Dizziness/Lightheaded:  no Palpitations: no  Carbonated beverages: no N/V/D/C/GAS: maybe 1 bowel movement a week  Abdominal Pain: no Dumping syndrome: no  Recent physical activity:  5 days a week 6:30-8 weights and cardio   Progress Towards Goal(s):  In progress.  Handouts given during visit include:  Snack ideas for bariatric patients   Nutritional Diagnosis:  [place]-3.3 Overweight/obesity related to past poor dietary habits and physical inactivity as  evidenced by patient w/ recent sleeve gastrectomy surgery following dietary guidelines for continued weight loss.     Intervention:  Nutrition education and counseling.   Goals: -Do not bring any change to work; work on not snacking often at work  -Try fruit, basil, cinnamon stick, lemon or lime in your water Or sugar free natures twist or unsweet tea or decaff coffee or sugar free hot coco or sugar free apple cider or 5 calorie juice  -Peaches no packed in syrup  -Choose whole wheat for your bread -Your goal for the gym is to get sweaty and out of breath -drink 64 fluid ounces every day  -Take your multivitamin and calcium every day 2 hours apart for everything  -Have non-starchy vegetables 2 times a day 7 days a week  -Try kefir in the yogurt aisle (4-8 ounces every morning)  Teaching Method Utilized:  Visual Auditory Hands on  Barriers to learning/adherence to lifestyle change: none identified  Demonstrated degree of understanding via:  Teach Back   Monitoring/Evaluation:  Dietary intake, exercise, and body weight.  If problems or questions, patient to contact team via:  Phone and MyChart  Future DSME appointment:

## 2018-06-13 ENCOUNTER — Telehealth: Payer: Self-pay | Admitting: Cardiovascular Disease

## 2018-06-13 ENCOUNTER — Encounter: Payer: Self-pay | Admitting: Internal Medicine

## 2018-06-13 DIAGNOSIS — Z5181 Encounter for therapeutic drug level monitoring: Secondary | ICD-10-CM

## 2018-06-13 NOTE — Telephone Encounter (Signed)
New Message   Pt c/o swelling: STAT is pt has developed SOB within 24 hours  1) How much weight have you gained and in what time span? 2-5lbs overnight patient states she loses it during the day but it comes right back at night and it's been happening for a week or two.  2) If swelling, where is the swelling located? N/A  3) Are you currently taking a fluid pill? Yes   4) Are you currently SOB? No  5) Do you have a log of your daily weights (if so, list)? NO it goes between 231 and 244  6) Have you gained 3 pounds in a day or 5 pounds in a week? Yes   7) Have you traveled recently? No

## 2018-06-13 NOTE — Telephone Encounter (Signed)
Spoke to patient ,she states that she fluctuate 2- 5 lbs of weight gain between going to bed and next morning. She states leg do not swell but she feel the excess in her stomach/abd. She states weight 238 to 244 lbs.  She is  taking dyazide.  she could not give me actual weight from last 4 days she did not have them with her.  aware will defer to Dr Oval Linsey

## 2018-06-15 NOTE — Telephone Encounter (Signed)
Last echo was in 2017 and she didn't have heart failure at the time.  She also hasn't been seen in clinic since 2017.  It is hard to make recommendations without her being seen.  Recommend that she be seen by APP this week if possible.

## 2018-06-16 NOTE — Telephone Encounter (Signed)
Follow up ° ° ° °Pt is calling back  °Please return call  °

## 2018-06-16 NOTE — Telephone Encounter (Signed)
Left message to call back  

## 2018-06-17 NOTE — Telephone Encounter (Signed)
Pt has picked up samples and copay card

## 2018-06-29 MED FILL — TRIAMTERENE/HCTZ 37.5/25 CP: 37.5-25 | 90 days supply | Qty: 90 | Fill #1

## 2018-07-04 ENCOUNTER — Other Ambulatory Visit: Payer: Self-pay | Admitting: Family Medicine

## 2018-07-04 ENCOUNTER — Encounter: Payer: Self-pay | Admitting: Internal Medicine

## 2018-07-04 MED ORDER — NEBIVOLOL HCL 5 MG PO TABS: 5.0000 mg | ORAL_TABLET | Freq: Every day | ORAL | 0 refills | Status: DC

## 2018-07-06 NOTE — Telephone Encounter (Signed)
Spoke with patient and apologized that I had not reached out to her sooner, was unaware she had called back. Patient has had intermittent weight gain. One week weight is down and the next week weight will be up 2-3 pounds overnight. Patient denies any swelling in her lower extremities but stomach feels swollen. She denies any shortness of breath. She has noticed she is not urinating as often as she used to. Will forward to Dr Oval Linsey for review to see if virtual visit appropriate.

## 2018-07-06 NOTE — Telephone Encounter (Signed)
Try lasix 40mg  daily as needed.  Check BMP in 2 weeks.  This can be done at her work if they will allow it.

## 2018-07-06 NOTE — Telephone Encounter (Signed)
Left message to call back  

## 2018-07-07 ENCOUNTER — Encounter: Payer: Self-pay | Admitting: Cardiovascular Disease

## 2018-07-07 MED ORDER — FUROSEMIDE 40 MG PO TABS: ORAL_TABLET | ORAL | 0 refills | Status: DC

## 2018-07-07 NOTE — Telephone Encounter (Signed)
This encounter was created in error - please disregard.

## 2018-07-07 NOTE — Telephone Encounter (Signed)
Advised patient, verbalized understanding,    Glyn Ade at 07/07/2018 3:09 PM   Status: Signed    Follow Up:        Returning your call.

## 2018-07-07 NOTE — Telephone Encounter (Signed)
Follow Up:; ° ° °Returning your call. °

## 2018-07-08 MED FILL — FUROSEMIDE 40 MG TAB: 40 | 30 days supply | Qty: 30 | Fill #0

## 2018-07-08 MED FILL — BYSTOLIC 5 MG TABLET: 5 | 90 days supply | Qty: 90 | Fill #0

## 2018-07-14 ENCOUNTER — Ambulatory Visit: Payer: Self-pay | Admitting: Skilled Nursing Facility1

## 2018-08-11 ENCOUNTER — Telehealth: Payer: Self-pay | Admitting: Cardiovascular Disease

## 2018-08-11 DIAGNOSIS — R198 Other specified symptoms and signs involving the digestive system and abdomen: Secondary | ICD-10-CM

## 2018-08-11 DIAGNOSIS — R635 Abnormal weight gain: Secondary | ICD-10-CM

## 2018-08-11 DIAGNOSIS — R600 Localized edema: Secondary | ICD-10-CM

## 2018-08-11 NOTE — Telephone Encounter (Signed)
Weight up 3 pounds in 24 hours and she is concerned about this. She denies any shortness or swelling in legs or feet but feels like her abdomen bigger. She weighs herself daily . If she takes a Lasix her weight will go down about 1/2 pound in 24 hours. She did not get labs as suggested. Will forward to Dr Oval Linsey for review

## 2018-08-11 NOTE — Telephone Encounter (Signed)
Pt c/o swelling: STAT is pt has developed SOB within 24 hours  1) How much weight have you gained and in what time span? 3 pounds over night.  2) If swelling, where is the swelling located? No swelling she thinks it might be in her stomach.   3) Are you currently taking a fluid pill? Yes   4) Are you currently SOB? No  5) Do you have a log of your daily weights (if so, list)? No   6) Have you gained 3 pounds in a day or 5 pounds in a week? Yes   7) Have you traveled recently? No

## 2018-08-12 NOTE — Telephone Encounter (Signed)
She does not have heart failure that we know of.  Prior echo was normal.  Watch salt intake (keep under 2g) and fluid intake.  OK to take lasix as needed.  Echo when able (moderate priority) and please get labs as ordered.

## 2018-08-15 NOTE — Telephone Encounter (Signed)
Left message to call back  

## 2018-08-26 ENCOUNTER — Ambulatory Visit: Payer: Self-pay | Admitting: Internal Medicine

## 2018-08-31 NOTE — Addendum Note (Signed)
Addended by: Alvina Filbert B on: 08/31/2018 04:17 PM   Modules accepted: Orders

## 2018-08-31 NOTE — Telephone Encounter (Signed)
Advised patient, verbalized understanding  

## 2018-09-06 ENCOUNTER — Ambulatory Visit: Payer: Self-pay | Admitting: Skilled Nursing Facility1

## 2018-09-08 ENCOUNTER — Ambulatory Visit (INDEPENDENT_AMBULATORY_CARE_PROVIDER_SITE_OTHER): Payer: 59 | Admitting: Internal Medicine

## 2018-09-08 DIAGNOSIS — M5441 Lumbago with sciatica, right side: Secondary | ICD-10-CM

## 2018-09-08 DIAGNOSIS — M5442 Lumbago with sciatica, left side: Secondary | ICD-10-CM

## 2018-09-08 MED ORDER — CYCLOBENZAPRINE HCL 10 MG PO TABS: 10.0000 mg | ORAL_TABLET | Freq: Three times a day (TID) | ORAL | 0 refills | Status: DC | PRN

## 2018-09-08 MED ORDER — METHYLPREDNISOLONE 4 MG PO TBPK: ORAL_TABLET | ORAL | 0 refills | Status: AC

## 2018-09-08 MED FILL — CYCLOBENZAPRINE HCL 10 MG T: 10 | 15 days supply | Qty: 45 | Fill #0

## 2018-09-08 MED FILL — METHYLPREDNISOLONE 4 MG TBP: 4 | 6 days supply | Qty: 21 | Fill #0

## 2018-09-09 ENCOUNTER — Encounter: Payer: Self-pay | Admitting: Internal Medicine

## 2018-09-09 NOTE — Progress Notes (Signed)
Virtual Visit via Video Note  I connected with [name] on 09/09/18 at  1:30 PM EDT by a video enabled telemedicine application and verified that I am speaking with the correct person using two identifiers.   I discussed the limitations of evaluation and management by telemedicine and the availability of in person appointments. The patient expressed understanding and agreed to proceed.  History of Present Illness: She checked in for a virtual visit.  She was not willing to come in for an in person visit due to the COVID-19 pandemic.  She complains of a 2-day history of nontraumatic back pain that radiates into her lower extremities.  She describes the back pain as a burning sensation with spasms. The pain increases when she goes from a sitting or lying position to a standing position.  She has not gotten much symptom relief with Motrin 800 mg 2 or 3 times a day.  She has a history of a disc herniation.  She denies paresthesias in her lower extremities.    Observations/Objective: No distress noted. She was laying in the bed. She was calm, cooperative, and appropriate.  Lab Results  Component Value Date   WBC 6.7 04/26/2018   HGB 12.8 04/26/2018   HCT 38.2 04/26/2018   PLT 256.0 04/26/2018   GLUCOSE 85 04/26/2018   CHOL 179 12/16/2017   TRIG 75.0 12/16/2017   HDL 69.90 12/16/2017   LDLCALC 94 12/16/2017   ALT 24 04/26/2018   AST 23 04/26/2018   NA 139 04/26/2018   K 4.3 04/26/2018   CL 103 04/26/2018   CREATININE 1.04 04/26/2018   BUN 22 04/26/2018   CO2 28 04/26/2018   TSH 2.34 04/26/2018   INR 1.0 05/25/2007   HGBA1C 6.2 04/26/2018   MICROALBUR <0.7 05/10/2017     Assessment and Plan: I think she has re-herniated a lumbar disc but fortunately she does not have any signs or symptoms of myelopathy.  Since she is not getting much symptom relief with Motrin I have asked her to take a 6-day course of methylprednisolone.  I have asked her to be as active as possible without  experiencing much discomfort or disability.   Follow Up Instructions: She will let me know if she develops any new or worsening symptoms.  She agrees to improve her activity level.    I discussed the assessment and treatment plan with the patient. The patient was provided an opportunity to ask questions and all were answered. The patient agreed with the plan and demonstrated an understanding of the instructions.   The patient was advised to call back or seek an in-person evaluation if the symptoms worsen or if the condition fails to improve as anticipated.  I provided 20 minutes of non-face-to-face time during this encounter.   Scarlette Calico, MD

## 2018-09-12 ENCOUNTER — Telehealth: Payer: Self-pay | Admitting: Cardiovascular Disease

## 2018-09-12 NOTE — Telephone Encounter (Signed)
Message sent to MyChart.

## 2018-10-10 ENCOUNTER — Other Ambulatory Visit: Payer: Self-pay | Admitting: Internal Medicine

## 2018-10-10 ENCOUNTER — Other Ambulatory Visit: Payer: Self-pay | Admitting: Cardiovascular Disease

## 2018-10-10 DIAGNOSIS — I10 Essential (primary) hypertension: Secondary | ICD-10-CM

## 2018-10-10 NOTE — Telephone Encounter (Signed)
Rx(s) sent to pharmacy electronically.  

## 2018-10-11 MED FILL — FUROSEMIDE 40 MG TAB: 40 | 90 days supply | Qty: 90 | Fill #0

## 2018-10-12 ENCOUNTER — Encounter: Payer: 59 | Attending: Internal Medicine | Admitting: Skilled Nursing Facility1

## 2018-10-12 ENCOUNTER — Other Ambulatory Visit: Payer: Self-pay

## 2018-10-12 DIAGNOSIS — E669 Obesity, unspecified: Secondary | ICD-10-CM | POA: Diagnosis not present

## 2018-10-12 NOTE — Patient Instructions (Addendum)
-  3 days a week: walking video 30 minutes: starting today (10/12/2018)  -Limit yourself to 5 food events a day. 7 days a week  -You are fine to drink 3 32 ounce bottles  -Try kefir  -Get the appropriate multivitamin   -1-2 days a Week 1 serving of a candy or dessert

## 2018-10-12 NOTE — Progress Notes (Signed)
  Sleeve Gastrectomy Surgery  Medical Nutrition Therapy:  Appt start time: 4:50 end time:  5:35  Primary concerns today: Post-operative Bariatric Surgery Nutrition Management.  Non scale victories: able to go up stairs better, A1c 5.5  Pt states she has been gaining about 3-4 pounds overnight stating her cardiologist beleives this to be fluid so she started a fluid pill as needed.   Surgery date: 08/11/2016 Surgery type: sleeve gastrectomy Start weight at [facility]: 292.3 lbs Weight today: 253.1 pounds   Body Composition Scale 10/12/2018  Total Body Fat % 48.1  Visceral Fat 20  Fat-Free Mass % 51.8   Total Body Water % 40.4   Muscle-Mass lbs 28.6  Body Fat Displacement          Torso  lbs 75.5         Left Leg  lbs 15.1         Right Leg  lbs 15.1         Left Arm  lbs 7.5         Right Arm   lbs 7.5     24-hr recall: snacking on candy throughout the work day and once home in the evening  B (AM): wheat bread and bratworst Snk (AM): peanut butter crackers (4g) or greek yogurt (15g)-sometimes  L (PM): salad with lunch meat with fruit Snk (PM): doritoes or yogurt D (PM): veggies with chicken over brown rice  Snk (PM): none  Fluid intake: water (sometimes with flavors), rarely unsweet tea, coffee: 3-4 20 ounce bottles Estimated total protein intake: about 73 g protein daily   Medications: See list Supplementation:  3 calcium + biotin  Using straws: no Drinking while eating: no Having you been chewing well: yes Chewing/swallowing difficulties: no Changes in vision: no Changes to mood/headaches: no Hair loss/Changes to skin/Changes to nails: no Any difficulty focusing or concentrating: no Sweating: no Dizziness/Lightheaded:  no Palpitations: no  Carbonated beverages: no N/V/D/C/GAS: maybe 1 bowel movement a week  Abdominal Pain: no Dumping syndrome: no  Recent physical activity:  ADL's  Progress Towards Goal(s):  In progress.  Handouts given during visit  include:  Snack ideas for bariatric patients   Nutritional Diagnosis:  [place]-3.3 Overweight/obesity related to past poor dietary habits and physical inactivity as evidenced by patient w/ recent sleeve gastrectomy surgery following dietary guidelines for continued weight loss.     Intervention:  Nutrition education and counseling.   Goals: -3 days a week: walking video 30 minutes: starting today (10/12/2018) -Limit yourself to 5 food events a day. 7 days a week -You are fine to drink 3 32 ounce bottles -Try kefir -Get the appropriate multivitamin  -1-2 days a Week 1 serving of a candy or dessert   Teaching Method Utilized:  Visual Auditory Hands on  Barriers to learning/adherence to lifestyle change: none identified  Demonstrated degree of understanding via:  Teach Back   Monitoring/Evaluation:  Dietary intake, exercise, and body weight.  If problems or questions, patient to contact team via:  Phone and MyChart  Future DSME appointment:   6 weeks

## 2018-10-18 MED FILL — TRIAMTERENE/HCTZ 37.5/25 CP: 37.5-25 | 90 days supply | Qty: 90 | Fill #0

## 2018-10-24 ENCOUNTER — Other Ambulatory Visit (HOSPITAL_COMMUNITY): Payer: 59

## 2018-11-09 MED FILL — BYSTOLIC 5 MG TABLET: 5 | 90 days supply | Qty: 90 | Fill #0

## 2018-11-23 LAB — HM DIABETES EYE EXAM

## 2018-12-12 ENCOUNTER — Encounter: Payer: Self-pay | Admitting: Internal Medicine

## 2018-12-15 ENCOUNTER — Encounter

## 2018-12-15 ENCOUNTER — Ambulatory Visit: Payer: 59 | Admitting: Internal Medicine

## 2019-01-03 MED FILL — FUROSEMIDE 40 MG TAB: 40 | 90 days supply | Qty: 90 | Fill #0

## 2019-01-06 ENCOUNTER — Ambulatory Visit: Payer: 59 | Admitting: Family Medicine

## 2019-01-06 ENCOUNTER — Other Ambulatory Visit: Payer: Self-pay

## 2019-01-06 ENCOUNTER — Encounter: Payer: Self-pay | Admitting: Family

## 2019-01-06 ENCOUNTER — Encounter: Payer: Self-pay | Admitting: Internal Medicine

## 2019-01-06 ENCOUNTER — Ambulatory Visit (INDEPENDENT_AMBULATORY_CARE_PROVIDER_SITE_OTHER): Payer: 59 | Admitting: Family

## 2019-01-06 VITALS — BP 116/76 | HR 60 | Temp 97.8°F | Ht 61.0 in | Wt 257.0 lb

## 2019-01-06 DIAGNOSIS — H00014 Hordeolum externum left upper eyelid: Secondary | ICD-10-CM | POA: Diagnosis not present

## 2019-01-06 MED ORDER — TOBRAMYCIN 0.3 % OP SOLN: 1.0000 [drp] | Freq: Four times a day (QID) | OPHTHALMIC | 0 refills | Status: DC

## 2019-01-06 MED FILL — TOBRAMYCIN 0.3 % SOLN: 0.3 | 31 days supply | Qty: 5 | Fill #0

## 2019-01-06 NOTE — Patient Instructions (Signed)

## 2019-01-06 NOTE — Progress Notes (Signed)
[name] is a 53 y.o. female with the following history as recorded in EpicCare:  Patient Active Problem List   Diagnosis Date Noted  . Vitamin D deficiency disease 12/17/2017  . S/P laparoscopic sleeve gastrectomy 12/16/2017  . Chronic venous insufficiency 05/10/2017  . Acute midline low back pain with bilateral sciatica 01/20/2017  . Venous stasis syndrome 10/27/2016  . Snoring 12/05/2015  . Arthritis 01/09/2015  . Type 2 diabetes mellitus with hyperglycemia, without long-term current use of insulin ([place]) 09/13/2014  . Hyperlipidemia with target LDL less than 130 09/13/2014  . Dermatitis, asteototic 09/05/2014  . Alkaline phosphatase elevation 04/25/2013  . Carpal tunnel syndrome 12/30/2012  . Other screening mammogram 10/10/2012  . Migraine 08/07/2011  . Severe obesity (BMI >= 40) ([place]) 12/25/2010  . Routine general medical examination at a health care facility 09/16/2010  . Essential hypertension, benign 02/27/2009  . Osteoarthritis 02/27/2009    Current Outpatient Medications  Medication Sig Dispense Refill  . Blood Glucose Monitoring Suppl (ONETOUCH VERIO) w/Device KIT Use to check blood sugar once a day 1 kit 0  . BYSTOLIC 5 MG tablet TAKE 1 TABLET (5 MG TOTAL) BY MOUTH DAILY. 90 tablet 0  . calcium carbonate (TUMS EX) 750 MG chewable tablet Chew 1,500 mg by mouth daily.    . Cholecalciferol 50000 units capsule Take 1 capsule (50,000 Units total) by mouth once a week. 12 capsule 1  . cyclobenzaprine (FLEXERIL) 10 MG tablet Take 1 tablet (10 mg total) by mouth 3 (three) times daily as needed for muscle spasms. 45 tablet 0  . furosemide (LASIX) 40 MG tablet Take 1 tablet (40 mg total) by mouth daily as needed for fluid or edema. NEEDS APPOINTMENT FOR FUTURE REFILLS 90 tablet 0  . glucose blood (ONETOUCH VERIO) test strip Use to check blood sugar once daily. 100 each 2  . ibuprofen (ADVIL,MOTRIN) 200 MG tablet Take 400-800 mg by mouth every 6 (six) hours as needed for  headache or moderate pain.    . Lancets (ONETOUCH ULTRASOFT) lancets Use as instructed to check blood sugar once a day 100 each 2  . Multiple Vitamin (MULTIVITAMIN) tablet Take 1 tablet by mouth daily.    . naproxen (NAPROSYN) 375 MG tablet Take 375 mg by mouth 2 (two) times daily with a meal.    . triamterene-hydrochlorothiazide (DYAZIDE) 37.5-25 MG capsule TAKE 1 CAPSULE BY MOUTH DAILY. 90 capsule 0  . tobramycin (TOBREX) 0.3 % ophthalmic solution Place 1 drop into the left eye every 6 (six) hours. 5 mL 0   No current facility-administered medications for this visit.     Allergies: Patient has no known allergies.  Past Medical History:  Diagnosis Date  . Clotting disorder ([place])   . Diabetes mellitus without complication ([place])   . Edema 04/25/2015  . Headache(784.0)   . Herniated disc   . History of colonic polyps   . Hypertension   . Obesity   . Osteoarthritis   . Shortness of breath 04/25/2015  . TIA (transient ischemic attack) 2007   hx of  . Vasculitis ([facility])    L leg - Polyarthritis  . Venous insufficiency     Past Surgical History:  Procedure Laterality Date  . ABDOMINAL HYSTERECTOMY    . LAPAROSCOPIC GASTRIC SLEEVE RESECTION N/A 08/11/2016   Procedure: LAPAROSCOPIC GASTRIC SLEEVE RESECTION, UPPER ENDOSCOPY;  Surgeon: Arta Bruce Kinsinger, MD;  Location: [facility];  Service: General;  Laterality: N/A;    Family History  Problem Relation Age of  Onset  . Stroke Mother   . Hypertension Mother   . AAA (abdominal aortic aneurysm) Mother   . Hypertension Father   . Cancer Father        Prostate  . Arthritis Other   . Hypertension Other   . Stroke Other   . Colon cancer Neg Hx   . Esophageal cancer Neg Hx   . Rectal cancer Neg Hx   . Stomach cancer Neg Hx     Social History   Tobacco Use  . Smoking status: Never Smoker  . Smokeless tobacco: Never Used  Substance Use Topics  . Alcohol use: Yes    Alcohol/week: 0.0 standard drinks    Comment: rarely    Subjective:   Stye over left upper eyelid x 1 week; mildly painful; no vision changes or drainage; does not wear eye make-up; has been using warm compresses;      Objective:  Vitals:   01/06/19 1310  BP: 116/76  Pulse: 60  Temp: 97.8 F (36.6 C)  TempSrc: Oral  SpO2: 99%  Weight: 257 lb 0.3 oz (116.6 kg)  Height: _0  (1.549 m)    General: Well developed, well nourished, in no acute distress  Skin : Warm and dry.  Head: Normocephalic and atraumatic  Eyes: Sclera and conjunctiva clear; pupils round and reactive to light; extraocular movements intact; stye noted on upper left eyelid Lungs: Respirations unlabored;  Neurologic: Alert and oriented; speech intact; face symmetrical; moves all extremities well; CNII-XII intact without focal deficit    Assessment:  1. Hordeolum externum of left upper eyelid     Plan:  Rx for Tobramycin Opht Solution 1 gtt OS qid; continue to apply warm compresses; plan to follow-up with her ophthalmologist if symptoms persist.   No follow-ups on file.  No orders of the defined types were placed in this encounter.   Requested Prescriptions   Signed Prescriptions Disp Refills  . tobramycin (TOBREX) 0.3 % ophthalmic solution 5 mL 0    Sig: Place 1 drop into the left eye every 6 (six) hours.

## 2019-01-24 ENCOUNTER — Other Ambulatory Visit: Payer: Self-pay | Admitting: Internal Medicine

## 2019-01-24 DIAGNOSIS — I10 Essential (primary) hypertension: Secondary | ICD-10-CM

## 2019-01-25 MED FILL — TRIAMTERENE/HCTZ 37.5/25 CP: 37.5-25 | 90 days supply | Qty: 90 | Fill #0

## 2019-01-30 ENCOUNTER — Other Ambulatory Visit (HOSPITAL_COMMUNITY): Payer: 59

## 2019-02-09 ENCOUNTER — Other Ambulatory Visit: Payer: Self-pay | Admitting: Internal Medicine

## 2019-02-09 MED FILL — BYSTOLIC 5 MG TABLET: 5 | 90 days supply | Qty: 90 | Fill #0

## 2019-02-28 ENCOUNTER — Encounter (HOSPITAL_COMMUNITY): Payer: Self-pay

## 2019-03-14 ENCOUNTER — Encounter: Payer: Self-pay | Admitting: Internal Medicine

## 2019-03-15 ENCOUNTER — Ambulatory Visit: Payer: 59 | Admitting: Internal Medicine

## 2019-04-10 ENCOUNTER — Ambulatory Visit: Payer: 59 | Admitting: Internal Medicine

## 2019-04-28 ENCOUNTER — Other Ambulatory Visit: Payer: Self-pay | Admitting: Internal Medicine

## 2019-04-28 DIAGNOSIS — I10 Essential (primary) hypertension: Secondary | ICD-10-CM

## 2019-05-11 ENCOUNTER — Ambulatory Visit: Payer: 59 | Admitting: Internal Medicine

## 2019-05-11 ENCOUNTER — Encounter: Payer: Self-pay | Admitting: Internal Medicine

## 2019-05-11 ENCOUNTER — Other Ambulatory Visit: Payer: Self-pay

## 2019-05-11 VITALS — BP 138/94 | HR 70 | Temp 98.1°F | Resp 16 | Ht 61.0 in | Wt 260.0 lb

## 2019-05-11 DIAGNOSIS — R748 Abnormal levels of other serum enzymes: Secondary | ICD-10-CM | POA: Diagnosis not present

## 2019-05-11 DIAGNOSIS — E785 Hyperlipidemia, unspecified: Secondary | ICD-10-CM | POA: Diagnosis not present

## 2019-05-11 DIAGNOSIS — E1165 Type 2 diabetes mellitus with hyperglycemia: Secondary | ICD-10-CM

## 2019-05-11 DIAGNOSIS — Z0000 Encounter for general adult medical examination without abnormal findings: Secondary | ICD-10-CM | POA: Diagnosis not present

## 2019-05-11 DIAGNOSIS — I10 Essential (primary) hypertension: Secondary | ICD-10-CM | POA: Diagnosis not present

## 2019-05-11 DIAGNOSIS — D539 Nutritional anemia, unspecified: Secondary | ICD-10-CM

## 2019-05-11 DIAGNOSIS — Z9884 Bariatric surgery status: Secondary | ICD-10-CM | POA: Diagnosis not present

## 2019-05-11 DIAGNOSIS — E559 Vitamin D deficiency, unspecified: Secondary | ICD-10-CM

## 2019-05-11 MED ORDER — NEBIVOLOL HCL 5 MG PO TABS: 5.0000 mg | ORAL_TABLET | Freq: Every day | ORAL | 1 refills | Status: DC

## 2019-05-11 MED ORDER — TRIAMTERENE-HCTZ 37.5-25 MG PO CAPS: 1.0000 | ORAL_CAPSULE | Freq: Every day | ORAL | 0 refills | Status: DC

## 2019-05-11 MED FILL — TRIAMTERENE/HCTZ 37.5/25 CP: 37.5-25 | 90 days supply | Qty: 90 | Fill #0

## 2019-05-11 NOTE — Progress Notes (Signed)
Subjective:  Patient ID: [name], female    DOB: December 29, 1965  Age: 54 y.o. MRN: [id number]  CC: Annual Exam, Hyperlipidemia, and Hypertension  This visit occurred during the SARS-CoV-2 public health emergency.  Safety protocols were in place, including screening questions prior to the visit, additional usage of staff PPE, and extensive cleaning of exam room while observing appropriate contact time as indicated for disinfecting solutions.    HPI [name] presents for a CPX.  [name] is concerned that her blood pressure is not adequately well controlled.  [name] ran out of her diuretic about 3 days ago.  [name] complains of weight gain but denies headache, blurred vision, chest pain, shortness of breath, edema, or fatigue.  [name] is status post bariatric surgery and is only taking a multivitamin.  Outpatient Medications Prior to Visit  Medication Sig Dispense Refill  . Blood Glucose Monitoring Suppl (ONETOUCH VERIO) w/Device KIT Use to check blood sugar once a day 1 kit 0  . calcium carbonate (TUMS EX) 750 MG chewable tablet Chew 1,500 mg by mouth daily.    Marland Kitchen glucose blood (ONETOUCH VERIO) test strip Use to check blood sugar once daily. 100 each 2  . Lancets (ONETOUCH ULTRASOFT) lancets Use as instructed to check blood sugar once a day 100 each 2  . Multiple Vitamin (MULTIVITAMIN) tablet Take 1 tablet by mouth daily.    Marland Kitchen BYSTOLIC 5 MG tablet TAKE 1 TABLET BY MOUTH ONCE A DAY 90 tablet 0  . Cholecalciferol 50000 units capsule Take 1 capsule (50,000 Units total) by mouth once a week. 12 capsule 1  . cyclobenzaprine (FLEXERIL) 10 MG tablet Take 1 tablet (10 mg total) by mouth 3 (three) times daily as needed for muscle spasms. 45 tablet 0  . furosemide (LASIX) 40 MG tablet Take 1 tablet (40 mg total) by mouth daily as needed for fluid or edema. NEEDS APPOINTMENT FOR FUTURE REFILLS 90 tablet 0  . ibuprofen (ADVIL,MOTRIN) 200 MG tablet Take 400-800 mg by mouth every 6 (six) hours as needed  for headache or moderate pain.    . naproxen (NAPROSYN) 375 MG tablet Take 375 mg by mouth 2 (two) times daily with a meal.    . tobramycin (TOBREX) 0.3 % ophthalmic solution Place 1 drop into the left eye every 6 (six) hours. 5 mL 0  . triamterene-hydrochlorothiazide (DYAZIDE) 37.5-25 MG capsule TAKE 1 CAPSULE BY MOUTH DAILY. 90 capsule 0   No facility-administered medications prior to visit.    ROS Review of Systems  Constitutional: Positive for unexpected weight change. Negative for diaphoresis and fatigue.  Eyes: Negative for visual disturbance.  Respiratory: Negative for cough, chest tightness, shortness of breath and wheezing.   Cardiovascular: Negative for chest pain, palpitations and leg swelling.  Gastrointestinal: Negative for abdominal pain, blood in stool, diarrhea, nausea and vomiting.  Endocrine: Negative.   Genitourinary: Negative for difficulty urinating, dysuria and hematuria.  Musculoskeletal: Negative.  Negative for arthralgias, back pain and myalgias.  Skin: Negative.  Negative for color change and pallor.  Neurological: Negative.  Negative for dizziness, weakness, light-headedness and headaches.  Hematological: Negative.   Psychiatric/Behavioral: Negative.     Objective:  BP (!) 138/94 (BP Location: Left Arm, Patient Position: Sitting, Cuff Size: Large)   Pulse 70   Temp 98.1 F (36.7 C) (Oral)   Resp 16   Ht _0  (1.549 m)   Wt 260 lb (117.9 kg)   SpO2 99%   BMI 49.13 kg/m   BP  Readings from Last 3 Encounters:  05/11/19 (!) 138/94  01/06/19 116/76  04/26/18 114/82    Wt Readings from Last 3 Encounters:  05/11/19 260 lb (117.9 kg)  01/06/19 257 lb 0.3 oz (116.6 kg)  10/12/18 253 lb 1.6 oz (114.8 kg)    Physical Exam Vitals reviewed.  Constitutional:      General: [name] is not in acute distress.    Appearance: [name] is obese. [name] is not toxic-appearing.  HENT:     Nose: Nose normal.     Mouth/Throat:     Mouth: Mucous membranes are moist.  Mucous membranes are pale.  Eyes:     General: No scleral icterus.    Conjunctiva/sclera: Conjunctivae normal.  Cardiovascular:     Rate and Rhythm: Normal rate and regular rhythm.     Heart sounds: No murmur.  Pulmonary:     Effort: Pulmonary effort is normal.     Breath sounds: No stridor. No wheezing, rhonchi or rales.  Abdominal:     General: Abdomen is protuberant. Bowel sounds are normal. There is no distension.     Palpations: There is no hepatomegaly, splenomegaly or mass.     Tenderness: There is no abdominal tenderness. There is no guarding.  Musculoskeletal:        General: Normal range of motion.     Cervical back: Neck supple.     Right lower leg: No edema.     Left lower leg: No edema.  Lymphadenopathy:     Cervical: No cervical adenopathy.  Skin:    General: Skin is warm and dry.  Neurological:     General: No focal deficit present.     Mental Status: [name] is alert.  Psychiatric:        Mood and Affect: Mood normal.        Behavior: Behavior normal.     Lab Results  Component Value Date   WBC 7.6 05/11/2019   HGB 11.6 (L) 05/11/2019   HCT 35.2 (L) 05/11/2019   PLT 245.0 05/11/2019   GLUCOSE 86 05/11/2019   CHOL 179 05/11/2019   TRIG 84.0 05/11/2019   HDL 56.80 05/11/2019   LDLCALC 105 (H) 05/11/2019   ALT 16 05/11/2019   AST 19 05/11/2019   NA 138 05/11/2019   K 4.3 05/11/2019   CL 106 05/11/2019   CREATININE 1.12 05/11/2019   BUN 17 05/11/2019   CO2 27 05/11/2019   TSH 2.27 05/11/2019   INR 1.0 05/25/2007   HGBA1C 6.3 05/11/2019   MICROALBUR <0.7 05/11/2019    CT Head Wo Contrast  Result Date: 12/20/2017 CLINICAL DATA:  Hypertension, headache EXAM: CT HEAD WITHOUT CONTRAST TECHNIQUE: Contiguous axial images were obtained from the base of the skull through the vertex without intravenous contrast. COMPARISON:  05/25/2007 FINDINGS: Brain: No acute intracranial abnormality. Specifically, no hemorrhage, hydrocephalus, mass lesion, acute infarction,  or significant intracranial injury. Vascular: No hyperdense vessel or unexpected calcification. Skull: No acute calvarial abnormality. Sinuses/Orbits: Visualized paranasal sinuses and mastoids clear. Orbital soft tissues unremarkable. Other: None IMPRESSION: Normal study. Electronically Signed   By: Rolm Baptise M.D.   On: 12/20/2017 14:00    Assessment & Plan:   [name] was seen today for annual exam, hyperlipidemia and hypertension.  Diagnoses and all orders for this visit:  Essential hypertension, benign- Her blood pressure is not adequately well controlled.  [name] will restart triamterene/hydrochlorothiazide and will stay on the current dose of nebivolol.  [name] also agrees to improve her lifestyle modifications. -  CBC with Differential/Platelet -     TSH -     Magnesium -     nebivolol (BYSTOLIC) 5 MG tablet; Take 1 tablet (5 mg total) by mouth daily. -     triamterene-hydrochlorothiazide (DYAZIDE) 37.5-25 MG capsule; Take 1 each (1 capsule total) by mouth daily.  Routine general medical examination at a health care facility - Exam completed, labs reviewed, vaccines reviewed and updated, screening for cervical cancer is not indicated since [name] is status post hysterectomy, colon cancer screening is up-to-date, [name] tells me [name] has a mammogram scheduled on February 8.  Patient education was given. -     Lipid panel  Alkaline phosphatase elevation- [name] has a chronic, mild elevation in her alk phos but her other LFTs are normal.  This is benign and consistent with a history of diabetes mellitus. -     Hepatic function panel  Hyperlipidemia with target LDL less than 130- Her ASCVD risk score is less than 15% so I did not recommend a statin for CV risk reduction. -     Hepatic function panel -     TSH  S/P laparoscopic sleeve gastrectomy- [name] is anemic and has a low vitamin D level.  I will check her for vitamin deficiencies and will treat the Vit D deficiency. -     IBC panel; Future -      Reticulocytes; Future -     Folate; Future -     Ferritin; Future -     Vitamin B1; Future -     Zinc; Future -     Vitamin B12; Future -     Cholecalciferol 1.25 MG (50000 UT) capsule; Take 1 capsule (50,000 Units total) by mouth once a week.  Vitamin D deficiency disease -     VITAMIN D 25 Hydroxy (Vit-D Deficiency, Fractures) -     Cholecalciferol 1.25 MG (50000 UT) capsule; Take 1 capsule (50,000 Units total) by mouth once a week.  Type 2 diabetes mellitus with hyperglycemia, without long-term current use of insulin ([place])- Her A1c is at 6.3% on no medical therapy.  [name] is prediabetic not diabetic.  I therefore do not feel compelled to prescribe a statin at this time. -     Basic metabolic panel -     Hemoglobin A1c -     Urinalysis, Routine w reflex microscopic -     Microalbumin / creatinine urine ratio  Severe obesity (BMI >= 40) ([place])- [name] agrees to improve her lifestyle modifications.  Deficiency anemia -     IBC panel; Future -     Reticulocytes; Future -     Folate; Future -     Ferritin; Future -     Vitamin B1; Future -     Zinc; Future -     Vitamin B12; Future   I have discontinued [name]'s ibuprofen, naproxen, cyclobenzaprine, furosemide, and tobramycin. I have changed her Bystolic to nebivolol. I have also changed her triamterene-hydrochlorothiazide and Cholecalciferol. Additionally, I am having her maintain her multivitamin, calcium carbonate, onetouch ultrasoft, OneTouch Verio, and glucose blood.  Meds ordered this encounter  Medications  . nebivolol (BYSTOLIC) 5 MG tablet    Sig: Take 1 tablet (5 mg total) by mouth daily.    Dispense:  90 tablet    Refill:  1  . triamterene-hydrochlorothiazide (DYAZIDE) 37.5-25 MG capsule    Sig: Take 1 each (1 capsule total) by mouth daily.    Dispense:  90 capsule  Refill:  0  . Cholecalciferol 1.25 MG (50000 UT) capsule    Sig: Take 1 capsule (50,000 Units total) by mouth once a week.    Dispense:   12 capsule    Refill:  0     Follow-up: Return in about 3 months (around 08/09/2019).  Scarlette Calico, MD

## 2019-05-11 NOTE — Patient Instructions (Signed)

## 2019-05-12 ENCOUNTER — Encounter: Payer: Self-pay | Admitting: Internal Medicine

## 2019-05-12 DIAGNOSIS — D539 Nutritional anemia, unspecified: Secondary | ICD-10-CM | POA: Insufficient documentation

## 2019-05-12 LAB — BASIC METABOLIC PANEL
BUN: 17 mg/dL (ref 6–23)
CO2: 27 mEq/L (ref 19–32)
Calcium: 9 mg/dL (ref 8.4–10.5)
Chloride: 106 mEq/L (ref 96–112)
Creatinine, Ser: 1.12 mg/dL (ref 0.40–1.20)
GFR: 61.75 mL/min (ref >60.00)
Glucose, Bld: 86 mg/dL (ref 70–99)
Potassium: 4.3 mEq/L (ref 3.5–5.1)
Sodium: 138 mEq/L (ref 135–145)

## 2019-05-12 LAB — CBC WITH DIFFERENTIAL/PLATELET
Basophils Absolute: 0 10*3/uL (ref 0.0–0.1)
Basophils Relative: 0.6 % (ref 0.0–3.0)
Eosinophils Absolute: 0.3 10*3/uL (ref 0.0–0.7)
Eosinophils Relative: 3.3 % (ref 0.0–5.0)
HCT: 35.2 % — ABNORMAL LOW (ref 36.0–46.0)
Hemoglobin: 11.6 g/dL — ABNORMAL LOW (ref 12.0–15.0)
Lymphocytes Relative: 26.8 % (ref 12.0–46.0)
Lymphs Abs: 2 10*3/uL (ref 0.7–4.0)
MCHC: 32.9 g/dL (ref 30.0–36.0)
MCV: 81.7 fl (ref 78.0–100.0)
Monocytes Absolute: 0.5 10*3/uL (ref 0.1–1.0)
Monocytes Relative: 6.2 % (ref 3.0–12.0)
Neutro Abs: 4.8 10*3/uL (ref 1.4–7.7)
Neutrophils Relative %: 63.1 % (ref 43.0–77.0)
Platelets: 245 10*3/uL (ref 150.0–400.0)
RBC: 4.31 Mil/uL (ref 3.87–5.11)
RDW: 15.6 % — ABNORMAL HIGH (ref 11.5–15.5)
WBC: 7.6 10*3/uL (ref 4.0–10.5)

## 2019-05-12 LAB — LIPID PANEL
Cholesterol: 179 mg/dL (ref 0–200)
HDL: 56.8 mg/dL (ref >39.00)
LDL Cholesterol: 105 mg/dL — ABNORMAL HIGH (ref 0–99)
NonHDL: 122.1
Total CHOL/HDL Ratio: 3
Triglycerides: 84 mg/dL (ref 0.0–149.0)
VLDL: 16.8 mg/dL (ref 0.0–40.0)

## 2019-05-12 LAB — URINALYSIS, ROUTINE W REFLEX MICROSCOPIC
Bilirubin Urine: NEGATIVE
Hgb urine dipstick: NEGATIVE
Ketones, ur: NEGATIVE
Leukocytes,Ua: NEGATIVE
Nitrite: NEGATIVE
Specific Gravity, Urine: 1.015 (ref 1.000–1.030)
Total Protein, Urine: NEGATIVE
Urine Glucose: NEGATIVE
Urobilinogen, UA: 1 (ref 0.0–1.0)
pH: 7.5 (ref 5.0–8.0)

## 2019-05-12 LAB — HEPATIC FUNCTION PANEL
ALT: 16 U/L (ref 0–35)
AST: 19 U/L (ref 0–37)
Albumin: 3.7 g/dL (ref 3.5–5.2)
Alkaline Phosphatase: 255 U/L — ABNORMAL HIGH (ref 39–117)
Bilirubin, Direct: 0 mg/dL (ref 0.0–0.3)
Total Bilirubin: 0.3 mg/dL (ref 0.2–1.2)
Total Protein: 6.8 g/dL (ref 6.0–8.3)

## 2019-05-12 LAB — MICROALBUMIN / CREATININE URINE RATIO
Creatinine,U: 88.5 mg/dL
Microalb Creat Ratio: 0.8 mg/g (ref 0.0–30.0)
Microalb, Ur: <0.7 mg/dL (ref 0.0–1.9)

## 2019-05-12 LAB — HEMOGLOBIN A1C: Hgb A1c MFr Bld: 6.3 % (ref 4.6–6.5)

## 2019-05-12 LAB — MAGNESIUM: Magnesium: 1.9 mg/dL (ref 1.5–2.5)

## 2019-05-12 LAB — VITAMIN D 25 HYDROXY (VIT D DEFICIENCY, FRACTURES): VITD: 22.7 ng/mL — ABNORMAL LOW (ref 30.00–100.00)

## 2019-05-12 LAB — TSH: TSH: 2.27 u[IU]/mL (ref 0.35–4.50)

## 2019-05-12 MED ORDER — CHOLECALCIFEROL 1.25 MG (50000 UT) PO CAPS: 50000.0000 [IU] | ORAL_CAPSULE | ORAL | 0 refills | Status: DC

## 2019-05-12 MED FILL — VITAMIN D3 50,000 UNITS CAP: 1.25 MG | 84 days supply | Qty: 12 | Fill #0

## 2019-05-15 ENCOUNTER — Other Ambulatory Visit: Payer: 59

## 2019-05-15 ENCOUNTER — Other Ambulatory Visit (INDEPENDENT_AMBULATORY_CARE_PROVIDER_SITE_OTHER): Payer: 59

## 2019-05-15 DIAGNOSIS — D539 Nutritional anemia, unspecified: Secondary | ICD-10-CM

## 2019-05-15 DIAGNOSIS — Z9884 Bariatric surgery status: Secondary | ICD-10-CM

## 2019-05-15 LAB — FOLATE: Folate: 11 ng/mL (ref >5.9)

## 2019-05-15 LAB — FERRITIN: Ferritin: 137 ng/mL (ref 10.0–291.0)

## 2019-05-15 LAB — IBC PANEL
Iron: 74 ug/dL (ref 42–145)
Saturation Ratios: 23.2 % (ref 20.0–50.0)
Transferrin: 228 mg/dL (ref 212.0–360.0)

## 2019-05-15 LAB — VITAMIN B12: Vitamin B-12: 405 pg/mL (ref 211–911)

## 2019-05-16 ENCOUNTER — Encounter: Payer: Self-pay | Admitting: Internal Medicine

## 2019-05-18 ENCOUNTER — Encounter: Payer: Self-pay | Admitting: Internal Medicine

## 2019-05-18 ENCOUNTER — Other Ambulatory Visit: Payer: Self-pay | Admitting: Internal Medicine

## 2019-05-18 DIAGNOSIS — E519 Thiamine deficiency, unspecified: Secondary | ICD-10-CM

## 2019-05-18 DIAGNOSIS — D538 Other specified nutritional anemias: Secondary | ICD-10-CM | POA: Insufficient documentation

## 2019-05-18 LAB — RETICULOCYTES
ABS Retic: 88920 cells/uL — ABNORMAL HIGH (ref 20000–8000)
Retic Ct Pct: 1.9 %

## 2019-05-18 LAB — ZINC: Zinc: 75 ug/dL (ref 60–130)

## 2019-05-18 LAB — VITAMIN B1: Vitamin B1 (Thiamine): 8 nmol/L (ref 8–30)

## 2019-05-18 MED ORDER — THIAMINE HCL 100 MG PO TABS: 100.0000 mg | ORAL_TABLET | ORAL | 1 refills | Status: DC

## 2019-05-22 ENCOUNTER — Encounter: Payer: Self-pay | Admitting: Internal Medicine

## 2019-05-22 NOTE — Telephone Encounter (Signed)
Key: [id number]

## 2019-05-23 ENCOUNTER — Other Ambulatory Visit: Payer: Self-pay | Admitting: Internal Medicine

## 2019-05-23 DIAGNOSIS — I10 Essential (primary) hypertension: Secondary | ICD-10-CM

## 2019-05-23 MED ORDER — CARVEDILOL 3.125 MG PO TABS: 3.1250 mg | ORAL_TABLET | Freq: Two times a day (BID) | ORAL | 1 refills | Status: DC

## 2019-05-23 MED FILL — CARVEDILOL 3.125 MG TABLET: 3.125 | 90 days supply | Qty: 180 | Fill #0

## 2019-05-26 ENCOUNTER — Ambulatory Visit: Payer: 59 | Admitting: Family Medicine

## 2019-06-02 ENCOUNTER — Ambulatory Visit: Payer: 59 | Admitting: Family Medicine

## 2019-06-06 ENCOUNTER — Encounter: Payer: Self-pay | Admitting: Internal Medicine

## 2019-06-28 ENCOUNTER — Encounter: Payer: Self-pay | Admitting: Internal Medicine

## 2019-06-30 ENCOUNTER — Ambulatory Visit: Payer: 59 | Admitting: Family Medicine

## 2019-07-07 LAB — HM DIABETES EYE EXAM

## 2019-07-12 ENCOUNTER — Other Ambulatory Visit: Payer: Self-pay

## 2019-07-12 ENCOUNTER — Ambulatory Visit (INDEPENDENT_AMBULATORY_CARE_PROVIDER_SITE_OTHER): Payer: 59 | Admitting: Bariatrics

## 2019-07-12 ENCOUNTER — Encounter (INDEPENDENT_AMBULATORY_CARE_PROVIDER_SITE_OTHER): Payer: Self-pay | Admitting: Bariatrics

## 2019-07-12 VITALS — BP 113/74 | HR 62 | Temp 98.1°F | Ht 62.0 in | Wt 245.0 lb

## 2019-07-12 DIAGNOSIS — R5383 Other fatigue: Secondary | ICD-10-CM | POA: Diagnosis not present

## 2019-07-12 DIAGNOSIS — E119 Type 2 diabetes mellitus without complications: Secondary | ICD-10-CM

## 2019-07-12 DIAGNOSIS — R0602 Shortness of breath: Secondary | ICD-10-CM

## 2019-07-12 DIAGNOSIS — E559 Vitamin D deficiency, unspecified: Secondary | ICD-10-CM

## 2019-07-12 DIAGNOSIS — Z9189 Other specified personal risk factors, not elsewhere classified: Secondary | ICD-10-CM | POA: Diagnosis not present

## 2019-07-12 DIAGNOSIS — D538 Other specified nutritional anemias: Secondary | ICD-10-CM

## 2019-07-12 DIAGNOSIS — Z1331 Encounter for screening for depression: Secondary | ICD-10-CM | POA: Diagnosis not present

## 2019-07-12 DIAGNOSIS — Z6841 Body Mass Index (BMI) 40.0 and over, adult: Secondary | ICD-10-CM

## 2019-07-12 DIAGNOSIS — I10 Essential (primary) hypertension: Secondary | ICD-10-CM | POA: Diagnosis not present

## 2019-07-12 DIAGNOSIS — Z9884 Bariatric surgery status: Secondary | ICD-10-CM

## 2019-07-12 DIAGNOSIS — E519 Thiamine deficiency, unspecified: Secondary | ICD-10-CM

## 2019-07-12 DIAGNOSIS — Z0289 Encounter for other administrative examinations: Secondary | ICD-10-CM

## 2019-07-12 DIAGNOSIS — E7849 Other hyperlipidemia: Secondary | ICD-10-CM

## 2019-07-12 NOTE — Progress Notes (Signed)
Chief Complaint:   OBESITY [name] (MR# [id number]) is a 54 y.o. female who presents for evaluation and treatment of obesity and related comorbidities. Current BMI is Body mass index is 44.81 kg/m.Marland Kitchen [name] has been struggling with her weight for many years and has been unsuccessful in either losing weight, maintaining weight loss, or reaching her healthy weight goal.  [name] is currently in the action stage of change and ready to dedicate time achieving and maintaining a healthier weight. [name] is interested in becoming our patient and working on intensive lifestyle modifications including (but not limited to) diet and exercise for weight loss.  [name] does like to cook She craves sweets. She considers herself to be a "picky" eater.  [name]'s habits were reviewed today and are as follows: Her family eats meals together, she thinks her family will eat healthier with her, her desired weight loss is 85 lbs, she started gaining weight after her first child, her heaviest weight ever was 305 pounds (before surgery), she is a picky eater and doesn't like to eat healthier foods, she craves sweets, she skips breakfast frequently, she is frequently drinking liquids with calories, she sometimes makes poor food choices, she has binge eating behaviors and she struggles with emotional eating.  Depression Screen [name]'s Food and Mood (modified PHQ-9) score was 3.  Depression screen [facility] 2/9 07/12/2019  Decreased Interest 0  Down, Depressed, Hopeless 0  PHQ - 2 Score 0  Altered sleeping 0  Tired, decreased energy 3  Change in appetite 0  Feeling bad or failure about yourself  0  Trouble concentrating 0  Moving slowly or fidgety/restless 0  Suicidal thoughts 0  PHQ-9 Score 3  Difficult doing work/chores Not difficult at all  Some recent data might be hidden   Subjective:   Other fatigue. [name] denies daytime somnolence and denies waking up still tired. [name] generally gets 6-7 hours  of sleep per night, and states that she has generally restful sleep. Snoring is present. Apneic episodes are not present. Epworth Sleepiness Score is 9.  SOB (shortness of breath) on exertion. [name] notes increasing shortness of breath with exercising and seems to be worsening over time with weight gain. She notes getting out of breath sooner with activity than she used to. This has gotten worse recently. [name] denies shortness of breath at rest or orthopnea.  Essential hypertension. [name] is taking Coreg, Triamterene, and HCTZ. BLood pressure is controlled.  BP Readings from Last 3 Encounters:  07/12/19 113/74  05/11/19 (!) 138/94  01/06/19 116/76   Lab Results  Component Value Date   CREATININE 1.12 05/11/2019   CREATININE 1.04 04/26/2018   CREATININE 1.16 02/17/2018   Type 2 diabetes mellitus without complication, without long-term current use of insulin ([name]). [name] is on no medications.   Lab Results  Component Value Date   HGBA1C 6.3 05/11/2019   HGBA1C 6.2 04/26/2018   HGBA1C 5.6 12/16/2017   Lab Results  Component Value Date   MICROALBUR <0.7 05/11/2019   LDLCALC 105 (H) 05/11/2019   CREATININE 1.12 05/11/2019   No results found for: INSULIN  Vitamin D deficiency. [name] is taking high dose Vitamin D. Vitamin D 22.70 on 05/11/2019.  S/P laparoscopic sleeve gastrectomy. [name] reports her highest weight to be 280 lbs with her lowest weight 205 lbs. She reports a history of B12 deficiency. She is taking B1 and bariatric advantage Vitamin D. Vitamin B1 level reported to be 8 on 05/15/2019.  Other hyperlipidemia. LDL  less than 130. [name] is on no medications.   Lab Results  Component Value Date   CHOL 179 05/11/2019   HDL 56.80 05/11/2019   LDLCALC 105 (H) 05/11/2019   TRIG 84.0 05/11/2019   CHOLHDL 3 05/11/2019   Lab Results  Component Value Date   ALT 16 05/11/2019   AST 19 05/11/2019   ALKPHOS 255 (H) 05/11/2019   BILITOT 0.3 05/11/2019   The 10-year  ASCVD risk score Natalie Bussing DC Jr., et al., 2013) is: 5.1%   Values used to calculate the score:     Age: 44 years     Sex: Female     Is Non-Hispanic African American: Yes     Diabetic: Yes     Tobacco smoker: No     Systolic Blood Pressure: 123456 mmHg     Is BP treated: Yes     HDL Cholesterol: 56.8 mg/dL     Total Cholesterol: 179 mg/dL  Anemia due to acquired thiamine deficiency. [name] takes bariatric advantage with iron.  Depression screening. [name] had a negative depression screen with a PHQ-9 score of 3.  At risk for osteoporosis. [name] is at higher risk of osteopenia and osteoporosis due to Vitamin D deficiency.   Assessment/Plan:   Other fatigue. [name] does feel that her weight is causing her energy to be lower than it should be. Fatigue may be related to obesity, depression or many other causes. Labs will be ordered, and in the meanwhile, [name] will focus on self care including making healthy food choices, increasing physical activity and focusing on stress reduction.  EKG 12-Lead, Vitamin B1  SOB (shortness of breath) on exertion. [name] does feel that she gets out of breath more easily that she used to when she exercises. [name]'s shortness of breath appears to be obesity related and exercise induced. She has agreed to work on weight loss and gradually increase exercise to treat her exercise induced shortness of breath. Will continue to monitor closely.  Essential hypertension. [name] is working on healthy weight loss and exercise to improve blood pressure control. We will watch for signs of hypotension as she continues her lifestyle modifications. She will continue her medications as directed.  Type 2 diabetes mellitus without complication, without long-term current use of insulin ([name]). Good blood sugar control is important to decrease the likelihood of diabetic complications such as nephropathy, neuropathy, limb loss, blindness, coronary artery disease, and death. Intensive  lifestyle modification including diet, exercise and weight loss are the first line of treatment for diabetes. [name] will decrease carbohydrates and increase activity. Insulin, random labs ordered today.  Vitamin D deficiency. Low Vitamin D level contributes to fatigue and are associated with obesity, breast, and colon cancer. VITAMIN D 25 Hydroxy (Vit-D Deficiency, Fractures) level ordered today.  S/P laparoscopic sleeve gastrectomy. [name] will continue vitamin supplements (B1 and bariatric advantage) as directed.  Other hyperlipidemia. Cardiovascular risk and specific lipid/LDL goals reviewed.  We discussed several lifestyle modifications today and [name] will continue to work on diet, exercise and weight loss efforts. Orders and follow up as documented in patient record. Bilie will decrease saturated fats and increase PUFA's and MUFA's.  Counseling Intensive lifestyle modifications are the first line treatment for this issue. . Dietary changes: Increase soluble fiber. Decrease simple carbohydrates. . Exercise changes: Moderate to vigorous-intensity aerobic activity 150 minutes per week if tolerated. . Lipid-lowering medications: see documented in medical record.  Anemia due to acquired thiamine deficiency. [name] will continue bariatric advantage as directed. CBC with  Differential/Platelet labs ordered today.  Depression screening. [name] had a negative depression screening.  At risk for osteoporosis. [name] was given approximately 15 minutes of osteoporosis prevention counseling today. [name] is at risk for osteopenia and osteoporosis due to her Vitamin D deficiency. She was encouraged to take her Vitamin D and follow her higher calcium diet and increase strengthening exercise to help strengthen her bones and decrease her risk of osteopenia and osteoporosis.  Repetitive spaced learning was employed today to elicit superior memory formation and behavioral change.  Class 3 severe obesity  with serious comorbidity and body mass index (BMI) of 40.0 to 44.9 in adult, unspecified obesity type ([place]).  [name] is currently in the action stage of change and her goal is to continue with weight loss efforts. I recommend [name] begin the structured treatment plan as follows:  She has agreed to the Category 2 Plan.  She will work on meal planning, mindful eating, and will stop all sugary drinks.  We reviewed with the patient labs from 05/11/2019 including CMP, lipids, iron/anemia profile; CBC, A1c, and thyroid from 05/15/2019.  Exercise goals: All adults should avoid inactivity. Some physical activity is better than none, and adults who participate in any amount of physical activity gain some health benefits.   Behavioral modification strategies: increasing lean protein intake, decreasing simple carbohydrates, increasing vegetables, increasing water intake, decreasing eating out, no skipping meals, meal planning and cooking strategies, keeping healthy foods in the home, ways to avoid boredom eating, avoiding temptations and planning for success.  She was informed of the importance of frequent follow-up visits to maximize her success with intensive lifestyle modifications for her multiple health conditions. She was informed we would discuss her lab results at her next visit unless there is a critical issue that needs to be addressed sooner. Ailsa agreed to keep her next visit at the agreed upon time to discuss these results.  Objective:   Blood pressure 113/74, pulse 62, temperature 98.1 F (36.7 C), height 5\' 2"  (1.575 m), weight 245 lb (111.1 kg), SpO2 99 %. Body mass index is 44.81 kg/m.  EKG: Sinus  Rhythm with a rate of 65 BPM. Low voltage in precordial leads may be related to body habitus. Otherwise normal.   Indirect Calorimeter completed today shows a VO2 of 227 and a REE of 1577.  Her calculated basal metabolic rate is [id number] thus her basal metabolic rate is worse than  expected.  General: Cooperative, alert, well developed, in no acute distress. HEENT: Conjunctivae and lids unremarkable. Cardiovascular: Regular rhythm.  Lungs: Normal work of breathing. Neurologic: No focal deficits.   Lab Results  Component Value Date   CREATININE 1.12 05/11/2019   BUN 17 05/11/2019   NA 138 05/11/2019   K 4.3 05/11/2019   CL 106 05/11/2019   CO2 27 05/11/2019   Lab Results  Component Value Date   ALT 16 05/11/2019   AST 19 05/11/2019   ALKPHOS 255 (H) 05/11/2019   BILITOT 0.3 05/11/2019   Lab Results  Component Value Date   HGBA1C 6.3 05/11/2019   HGBA1C 6.2 04/26/2018   HGBA1C 5.6 12/16/2017   HGBA1C 5.9 05/10/2017   HGBA1C 5.5 02/26/2017   No results found for: INSULIN Lab Results  Component Value Date   TSH 2.27 05/11/2019   Lab Results  Component Value Date   CHOL 179 05/11/2019   HDL 56.80 05/11/2019   LDLCALC 105 (H) 05/11/2019   TRIG 84.0 05/11/2019   CHOLHDL 3 05/11/2019   Lab  Results  Component Value Date   WBC 7.6 05/11/2019   HGB 11.6 (L) 05/11/2019   HCT 35.2 (L) 05/11/2019   MCV 81.7 05/11/2019   PLT 245.0 05/11/2019   Lab Results  Component Value Date   IRON 74 05/15/2019   FERRITIN 137.0 05/15/2019   Attestation Statements:   Reviewed by clinician on day of visit: allergies, medications, problem list, medical history, surgical history, family history, social history, and previous encounter notes.  Migdalia Dk, am acting as Location manager for [employer], DO   I have reviewed the above documentation for accuracy and completeness, and I agree with the above. Jearld Lesch, DO

## 2019-07-16 IMAGING — CT CT HEAD W/O CM
3 series · 15 of 46 positions shown, 18 images · non-contrast
Comparison: 05/25/2007

CLINICAL DATA: Hypertension, headache

EXAM:
CT HEAD WITHOUT CONTRAST
TECHNIQUE: Contiguous axial images were obtained from the base of the skull
through the vertex without intravenous contrast.

[Series 2: head wo · axial · 0.47mm/px · z∈[-140,-20]mm · 9 of 29 slices shown, 12 images]
[im 3/29  brain]
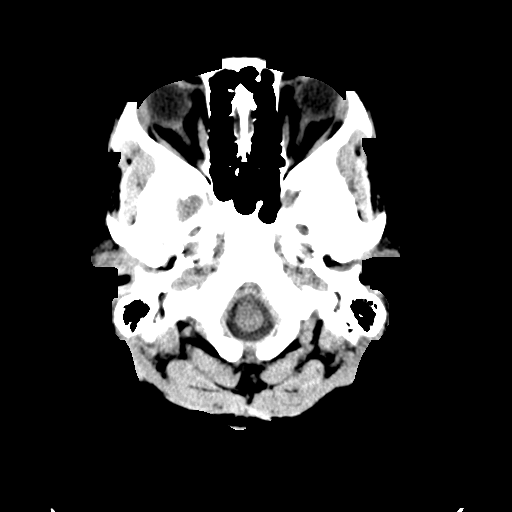
[im 3/29  bone]
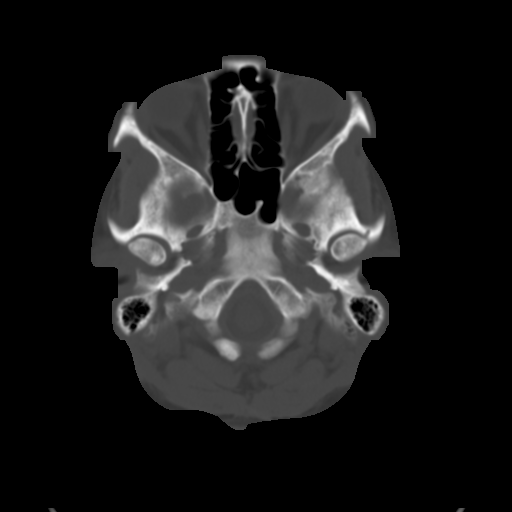
[im 6/29  brain]
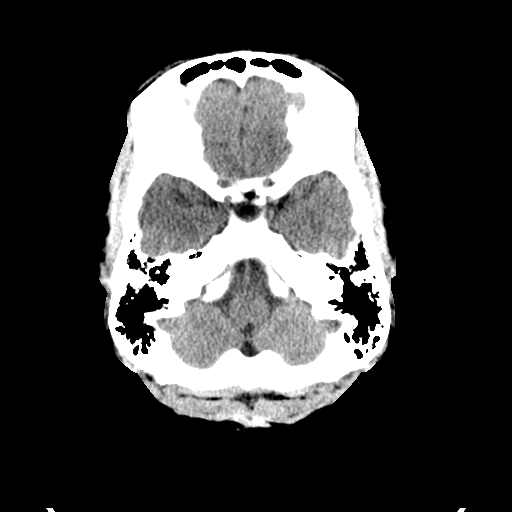
[im 9/29  brain]
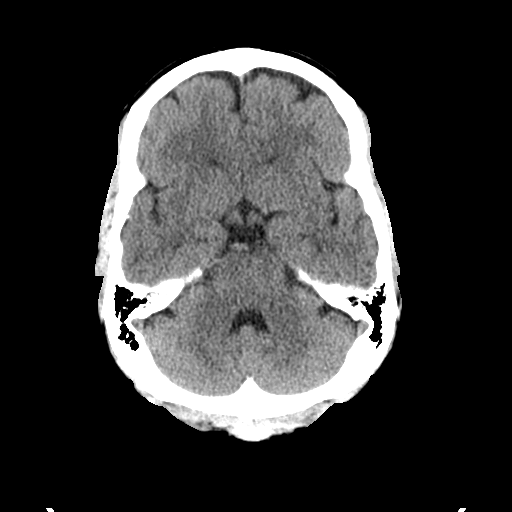
[im 12/29  brain]
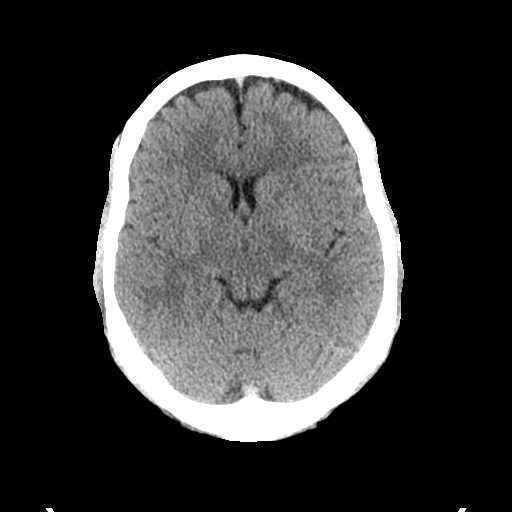
[im 15/29  brain]
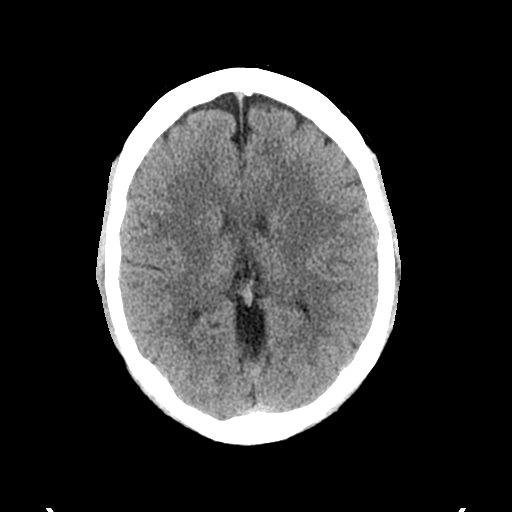
[im 15/29  bone]
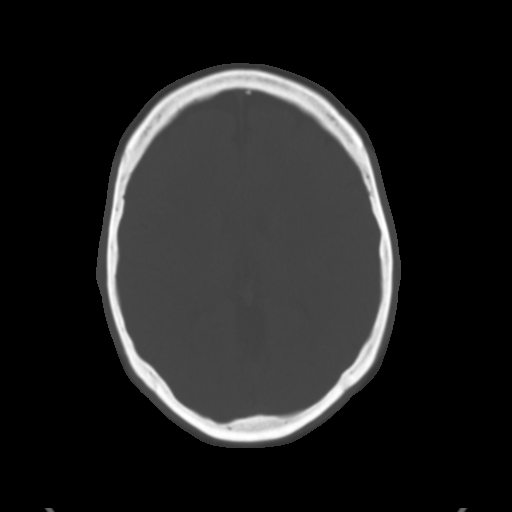
[im 18/29  brain]
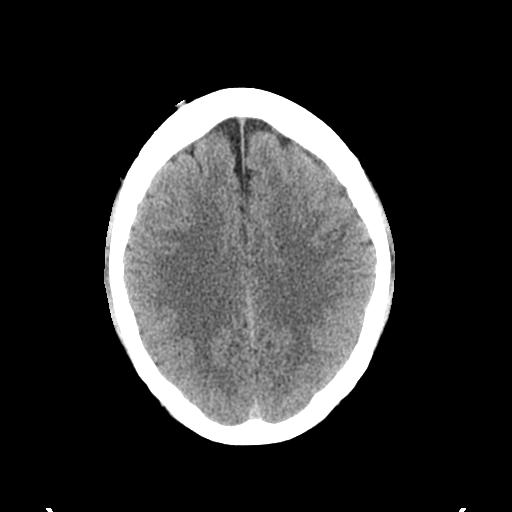
[im 21/29  brain]
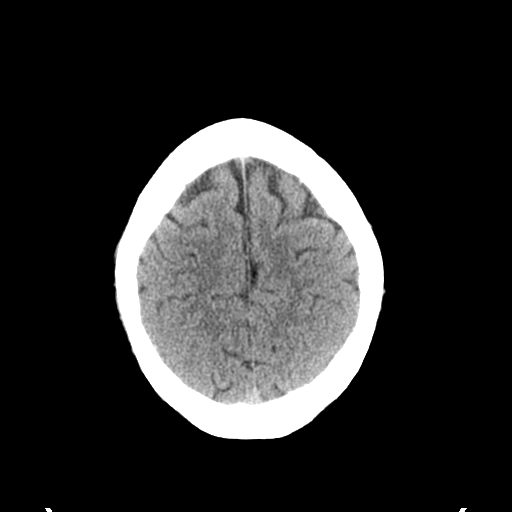
[im 24/29  brain]
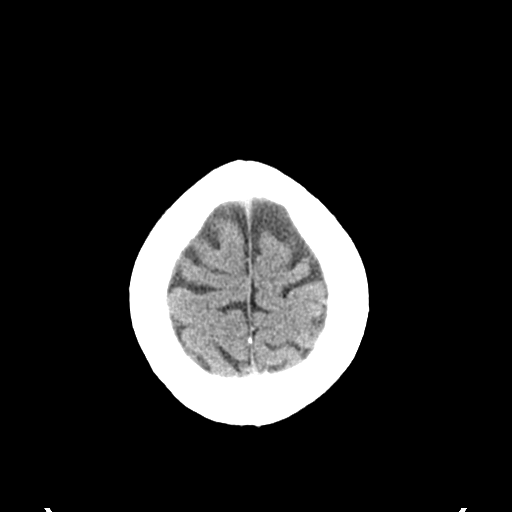
[im 27/29  brain]
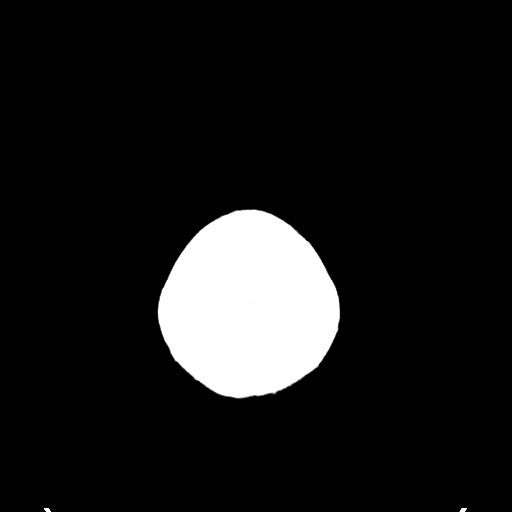
[im 27/29  bone]
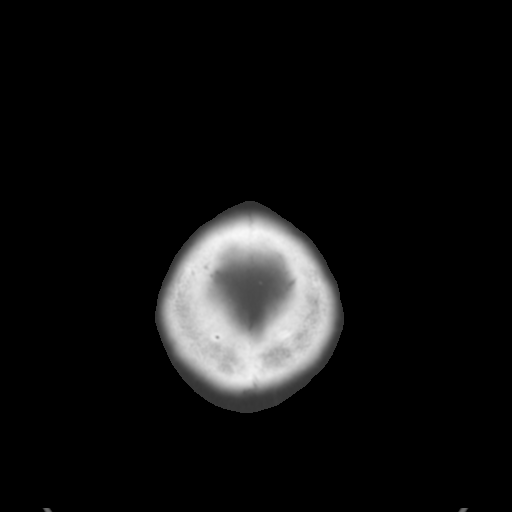

[Series 5: coronal soft tissue · coronal · 0.28mm/px · 3 of 66 slices shown]
[im 22/66  brain]
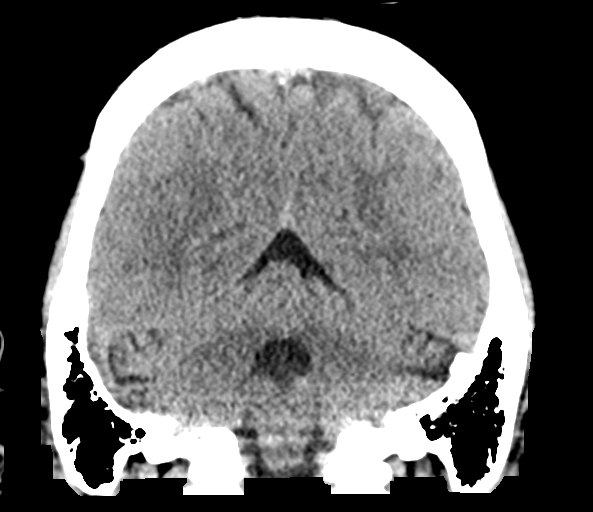
[im 29/66  brain]
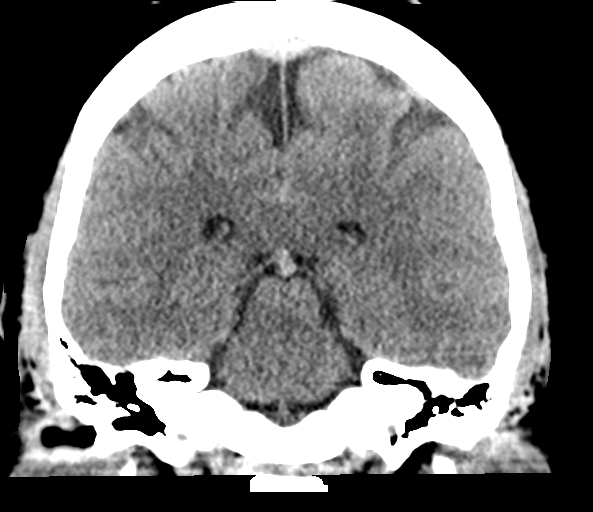
[im 37/66  brain]
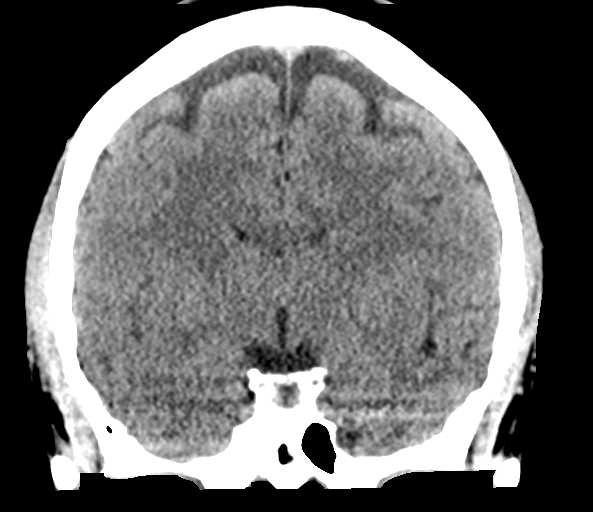

[Series 6: sagittal soft tissue · sagittal · 0.29mm/px · 3 of 58 slices shown]
[im 20/58  brain]
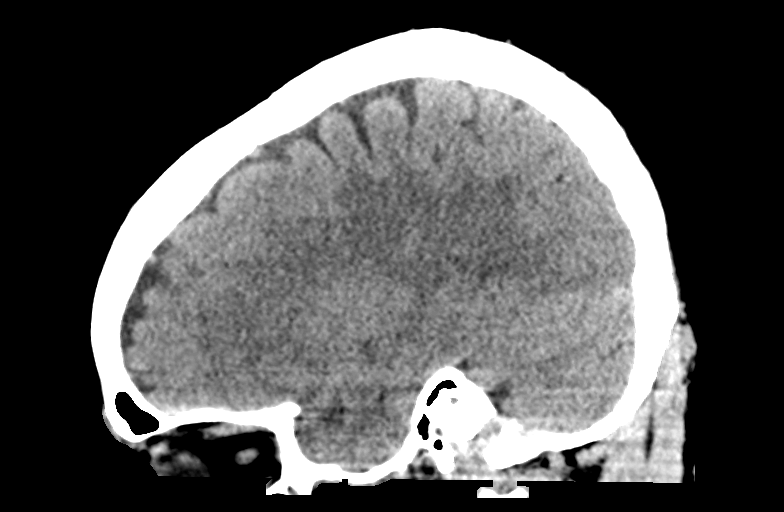
[im 29/58  brain]
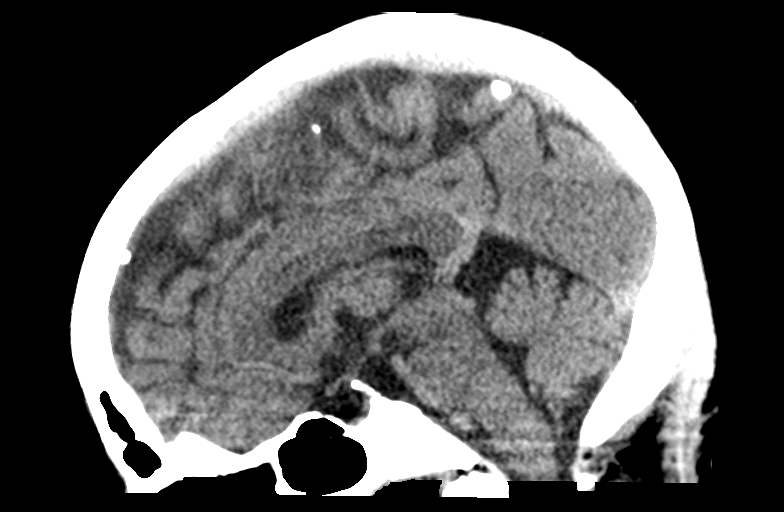
[im 39/58  brain]
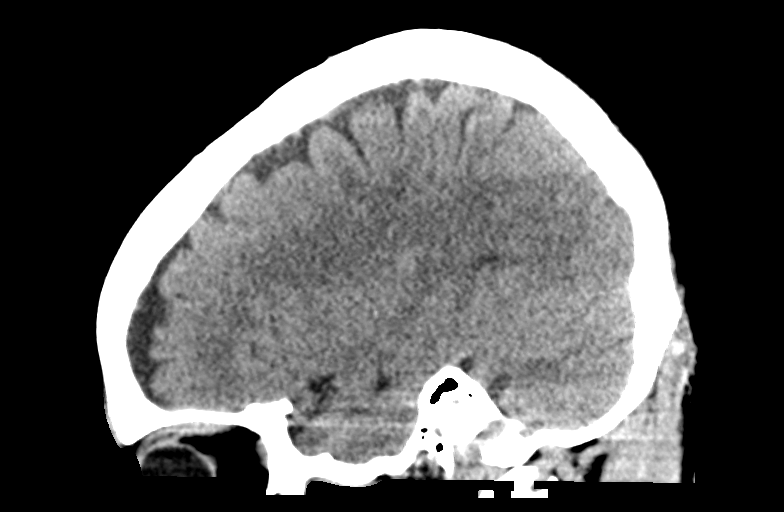

[15 of 46 positions shown; findings below may reference images not displayed]

FINDINGS: Brain: No acute intracranial abnormality. Specifically, no
hemorrhage, hydrocephalus, mass lesion, acute infarction, or
significant intracranial injury.

Vascular: No hyperdense vessel or unexpected calcification.

Skull: No acute calvarial abnormality.

Sinuses/Orbits: Visualized paranasal sinuses and mastoids clear.
Orbital soft tissues unremarkable.

Other: None
IMPRESSION: Normal study.

## 2019-07-18 ENCOUNTER — Encounter: Payer: Self-pay | Admitting: Internal Medicine

## 2019-07-18 ENCOUNTER — Telehealth: Payer: Self-pay | Admitting: Cardiovascular Disease

## 2019-07-18 NOTE — Telephone Encounter (Signed)
New message:     I called patient to get a F/U apt. She would like for you to call her back.

## 2019-07-18 NOTE — Telephone Encounter (Signed)
Spoke with patient and she is doing well, no issues  Message sent to Dr Oval Linsey to if follow up needed

## 2019-07-19 NOTE — Telephone Encounter (Signed)
Discussed with Dr Oval Linsey and no need to follow up Advised patient

## 2019-07-21 LAB — MICROALBUMIN / CREATININE URINE RATIO

## 2019-07-21 LAB — CBC WITH DIFFERENTIAL/PLATELET
Basophils Absolute: 0 10*3/uL (ref 0.0–0.2)
Basos: 1 %
EOS (ABSOLUTE): 0.3 10*3/uL (ref 0.0–0.4)
Eos: 4 %
Hematocrit: 39.6 % (ref 34.0–46.6)
Hemoglobin: 12.9 g/dL (ref 11.1–15.9)
Immature Grans (Abs): 0 10*3/uL (ref 0.0–0.1)
Immature Granulocytes: 0 %
Lymphocytes Absolute: 2.5 10*3/uL (ref 0.7–3.1)
Lymphs: 38 %
MCH: 26.3 pg — ABNORMAL LOW (ref 26.6–33.0)
MCHC: 32.6 g/dL (ref 31.5–35.7)
MCV: 81 fL (ref 79–97)
Monocytes Absolute: 0.4 10*3/uL (ref 0.1–0.9)
Monocytes: 7 %
Neutrophils Absolute: 3.4 10*3/uL (ref 1.4–7.0)
Neutrophils: 50 %
Platelets: 268 10*3/uL (ref 150–450)
RBC: 4.9 x10E6/uL (ref 3.77–5.28)
RDW: 14.3 % (ref 11.7–15.4)
WBC: 6.6 10*3/uL (ref 3.4–10.8)

## 2019-07-21 LAB — INSULIN, RANDOM

## 2019-07-21 LAB — SPECIMEN STATUS REPORT

## 2019-07-21 LAB — VITAMIN B1: Thiamine: 110.3 nmol/L (ref 66.5–200.0)

## 2019-07-21 LAB — VITAMIN D 25 HYDROXY (VIT D DEFICIENCY, FRACTURES)

## 2019-07-26 ENCOUNTER — Encounter (INDEPENDENT_AMBULATORY_CARE_PROVIDER_SITE_OTHER): Payer: Self-pay | Admitting: Bariatrics

## 2019-07-26 ENCOUNTER — Other Ambulatory Visit: Payer: Self-pay

## 2019-07-26 ENCOUNTER — Ambulatory Visit (INDEPENDENT_AMBULATORY_CARE_PROVIDER_SITE_OTHER): Payer: 59 | Admitting: Bariatrics

## 2019-07-26 VITALS — BP 122/84 | HR 74 | Temp 98.5°F | Ht 62.0 in | Wt 244.0 lb

## 2019-07-26 DIAGNOSIS — I10 Essential (primary) hypertension: Secondary | ICD-10-CM

## 2019-07-26 DIAGNOSIS — Z9884 Bariatric surgery status: Secondary | ICD-10-CM | POA: Diagnosis not present

## 2019-07-26 DIAGNOSIS — E119 Type 2 diabetes mellitus without complications: Secondary | ICD-10-CM

## 2019-07-26 DIAGNOSIS — Z6841 Body Mass Index (BMI) 40.0 and over, adult: Secondary | ICD-10-CM

## 2019-07-31 ENCOUNTER — Encounter (INDEPENDENT_AMBULATORY_CARE_PROVIDER_SITE_OTHER): Payer: Self-pay | Admitting: Bariatrics

## 2019-07-31 NOTE — Progress Notes (Signed)
Chief Complaint:   OBESITY [name] is here to discuss her progress with her obesity treatment plan along with follow-up of her obesity related diagnoses. [name] is on the Category 2 Plan and states she is following her eating plan approximately 75% of the time. [name] states she is exercising 0 minutes 0 times per week.  Today's visit was #: 2 Starting weight: 245 lbs Starting date: 07/12/2019 Today's weight: 244 lbs Today's date: 07/26/2019 Total lbs lost to date: 1 Total lbs lost since last in-office visit: 1  Interim History: [name] is down 1 lb. She states she likes the plan.  Subjective:   Type 2 diabetes mellitus without complication, without long-term current use of insulin ([place]). A1c is well controlled at 6.3. [name] is on no medication - diabetes is diet controlled.   Lab Results  Component Value Date   HGBA1C 6.3 05/11/2019   HGBA1C 6.2 04/26/2018   HGBA1C 5.6 12/16/2017   Lab Results  Component Value Date   MICROALBUR <0.7 05/11/2019   LDLCALC 105 (H) 05/11/2019   CREATININE 1.12 05/11/2019   Lab Results  Component Value Date   INSULIN CANCELED 07/12/2019   S/P laparoscopic sleeve gastrectomy. Jossette reports still having a sense of fullness. She is taking calcium, Vitamin D, and thiamine. Last thiamine level normal.  Essential hypertension. Blood pressure is reasonably well controlled. Timisha is taking Dyazide and Coreg.  BP Readings from Last 3 Encounters:  07/26/19 122/84  07/12/19 113/74  05/11/19 (!) 138/94   Lab Results  Component Value Date   CREATININE 1.12 05/11/2019   CREATININE 1.04 04/26/2018   CREATININE 1.16 02/17/2018   Assessment/Plan:   Type 2 diabetes mellitus without complication, without long-term current use of insulin ([place]). Good blood sugar control is important to decrease the likelihood of diabetic complications such as nephropathy, neuropathy, limb loss, blindness, coronary artery disease, and death. Intensive  lifestyle modification including diet, exercise and weight loss are the first line of treatment for diabetes. [name] will decrease carbohydrates, increase healthy fats and protein, and increase activity.  S/P laparoscopic sleeve gastrectomy. [name] will eat small frequent meals in accordance with the meal plan.  Essential hypertension. [name] is working on healthy weight loss and exercise to improve blood pressure control. We will watch for signs of hypotension as she continues her lifestyle modifications. She will continue her medications as directed.  Class 3 severe obesity with serious comorbidity and body mass index (BMI) of 40.0 to 44.9 in adult, unspecified obesity type ([place]).  [name] is currently in the action stage of change. As such, her goal is to continue with weight loss efforts. She has agreed to the Category 2 Plan with additional lunch and dinner options given.   We independently reviewed with the patient labs from 07/12/2019 including CBC and thiamine.  Handout was given on [employer].  Exercise goals: All adults should avoid inactivity. Some physical activity is better than none, and adults who participate in any amount of physical activity gain some health benefits.  Behavioral modification strategies: increasing lean protein intake, decreasing simple carbohydrates, increasing vegetables, increasing water intake, decreasing eating out, no skipping meals, meal planning and cooking strategies, keeping healthy foods in the home and planning for success.  [name] has agreed to follow-up with our clinic in 4 weeks. She was informed of the importance of frequent follow-up visits to maximize her success with intensive lifestyle modifications for her multiple health conditions.   Objective:   Blood pressure 122/84,  pulse 74, temperature 98.5 F (36.9 C), height 5\' 2"  (1.575 m), weight 244 lb (110.7 kg), SpO2 98 %. Body mass index is 44.63 kg/m.  General: Cooperative, alert, well  developed, in no acute distress. HEENT: Conjunctivae and lids unremarkable. Cardiovascular: Regular rhythm.  Lungs: Normal work of breathing. Neurologic: No focal deficits.   Lab Results  Component Value Date   CREATININE 1.12 05/11/2019   BUN 17 05/11/2019   NA 138 05/11/2019   K 4.3 05/11/2019   CL 106 05/11/2019   CO2 27 05/11/2019   Lab Results  Component Value Date   ALT 16 05/11/2019   AST 19 05/11/2019   ALKPHOS 255 (H) 05/11/2019   BILITOT 0.3 05/11/2019   Lab Results  Component Value Date   HGBA1C 6.3 05/11/2019   HGBA1C 6.2 04/26/2018   HGBA1C 5.6 12/16/2017   HGBA1C 5.9 05/10/2017   HGBA1C 5.5 02/26/2017   Lab Results  Component Value Date   INSULIN CANCELED 07/12/2019   Lab Results  Component Value Date   TSH 2.27 05/11/2019   Lab Results  Component Value Date   CHOL 179 05/11/2019   HDL 56.80 05/11/2019   LDLCALC 105 (H) 05/11/2019   TRIG 84.0 05/11/2019   CHOLHDL 3 05/11/2019   Lab Results  Component Value Date   WBC 6.6 07/12/2019   HGB 12.9 07/12/2019   HCT 39.6 07/12/2019   MCV 81 07/12/2019   PLT 268 07/12/2019   Lab Results  Component Value Date   IRON 74 05/15/2019   FERRITIN 137.0 05/15/2019   Attestation Statements:   Reviewed by clinician on day of visit: allergies, medications, problem list, medical history, surgical history, family history, social history, and previous encounter notes.  Time spent on visit including pre-visit chart review and post-visit charting and care was 32 minutes.   Migdalia Dk, am acting as Location manager for [employer], DO   I have reviewed the above documentation for accuracy and completeness, and I agree with the above. Jearld Lesch, DO

## 2019-08-10 ENCOUNTER — Ambulatory Visit (INDEPENDENT_AMBULATORY_CARE_PROVIDER_SITE_OTHER): Payer: Self-pay

## 2019-08-10 ENCOUNTER — Ambulatory Visit (INDEPENDENT_AMBULATORY_CARE_PROVIDER_SITE_OTHER): Payer: 59 | Admitting: Family Medicine

## 2019-08-10 ENCOUNTER — Encounter: Payer: Self-pay | Admitting: Internal Medicine

## 2019-08-10 ENCOUNTER — Ambulatory Visit (INDEPENDENT_AMBULATORY_CARE_PROVIDER_SITE_OTHER): Payer: 59 | Admitting: Internal Medicine

## 2019-08-10 ENCOUNTER — Encounter: Payer: Self-pay | Admitting: Family Medicine

## 2019-08-10 ENCOUNTER — Other Ambulatory Visit: Payer: Self-pay

## 2019-08-10 VITALS — BP 124/88 | HR 90 | Temp 98.2°F | Resp 16 | Ht 62.0 in | Wt 244.0 lb

## 2019-08-10 VITALS — BP 110/74 | HR 106 | Ht 62.0 in | Wt 244.0 lb

## 2019-08-10 DIAGNOSIS — E1165 Type 2 diabetes mellitus with hyperglycemia: Secondary | ICD-10-CM | POA: Diagnosis not present

## 2019-08-10 DIAGNOSIS — I10 Essential (primary) hypertension: Secondary | ICD-10-CM

## 2019-08-10 DIAGNOSIS — M8949 Other hypertrophic osteoarthropathy, multiple sites: Secondary | ICD-10-CM | POA: Diagnosis not present

## 2019-08-10 DIAGNOSIS — M170 Bilateral primary osteoarthritis of knee: Secondary | ICD-10-CM | POA: Diagnosis not present

## 2019-08-10 DIAGNOSIS — M25561 Pain in right knee: Secondary | ICD-10-CM

## 2019-08-10 DIAGNOSIS — M159 Polyosteoarthritis, unspecified: Secondary | ICD-10-CM | POA: Insufficient documentation

## 2019-08-10 DIAGNOSIS — M1811 Unilateral primary osteoarthritis of first carpometacarpal joint, right hand: Secondary | ICD-10-CM | POA: Insufficient documentation

## 2019-08-10 DIAGNOSIS — N1831 Chronic kidney disease, stage 3a: Secondary | ICD-10-CM

## 2019-08-10 LAB — BASIC METABOLIC PANEL
BUN: 27 mg/dL — ABNORMAL HIGH (ref 6–23)
CO2: 33 mEq/L — ABNORMAL HIGH (ref 19–32)
Calcium: 9.8 mg/dL (ref 8.4–10.5)
Chloride: 101 mEq/L (ref 96–112)
Creatinine, Ser: 1.35 mg/dL — ABNORMAL HIGH (ref 0.40–1.20)
GFR: 49.73 mL/min — ABNORMAL LOW (ref >60.00)
Glucose, Bld: 90 mg/dL (ref 70–99)
Potassium: 3.9 mEq/L (ref 3.5–5.1)
Sodium: 139 mEq/L (ref 135–145)

## 2019-08-10 LAB — HEMOGLOBIN A1C: Hgb A1c MFr Bld: 6.3 % (ref 4.6–6.5)

## 2019-08-10 MED ORDER — RELAFEN DS 1000 MG PO TABS: 2.0000 | ORAL_TABLET | Freq: Every day | ORAL | 0 refills | Status: DC | PRN

## 2019-08-10 NOTE — Progress Notes (Signed)
Subjective:  Patient ID: [name], female    DOB: 04-28-1965  Age: 54 y.o. MRN: [id number]  CC: Osteoarthritis and Hypertension  This visit occurred during the SARS-CoV-2 public health emergency.  Safety protocols were in place, including screening questions prior to the visit, additional usage of staff PPE, and extensive cleaning of exam room while observing appropriate contact time as indicated for disinfecting solutions.    HPI [name] presents for f/up - She complains of a 3 week history of nontraumatic pain in her right hand.  She has been seeing sports medicine for chronic knee pain.  She also has intermittent discomfort in her ankles.  None of her joints ever look red or swollen.  She is not taking anything for the discomfort.  Outpatient Medications Prior to Visit  Medication Sig Dispense Refill  . calcium carbonate (TUMS EX) 750 MG chewable tablet Chew 1,500 mg by mouth daily.    . carvedilol (COREG) 3.125 MG tablet Take 1 tablet (3.125 mg total) by mouth 2 (two) times daily with a meal. 180 tablet 1  . Cholecalciferol 1.25 MG (50000 UT) capsule Take 1 capsule (50,000 Units total) by mouth once a week. 12 capsule 0  . Multiple Vitamin (MULTIVITAMIN) tablet Take 1 tablet by mouth daily.    Marland Kitchen thiamine 100 MG tablet Take 1 tablet (100 mg total) by mouth every other day. 45 tablet 1  . triamterene-hydrochlorothiazide (DYAZIDE) 37.5-25 MG capsule Take 1 each (1 capsule total) by mouth daily. 90 capsule 0   No facility-administered medications prior to visit.    ROS Review of Systems  Constitutional: Negative.  Negative for appetite change, diaphoresis, fatigue and unexpected weight change.  HENT: Negative.   Eyes: Negative.   Respiratory: Negative for cough, chest tightness and shortness of breath.   Cardiovascular: Negative for chest pain and palpitations.  Gastrointestinal: Negative for abdominal pain, constipation, diarrhea, nausea and vomiting.  Endocrine:  Negative.   Genitourinary: Negative.  Negative for difficulty urinating and dysuria.  Musculoskeletal: Positive for arthralgias. Negative for back pain, myalgias and neck pain.  Skin: Negative.  Negative for color change and pallor.  Neurological: Negative.  Negative for dizziness, weakness, light-headedness and headaches.  Hematological: Negative for adenopathy. Does not bruise/bleed easily.  Psychiatric/Behavioral: Negative.     Objective:  BP 124/88   Pulse 90   Temp 98.2 F (36.8 C) (Oral)   Resp 16   Ht 5\' 2"  (1.575 m)   Wt 244 lb (110.7 kg)   SpO2 98%   BMI 44.63 kg/m   BP Readings from Last 3 Encounters:  08/10/19 124/88  08/10/19 110/74  07/26/19 122/84    Wt Readings from Last 3 Encounters:  08/10/19 244 lb (110.7 kg)  08/10/19 244 lb (110.7 kg)  07/26/19 244 lb (110.7 kg)    Physical Exam Vitals reviewed.  Constitutional:      Appearance: She is obese.  HENT:     Nose: Nose normal.     Mouth/Throat:     Mouth: Mucous membranes are moist.  Eyes:     General: No scleral icterus.    Conjunctiva/sclera: Conjunctivae normal.  Cardiovascular:     Rate and Rhythm: Normal rate and regular rhythm.     Heart sounds: No murmur.  Pulmonary:     Effort: Pulmonary effort is normal.     Breath sounds: No stridor. No wheezing, rhonchi or rales.  Abdominal:     General: Abdomen is protuberant. Bowel sounds are normal. There  is no distension.     Palpations: There is no hepatomegaly or splenomegaly.     Tenderness: There is no abdominal tenderness.  Musculoskeletal:        General: Normal range of motion.     Cervical back: Neck supple.     Right lower leg: No edema.     Left lower leg: No edema.     Comments: There are degenerative changes noted at the base of the right thumb in the [facility] joint.  There are also degenerative changes noted in the knees and ankles.  There is no joint swelling, erythema, or synovitis.  Lymphadenopathy:     Cervical: No cervical  adenopathy.  Skin:    General: Skin is warm and dry.     Coloration: Skin is not pale.  Neurological:     General: No focal deficit present.     Mental Status: She is alert.  Psychiatric:        Mood and Affect: Mood normal.        Behavior: Behavior normal.     Lab Results  Component Value Date   WBC 6.6 07/12/2019   HGB 12.9 07/12/2019   HCT 39.6 07/12/2019   PLT 268 07/12/2019   GLUCOSE 90 08/10/2019   CHOL 179 05/11/2019   TRIG 84.0 05/11/2019   HDL 56.80 05/11/2019   LDLCALC 105 (H) 05/11/2019   ALT 16 05/11/2019   AST 19 05/11/2019   NA 139 08/10/2019   K 3.9 08/10/2019   CL 101 08/10/2019   CREATININE 1.35 (H) 08/10/2019   BUN 27 (H) 08/10/2019   CO2 33 (H) 08/10/2019   TSH 2.27 05/11/2019   INR 1.0 05/25/2007   HGBA1C 6.3 08/10/2019   MICROALBUR <0.7 05/11/2019    CT Head Wo Contrast  Result Date: 12/20/2017 CLINICAL DATA:  Hypertension, headache EXAM: CT HEAD WITHOUT CONTRAST TECHNIQUE: Contiguous axial images were obtained from the base of the skull through the vertex without intravenous contrast. COMPARISON:  05/25/2007 FINDINGS: Brain: No acute intracranial abnormality. Specifically, no hemorrhage, hydrocephalus, mass lesion, acute infarction, or significant intracranial injury. Vascular: No hyperdense vessel or unexpected calcification. Skull: No acute calvarial abnormality. Sinuses/Orbits: Visualized paranasal sinuses and mastoids clear. Orbital soft tissues unremarkable. Other: None IMPRESSION: Normal study. Electronically Signed   By: Rolm Baptise M.D.   On: 12/20/2017 14:00    Assessment & Plan:   [name] was seen today for osteoarthritis and hypertension.  Diagnoses and all orders for this visit:  Primary osteoarthritis involving multiple joints -     Nabumetone (RELAFEN DS) 1000 MG TABS; Take 2 tablets by mouth daily as needed.  Arthritis of carpometacarpal (CMC) joint of right thumb -     Nabumetone (RELAFEN DS) 1000 MG TABS; Take 2 tablets by  mouth daily as needed.  Essential hypertension, benign- Her blood pressure is adequately well controlled but she has developed a prerenal azotemia on the combination of triamterene and hydrochlorothiazide.  I recommended that she taper her diuretic regimen down to a single agent with indapamide. -     Basic metabolic panel; Future -     Basic metabolic panel  Type 2 diabetes mellitus with hyperglycemia, without long-term current use of insulin ([place])- her blood sugars are well controlled. -     Basic metabolic panel; Future -     Hemoglobin A1c; Future -     Hemoglobin A1c -     Basic metabolic panel   I have discontinued Mrs. Cherina D. Winterrowd's triamterene-hydrochlorothiazide.  I am also having her start on Relafen DS and indapamide. Additionally, I am having her maintain her multivitamin, calcium carbonate, Cholecalciferol, thiamine, and carvedilol.  Meds ordered this encounter  Medications  . Nabumetone (RELAFEN DS) 1000 MG TABS    Sig: Take 2 tablets by mouth daily as needed.    Dispense:  12 tablet    Refill:  0  . indapamide (LOZOL) 1.25 MG tablet    Sig: Take 1 tablet (1.25 mg total) by mouth daily.    Dispense:  90 tablet    Refill:  0     Follow-up: Return in about 6 months (around 02/09/2020).  Scarlette Calico, MD

## 2019-08-10 NOTE — Patient Instructions (Signed)

## 2019-08-10 NOTE — Patient Instructions (Signed)
Good to see you.  Ice 20 minutes 2 times daily. Usually after activity and before bed. Exercises 3 times a week.  pennsaid pinkie amount topically 2 times daily as needed. Then Voltaren over the counter Once weekly vitamin D for 12 weeks.  Turmeric 500mg  daily  Tart cherry extract 1200mg  at night See me again in 6 weeks

## 2019-08-10 NOTE — Progress Notes (Signed)
[facility] [place] Phone: [phone] Subjective:   [name], am serving as a scribe for Dr. Hulan Saas. This visit occurred during the SARS-CoV-2 public health emergency.  Safety protocols were in place, including screening questions prior to the visit, additional usage of staff PPE, and extensive cleaning of exam room while observing appropriate contact time as indicated for disinfecting solutions.    I'm seeing this patient by the request  of:  Janith Lima, MD  CC: Right knee pain  [id number]  [name] is a 54 y.o. female coming in with complaint of bilateral knee pain. Noticed crunching her knee when going down stairs. Has since developed pain in anterior aspect of knee. Pain subsides when seated. Uses IBU prn. History of chronic venous insufficiency.  Does affect daily activities. Difficulty such as going up and down stairs is the worst thyroid and sometimes some discomfort at night as well. Does not know if there is any swelling.     Past Medical History:  Diagnosis Date  . Anemia   . Back pain   . Clotting disorder ([place])   . Diabetes mellitus without complication ([place])   . Edema 04/25/2015  . Headache(784.0)   . Herniated disc   . History of colonic polyps   . Hypertension   . Joint pain   . Obesity   . Osteoarthritis   . Shortness of breath 04/25/2015  . TIA (transient ischemic attack) 2007   hx of  . Vasculitis ([facility])    L leg - Polyarthritis  . Venous insufficiency   . Vitamin D deficiency    Past Surgical History:  Procedure Laterality Date  . ABDOMINAL HYSTERECTOMY    . LAPAROSCOPIC GASTRIC SLEEVE RESECTION N/A 08/11/2016   Procedure: LAPAROSCOPIC GASTRIC SLEEVE RESECTION, UPPER ENDOSCOPY;  Surgeon: Arta Bruce Kinsinger, MD;  Location: [facility];  Service: General;  Laterality: N/A;   Social History   Socioeconomic History  . Marital status: Married    Spouse name: Elberta Fortis    . Number of children: Not on file  . Years of education: Not on file  . Highest education level: Not on file  Occupational History  . 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 Smoking status: Never Smoker  . Smokeless tobacco: Never Used  Substance and Sexual Activity  . Alcohol use: Yes    Alcohol/week: 0.0 standard drinks    Comment: rarely  . Drug use: [name]  . Sexual activity: Yes    Birth control/protection: Surgical  Other Topics Concern  . Not on file  Social History Narrative  . Not on file   Social Determinants of Health   Financial Resource Strain:   . Difficulty of Paying Living Expenses:   Food Insecurity:   . Worried About Charity fundraiser in the Last Year:   . Arboriculturist in the Last Year:   Transportation Needs:   . Film/video editor (Medical):   Marland Kitchen Lack of Transportation (Non-Medical):   Physical Activity:   . Days of Exercise per Week:   . Minutes of Exercise per Session:   Stress:   . Feeling of Stress :   Social Connections:   . Frequency of Communication with Friends and Family:   . Frequency of Social Gatherings with Friends and Family:   . Attends Religious Services:   . Active Member of Clubs or Organizations:   . Attends Archivist Meetings:   .  Marital Status:    [name] Known Allergies Family History  Problem Relation Age of Onset  . Stroke Mother   . Hypertension Mother   . AAA (abdominal aortic aneurysm) Mother   . Hypertension Father   . Cancer Father        Prostate  . Arthritis Other   . Hypertension Other   . Stroke Other   . Colon cancer Neg Hx   . Esophageal cancer Neg Hx   . Rectal cancer Neg Hx   . Stomach cancer Neg Hx      Current Outpatient Medications (Cardiovascular):  .  carvedilol (COREG) 3.125 MG tablet, Take 1 tablet (3.125 mg total) by mouth 2 (two) times daily with a meal. .  triamterene-hydrochlorothiazide (DYAZIDE) 37.5-25 MG capsule, Take 1 each (1 capsule total) by mouth  daily.   Current Outpatient Medications (Analgesics):  Marland Kitchen  Nabumetone (RELAFEN DS) 1000 MG TABS, Take 2 tablets by mouth daily as needed.   Current Outpatient Medications (Other):  .  calcium carbonate (TUMS EX) 750 MG chewable tablet, Chew 1,500 mg by mouth daily. .  Cholecalciferol 1.25 MG (50000 UT) capsule, Take 1 capsule (50,000 Units total) by mouth once a week. .  Multiple Vitamin (MULTIVITAMIN) tablet, Take 1 tablet by mouth daily. Marland Kitchen  thiamine 100 MG tablet, Take 1 tablet (100 mg total) by mouth every other day.   Reviewed prior external information including notes and imaging from  primary care provider As well as notes that were available from care everywhere and other healthcare systems.  Past medical history, social, surgical and family history all reviewed in electronic medical record.  [name] pertanent information unless stated regarding to the chief complaint.   Review of Systems:  [name] headache, visual changes, nausea, vomiting, diarrhea, constipation, dizziness, abdominal pain, skin rash, fevers, chills, night sweats, weight loss, swollen lymph nodes, body aches,chest pain, shortness of breath, mood changes. POSITIVE muscle aches, joint swelling  Objective  Blood pressure 110/74, pulse (!) 106, height 5\' 2"  (1.575 m), weight 244 lb (110.7 kg), SpO2 98 %.   General: [name] apparent distress alert and oriented x3 mood and affect normal, dressed appropriately. Morbid obesity HEENT: Pupils equal, extraocular movements intact  Respiratory: Patient's speak in full sentences and does not appear short of breath  Cardiovascular: [name] lower extremity edema, non tender, [name] erythema  Neuro: Cranial nerves II through XII are intact, neurovascularly intact in all extremities with 2+ DTRs and 2+ pulses.  Gait normal with good balance and coordination.  MSK:  tender with full range of motion and good stability and symmetric strength and tone of shoulders, elbows, wrist, hip, and ankles  bilaterally.     Bilateral knee pain is shows the patient does have arthritic changes of the knees bilaterally. Patient does have abnormal thigh to calf ratio. [name] significant bone weakness of the knee noted. Tender to palpation mostly over the medial joint space in the patellofemoral joint space. Lacks the last 5 of flexion bilaterally.   Impression and Recommendations:     . The above documentation has been reviewed and is accurate and complete Natalie Pulley, DO       Note: This dictation was prepared with Dragon dictation along with smaller phrase technology. Any transcriptional errors that result from this process are unintentional.

## 2019-08-10 NOTE — Assessment & Plan Note (Signed)
Bilateral knee pain likely arthritic changes. X-rays pending. Oral anti-inflammatories have been given by primary care provider but encouraged potential trial of topical anti-inflammatories. Discussed the importance of weight loss, discussed icing regimen. Home exercises given. Follow-up again in 6 weeks worsening pain will consider formal physical therapy and potential injections.

## 2019-08-11 ENCOUNTER — Encounter: Payer: Self-pay | Admitting: Internal Medicine

## 2019-08-11 DIAGNOSIS — N1831 Chronic kidney disease, stage 3a: Secondary | ICD-10-CM | POA: Insufficient documentation

## 2019-08-11 MED ORDER — INDAPAMIDE 1.25 MG PO TABS: 1.2500 mg | ORAL_TABLET | Freq: Every day | ORAL | 0 refills | Status: DC

## 2019-08-11 MED FILL — INDAPAMIDE 1.25 MG TABLET: 1.25 | 90 days supply | Qty: 90 | Fill #0

## 2019-08-16 MED FILL — CARVEDILOL 3.125 MG TABLET: 3.125 | 90 days supply | Qty: 180 | Fill #1

## 2019-08-17 ENCOUNTER — Encounter: Payer: Self-pay | Admitting: Family Medicine

## 2019-08-17 ENCOUNTER — Other Ambulatory Visit: Payer: Self-pay

## 2019-08-17 MED ORDER — VITAMIN D (ERGOCALCIFEROL) 1.25 MG (50000 UNIT) PO CAPS: 50000.0000 [IU] | ORAL_CAPSULE | ORAL | 0 refills | Status: DC

## 2019-08-17 MED FILL — VIT D2 1.25 MG (50,000 UNIT: 1.25 MG | 28 days supply | Qty: 4 | Fill #0

## 2019-08-23 ENCOUNTER — Encounter: Payer: Self-pay | Admitting: Internal Medicine

## 2019-08-24 ENCOUNTER — Ambulatory Visit (INDEPENDENT_AMBULATORY_CARE_PROVIDER_SITE_OTHER): Payer: 59 | Admitting: Bariatrics

## 2019-08-31 ENCOUNTER — Encounter (INDEPENDENT_AMBULATORY_CARE_PROVIDER_SITE_OTHER): Payer: Self-pay | Admitting: Bariatrics

## 2019-08-31 ENCOUNTER — Other Ambulatory Visit: Payer: Self-pay

## 2019-08-31 ENCOUNTER — Ambulatory Visit (INDEPENDENT_AMBULATORY_CARE_PROVIDER_SITE_OTHER): Payer: 59 | Admitting: Bariatrics

## 2019-08-31 VITALS — BP 116/82 | HR 80 | Temp 98.5°F | Ht 62.0 in | Wt 245.0 lb

## 2019-08-31 DIAGNOSIS — F3289 Other specified depressive episodes: Secondary | ICD-10-CM

## 2019-08-31 DIAGNOSIS — E1169 Type 2 diabetes mellitus with other specified complication: Secondary | ICD-10-CM

## 2019-08-31 DIAGNOSIS — Z6841 Body Mass Index (BMI) 40.0 and over, adult: Secondary | ICD-10-CM

## 2019-08-31 DIAGNOSIS — I10 Essential (primary) hypertension: Secondary | ICD-10-CM

## 2019-08-31 DIAGNOSIS — E669 Obesity, unspecified: Secondary | ICD-10-CM

## 2019-08-31 DIAGNOSIS — Z9189 Other specified personal risk factors, not elsewhere classified: Secondary | ICD-10-CM

## 2019-08-31 MED ORDER — BUPROPION HCL ER (SR) 150 MG PO TB12: 150.0000 mg | ORAL_TABLET | Freq: Every day | ORAL | 0 refills | Status: DC

## 2019-08-31 MED FILL — BUPROPION HCL SR 150 MG TAB: 150 | 30 days supply | Qty: 30 | Fill #0

## 2019-09-04 ENCOUNTER — Encounter (INDEPENDENT_AMBULATORY_CARE_PROVIDER_SITE_OTHER): Payer: Self-pay | Admitting: Bariatrics

## 2019-09-04 NOTE — Progress Notes (Signed)
Chief Complaint:   OBESITY [name] is here to discuss her progress with her obesity treatment plan along with follow-up of her obesity related diagnoses. [name] is on the Category 2 Plan and states she is following her eating plan approximately 30% of the time. [name] states she is exercising 0 minutes 0 times per week.  Today's visit was #: 3 Starting weight: 245 lbs Starting date: 07/12/2019 Today's weight: 245 lbs Today's date: 08/31/2019 Total lbs lost to date: 0 Total lbs lost since last in-office visit: 0  Interim History: [name] is up 1 lb.  Subjective:   Type 2 diabetes mellitus with obesity ([name]). Diabetes mellitus is well controlled. [name] is on no medications.  Lab Results  Component Value Date   HGBA1C 6.3 08/10/2019   HGBA1C 6.3 05/11/2019   HGBA1C 6.2 04/26/2018   Lab Results  Component Value Date   MICROALBUR <0.7 05/11/2019   LDLCALC 105 (H) 05/11/2019   CREATININE 1.35 (H) 08/10/2019   Lab Results  Component Value Date   INSULIN CANCELED 07/12/2019   Essential hypertension. Blood pressure is controlled.  BP Readings from Last 3 Encounters:  08/31/19 116/82  08/10/19 124/88  08/10/19 110/74   Lab Results  Component Value Date   CREATININE 1.35 (H) 08/10/2019   CREATININE 1.12 05/11/2019   CREATININE 1.04 04/26/2018   Other depression, emotional eating. [name] is struggling with emotional eating and using food for comfort to the extent that it is negatively impacting her health. She has been working on behavior modification techniques to help reduce her emotional eating and has been somewhat successful. She shows no sign of suicidal or homicidal ideations.  At risk for heart disease. [name] is at a higher than average risk for cardiovascular disease due to hypertension and obesity.   Assessment/Plan:   Type 2 diabetes mellitus with obesity ([name]). Good blood sugar control is important to decrease the likelihood of diabetic  complications such as nephropathy, neuropathy, limb loss, blindness, coronary artery disease, and death. Intensive lifestyle modification including diet, exercise and weight loss are the first line of treatment for diabetes. Sanaai will decrease carbohydrates and increase activity.  Essential hypertension. [name] is working on healthy weight loss and exercise to improve blood pressure control. We will watch for signs of hypotension as she continues her lifestyle modifications. She will continue her medication as directed.  Other depression, emotional eating. Behavior modification techniques were discussed today to help [name] deal with her emotional/non-hunger eating behaviors.  Orders and follow up as documented in patient record. Prescription was given for buPROPion (WELLBUTRIN SR) 150 MG 12 hr tablet 1 PO daily in a.m. #30 with 0 refills.  At risk for heart disease. [name] was given approximately 15 minutes of coronary artery disease prevention counseling today. She is 54 y.o. female and has risk factors for heart disease including obesity. We discussed intensive lifestyle modifications today with an emphasis on specific weight loss instructions and strategies.   Repetitive spaced learning was employed today to elicit superior memory formation and behavioral change.  Class 3 severe obesity with serious comorbidity and body mass index (BMI) of 40.0 to 44.9 in adult, unspecified obesity type ([name]).  [name] is currently in the action stage of change. As such, her goal is to continue with weight loss efforts. She has agreed to the Category 2 Plan with additional lunch and dinner options.  She will work on meal planning and intentional eating.   Exercise goals: All adults should avoid  inactivity. Some physical activity is better than none, and adults who participate in any amount of physical activity gain some health benefits.  Behavioral modification strategies: increasing lean protein intake,  decreasing simple carbohydrates, increasing vegetables, increasing water intake, decreasing eating out, no skipping meals, meal planning and cooking strategies and keeping healthy foods in the home.  [name] has agreed to follow-up with our clinic in 2 weeks. She was informed of the importance of frequent follow-up visits to maximize her success with intensive lifestyle modifications for her multiple health conditions.   Objective:   Blood pressure 116/82, pulse 80, temperature 98.5 F (36.9 C), height 5\' 2"  (1.575 m), weight 245 lb (111.1 kg), SpO2 100 %. Body mass index is 44.81 kg/m.  General: Cooperative, alert, well developed, in no acute distress. HEENT: Conjunctivae and lids unremarkable. Cardiovascular: Regular rhythm.  Lungs: Normal work of breathing. Neurologic: No focal deficits.   Lab Results  Component Value Date   CREATININE 1.35 (H) 08/10/2019   BUN 27 (H) 08/10/2019   NA 139 08/10/2019   K 3.9 08/10/2019   CL 101 08/10/2019   CO2 33 (H) 08/10/2019   Lab Results  Component Value Date   ALT 16 05/11/2019   AST 19 05/11/2019   ALKPHOS 255 (H) 05/11/2019   BILITOT 0.3 05/11/2019   Lab Results  Component Value Date   HGBA1C 6.3 08/10/2019   HGBA1C 6.3 05/11/2019   HGBA1C 6.2 04/26/2018   HGBA1C 5.6 12/16/2017   HGBA1C 5.9 05/10/2017   Lab Results  Component Value Date   INSULIN CANCELED 07/12/2019   Lab Results  Component Value Date   TSH 2.27 05/11/2019   Lab Results  Component Value Date   CHOL 179 05/11/2019   HDL 56.80 05/11/2019   LDLCALC 105 (H) 05/11/2019   TRIG 84.0 05/11/2019   CHOLHDL 3 05/11/2019   Lab Results  Component Value Date   WBC 6.6 07/12/2019   HGB 12.9 07/12/2019   HCT 39.6 07/12/2019   MCV 81 07/12/2019   PLT 268 07/12/2019   Lab Results  Component Value Date   IRON 74 05/15/2019   FERRITIN 137.0 05/15/2019   Attestation Statements:   Reviewed by clinician on day of visit: allergies, medications, problem list,  medical history, surgical history, family history, social history, and previous encounter notes.  Migdalia Dk, am acting as Location manager for [employer], DO   I have reviewed the above documentation for accuracy and completeness, and I agree with the above. Jearld Lesch, DO

## 2019-09-21 ENCOUNTER — Ambulatory Visit: Payer: Self-pay | Admitting: Family Medicine

## 2019-09-28 ENCOUNTER — Ambulatory Visit (INDEPENDENT_AMBULATORY_CARE_PROVIDER_SITE_OTHER): Payer: 59 | Admitting: Bariatrics

## 2019-10-12 ENCOUNTER — Encounter (INDEPENDENT_AMBULATORY_CARE_PROVIDER_SITE_OTHER): Payer: Self-pay

## 2019-10-12 ENCOUNTER — Ambulatory Visit (INDEPENDENT_AMBULATORY_CARE_PROVIDER_SITE_OTHER): Payer: 59 | Admitting: Bariatrics

## 2019-10-17 MED FILL — VIT D2 1.25 MG (50,000 UNIT: 1.25 MG | 28 days supply | Qty: 4 | Fill #1

## 2019-10-30 NOTE — Progress Notes (Deleted)
[place] Phone: [phone] Subjective:    I'm seeing this patient by the request  of:  Janith Lima, MD  CC:   [id number]  [name] is a 54 y.o. female coming in with complaint of ***  Onset-  Location Duration-  Character- Aggravating factors- Reliving factors-  Therapies tried-  Severity-     Past Medical History:  Diagnosis Date  . Anemia   . Back pain   . Clotting disorder ([facility])   . Diabetes mellitus without complication ([facility])   . Edema 04/25/2015  . Headache(784.0)   . Herniated disc   . History of colonic polyps   . Hypertension   . Joint pain   . Obesity   . Osteoarthritis   . Shortness of breath 04/25/2015  . TIA (transient ischemic attack) 2007   hx of  . Vasculitis ([facility])    L leg - Polyarthritis  . Venous insufficiency   . Vitamin D deficiency    Past Surgical History:  Procedure Laterality Date  . ABDOMINAL HYSTERECTOMY    . LAPAROSCOPIC GASTRIC SLEEVE RESECTION N/A 08/11/2016   Procedure: LAPAROSCOPIC GASTRIC SLEEVE RESECTION, UPPER ENDOSCOPY;  Surgeon: Arta Bruce Kinsinger, MD;  Location: [facility];  Service: General;  Laterality: N/A;   Social History   Socioeconomic History  . Marital status: Married    Spouse name: Elberta Fortis  . Number of children: Not on file  . Years of education: Not on file  . Highest education level: Not on file  Occupational History  . 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 Smoking status: Never Smoker  . Smokeless tobacco: Never Used  Vaping Use  . Vaping Use: Never used  Substance and Sexual Activity  . Alcohol use: Yes    Alcohol/week: 0.0 standard drinks    Comment: rarely  . Drug use: No  . Sexual activity: Yes    Birth control/protection: Surgical  Other Topics Concern  . Not on file  Social History Narrative  . Not on file   Social Determinants of Health   Financial Resource Strain:   . Difficulty of  Paying Living Expenses:   Food Insecurity:   . Worried About Charity fundraiser in the Last Year:   . Arboriculturist in the Last Year:   Transportation Needs:   . Film/video editor (Medical):   Marland Kitchen Lack of Transportation (Non-Medical):   Physical Activity:   . Days of Exercise per Week:   . Minutes of Exercise per Session:   Stress:   . Feeling of Stress :   Social Connections:   . Frequency of Communication with Friends and Family:   . Frequency of Social Gatherings with Friends and Family:   . Attends Religious Services:   . Active Member of Clubs or Organizations:   . Attends Archivist Meetings:   Marland Kitchen Marital Status:    No Known Allergies Family History  Problem Relation Age of Onset  . Stroke Mother   . Hypertension Mother   . AAA (abdominal aortic aneurysm) Mother   . Hypertension Father   . Cancer Father        Prostate  . Arthritis Other   . Hypertension Other   . Stroke Other   . Colon cancer Neg Hx   . Esophageal cancer Neg Hx   . Rectal cancer Neg Hx   . Stomach cancer Neg Hx  Current Outpatient Medications (Cardiovascular):  .  carvedilol (COREG) 3.125 MG tablet, Take 1 tablet (3.125 mg total) by mouth 2 (two) times daily with a meal. .  indapamide (LOZOL) 1.25 MG tablet, Take 1 tablet (1.25 mg total) by mouth daily.   Current Outpatient Medications (Analgesics):  Marland Kitchen  Nabumetone (RELAFEN DS) 1000 MG TABS, Take 2 tablets by mouth daily as needed.   Current Outpatient Medications (Other):  Marland Kitchen  buPROPion (WELLBUTRIN SR) 150 MG 12 hr tablet, Take 1 tablet (150 mg total) by mouth daily. .  calcium carbonate (TUMS EX) 750 MG chewable tablet, Chew 1,500 mg by mouth daily. .  Cholecalciferol 1.25 MG (50000 UT) capsule, Take 1 capsule (50,000 Units total) by mouth once a week. .  Multiple Vitamin (MULTIVITAMIN) tablet, Take 1 tablet by mouth daily. Marland Kitchen  thiamine 100 MG tablet, Take 1 tablet (100 mg total) by mouth every other day. .  Vitamin D,  Ergocalciferol, (DRISDOL) 1.25 MG (50000 UNIT) CAPS capsule, Take 1 capsule (50,000 Units total) by mouth every 7 (seven) days.   Reviewed prior external information including notes and imaging from  primary care provider As well as notes that were available from care everywhere and other healthcare systems.  Past medical history, social, surgical and family history all reviewed in electronic medical record.  No pertanent information unless stated regarding to the chief complaint.   Review of Systems:  No headache, visual changes, nausea, vomiting, diarrhea, constipation, dizziness, abdominal pain, skin rash, fevers, chills, night sweats, weight loss, swollen lymph nodes, body aches, joint swelling, chest pain, shortness of breath, mood changes. POSITIVE muscle aches  Objective  There were no vitals taken for this visit.   General: No apparent distress alert and oriented x3 mood and affect normal, dressed appropriately.  HEENT: Pupils equal, extraocular movements intact  Respiratory: Patient's speak in full sentences and does not appear short of breath  Cardiovascular: No lower extremity edema, non tender, no erythema  Neuro: Cranial nerves II through XII are intact, neurovascularly intact in all extremities with 2+ DTRs and 2+ pulses.  Gait normal with good balance and coordination.  MSK:  Non tender with full range of motion and good stability and symmetric strength and tone of shoulders, elbows, wrist, hip, knee and ankles bilaterally.     Impression and Recommendations:     The above documentation has been reviewed and is accurate and complete Lyndal Pulley, DO       Note: This dictation was prepared with Dragon dictation along with smaller phrase technology. Any transcriptional errors that result from this process are unintentional.

## 2019-10-31 ENCOUNTER — Ambulatory Visit: Payer: Self-pay | Admitting: Family Medicine

## 2019-11-16 ENCOUNTER — Telehealth: Payer: Self-pay | Admitting: Cardiovascular Disease

## 2019-11-16 NOTE — Telephone Encounter (Signed)
Patient is requesting to speak with Dr. Blenda Mounts nurse. She states she has a question for her. Declined to further discuss inquiry. Please call.

## 2019-11-16 NOTE — Telephone Encounter (Signed)
Called and spoke with pt, she states she just had a question for [facility] and it wasn't anything urgent. States Rip Harbour can call her back when she gets the chance.

## 2019-11-16 NOTE — Telephone Encounter (Signed)
Left message to call back  

## 2019-11-28 ENCOUNTER — Encounter: Payer: Self-pay | Admitting: Internal Medicine

## 2019-11-29 ENCOUNTER — Ambulatory Visit (INDEPENDENT_AMBULATORY_CARE_PROVIDER_SITE_OTHER): Payer: 59 | Admitting: Internal Medicine

## 2019-11-29 ENCOUNTER — Other Ambulatory Visit: Payer: Self-pay | Admitting: Internal Medicine

## 2019-11-29 ENCOUNTER — Ambulatory Visit (INDEPENDENT_AMBULATORY_CARE_PROVIDER_SITE_OTHER): Payer: 59

## 2019-11-29 ENCOUNTER — Encounter: Payer: Self-pay | Admitting: Internal Medicine

## 2019-11-29 ENCOUNTER — Other Ambulatory Visit: Payer: Self-pay

## 2019-11-29 VITALS — BP 124/80 | HR 79 | Temp 98.2°F | Resp 16 | Ht 62.0 in | Wt 257.1 lb

## 2019-11-29 DIAGNOSIS — Z23 Encounter for immunization: Secondary | ICD-10-CM | POA: Diagnosis not present

## 2019-11-29 DIAGNOSIS — Z9884 Bariatric surgery status: Secondary | ICD-10-CM | POA: Diagnosis not present

## 2019-11-29 DIAGNOSIS — E1165 Type 2 diabetes mellitus with hyperglycemia: Secondary | ICD-10-CM

## 2019-11-29 DIAGNOSIS — E559 Vitamin D deficiency, unspecified: Secondary | ICD-10-CM

## 2019-11-29 DIAGNOSIS — M546 Pain in thoracic spine: Secondary | ICD-10-CM | POA: Diagnosis not present

## 2019-11-29 DIAGNOSIS — I10 Essential (primary) hypertension: Secondary | ICD-10-CM | POA: Diagnosis not present

## 2019-11-29 DIAGNOSIS — M5134 Other intervertebral disc degeneration, thoracic region: Secondary | ICD-10-CM

## 2019-11-29 LAB — BASIC METABOLIC PANEL WITH GFR
BUN/Creatinine Ratio: 16 (calc) (ref 6–22)
BUN: 18 mg/dL (ref 7–25)
CO2: 30 mmol/L (ref 20–32)
Calcium: 9.4 mg/dL (ref 8.6–10.4)
Chloride: 101 mmol/L (ref 98–110)
Creat: 1.14 mg/dL — ABNORMAL HIGH (ref 0.50–1.05)
GFR, Est African American: 64 mL/min/{1.73_m2} (ref >=60)
GFR, Est Non African American: 55 mL/min/{1.73_m2} — ABNORMAL LOW (ref >=60)
Glucose, Bld: 90 mg/dL (ref 65–99)
Potassium: 4.1 mmol/L (ref 3.5–5.3)
Sodium: 139 mmol/L (ref 135–146)

## 2019-11-29 LAB — VITAMIN D 25 HYDROXY (VIT D DEFICIENCY, FRACTURES): Vit D, 25-Hydroxy: 26 ng/mL — ABNORMAL LOW (ref 30–100)

## 2019-11-29 MED ORDER — CHOLECALCIFEROL 1.25 MG (50000 UT) PO CAPS: 50000.0000 [IU] | ORAL_CAPSULE | ORAL | 0 refills | Status: DC

## 2019-11-29 MED ORDER — INDAPAMIDE 1.25 MG PO TABS: 1.2500 mg | ORAL_TABLET | Freq: Every day | ORAL | 0 refills | Status: DC

## 2019-11-29 MED ORDER — CARVEDILOL 3.125 MG PO TABS: 3.1250 mg | ORAL_TABLET | Freq: Two times a day (BID) | ORAL | 1 refills | Status: DC

## 2019-11-29 MED ORDER — OXYCODONE HCL 5 MG PO TABS: 5.0000 mg | ORAL_TABLET | ORAL | 0 refills | Status: DC | PRN

## 2019-11-29 MED FILL — oxyCODONE HCL 5 MG TABS: 5 | 5 days supply | Qty: 30 | Fill #0

## 2019-11-29 MED FILL — INDAPAMIDE 1.25 MG TABS: 1.25 | 90 days supply | Qty: 90 | Fill #0

## 2019-11-29 MED FILL — VITAMIN D3 50,000 UNITS CAP: 1.25 MG | 84 days supply | Qty: 12 | Fill #0

## 2019-11-29 MED FILL — CARVEDILOL 3.125 MG TABLET: 3.125 | 90 days supply | Qty: 180 | Fill #0

## 2019-11-29 NOTE — Patient Instructions (Signed)
Thoracic Strain A thoracic strain is an injury to the muscles or tendons that attach to the upper back. Tendons are tissues that connect muscle to bone. This injury is sometimes called a mid-back strain. A strain can be mild or very bad. A mild strain may take only 1-2 weeks to heal. A very bad strain involves torn muscles or tendons, so it may take 6-8 weeks to heal. What are the causes? This condition may be caused by:  A fall or a hit to the body.  Twisting or stretching the back too far. This may happen when doing activities that require a lot of energy, such as lifting heavy objects. In some cases, the cause may not be known. What increases the risk? This injury is more common in:  Athletes.  People who are very overweight (obese). What are the signs or symptoms?  Pain in the middle back, especially with movement. This is the main symptom.  Stiffness or limited range of motion.  Sudden muscle tightening (spasms). How is this treated? This condition may be treated with:  Resting the injured area.  Putting heat and cold on the injured area.  Medicines for pain and inflammation, such as NSAIDs.  Prescription medicine for pain or to relax the muscles. These may be used for a short time if needed.  Physical therapy. This will involve doing exercises to stretch and strengthen the middle back. Follow these instructions at home: Managing pain, stiffness, and swelling      If told, put ice on the injured area. ? Put ice in a plastic bag. ? Place a towel between your skin and the bag. ? Leave the ice on for 20 minutes, 2-3 times a day.  If told, put heat on the affected area. Do this as often as told by your doctor. Use the heat source that your doctor recommends, such as a moist heat pack or a heating pad. ? Place a towel between your skin and the heat source. ? Leave the heat on for 20-30 minutes. ? Remove the heat if your skin turns bright red. This is very important if  you are unable to feel pain, heat, or cold. You may have a greater risk of getting burned. Activity  Rest and return to your normal activities as told by your doctor. Ask your doctor what activities are safe for you.  Do exercises as told by your doctor. Medicines  Take over-the-counter and prescription medicines only as told by your doctor.  Ask your doctor if the medicine prescribed to you: ? Requires you to avoid driving or using heavy machinery. ? Can cause trouble pooping (constipation). You may need to take steps to prevent or treat trouble pooping:  Drink enough fluid to keep your pee (urine) pale yellow.  Take over-the-counter or prescription medicines.  Eat foods that are high in fiber. These include beans, whole grains, and fresh fruits and vegetables.  Limit foods that are high in fat and processed sugars. These include fried or sweet foods. Injury prevention To prevent a future mid-back injury:  Always warm up before physical activity or sports.  Cool down and stretch after being active.  Use correct form when playing sports and lifting heavy objects. Bend your knees before you lift heavy objects.  Use good posture when sitting and standing.  Stay physically fit and maintain a healthy weight. ? Do at least 150 minutes of moderate-intensity exercise each week, such as brisk walking or water aerobics. ? Do strength exercises  at least 2 times each week.  General instructions  Do not use any products that contain nicotine or tobacco. These products include cigarettes, e-cigarettes, and chewing tobacco. If you need help quitting, ask your doctor.  Keep all follow-up visits as told by your doctor. This is important. Contact a doctor if:  Your pain is not helped by medicine.  Your pain or stiffness is getting worse.  You have pain or stiffness in your neck or lower back. Get help right away if you:  Have shortness of breath.  Have chest pain.  Have weakness  or loss of feeling (numbness) in your legs or arms.  Cannot control when you pee (urinate). Summary  A thoracic strain is an injury to the muscles or tendons that attach to the upper back.  If told, put ice or heat on the affected area.  Rest and return to your normal activities as told by your doctor.  Keep all follow-up visits as told by your doctor. This information is not intended to replace advice given to you by your health care provider. Make sure you discuss any questions you have with your health care provider. Document Revised: 02/15/2018 Document Reviewed: 02/15/2018 Elsevier Patient Education  2020 [employer].

## 2019-11-29 NOTE — Progress Notes (Signed)
Subjective:  Patient ID: [name], female    DOB: 05/26/1965  Age: 54 y.o. MRN: [id number]  CC: Hypertension and Back Pain  This visit occurred during the SARS-CoV-2 public health emergency.  Safety protocols were in place, including screening questions prior to the visit, additional usage of staff PPE, and extensive cleaning of exam room while observing appropriate contact time as indicated for disinfecting solutions.    HPI [name] presents for f/up - 3 days ago she turned to the left and felt the acute onset of pain in her back just to the left side of the thoracic spine.  She is not gotten much symptom relief with Tylenol, Motrin, and a heating pad.  She says the pain is interfering with her ability to sleep and get comfortable.  Outpatient Medications Prior to Visit  Medication Sig Dispense Refill  . calcium carbonate (TUMS EX) 750 MG chewable tablet Chew 1,500 mg by mouth daily.    . Multiple Vitamin (MULTIVITAMIN) tablet Take 1 tablet by mouth daily.    Marland Kitchen thiamine 100 MG tablet Take 1 tablet (100 mg total) by mouth every other day. 45 tablet 1  . buPROPion (WELLBUTRIN SR) 150 MG 12 hr tablet Take 1 tablet (150 mg total) by mouth daily. 30 tablet 0  . carvedilol (COREG) 3.125 MG tablet Take 1 tablet (3.125 mg total) by mouth 2 (two) times daily with a meal. 180 tablet 1  . Cholecalciferol 1.25 MG (50000 UT) capsule Take 1 capsule (50,000 Units total) by mouth once a week. 12 capsule 0  . indapamide (LOZOL) 1.25 MG tablet Take 1 tablet (1.25 mg total) by mouth daily. 90 tablet 0  . Nabumetone (RELAFEN DS) 1000 MG TABS Take 2 tablets by mouth daily as needed. 12 tablet 0  . Vitamin D, Ergocalciferol, (DRISDOL) 1.25 MG (50000 UNIT) CAPS capsule Take 1 capsule (50,000 Units total) by mouth every 7 (seven) days. 12 capsule 0   No facility-administered medications prior to visit.    ROS Review of Systems  Constitutional: Negative for appetite change, chills,  diaphoresis and fatigue.  HENT: Negative.   Eyes: Negative.   Respiratory: Negative for cough, chest tightness, shortness of breath and wheezing.   Cardiovascular: Negative for chest pain, palpitations and leg swelling.  Gastrointestinal: Negative for abdominal pain, constipation, diarrhea, nausea and vomiting.  Endocrine: Negative.   Genitourinary: Negative.  Negative for difficulty urinating, dysuria and hematuria.  Musculoskeletal: Positive for back pain. Negative for arthralgias, myalgias and neck pain.  Skin: Negative.  Negative for color change, pallor and rash.  Neurological: Negative.  Negative for dizziness, weakness, light-headedness, numbness and headaches.  Hematological: Negative for adenopathy. Does not bruise/bleed easily.  Psychiatric/Behavioral: Negative.     Objective:  BP 124/80 (BP Location: Left Arm, Patient Position: Sitting, Cuff Size: Large)   Pulse 79   Temp 98.2 F (36.8 C) (Oral)   Resp 16   Ht 5\' 2"  (1.575 m)   Wt 257 lb 2 oz (116.6 kg)   SpO2 98%   BMI 47.03 kg/m   BP Readings from Last 3 Encounters:  11/29/19 124/80  08/31/19 116/82  08/10/19 124/88    Wt Readings from Last 3 Encounters:  11/29/19 257 lb 2 oz (116.6 kg)  08/31/19 245 lb (111.1 kg)  08/10/19 244 lb (110.7 kg)    Physical Exam Vitals reviewed.  HENT:     Nose: Nose normal.     Mouth/Throat:     Mouth: Mucous membranes are  moist.  Eyes:     General: No scleral icterus.    Conjunctiva/sclera: Conjunctivae normal.  Cardiovascular:     Rate and Rhythm: Normal rate and regular rhythm.     Heart sounds: No murmur heard.   Pulmonary:     Effort: Pulmonary effort is normal.     Breath sounds: No stridor. No wheezing, rhonchi or rales.  Abdominal:     General: Abdomen is protuberant. Bowel sounds are normal. There is no distension.     Palpations: Abdomen is soft. There is no hepatomegaly, splenomegaly or mass.  Musculoskeletal:        General: Normal range of motion.      Cervical back: Normal and neck supple.     Thoracic back: Normal. No swelling, edema, deformity, signs of trauma, tenderness or bony tenderness. Normal range of motion.     Lumbar back: Normal.     Right lower leg: No edema.     Left lower leg: No edema.  Lymphadenopathy:     Cervical: No cervical adenopathy.  Skin:    General: Skin is warm and dry.  Neurological:     General: No focal deficit present.     Mental Status: She is alert and oriented to person, place, and time.  Psychiatric:        Mood and Affect: Mood normal.        Behavior: Behavior normal.     Lab Results  Component Value Date   WBC 6.6 07/12/2019   HGB 12.9 07/12/2019   HCT 39.6 07/12/2019   PLT 268 07/12/2019   GLUCOSE 90 11/29/2019   CHOL 179 05/11/2019   TRIG 84.0 05/11/2019   HDL 56.80 05/11/2019   LDLCALC 105 (H) 05/11/2019   ALT 16 05/11/2019   AST 19 05/11/2019   NA 139 11/29/2019   K 4.1 11/29/2019   CL 101 11/29/2019   CREATININE 1.14 (H) 11/29/2019   BUN 18 11/29/2019   CO2 30 11/29/2019   TSH 2.27 05/11/2019   INR 1.0 05/25/2007   HGBA1C 6.3 08/10/2019   MICROALBUR <0.7 05/11/2019    CT Head Wo Contrast  Result Date: 12/20/2017 CLINICAL DATA:  Hypertension, headache EXAM: CT HEAD WITHOUT CONTRAST TECHNIQUE: Contiguous axial images were obtained from the base of the skull through the vertex without intravenous contrast. COMPARISON:  05/25/2007 FINDINGS: Brain: No acute intracranial abnormality. Specifically, no hemorrhage, hydrocephalus, mass lesion, acute infarction, or significant intracranial injury. Vascular: No hyperdense vessel or unexpected calcification. Skull: No acute calvarial abnormality. Sinuses/Orbits: Visualized paranasal sinuses and mastoids clear. Orbital soft tissues unremarkable. Other: None IMPRESSION: Normal study. Electronically Signed   By: Rolm Baptise M.D.   On: 12/20/2017 14:00   DG Thoracic Spine W/Swimmers  Result Date: 11/29/2019 CLINICAL DATA:  Dorsalgia EXAM:  THORACIC SPINE - 3 VIEWS COMPARISON:  None. FINDINGS: Frontal, lateral, and swimmer's views were obtained. There is no fracture or spondylolisthesis. There is slight disc space narrowing at several levels. There are small anterior and right lateral osteophytes at several levels in the lower thoracic region. No erosive change or paraspinous lesion. Visualized lungs clear. IMPRESSION: Relatively mild osteoarthritic change at several levels. No fracture or spondylolisthesis. Electronically Signed   By: Lowella Grip III M.D.   On: 11/29/2019 09:02    Assessment & Plan:   Shadasia was seen today for hypertension and back pain.  Diagnoses and all orders for this visit:  Acute left-sided thoracic back pain- Based on her symptoms, exam, and plain films she  has had a flareup of DDD.  I recommended that she treat this with oxycodone as needed. -     DG Thoracic Spine W/Swimmers; Future -     oxyCODONE (OXY IR/ROXICODONE) 5 MG immediate release tablet; Take 1 tablet (5 mg total) by mouth every 4 (four) hours as needed for severe pain.  Essential hypertension, benign- Her blood pressure is well controlled.  Electrolytes and renal function are normal.  Will continue the combination of carvedilol and indapamide. -     carvedilol (COREG) 3.125 MG tablet; Take 1 tablet (3.125 mg total) by mouth 2 (two) times daily with a meal. -     indapamide (LOZOL) 1.25 MG tablet; Take 1 tablet (1.25 mg total) by mouth daily. -     BASIC METABOLIC PANEL WITH GFR; Future -     BASIC METABOLIC PANEL WITH GFR  S/P laparoscopic sleeve gastrectomy -     Cholecalciferol 1.25 MG (50000 UT) capsule; Take 1 capsule (50,000 Units total) by mouth once a week. -     VITAMIN D 25 Hydroxy (Vit-D Deficiency, Fractures); Future -     VITAMIN D 25 Hydroxy (Vit-D Deficiency, Fractures)  Vitamin D deficiency disease- Her vitamin D level remains low.  I recommended that she improve her compliance with vitamin D replacement therapy. -      Cholecalciferol 1.25 MG (50000 UT) capsule; Take 1 capsule (50,000 Units total) by mouth once a week. -     VITAMIN D 25 Hydroxy (Vit-D Deficiency, Fractures); Future -     VITAMIN D 25 Hydroxy (Vit-D Deficiency, Fractures)  Need for pneumococcal vaccination -     Pneumococcal polysaccharide vaccine 23-valent greater than or equal to 2yo subcutaneous/IM  Need for shingles vaccine -     Varicella-zoster vaccine IM (Shingrix)  DDD (degenerative disc disease), thoracic -     oxyCODONE (OXY IR/ROXICODONE) 5 MG immediate release tablet; Take 1 tablet (5 mg total) by mouth every 4 (four) hours as needed for severe pain.  Type 2 diabetes mellitus with hyperglycemia, without long-term current use of insulin ([place])   I have discontinued Mrs. Iceis D. Purdum's Relafen DS, Vitamin D (Ergocalciferol), and buPROPion. I am also having her start on oxyCODONE. Additionally, I am having her maintain her multivitamin, calcium carbonate, thiamine, carvedilol, Cholecalciferol, and indapamide.  Meds ordered this encounter  Medications  . carvedilol (COREG) 3.125 MG tablet    Sig: Take 1 tablet (3.125 mg total) by mouth 2 (two) times daily with a meal.    Dispense:  180 tablet    Refill:  1  . Cholecalciferol 1.25 MG (50000 UT) capsule    Sig: Take 1 capsule (50,000 Units total) by mouth once a week.    Dispense:  12 capsule    Refill:  0  . indapamide (LOZOL) 1.25 MG tablet    Sig: Take 1 tablet (1.25 mg total) by mouth daily.    Dispense:  90 tablet    Refill:  0  . oxyCODONE (OXY IR/ROXICODONE) 5 MG immediate release tablet    Sig: Take 1 tablet (5 mg total) by mouth every 4 (four) hours as needed for severe pain.    Dispense:  30 tablet    Refill:  0     Follow-up: Return in about 3 weeks (around 12/20/2019).  Scarlette Calico, MD

## 2019-11-30 NOTE — Telephone Encounter (Signed)
Spoke with patient and answered questions.

## 2019-12-04 ENCOUNTER — Encounter: Payer: Self-pay | Admitting: Internal Medicine

## 2019-12-12 ENCOUNTER — Encounter: Payer: Self-pay | Admitting: Internal Medicine

## 2019-12-13 ENCOUNTER — Other Ambulatory Visit: Payer: Self-pay | Admitting: Internal Medicine

## 2019-12-13 DIAGNOSIS — M5134 Other intervertebral disc degeneration, thoracic region: Secondary | ICD-10-CM

## 2019-12-13 DIAGNOSIS — M546 Pain in thoracic spine: Secondary | ICD-10-CM

## 2019-12-13 MED ORDER — TIZANIDINE HCL 2 MG PO TABS: 2.0000 mg | ORAL_TABLET | Freq: Three times a day (TID) | ORAL | 1 refills | Status: DC | PRN

## 2019-12-13 MED FILL — tiZANidine HCL 2 MG TABS: 2 | 30 days supply | Qty: 90 | Fill #0

## 2019-12-20 ENCOUNTER — Ambulatory Visit: Payer: 59 | Admitting: Cardiovascular Disease

## 2019-12-20 ENCOUNTER — Ambulatory Visit: Payer: 59 | Admitting: Internal Medicine

## 2019-12-22 MED FILL — INDAPAMIDE 1.25 MG TABS: 1.25 | 90 days supply | Qty: 90 | Fill #0

## 2019-12-22 MED FILL — CARVEDILOL 3.125 MG TABLET: 3.125 | 90 days supply | Qty: 180 | Fill #0

## 2020-01-11 ENCOUNTER — Ambulatory Visit: Payer: 59 | Admitting: Internal Medicine

## 2020-01-25 ENCOUNTER — Encounter: Payer: Self-pay | Admitting: Internal Medicine

## 2020-01-29 ENCOUNTER — Ambulatory Visit (INDEPENDENT_AMBULATORY_CARE_PROVIDER_SITE_OTHER): Payer: No Typology Code available for payment source | Admitting: *Deleted

## 2020-01-29 ENCOUNTER — Other Ambulatory Visit: Payer: Self-pay

## 2020-01-29 DIAGNOSIS — Z23 Encounter for immunization: Secondary | ICD-10-CM | POA: Diagnosis not present

## 2020-01-29 NOTE — Progress Notes (Signed)
Pls cosign for Shingrix inj../lmb  

## 2020-02-08 ENCOUNTER — Ambulatory Visit: Payer: No Typology Code available for payment source | Admitting: Internal Medicine

## 2020-02-10 ENCOUNTER — Ambulatory Visit: Payer: No Typology Code available for payment source | Attending: Internal Medicine

## 2020-02-10 DIAGNOSIS — Z23 Encounter for immunization: Secondary | ICD-10-CM

## 2020-02-10 NOTE — Progress Notes (Signed)
° °  Covid-19 Vaccination Clinic  Name:  SYLVER VANTASSELL    MRN: [id number] DOB: Sep 11, 1965  02/10/2020  Ms. Nathaneil Canary was observed post Covid-19 immunization for 15 minutes without incident. She was provided with Vaccine Information Sheet and instruction to access the V-Safe system.   Ms. Nathaneil Canary was instructed to call 911 with any severe reactions post vaccine:  Difficulty breathing   Swelling of face and throat   A fast heartbeat   A bad rash all over body   Dizziness and weakness

## 2020-03-01 ENCOUNTER — Encounter (HOSPITAL_COMMUNITY): Payer: Self-pay

## 2020-04-09 ENCOUNTER — Other Ambulatory Visit: Payer: Self-pay | Admitting: Internal Medicine

## 2020-04-09 DIAGNOSIS — I10 Essential (primary) hypertension: Secondary | ICD-10-CM

## 2020-04-09 MED FILL — INDAPAMIDE 1.25 MG TABS: 1.25 | 90 days supply | Qty: 90 | Fill #0

## 2020-04-09 MED FILL — CARVEDILOL 3.125 MG TABLET: 3.125 | 90 days supply | Qty: 180 | Fill #1

## 2020-05-09 ENCOUNTER — Encounter: Payer: Self-pay | Admitting: Internal Medicine

## 2020-06-12 ENCOUNTER — Encounter: Payer: Self-pay | Admitting: Internal Medicine

## 2020-06-19 ENCOUNTER — Encounter: Payer: Self-pay | Admitting: Internal Medicine

## 2020-07-15 ENCOUNTER — Telehealth: Payer: Self-pay

## 2020-07-15 NOTE — Telephone Encounter (Signed)
Okay to schedule NP appt with Dr. Martinique.

## 2020-07-15 NOTE — Telephone Encounter (Signed)
LMVM for the patient to contact the office to schedule a new patient appointment with Dr. Martinique

## 2020-07-17 ENCOUNTER — Other Ambulatory Visit: Payer: Self-pay | Admitting: Internal Medicine

## 2020-07-17 ENCOUNTER — Other Ambulatory Visit (HOSPITAL_COMMUNITY): Payer: Self-pay

## 2020-07-17 DIAGNOSIS — I10 Essential (primary) hypertension: Secondary | ICD-10-CM

## 2020-07-18 ENCOUNTER — Other Ambulatory Visit (HOSPITAL_COMMUNITY): Payer: Self-pay

## 2020-07-22 ENCOUNTER — Other Ambulatory Visit (HOSPITAL_COMMUNITY): Payer: Self-pay

## 2020-08-01 ENCOUNTER — Other Ambulatory Visit: Payer: Self-pay

## 2020-08-02 ENCOUNTER — Other Ambulatory Visit (HOSPITAL_COMMUNITY): Payer: Self-pay

## 2020-08-02 ENCOUNTER — Ambulatory Visit: Payer: No Typology Code available for payment source | Admitting: Family Medicine

## 2020-08-02 ENCOUNTER — Encounter: Payer: Self-pay | Admitting: Family Medicine

## 2020-08-02 VITALS — BP 124/86 | HR 79 | Temp 98.9°F | Resp 16 | Ht 62.0 in | Wt 262.6 lb

## 2020-08-02 DIAGNOSIS — N951 Menopausal and female climacteric states: Secondary | ICD-10-CM

## 2020-08-02 DIAGNOSIS — E785 Hyperlipidemia, unspecified: Secondary | ICD-10-CM

## 2020-08-02 DIAGNOSIS — E559 Vitamin D deficiency, unspecified: Secondary | ICD-10-CM

## 2020-08-02 DIAGNOSIS — E1165 Type 2 diabetes mellitus with hyperglycemia: Secondary | ICD-10-CM

## 2020-08-02 DIAGNOSIS — M722 Plantar fascial fibromatosis: Secondary | ICD-10-CM

## 2020-08-02 DIAGNOSIS — E1169 Type 2 diabetes mellitus with other specified complication: Secondary | ICD-10-CM

## 2020-08-02 DIAGNOSIS — Z1159 Encounter for screening for other viral diseases: Secondary | ICD-10-CM

## 2020-08-02 DIAGNOSIS — Z9884 Bariatric surgery status: Secondary | ICD-10-CM

## 2020-08-02 DIAGNOSIS — I10 Essential (primary) hypertension: Secondary | ICD-10-CM | POA: Diagnosis not present

## 2020-08-02 DIAGNOSIS — R748 Abnormal levels of other serum enzymes: Secondary | ICD-10-CM

## 2020-08-02 LAB — POCT GLYCOSYLATED HEMOGLOBIN (HGB A1C): HbA1c, POC (prediabetic range): 5.8 % (ref 5.7–6.4)

## 2020-08-02 MED ORDER — CARVEDILOL 3.125 MG PO TABS
3.1250 mg | ORAL_TABLET | Freq: Two times a day (BID) | ORAL | 2 refills | Status: DC
Start: 2020-08-02 — End: 2021-02-01
  Filled 2020-08-02: qty 180, 90d supply, fill #0
  Filled 2020-11-26: qty 180, 90d supply, fill #1

## 2020-08-02 MED ORDER — INDAPAMIDE 1.25 MG PO TABS
1.2500 mg | ORAL_TABLET | Freq: Every day | ORAL | 2 refills | Status: DC
  Filled 2020-08-02: qty 90, 90d supply, fill #0
  Filled 2020-11-26: qty 90, 90d supply, fill #1

## 2020-08-02 NOTE — Assessment & Plan Note (Signed)
We discussed possible etiologies. RUQ [place] will be arranged. Wt loss and low fat diet recommended for now. Further recommendations according to lab result.

## 2020-08-02 NOTE — Progress Notes (Signed)
HPI: [name] is a 55 y.o. female, who is here today to establish care.  Former PCP: Dr Ronnald Ramp. Last preventive routine visit: 05/11/19.  Chronic medical problems: HTN,vein insufficiency,vit D deficiency, DM II,HLD, CKD III,and OA among some.  DM II Dx'ed 09/2014. She is on non pharmacologic treatment. She is not checking BS's. Used to follow with endocrinologist, Dr Cruzita Lederer. Negative for polydipsia,polyuria, or polyphagia.  Last HgA1C 6.3 in 07/2019. She sees Dr Katy Fitch for her eye exam, next appt 08/30/20.  HTN:Dx'ed in 02/2017. She is on Carvedilol 3.125 mg bid and Indapamide 1.25 mg daily. Negative for severe/frequent headache, visual changes, chest pain, dyspnea, palpitation,focal weakness, or worsening edema.  Reporting hx of TIA in 2007. She is not on statin or antiplatelet therapy.  Lab Results  Component Value Date   CREATININE 1.14 (H) 11/29/2019   BUN 18 11/29/2019   NA 139 11/29/2019   K 4.1 11/29/2019   CL 101 11/29/2019   CO2 30 11/29/2019   Elevated alk phosphatase for many years. Negative for high alcohol intake.  Lab Results  Component Value Date   ALT 16 05/11/2019   AST 19 05/11/2019   ALKPHOS 255 (H) 05/11/2019   BILITOT 0.3 05/11/2019   04/2013 RUQ US showed hepatic steatosis. HLD: She is on non pharmacologic treatment.  Lab Results  Component Value Date   CHOL 179 05/11/2019   HDL 56.80 05/11/2019   LDLCALC 105 (H) 05/11/2019   TRIG 84.0 05/11/2019   CHOLHDL 3 05/11/2019    Back pain:She takes Zanaflex. Problem has been stable. OA IP hands and knees.  Obesity: S/P bariatric surgery in 2018, sleeve gastrectomy. Straggling to loose wt. Started intermittent fasting 2 days ago. She walks daily 2 miles for the past 2 months. She was following with Wt loss clinic but could not keep appts because work schedule. She is baking more, increased fruit intake,and eating fish. She is not taking her multivitamins consistently.  Hot  flashes, from neck up to head.  Associated sweating. Problem has been going on for 2 years. She has not identified exacerbating or alleviating factors. Feels like crying sometime. Denies hx of depression.  Lab Results  Component Value Date   TSH 2.27 05/11/2019   She has not tried OTC medications. S/p hysterectomy in 05/2003.  Regular exercise limited by left heel pain. Pain is "so bad." Worse when she first gets up in the morning and walks. No hx of trauma. She has not noted local edema or erythema.  Review of Systems  Constitutional: Negative for activity change, appetite change and fever.  HENT: Negative for mouth sores, nosebleeds, sore throat and trouble swallowing.   Respiratory: Negative for cough and wheezing.   Gastrointestinal: Negative for abdominal pain, nausea and vomiting.       Negative for changes in bowel habits.  Endocrine: Negative for cold intolerance and heat intolerance.  Genitourinary: Negative for decreased urine volume, dysuria and hematuria.  Musculoskeletal: Positive for arthralgias and back pain. Negative for gait problem.  Neurological: Negative for syncope, facial asymmetry and weakness.  Psychiatric/Behavioral: Negative for confusion.  Rest see pertinent positives and negatives per HPI.  Current Outpatient Medications on File Prior to Visit  Medication Sig Dispense Refill  . calcium carbonate (TUMS EX) 750 MG chewable tablet Chew 1,500 mg by mouth daily.    . Cholecalciferol 1.25 MG (50000 UT) capsule Take 1 capsule (50,000 Units total) by mouth once a week. 12 capsule 0  . Multiple Vitamin (MULTIVITAMIN)  tablet Take 1 tablet by mouth daily.    Marland Kitchen thiamine 100 MG tablet Take 1 tablet (100 mg total) by mouth every other day. 45 tablet 1  . tiZANidine (ZANAFLEX) 2 MG tablet Take 1 tablet (2 mg total) by mouth every 8 (eight) hours as needed for muscle spasms. 90 tablet 1   No current facility-administered medications on file prior to visit.   Past  Medical History:  Diagnosis Date  . Anemia   . Back pain   . Clotting disorder ([place])   . Diabetes mellitus without complication ([place])   . Edema 04/25/2015  . Headache(784.0)   . Herniated disc   . History of colonic polyps   . Hypertension   . Joint pain   . Obesity   . Osteoarthritis   . Shortness of breath 04/25/2015  . TIA (transient ischemic attack) 2007   hx of  . Vasculitis (HCC)    L leg - Polyarthritis  . Venous insufficiency   . Vitamin D deficiency    No Known Allergies  Family History  Problem Relation Age of Onset  . Stroke Mother   . Hypertension Mother   . AAA (abdominal aortic aneurysm) Mother   . Hypertension Father   . Cancer Father        Prostate  . Arthritis Other   . Hypertension Other   . Stroke Other   . Colon cancer Neg Hx   . Esophageal cancer Neg Hx   . Rectal cancer Neg Hx   . Stomach cancer Neg Hx     Social History   Socioeconomic History  . Marital status: Married    Spouse name: Elberta Fortis  . Number of children: Not on file  . Years of education: Not on file  . Highest education level: Not on file  Occupational History  . 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 Smoking status: Never Smoker  . Smokeless tobacco: Never Used  Vaping Use  . Vaping Use: Never used  Substance and Sexual Activity  . Alcohol use: Yes    Alcohol/week: 0.0 standard drinks    Comment: rarely  . Drug use: No  . Sexual activity: Yes    Birth control/protection: Surgical  Other Topics Concern  . Not on file  Social History Narrative  . Not on file   Social Determinants of Health   Financial Resource Strain: Not on file  Food Insecurity: Not on file  Transportation Needs: Not on file  Physical Activity: Not on file  Stress: Not on file  Social Connections: Not on file   Vitals:   08/02/20 1506  BP: 124/86  Pulse: 79  Resp: 16  Temp: 98.9 F (37.2 C)  SpO2: 97%   Body mass index is 48.03 kg/m.  Physical Exam Vitals and  nursing note reviewed.  Constitutional:      General: She is not in acute distress.    Appearance: She is well-developed.  HENT:     Head: Normocephalic and atraumatic.     Mouth/Throat:     Mouth: Mucous membranes are moist.     Pharynx: Oropharynx is clear.  Eyes:     Conjunctiva/sclera: Conjunctivae normal.  Cardiovascular:     Rate and Rhythm: Normal rate and regular rhythm.     Pulses:          Dorsalis pedis pulses are 2+ on the right side and 2+ on the left side.     Heart sounds: No murmur heard.  Pulmonary:     Effort: Pulmonary effort is normal. No respiratory distress.     Breath sounds: Normal breath sounds.  Abdominal:     Palpations: Abdomen is soft. There is no hepatomegaly or mass.     Tenderness: There is no abdominal tenderness.  Musculoskeletal:       Feet:     Comments: Left foot:Tenderness upon palpation of heel mainly at the medial insertion of plantar fascia into calcaneous. Also mild discomfort with palpation along planta fascia towards forefoot.   Dorsal flexion of first MTP elicits mild pain.  No edema or erythema appreciated on area.   Lymphadenopathy:     Cervical: No cervical adenopathy.  Skin:    General: Skin is warm.     Findings: No erythema or rash.  Neurological:     General: No focal deficit present.     Mental Status: She is alert and oriented to person, place, and time.     Cranial Nerves: No cranial nerve deficit.     Gait: Gait normal.  Psychiatric:     Comments: Well groomed, good eye contact.   ASSESSMENT AND PLAN:  [name] was seen today for establish care.  Diagnoses and all orders for this visit: Orders Placed This Encounter  Procedures  . US Abdomen Limited RUQ (LIVER/GB)  . Microalbumin / creatinine urine ratio  . Hepatitis C antibody  . Gamma GT  . Hepatic function panel  . Vitamin B12  . VITAMIN D 25 Hydroxy (Vit-D Deficiency, Fractures)  . Vitamin B1, whole blood  . Basic metabolic panel  . POC HgB A1c    Lab Results  Component Value Date   HGBA1C 5.8 08/02/2020   Lab Results  Component Value Date   LABMICR CANCELED 07/12/2019   MICROALBUR <0.2 08/02/2020   MICROALBUR <0.7 05/11/2019   Lab Results  Component Value Date   CREATININE 1.10 (H) 08/02/2020   BUN 17 08/02/2020   NA 140 08/02/2020   K 4.1 08/02/2020   CL 106 08/02/2020   CO2 25 08/02/2020   Lab Results  Component Value Date   ALT 17 08/02/2020   AST 17 08/02/2020   ALKPHOS 255 (H) 05/11/2019   BILITOT 0.3 08/02/2020   Lab Results  Component Value Date   VITAMINB12 536 08/02/2020    Vitamin D deficiency disease Further recommendations according to 25 OH vit D result.  Type 2 diabetes mellitus with hyperglycemia, without long-term current use of insulin (HCC) HgA1C at goal. Continue non pharmacologic treatment. Annual eye exam, periodic dental and foot care recommended. F/U in 5-6 months   Severe obesity (BMI >= 40) (HCC) She understands benefits of wt loss as well as adverse effects of obesity. Consistency with healthy diet and physical activity recommended. She would like trying intermittent fasting.   Hot flash, menopausal Non hormonal treatment options discussed: Effexor and Paroxetine. She prefers to hold on treatment. Try to identified trigger factors.  Essential hypertension, benign BP adequately controlled. Continue Carvedilol 3.125 mg bid and Indapamine 1.25 mg daily. Low salt diet to continue. Eye exam is current.  Hyperlipidemia associated with type 2 diabetes mellitus ([place]) We discussed benefits of statins. Reporting hx of TIA, so LDL goal at least < 100. She is not interested in pharmacologic treatment. Continue low fat diet. We will plan on checking FLP next visit.  Alkaline phosphatase elevation We discussed possible etiologies. RUQ [place] will be arranged. Wt loss and low fat diet recommended for now. Further recommendations according to lab result.  Encounter for HCV  screening test for low risk patient -     Hepatitis C antibody  S/P bariatric surgery -     Vitamin B12 -     Vitamin B1, whole blood  Plantar fasciitis We discussed Dx, prognosis,and treatment options. For now recommend stretching exercises,night splint,and comfortable shoe wear. Wt loss definitively will help. If not better in a few weeks, recommend appt with podiatrist.  Spent 58 minutes.  During this time history was obtained and documented, examination was performed, prior labs/imaging reviewed, and assessment/plan discussed.  Return in about 6 months (around 02/01/2021) for CPE.   Etana Beets G. Martinique, MD  [facility]. [place] office.   A few things to remember from today's visit:   Type 2 diabetes mellitus with hyperglycemia, without long-term current use of insulin ([place]) - Plan: POC HgB A1c, Microalbumin / creatinine urine ratio  Vitamin D deficiency disease - Plan: VITAMIN D 25 Hydroxy (Vit-D Deficiency, Fractures)  Encounter for HCV screening test for low risk patient - Plan: Hepatitis C antibody  Essential hypertension, benign - Plan: Basic metabolic panel, indapamide (LOZOL) 1.25 MG tablet, carvedilol (COREG) 3.125 MG tablet  S/P bariatric surgery - Plan: Vitamin B12, Vitamin B1, whole blood  Elevated alkaline phosphatase level - Plan: Hepatic function panel, Gamma GT  If you need refills please call your pharmacy. Do not use My Chart to request refills or for acute issues that need immediate attention.   No changes today.  Please be sure medication list is accurate. If a new problem present, please set up appointment sooner than planned today.

## 2020-08-02 NOTE — Assessment & Plan Note (Addendum)
We discussed benefits of statins. Reporting hx of TIA, so LDL goal at least < 100. She is not interested in pharmacologic treatment. Continue low fat diet. We will plan on checking FLP next visit.

## 2020-08-02 NOTE — Assessment & Plan Note (Signed)
Further recommendations according to 25 OH vit D result. 

## 2020-08-02 NOTE — Assessment & Plan Note (Signed)
HgA1C at goal. Continue non pharmacologic treatment. Annual eye exam, periodic dental and foot care recommended. F/U in 5-6 months. 

## 2020-08-02 NOTE — Assessment & Plan Note (Signed)
She understands benefits of wt loss as well as adverse effects of obesity. Consistency with healthy diet and physical activity recommended. She would like trying intermittent fasting.

## 2020-08-02 NOTE — Patient Instructions (Addendum)
A few things to remember from today's visit:   Type 2 diabetes mellitus with hyperglycemia, without long-term current use of insulin ([place]) - Plan: POC HgB A1c, Microalbumin / creatinine urine ratio  Vitamin D deficiency disease - Plan: VITAMIN D 25 Hydroxy (Vit-D Deficiency, Fractures)  Encounter for HCV screening test for low risk patient - Plan: Hepatitis C antibody  Essential hypertension, benign - Plan: Basic metabolic panel, indapamide (LOZOL) 1.25 MG tablet, carvedilol (COREG) 3.125 MG tablet  S/P bariatric surgery - Plan: Vitamin B12, Vitamin B1, whole blood  Elevated alkaline phosphatase level - Plan: Hepatic function panel, Gamma GT  If you need refills please call your pharmacy. Do not use My Chart to request refills or for acute issues that need immediate attention.   No changes today. Continue working on dietary changes. Please be sure medication list is accurate. If a new problem present, please set up appointment sooner than planned today.

## 2020-08-02 NOTE — Assessment & Plan Note (Signed)
Non hormonal treatment options discussed: Effexor and Paroxetine. She prefers to hold on treatment. Try to identified trigger factors.

## 2020-08-02 NOTE — Assessment & Plan Note (Signed)
BP adequately controlled. Continue Carvedilol 3.125 mg bid and Indapamine 1.25 mg daily. Low salt diet to continue. Eye exam is current.

## 2020-08-07 ENCOUNTER — Encounter: Payer: Self-pay | Admitting: Family Medicine

## 2020-08-09 LAB — HEPATITIS C ANTIBODY
Hepatitis C Ab: NONREACTIVE
SIGNAL TO CUT-OFF: 0.01 (ref <1.00)

## 2020-08-09 LAB — VITAMIN B12: Vitamin B-12: 536 pg/mL (ref 200–1100)

## 2020-08-09 LAB — GAMMA GT: GGT: 295 U/L — ABNORMAL HIGH (ref 3–70)

## 2020-08-09 LAB — BASIC METABOLIC PANEL
BUN/Creatinine Ratio: 15 (calc) (ref 6–22)
BUN: 17 mg/dL (ref 7–25)
CO2: 25 mmol/L (ref 20–32)
Calcium: 9.2 mg/dL (ref 8.6–10.4)
Chloride: 106 mmol/L (ref 98–110)
Creat: 1.1 mg/dL — ABNORMAL HIGH (ref 0.50–1.05)
Glucose, Bld: 76 mg/dL (ref 65–99)
Potassium: 4.1 mmol/L (ref 3.5–5.3)
Sodium: 140 mmol/L (ref 135–146)

## 2020-08-09 LAB — VITAMIN D 25 HYDROXY (VIT D DEFICIENCY, FRACTURES): Vit D, 25-Hydroxy: 24 ng/mL — ABNORMAL LOW (ref 30–100)

## 2020-08-09 LAB — HEPATIC FUNCTION PANEL
AG Ratio: 1.3 (calc) (ref 1.0–2.5)
ALT: 17 U/L (ref 6–29)
AST: 17 U/L (ref 10–35)
Albumin: 4 g/dL (ref 3.6–5.1)
Alkaline phosphatase (APISO): 305 U/L — ABNORMAL HIGH (ref 37–153)
Bilirubin, Direct: 0.1 mg/dL (ref 0.0–0.2)
Globulin: 3 g/dL (calc) (ref 1.9–3.7)
Indirect Bilirubin: 0.2 mg/dL (calc) (ref 0.2–1.2)
Total Bilirubin: 0.3 mg/dL (ref 0.2–1.2)
Total Protein: 7 g/dL (ref 6.1–8.1)

## 2020-08-09 LAB — MICROALBUMIN / CREATININE URINE RATIO
Creatinine, Urine: 154 mg/dL (ref 20–275)
Microalb, Ur: <0.2 mg/dL

## 2020-08-09 LAB — VITAMIN B1, WHOLE BLOOD: Vitamin B1 (Thiamine), Blood: 81 nmol/L (ref 78–185)

## 2020-08-19 ENCOUNTER — Ambulatory Visit
Admission: RE | Admit: 2020-08-19 | Discharge: 2020-08-19 | Disposition: A | Discharge status: Home / Self Care | Payer: No Typology Code available for payment source | Source: Ambulatory Visit | Attending: Family Medicine | Admitting: Family Medicine

## 2020-08-19 DIAGNOSIS — R748 Abnormal levels of other serum enzymes: Secondary | ICD-10-CM

## 2020-08-21 ENCOUNTER — Encounter: Payer: Self-pay | Admitting: Family Medicine

## 2020-08-22 NOTE — Telephone Encounter (Signed)
Patient called wanting her ultrasound results

## 2020-08-26 ENCOUNTER — Other Ambulatory Visit: Payer: Self-pay

## 2020-08-26 DIAGNOSIS — K8020 Calculus of gallbladder without cholecystitis without obstruction: Secondary | ICD-10-CM

## 2020-09-02 ENCOUNTER — Encounter: Payer: Self-pay | Admitting: Family Medicine

## 2020-09-03 ENCOUNTER — Encounter: Payer: Self-pay | Admitting: Family Medicine

## 2020-09-03 ENCOUNTER — Other Ambulatory Visit: Payer: Self-pay

## 2020-09-03 ENCOUNTER — Ambulatory Visit: Payer: No Typology Code available for payment source | Admitting: Family Medicine

## 2020-09-03 ENCOUNTER — Other Ambulatory Visit (HOSPITAL_COMMUNITY): Payer: Self-pay

## 2020-09-03 VITALS — BP 124/70 | HR 78 | Resp 16 | Ht 62.0 in | Wt 258.2 lb

## 2020-09-03 DIAGNOSIS — E1169 Type 2 diabetes mellitus with other specified complication: Secondary | ICD-10-CM

## 2020-09-03 DIAGNOSIS — H8112 Benign paroxysmal vertigo, left ear: Secondary | ICD-10-CM | POA: Diagnosis not present

## 2020-09-03 DIAGNOSIS — E785 Hyperlipidemia, unspecified: Secondary | ICD-10-CM

## 2020-09-03 MED ORDER — PRAVASTATIN SODIUM 10 MG PO TABS
10.0000 mg | ORAL_TABLET | Freq: Every day | ORAL | 3 refills | Status: DC
  Filled 2020-09-03 – 2020-09-24 (×2): qty 90, 90d supply, fill #0

## 2020-09-03 MED ORDER — MECLIZINE HCL 25 MG PO TABS
25.0000 mg | ORAL_TABLET | Freq: Three times a day (TID) | ORAL | 0 refills | Status: DC | PRN
Start: 2020-09-03 — End: 2021-01-28
  Filled 2020-09-03 – 2020-09-24 (×2): qty 30, 10d supply, fill #0

## 2020-09-03 NOTE — Patient Instructions (Addendum)
A few things to remember from today's visit:  Benign paroxysmal positional vertigo of left ear - Plan: meclizine (ANTIVERT) 25 MG tablet  Hyperlipidemia associated with type 2 diabetes mellitus ([place]) - Plan: pravastatin (PRAVACHOL) 10 MG tablet  Wt Readings from Last 3 Encounters:  09/03/20 258 lb 4 oz (117.1 kg)  08/02/20 262 lb 9.6 oz (119.1 kg)  11/29/19 257 lb 2 oz (116.6 kg)   You lost some wt since your last visit. Continue a healthful diet and try to engage in regular physical activity.  If you need refills please call your pharmacy. Do not use My Chart to request refills or for acute issues that need immediate attention.    Dizziness is a perception of movement, it is sometimes difficult to describe and can be  caused by different problems, most benign but others can be life threaten.  Vertigo is the most common cause of dizziness, usually related with inner ear and can be associated with nausea, vomiting, and unbalance sensation. It can be complicated by falls due to lose of balance; so fall precautions are very important.  Most of the time dizziness is benign, usually intermittent, last a few seconds at the time and aggravated by certain positions. It usually resolves in a few weeks without residual effect but it could be recurrent.  Sometimes blood work is ordered to evaluate for other possible causes.  Dizziness can also be caused by certain medications, dehydration, migraines, and strokes.  Medication prescribed for vertigo, Meclizine, causes drowsiness/sleepiness, so frequently I recommended taking it at bedtime. I also recommend what we called vestibular exercise, sometimes can be done at home (Modified Semont maneuvers) other times I refer patients to vestibular rehabilitation.  Seek immediate medical attention if: New severe headache, dobble vision, fever (100 F or more), associated numbness/tingling, focal weakness, persistent vomiting, not able to walk, or sudden  worsening symptoms.  Please be sure medication list is accurate. If a new problem present, please set up appointment sooner than planned today.

## 2020-09-03 NOTE — Progress Notes (Signed)
Chief Complaint  Patient presents with  . Follow-up  . Dizziness   HPI: [name] is a 55 y.o. female with hx of DM II,CKD III,HTN, and OA here today with above complaint.  Problem started 2 days ago, noted when [name] woke up, "bad" dizziness, "everything spinning around." Exacerbated by movement and alleviated by being still.  Episodes last 2-5 minutes. Negative for changes in hearing,earache, or tinnitus. Negative for associated fever,chills,changes in appetite,visual changes, CP,palpitations,dyspnea,diaphoresis,N/V, or focal neurologic deficit.  Occasionally occipital headache and neck pain but not associated with dizziness.  No recent URI or travel. [name] has not tried OTC treatments. [name] has had similar problem years ago, 2013-2014.  HLD: [name] is not on pharmacologic treatment. DM II. Hx of TIA Lab Results  Component Value Date   CHOL 179 05/11/2019   HDL 56.80 05/11/2019   LDLCALC 105 (H) 05/11/2019   TRIG 84.0 05/11/2019   CHOLHDL 3 05/11/2019   [name] started [employer] recently and has already noted wt loss. [name] does not exercise regularly.  Review of Systems  Constitutional: Negative for activity change and fatigue.  HENT: Negative for ear discharge, mouth sores, nosebleeds and sore throat.   Respiratory: Negative for cough and wheezing.   Gastrointestinal: Negative for abdominal pain.       Negative for changes in bowel habits.  Genitourinary: Negative for decreased urine volume and hematuria.  Musculoskeletal: Negative for gait problem and myalgias.  Allergic/Immunologic: Negative for environmental allergies.  Neurological: Negative for syncope and facial asymmetry.  Hematological: Negative for adenopathy. Does not bruise/bleed easily.  Rest see pertinent positives and negatives per HPI.  Current Outpatient Medications on File Prior to Visit  Medication Sig Dispense Refill  . calcium carbonate (TUMS EX) 750 MG chewable tablet Chew 1,500 mg by mouth daily.     . carvedilol (COREG) 3.125 MG tablet Take 1 tablet (3.125 mg total) by mouth 2 (two) times daily with a meal. 180 tablet 2  . Cholecalciferol 1.25 MG (50000 UT) capsule Take 1 capsule (50,000 Units total) by mouth once a week. 12 capsule 0  . indapamide (LOZOL) 1.25 MG tablet Take 1 tablet (1.25 mg total) by mouth daily. 90 tablet 2  . Multiple Vitamin (MULTIVITAMIN) tablet Take 1 tablet by mouth daily.    Marland Kitchen thiamine 100 MG tablet Take 1 tablet (100 mg total) by mouth every other day. 45 tablet 1  . tiZANidine (ZANAFLEX) 2 MG tablet Take 1 tablet (2 mg total) by mouth every 8 (eight) hours as needed for muscle spasms. 90 tablet 1   No current facility-administered medications on file prior to visit.   Past Medical History:  Diagnosis Date  . Anemia   . Back pain   . Clotting disorder ([place])   . Diabetes mellitus without complication ([place])   . Edema 04/25/2015  . Headache(784.0)   . Herniated disc   . History of colonic polyps   . Hypertension   . Joint pain   . Obesity   . Osteoarthritis   . Shortness of breath 04/25/2015  . TIA (transient ischemic attack) 2007   hx of  . Vasculitis (HCC)    L leg - Polyarthritis  . Venous insufficiency   . Vitamin D deficiency    No Known Allergies  Social History   Socioeconomic History  . Marital status: Married    Spouse name: Elberta Fortis  . Number of children: Not on file  . Years of education: Not on file  . Highest education  level: Not on file  Occupational History  . 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 Smoking status: Never Smoker  . Smokeless tobacco: Never Used  Vaping Use  . Vaping Use: Never used  Substance and Sexual Activity  . Alcohol use: Yes    Alcohol/week: 0.0 standard drinks    Comment: rarely  . Drug use: No  . Sexual activity: Yes    Birth control/protection: Surgical  Other Topics Concern  . Not on file  Social History Narrative  . Not on file   Social Determinants of Health   Financial  Resource Strain: Not on file  Food Insecurity: Not on file  Transportation Needs: Not on file  Physical Activity: Not on file  Stress: Not on file  Social Connections: Not on file   Vitals:   09/03/20 1512  BP: 124/70  Pulse: 78  Resp: 16  SpO2: 98%   Body mass index is 47.23 kg/m.  Physical Exam Vitals and nursing note reviewed.  Constitutional:      General: [name] is not in acute distress.    Appearance: [name] is well-developed. [name] is not ill-appearing.  HENT:     Head: Normocephalic and atraumatic.     Right Ear: Tympanic membrane, ear canal and external ear normal.     Left Ear: Tympanic membrane, ear canal and external ear normal.     Ears:     Comments: Apley maneuver and Dix-Hallpike maneuver positive,right. + Associated nystagmus.     Mouth/Throat:     Mouth: Mucous membranes are moist.     Pharynx: Oropharynx is clear.  Eyes:     Conjunctiva/sclera: Conjunctivae normal.     Pupils: Pupils are equal, round, and reactive to light.  Neck:     Vascular: No carotid bruit.  Cardiovascular:     Rate and Rhythm: Normal rate and regular rhythm.     Heart sounds: No murmur heard.   Pulmonary:     Effort: Pulmonary effort is normal. No respiratory distress.     Breath sounds: Normal breath sounds.  Abdominal:     Palpations: Abdomen is soft. There is no hepatomegaly or mass.     Tenderness: There is no abdominal tenderness.  Musculoskeletal:        General: No tenderness.  Lymphadenopathy:     Cervical: No cervical adenopathy.  Skin:    General: Skin is warm.     Findings: No erythema or rash.  Neurological:     General: No focal deficit present.     Mental Status: [name] is alert and oriented to person, place, and time.     Cranial Nerves: No cranial nerve deficit.     Motor: No pronator drift.     Gait: Gait normal.     Deep Tendon Reflexes:     Reflex Scores:      Patellar reflexes are 2+ on the right side and 2+ on the left side.    Comments: Pronator  drift negative.  Psychiatric:        Speech: Speech normal.     Comments: Well groomed, good eye contact.   ASSESSMENT AND PLAN:  [name] was seen today for follow-up and dizziness.  Diagnoses and all orders for this visit:  Benign paroxysmal positional vertigo of left ear We discussed other possible etiologies of dizziness, Hx and examination today suggest benign vertigo. I do not think further work-up is necessary at this time but needs to be consider if worsening or persient symptoms for  more that 2 weeks; in this case ENT will be considered. Explained that problem can be recurrent. Fall prevention. Vestibular exercises recommended, handout with Semont maneuvers given. Meclizine 25 mg tid prn, some side effects discussed. Instructed about warning signs. F/U as needed.  -     meclizine (ANTIVERT) 25 MG tablet; Take 1 tablet (25 mg total) by mouth 3 (three) times daily as needed for dizziness.  Hyperlipidemia associated with type 2 diabetes mellitus ([place]) We discussed CV benefits of statins. [name] agrees with starting Pravastatin 10 mg daily. Continue low fat diet.  -     pravastatin (PRAVACHOL) 10 MG tablet; Take 1 tablet (10 mg total) by mouth daily.  Severe obesity (BMI >= 40) (HCC) Lost about 4 Lb since her last visit. [name] understands the benefits of wt loss as well as adverse effects of obesity. Encouraged consistency with healthy diet and physical activity.  Return if symptoms worsen or fail to improve, for Keep next visit.Marland Kitchen   Wilton Thrall G. [place], MD  [facility] [place]. [place] office.   A few things to remember from today's visit:  Benign paroxysmal positional vertigo of left ear - Plan: meclizine (ANTIVERT) 25 MG tablet  Hyperlipidemia associated with type 2 diabetes mellitus ([place]) - Plan: pravastatin (PRAVACHOL) 10 MG tablet  Wt Readings from Last 3 Encounters:  09/03/20 258 lb 4 oz (117.1 kg)  08/02/20 262 lb 9.6 oz (119.1 kg)  11/29/19 257 lb 2 oz  (116.6 kg)   You lost some wt since your last visit. Continue a healthful diet and try to engage in regular physical activity.  If you need refills please call your pharmacy. Do not use My Chart to request refills or for acute issues that need immediate attention.    Dizziness is a perception of movement, it is sometimes difficult to describe and can be  caused by different problems, most benign but others can be life threaten.  Vertigo is the most common cause of dizziness, usually related with inner ear and can be associated with nausea, vomiting, and unbalance sensation. It can be complicated by falls due to lose of balance; so fall precautions are very important.  Most of the time dizziness is benign, usually intermittent, last a few seconds at the time and aggravated by certain positions. It usually resolves in a few weeks without residual effect but it could be recurrent.  Sometimes blood work is ordered to evaluate for other possible causes.  Dizziness can also be caused by certain medications, dehydration, migraines, and strokes.  Medication prescribed for vertigo, Meclizine, causes drowsiness/sleepiness, so frequently I recommended taking it at bedtime. I also recommend what we called vestibular exercise, sometimes can be done at home (Modified Semont maneuvers) other times I refer patients to vestibular rehabilitation.  Seek immediate medical attention if: New severe headache, dobble vision, fever (100 F or more), associated numbness/tingling, focal weakness, persistent vomiting, not able to walk, or sudden worsening symptoms.  Please be sure medication list is accurate. If a new problem present, please set up appointment sooner than planned today.

## 2020-09-09 ENCOUNTER — Encounter: Payer: Self-pay | Admitting: Family Medicine

## 2020-09-11 ENCOUNTER — Other Ambulatory Visit (HOSPITAL_COMMUNITY): Payer: Self-pay

## 2020-09-16 ENCOUNTER — Ambulatory Visit (INDEPENDENT_AMBULATORY_CARE_PROVIDER_SITE_OTHER): Payer: No Typology Code available for payment source

## 2020-09-16 ENCOUNTER — Encounter: Payer: Self-pay | Admitting: Podiatry

## 2020-09-16 ENCOUNTER — Other Ambulatory Visit: Payer: Self-pay

## 2020-09-16 ENCOUNTER — Ambulatory Visit (INDEPENDENT_AMBULATORY_CARE_PROVIDER_SITE_OTHER): Payer: No Typology Code available for payment source | Admitting: Podiatry

## 2020-09-16 DIAGNOSIS — M21612 Bunion of left foot: Secondary | ICD-10-CM

## 2020-09-16 DIAGNOSIS — M21611 Bunion of right foot: Secondary | ICD-10-CM

## 2020-09-16 DIAGNOSIS — M722 Plantar fascial fibromatosis: Secondary | ICD-10-CM

## 2020-09-16 DIAGNOSIS — M2012 Hallux valgus (acquired), left foot: Secondary | ICD-10-CM | POA: Diagnosis not present

## 2020-09-16 DIAGNOSIS — M2011 Hallux valgus (acquired), right foot: Secondary | ICD-10-CM

## 2020-09-16 NOTE — Patient Instructions (Signed)

## 2020-09-19 NOTE — Progress Notes (Signed)
  Subjective:  Patient ID: [name], female    DOB: [birth year],  MRN: [id number]  Chief Complaint  Patient presents with   Plantar Fasciitis     (xray)(NP)painful left foot-heel    55 y.o. female presents with the above complaint. History confirmed with patient.  This is been going on for about a month.  It hurts on the whole heel.  Worse in the morning.  Going to [place] then Wants to build to walk by then.  She is also interested in having her bunions corrected.  Objective:  Physical Exam: warm, good capillary refill, no trophic changes or ulcerative lesions, normal DP and PT pulses, and normal sensory exam.  Bilaterally she has hallux valgus with bunion formation with good range of motion.  No pain in the mid range of motion.  She has sharp pain on palpation of the plantar left heel at the insertion of the plantar fascia  Radiographs: X-ray of the left foot: no fracture, dislocation, swelling or degenerative changes noted, no plantar calcaneal spur, she has moderate hallux valgus deformity Assessment:   1. Plantar fasciitis of left foot   2. Hallux valgus with bunions, right   3. Hallux valgus with bunions, left      Plan:  Patient was evaluated and treated and all questions answered.  Discussed the etiology and treatment options for plantar fasciitis including stretching, formal physical therapy, supportive shoegears such as a running shoe or sneaker, pre fabricated orthoses, injection therapy, and oral medications. We also discussed the role of surgical treatment of this for patients who do not improve after exhausting non-surgical treatment options.   -XR reviewed with patient -Educated patient on stretching and icing of the affected limb -Plantar fascial brace dispensed -Injection delivered to the plantar fascia of the left foot. -She has previously had nausea and vomiting from Mobic so she will take Aleve or Motrin over-the-counter as needed  After sterile prep  with povidone-iodine solution and alcohol, the left heel was injected with 0.5cc 2% xylocaine plain, 0.5cc 0.5% marcaine plain, 5mg  triamcinolone acetonide, and 2mg  dexamethasone was injected along the medial plantar fascia at the insertion on the plantar calcaneus. The patient tolerated the procedure well without complication.   We also discussed treatment of her bilateral bunions.  We discussed surgical correction including Lapidus procedure versus minimally invasive correction including the pros and cons of each as well as the differences in the postoperative course.  She will consider this for the future we will take x-rays of the right foot as needed for planning for right foot surgery.  She will let me know when she is ready for surgery.   No follow-ups on file.

## 2020-09-21 ENCOUNTER — Other Ambulatory Visit (HOSPITAL_COMMUNITY): Payer: Self-pay

## 2020-09-24 ENCOUNTER — Other Ambulatory Visit (HOSPITAL_COMMUNITY): Payer: Self-pay

## 2020-10-03 ENCOUNTER — Other Ambulatory Visit (HOSPITAL_COMMUNITY): Payer: Self-pay

## 2020-10-22 ENCOUNTER — Other Ambulatory Visit: Payer: Self-pay

## 2020-10-22 ENCOUNTER — Ambulatory Visit (INDEPENDENT_AMBULATORY_CARE_PROVIDER_SITE_OTHER): Payer: No Typology Code available for payment source | Admitting: Podiatry

## 2020-10-22 DIAGNOSIS — M722 Plantar fascial fibromatosis: Secondary | ICD-10-CM | POA: Diagnosis not present

## 2020-10-22 NOTE — Progress Notes (Signed)
  Subjective:  Patient ID: [name], female    DOB: January 25, 1966,  MRN: [id number]  Chief Complaint  Patient presents with   Plantar Fasciitis    Left foot, 1 month follow up    55 y.o. female returns for follow-up with the above complaint. History confirmed with patient.  Was having some improvement of last injection but has returned  Objective:  Physical Exam: warm, good capillary refill, no trophic changes or ulcerative lesions, normal DP and PT pulses, and normal sensory exam.  Bilaterally she has hallux valgus with bunion formation with good range of motion.  No pain in the mid range of motion.  She has sharp pain on palpation of the plantar left heel at the insertion of the plantar fascia  Radiographs: X-ray of the left foot: no fracture, dislocation, swelling or degenerative changes noted, no plantar calcaneal spur, she has moderate hallux valgus deformity Assessment:   1. Plantar fasciitis of left foot      Plan:  Patient was evaluated and treated and all questions answered.  Discussed the etiology and treatment options for plantar fasciitis including stretching, formal physical therapy, supportive shoegears such as a running shoe or sneaker, pre fabricated orthoses, injection therapy, and oral medications. We also discussed the role of surgical treatment of this for patients who do not improve after exhausting non-surgical treatment options.   -Recommend CAM boot which I dispensed today she will have this so she can rest before her trip -Recommend repeat injection today  After sterile prep with povidone-iodine solution and alcohol, the left heel was injected with 0.5cc 2% xylocaine plain, 0.5cc 0.5% marcaine plain, 5mg  triamcinolone acetonide, and 2mg  dexamethasone was injected along the medial plantar fascia at the insertion on the plantar calcaneus. The patient tolerated the procedure well without complication.   No follow-ups on file.

## 2020-11-26 ENCOUNTER — Other Ambulatory Visit (HOSPITAL_COMMUNITY): Payer: Self-pay

## 2020-12-05 ENCOUNTER — Ambulatory Visit: Payer: No Typology Code available for payment source | Admitting: Podiatry

## 2021-01-01 ENCOUNTER — Encounter: Payer: No Typology Code available for payment source | Admitting: Family Medicine

## 2021-01-06 ENCOUNTER — Ambulatory Visit: Payer: No Typology Code available for payment source | Admitting: Podiatry

## 2021-01-16 ENCOUNTER — Other Ambulatory Visit (HOSPITAL_COMMUNITY): Payer: Self-pay

## 2021-01-16 MED ORDER — AMOXICILLIN 500 MG PO CAPS
ORAL_CAPSULE | ORAL | 0 refills | Status: DC
  Filled 2021-01-16: qty 30, 8d supply, fill #0

## 2021-01-17 ENCOUNTER — Other Ambulatory Visit (HOSPITAL_COMMUNITY): Payer: Self-pay

## 2021-01-28 ENCOUNTER — Other Ambulatory Visit: Payer: Self-pay

## 2021-01-28 ENCOUNTER — Other Ambulatory Visit (HOSPITAL_COMMUNITY): Payer: Self-pay

## 2021-01-28 ENCOUNTER — Encounter (HOSPITAL_COMMUNITY): Payer: Self-pay

## 2021-01-28 ENCOUNTER — Emergency Department (HOSPITAL_COMMUNITY)
Admission: EM | Admit: 2021-01-28 | Discharge: 2021-01-28 | Disposition: A | Discharge status: Home / Self Care | Payer: BC Managed Care – PPO | Attending: Emergency Medicine | Admitting: Emergency Medicine

## 2021-01-28 DIAGNOSIS — E876 Hypokalemia: Secondary | ICD-10-CM | POA: Insufficient documentation

## 2021-01-28 DIAGNOSIS — R110 Nausea: Secondary | ICD-10-CM | POA: Insufficient documentation

## 2021-01-28 DIAGNOSIS — E119 Type 2 diabetes mellitus without complications: Secondary | ICD-10-CM | POA: Diagnosis not present

## 2021-01-28 DIAGNOSIS — H8112 Benign paroxysmal vertigo, left ear: Secondary | ICD-10-CM | POA: Diagnosis not present

## 2021-01-28 DIAGNOSIS — Z79899 Other long term (current) drug therapy: Secondary | ICD-10-CM | POA: Insufficient documentation

## 2021-01-28 DIAGNOSIS — I10 Essential (primary) hypertension: Secondary | ICD-10-CM | POA: Insufficient documentation

## 2021-01-28 DIAGNOSIS — R42 Dizziness and giddiness: Secondary | ICD-10-CM | POA: Diagnosis not present

## 2021-01-28 DIAGNOSIS — H81399 Other peripheral vertigo, unspecified ear: Secondary | ICD-10-CM

## 2021-01-28 LAB — URINALYSIS, ROUTINE W REFLEX MICROSCOPIC
Bilirubin Urine: NEGATIVE
Glucose, UA: NEGATIVE mg/dL
Ketones, ur: NEGATIVE mg/dL
Leukocytes,Ua: NEGATIVE
Nitrite: NEGATIVE
Protein, ur: NEGATIVE mg/dL
Specific Gravity, Urine: 1.009 (ref 1.005–1.030)
pH: 7 (ref 5.0–8.0)

## 2021-01-28 LAB — COMPREHENSIVE METABOLIC PANEL
ALT: 22 U/L (ref 0–44)
AST: 24 U/L (ref 15–41)
Albumin: 3.9 g/dL (ref 3.5–5.0)
Alkaline Phosphatase: 270 U/L — ABNORMAL HIGH (ref 38–126)
Anion gap: 7 (ref 5–15)
BUN: 18 mg/dL (ref 6–20)
CO2: 30 mmol/L (ref 22–32)
Calcium: 9.5 mg/dL (ref 8.9–10.3)
Chloride: 100 mmol/L (ref 98–111)
Creatinine, Ser: 1.2 mg/dL — ABNORMAL HIGH (ref 0.44–1.00)
GFR, Estimated: 54 mL/min — ABNORMAL LOW (ref >60)
Glucose, Bld: 104 mg/dL — ABNORMAL HIGH (ref 70–99)
Potassium: 3.3 mmol/L — ABNORMAL LOW (ref 3.5–5.1)
Sodium: 137 mmol/L (ref 135–145)
Total Bilirubin: 0.4 mg/dL (ref 0.3–1.2)
Total Protein: 8.2 g/dL — ABNORMAL HIGH (ref 6.5–8.1)

## 2021-01-28 LAB — CBC
HCT: 40.6 % (ref 36.0–46.0)
Hemoglobin: 13.1 g/dL (ref 12.0–15.0)
MCH: 26.6 pg (ref 26.0–34.0)
MCHC: 32.3 g/dL (ref 30.0–36.0)
MCV: 82.5 fL (ref 80.0–100.0)
Platelets: 273 10*3/uL (ref 150–400)
RBC: 4.92 MIL/uL (ref 3.87–5.11)
RDW: 15.6 % — ABNORMAL HIGH (ref 11.5–15.5)
WBC: 6.3 10*3/uL (ref 4.0–10.5)
nRBC: 0 % (ref 0.0–0.2)

## 2021-01-28 MED ORDER — MECLIZINE HCL 25 MG PO TABS
25.0000 mg | ORAL_TABLET | Freq: Once | ORAL | Status: AC
  Administered 2021-01-28: 25 mg via ORAL
  Filled 2021-01-28: qty 1

## 2021-01-28 MED ORDER — MECLIZINE HCL 25 MG PO TABS
25.0000 mg | ORAL_TABLET | Freq: Three times a day (TID) | ORAL | 0 refills | Status: DC | PRN
  Filled 2021-01-28: qty 30, 10d supply, fill #0

## 2021-01-28 NOTE — ED Triage Notes (Signed)
Patient c/o dizziness and nausea that started today.

## 2021-01-28 NOTE — ED Notes (Signed)
Patient was able to ambulate to the restroom with my assistance. She said she was feeling dizzy so after she used the bathroom I brought her back to the room with a wheel chair. RN notified.

## 2021-01-28 NOTE — ED Provider Notes (Signed)
[facility] EMERGENCY DEPARTMENT Provider Note  CSN: [id number] Arrival date & time: 01/28/21 1016    History Chief Complaint  Patient presents with   Dizziness   Nausea    [name] is a 55 y.o. female reports she has been in her normal state of health recently without any URI symptoms or other complaints. This morning when she woke up she was feeling well but as she was getting ready for work, standing at her sink, she began to feel room-spinning dizziness and a wave of nausea. Symptoms improved and then returned before going away long enough for her to get to work. While at work, she had symptom onset again and so was directed to come to the ED. She denies any vomiting, no headaches, changes in hearing, no CP, SOB, abdominal pain, diarrhea or dysuria. She is feeling better at rest but still some dizziness when looking side to side. She has been seen for vertigo before, last was in May 2022 at PCP office where she was prescribed Meclizine but states her symptoms went away and she never took any of it.   Past Medical History:  Diagnosis Date   Anemia    Back pain    Clotting disorder ([place])    Diabetes mellitus without complication ([place])    Edema 04/25/2015   Headache(784.0)    Herniated disc    History of colonic polyps    Hypertension    Joint pain    Obesity    Osteoarthritis    Shortness of breath 04/25/2015   TIA (transient ischemic attack) 2007   hx of   Vasculitis (HCC)    L leg - Polyarthritis   Venous insufficiency    Vitamin D deficiency     Past Surgical History:  Procedure Laterality Date   ABDOMINAL HYSTERECTOMY     LAPAROSCOPIC GASTRIC SLEEVE RESECTION N/A 08/11/2016   Procedure: LAPAROSCOPIC GASTRIC SLEEVE RESECTION, UPPER ENDOSCOPY;  Surgeon: Arta Bruce Kinsinger, MD;  Location: [facility];  Service: General;  Laterality: N/A;    Family History  Problem Relation Age of Onset   Stroke Mother    Hypertension Mother    AAA (abdominal aortic aneurysm)  Mother    Hypertension Father    Cancer Father        Prostate   Arthritis Other    Hypertension Other    Stroke Other    Colon cancer Neg Hx    Esophageal cancer Neg Hx    Rectal cancer Neg Hx    Stomach cancer Neg Hx     Social History   Tobacco Use   Smoking status: Never   Smokeless tobacco: Never  Vaping Use   Vaping Use: Never used  Substance Use Topics   Alcohol use: Yes    Alcohol/week: 0.0 standard drinks    Comment: rarely   Drug use: No     Home Medications Prior to Admission medications   Medication Sig Start Date End Date Taking? Authorizing Provider  amoxicillin (AMOXIL) 500 MG capsule Take 1 capsule by mouth every 6 hours until gone 01/14/21     calcium carbonate (TUMS EX) 750 MG chewable tablet Chew 1,500 mg by mouth daily.    [provider]  carvedilol (COREG) 3.125 MG tablet Take 1 tablet (3.125 mg total) by mouth 2 (two) times daily with a meal. 08/02/20   [place], Betty G, MD  Cholecalciferol 1.25 MG (50000 UT) capsule Take 1 capsule (50,000 Units total) by mouth once a week.  11/29/19   Janith Lima, MD  indapamide (LOZOL) 1.25 MG tablet Take 1 tablet (1.25 mg total) by mouth daily. 08/02/20   Martin[place], Betty G, MD  meclizine (ANTIVERT) 25 MG tablet Take 1 tablet (25 mg total) by mouth 3 (three) times daily as needed for dizziness. 01/28/21   Truddie Hidden, MD  Multiple Vitamin (MULTIVITAMIN) tablet Take 1 tablet by mouth daily.    [provider]  pravastatin (PRAVACHOL) 10 MG tablet Take 1 tablet (10 mg total) by mouth daily. 09/03/20   Martinique, Betty G, MD  thiamine 100 MG tablet Take 1 tablet (100 mg total) by mouth every other day. 05/18/19   Janith Lima, MD  tiZANidine (ZANAFLEX) 2 MG tablet Take 1 tablet (2 mg total) by mouth every 8 (eight) hours as needed for muscle spasms. 12/13/19   Janith Lima, MD     Allergies    Patient has no known allergies.   Review of Systems   Review of Systems A comprehensive review of  systems was completed and negative except as noted in HPI.    Physical Exam BP (!) 148/88   Pulse 66   Temp 98.2 F (36.8 C) (Oral)   Resp 16   Ht 5\' 1"  (1.549 m)   Wt 118.8 kg   SpO2 100%   BMI 49.50 kg/m   Physical Exam Vitals and nursing note reviewed.  Constitutional:      Appearance: Normal appearance.  HENT:     Head: Normocephalic and atraumatic.     Right Ear: Tympanic membrane and ear canal normal.     Left Ear: Tympanic membrane and ear canal normal.     Nose: Nose normal.     Mouth/Throat:     Mouth: Mucous membranes are moist.  Eyes:     Extraocular Movements: Extraocular movements intact.     Conjunctiva/sclera: Conjunctivae normal.  Cardiovascular:     Rate and Rhythm: Normal rate.  Pulmonary:     Effort: Pulmonary effort is normal.     Breath sounds: Normal breath sounds.  Abdominal:     General: Abdomen is flat.     Palpations: Abdomen is soft.     Tenderness: There is no abdominal tenderness.  Musculoskeletal:        General: No swelling. Normal range of motion.     Cervical back: Neck supple.  Skin:    General: Skin is warm and dry.  Neurological:     General: No focal deficit present.     Mental Status: She is alert and oriented to person, place, and time.     Cranial Nerves: No cranial nerve deficit.     Sensory: No sensory deficit.     Motor: No weakness.     Coordination: Coordination normal.     Comments: Nystagmus with L lateral gaze induces vertigo symptoms  Psychiatric:        Mood and Affect: Mood normal.     ED Results / Procedures / Treatments   Labs (all labs ordered are listed, but only abnormal results are displayed) Labs Reviewed  COMPREHENSIVE METABOLIC PANEL - Abnormal; Notable for the following components:      Result Value   Potassium 3.3 (*)    Glucose, Bld 104 (*)    Creatinine, Ser 1.20 (*)    Total Protein 8.2 (*)    Alkaline Phosphatase 270 (*)    GFR, Estimated 54 (*)    All other components within normal  limits  CBC - Abnormal; Notable for the following components:   RDW 15.6 (*)    All other components within normal limits  URINALYSIS, ROUTINE W REFLEX MICROSCOPIC - Abnormal; Notable for the following components:   Color, Urine STRAW (*)    Hgb urine dipstick SMALL (*)    Bacteria, UA RARE (*)    All other components within normal limits    EKG None  Radiology No results found.  Procedures Procedures  Medications Ordered in the ED Medications  meclizine (ANTIVERT) tablet 25 mg (25 mg Oral Given 01/28/21 1135)     MDM Rules/Calculators/A&P MDM  Patient with symptoms of peripheral vertigo. Will check basic labs as she has history of anemia and CKD and hasn't had them checked in a while. Meclizine for symptoms.  ED Course  I have reviewed the triage vital signs and the nursing notes.  Pertinent labs & imaging results that were available during my care of the patient were reviewed by me and considered in my medical decision making (see chart for details).  Clinical Course as of 01/28/21 1306  Tue Jan 28, 2021  1114 CBC is normal.  [CS]  1610 UA is clear.  [CS]  9604 CMP with CKD, mild hypokalemia. Otherwise unremarkable.  [CS]  5409 Patient feeling better, will attempt ambulation and reassess.  [CS]  8119 Patient able to walk but still feeling unsteady. She is comfortable going home to manage her symptoms however. I have low suspicion for central vertigo. Meclizine for symptoms. ENT referral, RTED for any other concerns or if symptoms worsen to the point she can no longer manage at home.  [CS]    Clinical Course User Index [CS] Truddie Hidden, MD    Final Clinical Impression(s) / ED Diagnoses Final diagnoses:  Peripheral vertigo, unspecified laterality  Benign paroxysmal positional vertigo of left ear    Rx / DC Orders ED Discharge Orders          Ordered    meclizine (ANTIVERT) 25 MG tablet  3 times daily PRN        01/28/21 1305              Truddie Hidden, MD 01/28/21 1306

## 2021-01-29 NOTE — Progress Notes (Signed)
HPI: [name] is a 55 y.o. female, who is here today for her routine physical.  Last CPE: 05/01/19  Regular exercise 3 or more time per week: She was walking until a few weeks ago because plantar fascitis. She has not been consistent. Following a healthy diet: She is cooking at home, not much vegetables. Chicken and pork chops in the air fryer and oven. Snacking frequently and eating sweets, not counting carb.She has gained wt.  She lives with her husband.  Chronic medical problems: DM II,CKD III,HLD, elevated alk phophatase,and CKD III among some.  Immunization History  Administered Date(s) Administered   Influenza Split 01/07/2011, 01/20/2012   Influenza Whole 02/27/2009   Influenza,inj,Quad PF,6+ Mos 12/30/2012, 01/10/2014, 01/09/2015, 12/05/2015, 01/11/2017, 12/16/2017, 01/26/2019, 01/31/2021   PFIZER(Purple Top)SARS-COV-2 Vaccination 04/28/2019, 05/19/2019, 02/10/2020   Pneumococcal Polysaccharide-23 11/29/2019   Tdap 03/26/2011, 01/31/2021   Zoster Recombinat (Shingrix) 11/29/2019, 01/29/2020   Mammogram: Current, done at her gyn's office. Colonoscopy: 03/02/2018 recall in 5 years DEXA: N/A Hep C screening: 07/13/20 NR  She has no new concerns today.  DM II: She is on non pharmacologic treatment. She is not checking BS's. Her husband is on Ozempic and has helped with wt loss, she would like to try.  Lab Results  Component Value Date   HGBA1C 5.8 08/02/2020   HLD: She is not on statin. Pravastatin has been recommended. Lab Results  Component Value Date   CHOL 179 05/11/2019   HDL 56.80 05/11/2019   LDLCALC 105 (H) 05/11/2019   TRIG 84.0 05/11/2019   CHOLHDL 3 05/11/2019   25 OH vit D 24 in 07/2020. She is not on vit D supplementation.  HTN on Indapamide  1.25 mg daily and Carvedilol 3.125 mg bid. Mild hypoK+ during recent ED visit. Evaluated on 01/28/21 because episode of vertigo. She is on Meclizine 25 mg. Lab Results  Component Value Date    CREATININE 1.20 (H) 01/28/2021   BUN 18 01/28/2021   NA 137 01/28/2021   K 3.3 (L) 01/28/2021   CL 100 01/28/2021   CO2 30 01/28/2021   Review of Systems  Constitutional:  Negative for appetite change and fever.  HENT:  Negative for hearing loss, mouth sores, sore throat, trouble swallowing and voice change.   Eyes:  Negative for redness and visual disturbance.  Respiratory:  Negative for cough, shortness of breath and wheezing.   Cardiovascular:  Negative for chest pain, palpitations and leg swelling.  Gastrointestinal:  Negative for abdominal pain, nausea and vomiting.       No changes in bowel habits.  Endocrine: Negative for cold intolerance, heat intolerance, polydipsia, polyphagia and polyuria.  Genitourinary:  Negative for decreased urine volume, dysuria, hematuria, vaginal bleeding and vaginal discharge.  Musculoskeletal:  Negative for gait problem and myalgias.  Skin:  Negative for color change and rash.  Neurological:  Negative for syncope, weakness and headaches.  Hematological:  Negative for adenopathy. Does not bruise/bleed easily.  Psychiatric/Behavioral:  Negative for confusion.   All other systems reviewed and are negative.  Current Outpatient Medications on File Prior to Visit  Medication Sig Dispense Refill   calcium carbonate (TUMS EX) 750 MG chewable tablet Chew 1,500 mg by mouth daily.     carvedilol (COREG) 3.125 MG tablet Take 1 tablet (3.125 mg total) by mouth 2 (two) times daily with a meal. 180 tablet 2   Cholecalciferol 1.25 MG (50000 UT) capsule Take 1 capsule (50,000 Units total) by mouth once a week. 12 capsule  0   indapamide (LOZOL) 1.25 MG tablet Take 1 tablet (1.25 mg total) by mouth daily. 90 tablet 2   meclizine (ANTIVERT) 25 MG tablet Take 1 tablet by mouth 3 times daily as needed for dizziness. 30 tablet 0   Multiple Vitamin (MULTIVITAMIN) tablet Take 1 tablet by mouth daily.     pravastatin (PRAVACHOL) 10 MG tablet Take 1 tablet (10 mg total) by  mouth daily. 90 tablet 3   thiamine 100 MG tablet Take 1 tablet (100 mg total) by mouth every other day. 45 tablet 1   tiZANidine (ZANAFLEX) 2 MG tablet Take 1 tablet (2 mg total) by mouth every 8 (eight) hours as needed for muscle spasms. 90 tablet 1   No current facility-administered medications on file prior to visit.   Past Medical History:  Diagnosis Date   Anemia    Back pain    Clotting disorder ([place])    Diabetes mellitus without complication ([place])    Edema 04/25/2015   Headache(784.0)    Herniated disc    History of colonic polyps    Hypertension    Joint pain    Obesity    Osteoarthritis    Shortness of breath 04/25/2015   TIA (transient ischemic attack) 2007   hx of   Vasculitis (HCC)    L leg - Polyarthritis   Venous insufficiency    Vitamin D deficiency    Past Surgical History:  Procedure Laterality Date   ABDOMINAL HYSTERECTOMY     LAPAROSCOPIC GASTRIC SLEEVE RESECTION N/A 08/11/2016   Procedure: LAPAROSCOPIC GASTRIC SLEEVE RESECTION, UPPER ENDOSCOPY;  Surgeon: Arta Bruce Kinsinger, MD;  Location: [facility];  Service: General;  Laterality: N/A;   No Known Allergies  Family History  Problem Relation Age of Onset   Stroke Mother    Hypertension Mother    AAA (abdominal aortic aneurysm) Mother    Hypertension Father    Cancer Father        Prostate   Arthritis Other    Hypertension Other    Stroke Other    Colon cancer Neg Hx    Esophageal cancer Neg Hx    Rectal cancer Neg Hx    Stomach cancer Neg Hx    Social History   Socioeconomic History   Marital status: Married    Spouse name: [name]   Number of children: Not on file   Years of education: Not on file   Highest education level: Not on file  Occupational History   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  Tobacco Use   Smoking status: Never   Smokeless tobacco: Never  Vaping Use   Vaping Use: Never used  Substance and Sexual Activity   Alcohol use: Yes    Alcohol/week: 0.0 standard drinks     Comment: rarely   Drug use: No   Sexual activity: Yes    Birth control/protection: Surgical  Other Topics Concern   Not on file  Social History Narrative   Not on file   Social Determinants of Health   Financial Resource Strain: Not on file  Food Insecurity: Not on file  Transportation Needs: Not on file  Physical Activity: Not on file  Stress: Not on file  Social Connections: Not on file   Vitals:   01/31/21 0909  BP: 128/80  Pulse: 65  Resp: 16  Temp: 98.1 F (36.7 C)  SpO2: 98%   Wt Readings from Last 3 Encounters:  01/31/21 262 lb 4 oz (119 kg)  01/28/21 262 lb (118.8  kg)  09/03/20 258 lb 4 oz (117.1 kg)   Body mass index is 49.55 kg/m.  Wt Readings from Last 3 Encounters:  01/31/21 262 lb 4 oz (119 kg)  01/28/21 262 lb (118.8 kg)  09/03/20 258 lb 4 oz (117.1 kg)   Physical Exam Vitals and nursing note reviewed.  Constitutional:      General: She is not in acute distress.    Appearance: She is well-developed.  HENT:     Head: Normocephalic and atraumatic.     Right Ear: Hearing, tympanic membrane, ear canal and external ear normal.     Left Ear: Hearing, tympanic membrane, ear canal and external ear normal.     Mouth/Throat:     Mouth: Mucous membranes are moist.     Pharynx: Oropharynx is clear. Uvula midline.  Eyes:     Extraocular Movements: Extraocular movements intact.     Conjunctiva/sclera: Conjunctivae normal.     Pupils: Pupils are equal, round, and reactive to light.  Neck:     Thyroid: No thyromegaly.     Trachea: No tracheal deviation.  Cardiovascular:     Rate and Rhythm: Normal rate and regular rhythm.     Pulses:          Dorsalis pedis pulses are 2+ on the right side and 2+ on the left side.     Heart sounds: No murmur heard. Pulmonary:     Effort: Pulmonary effort is normal. No respiratory distress.     Breath sounds: Normal breath sounds.  Abdominal:     Palpations: Abdomen is soft. There is no hepatomegaly or mass.      Tenderness: There is no abdominal tenderness.  Genitourinary:    Comments: Deferred to gyn. Musculoskeletal:       Back:     Comments: No signs of synovitis appreciated.  Lymphadenopathy:     Cervical: No cervical adenopathy.     Upper Body:     Right upper body: No supraclavicular adenopathy.     Left upper body: No supraclavicular adenopathy.  Skin:    General: Skin is warm.     Findings: No erythema or rash.  Neurological:     General: No focal deficit present.     Mental Status: She is alert and oriented to person, place, and time.     Cranial Nerves: No cranial nerve deficit.     Coordination: Coordination normal.     Gait: Gait normal.     Deep Tendon Reflexes:     Reflex Scores:      Bicep reflexes are 2+ on the right side and 2+ on the left side.      Patellar reflexes are 2+ on the right side and 2+ on the left side. Psychiatric:        Speech: Speech normal.     Comments: Well groomed, good eye contact.  ASSESSMENT AND PLAN:  [name] was here today annual physical examination.  Orders Placed This Encounter  Procedures   Flu Vaccine QUAD 57moIM (Fluarix, Fluzone & Alfiuria Quad PF)   Tdap vaccine greater than or equal to 7yo IM   Basic metabolic panel   Lipid panel   VITAMIN D 25 Hydroxy (Vit-D Deficiency, Fractures)   POC HgB A1c   Lab Results  Component Value Date   CHOL 205 (H) 01/31/2021   HDL 70.40 01/31/2021   LDLCALC 116 (H) 01/31/2021   TRIG 93.0 01/31/2021   CHOLHDL 3 01/31/2021   Lab Results  Component Value Date   CREATININE 1.21 (H) 01/31/2021   BUN 20 01/31/2021   NA 139 01/31/2021   K 3.5 01/31/2021   CL 100 01/31/2021   CO2 32 01/31/2021   Lab Results  Component Value Date   HGBA1C 6.0 01/31/2021   Routine general medical examination at a health care facility We discussed the importance of regular physical activity and healthy diet for prevention of chronic illness and/or complications. Preventive guidelines  reviewed. Continue her female preventive care with gyn. Vaccination up to date. Ca++ and vit D supplementation recommended. Next CPE in a year.  Benign paroxysmal positional vertigo of left ear Continue Meclizine 25 mg tid prn, some side effects discussed. Instructed about warning signs.  Need for influenza vaccination -     Flu Vaccine QUAD 40moIM (Fluarix, Fluzone & Alfiuria Quad PF)  Hypokalemia Mild. Indapamide may need to be discontinued if problem is persistent. K+ rich diet recommended for now.  Need for Tdap vaccination -     Tdap vaccine greater than or equal to 7yo IM  Morbid obesity ([place] We discussed benefits of wt loss as well as adverse effects of obesity. Consistency with healthy diet and physical activity recommended. Ozempic started today, 0.25 mg weekly, instructed to double dose q 2-3 weeks as toelrated to a goal of 1 mg weekly.  Vitamin D insufficiency She is not on vit D supplementation. Further recommendations according to 25 OH vit d result.  Type 2 diabetes mellitus with hyperglycemia, without long-term current use of insulin (HCC) HgA1C at goal. Ozempic added today mainly to help with wt loss. Annual eye exam, periodic dental and foot care recommended. F/U in 4-5 months   Hyperlipidemia associated with type 2 diabetes mellitus ([place] She is not on statin, we discussed CV benefits.   Essential hypertension, benign BP adequately controlled. We discussed some side effects of meds, no changes for now.  Return in 4 months (on 06/03/2021) for wt,HTN.  Jovahn Breit G. JMartinique MD  [facility].

## 2021-01-31 ENCOUNTER — Other Ambulatory Visit: Payer: Self-pay

## 2021-01-31 ENCOUNTER — Ambulatory Visit (INDEPENDENT_AMBULATORY_CARE_PROVIDER_SITE_OTHER): Payer: BC Managed Care – PPO | Admitting: Family Medicine

## 2021-01-31 ENCOUNTER — Other Ambulatory Visit (HOSPITAL_COMMUNITY): Payer: Self-pay

## 2021-01-31 ENCOUNTER — Encounter: Payer: Self-pay | Admitting: Family Medicine

## 2021-01-31 VITALS — BP 128/80 | HR 65 | Temp 98.1°F | Resp 16 | Ht 61.0 in | Wt 262.2 lb

## 2021-01-31 DIAGNOSIS — E1169 Type 2 diabetes mellitus with other specified complication: Secondary | ICD-10-CM | POA: Diagnosis not present

## 2021-01-31 DIAGNOSIS — Z23 Encounter for immunization: Secondary | ICD-10-CM | POA: Diagnosis not present

## 2021-01-31 DIAGNOSIS — E785 Hyperlipidemia, unspecified: Secondary | ICD-10-CM

## 2021-01-31 DIAGNOSIS — E876 Hypokalemia: Secondary | ICD-10-CM

## 2021-01-31 DIAGNOSIS — Z0000 Encounter for general adult medical examination without abnormal findings: Secondary | ICD-10-CM | POA: Diagnosis not present

## 2021-01-31 DIAGNOSIS — H8112 Benign paroxysmal vertigo, left ear: Secondary | ICD-10-CM | POA: Diagnosis not present

## 2021-01-31 DIAGNOSIS — E1165 Type 2 diabetes mellitus with hyperglycemia: Secondary | ICD-10-CM | POA: Diagnosis not present

## 2021-01-31 DIAGNOSIS — I10 Essential (primary) hypertension: Secondary | ICD-10-CM | POA: Diagnosis not present

## 2021-01-31 DIAGNOSIS — E559 Vitamin D deficiency, unspecified: Secondary | ICD-10-CM

## 2021-01-31 LAB — POCT GLYCOSYLATED HEMOGLOBIN (HGB A1C): HbA1c, POC (prediabetic range): 6 % (ref 5.7–6.4)

## 2021-01-31 LAB — BASIC METABOLIC PANEL
BUN: 20 mg/dL (ref 6–23)
CO2: 32 mEq/L (ref 19–32)
Calcium: 9.8 mg/dL (ref 8.4–10.5)
Chloride: 100 mEq/L (ref 96–112)
Creatinine, Ser: 1.21 mg/dL — ABNORMAL HIGH (ref 0.40–1.20)
GFR: 51 mL/min — ABNORMAL LOW (ref >60.00)
Glucose, Bld: 103 mg/dL — ABNORMAL HIGH (ref 70–99)
Potassium: 3.5 mEq/L (ref 3.5–5.1)
Sodium: 139 mEq/L (ref 135–145)

## 2021-01-31 LAB — VITAMIN D 25 HYDROXY (VIT D DEFICIENCY, FRACTURES): VITD: 28.19 ng/mL — ABNORMAL LOW (ref 30.00–100.00)

## 2021-01-31 LAB — LIPID PANEL
Cholesterol: 205 mg/dL — ABNORMAL HIGH (ref 0–200)
HDL: 70.4 mg/dL (ref >39.00)
LDL Cholesterol: 116 mg/dL — ABNORMAL HIGH (ref 0–99)
NonHDL: 134.27
Total CHOL/HDL Ratio: 3
Triglycerides: 93 mg/dL (ref 0.0–149.0)
VLDL: 18.6 mg/dL (ref 0.0–40.0)

## 2021-01-31 MED ORDER — OZEMPIC (0.25 OR 0.5 MG/DOSE) 2 MG/1.5ML ~~LOC~~ SOPN
PEN_INJECTOR | SUBCUTANEOUS | 1 refills | Status: DC
  Filled 2021-01-31: qty 1.5, 56d supply, fill #0
  Filled 2021-02-13: qty 1.5, 28d supply, fill #0
  Filled 2021-03-08 – 2021-03-25 (×2): qty 1.5, 28d supply, fill #1
  Filled 2021-05-13: qty 1.5, 28d supply, fill #2

## 2021-01-31 NOTE — Assessment & Plan Note (Addendum)
HgA1C at goal. Ozempic added today mainly to help with wt loss. Annual eye exam, periodic dental and foot care recommended. F/U in 4-5 months

## 2021-01-31 NOTE — Assessment & Plan Note (Signed)
BP adequately controlled. We discussed some side effects of meds, no changes for now.

## 2021-01-31 NOTE — Patient Instructions (Addendum)
A few things to remember from today's visit:  Routine general medical examination at a health care facility  Type 2 diabetes mellitus with hyperglycemia, without long-term current use of insulin ([place]) - Plan: POC HgB A1c, Semaglutide,0.25 or 0.5MG /DOS, (OZEMPIC, 0.25 OR 0.5 MG/DOSE,) 2 MG/1.5ML SOPN  Hyperlipidemia associated with type 2 diabetes mellitus ([place]) - Plan: Lipid panel  Benign paroxysmal positional vertigo of left ear  Essential hypertension, benign - Plan: Basic metabolic panel  Need for influenza vaccination - Plan: Flu Vaccine QUAD 81mo+IM (Fluarix, Fluzone & Alfiuria Quad PF)  Vitamin D insufficiency - Plan: VITAMIN D 25 Hydroxy (Vit-D Deficiency, Fractures)  Hypokalemia  Today Ozempic started to help with wt loss. Start with 0,25 mg and titrate dose up every 2-3 weeks as tolerated.  If you need refills please call your pharmacy. Do not use My Chart to request refills or for acute issues that need immediate attention.   Please be sure medication list is accurate. If a new problem present, please set up appointment sooner than planned today.  Health Maintenance, Female Adopting a healthy lifestyle and getting preventive care are important in promoting health and wellness. Ask your health care provider about: The right schedule for you to have regular tests and exams. Things you can do on your own to prevent diseases and keep yourself healthy. What should I know about diet, weight, and exercise? Eat a healthy diet  Eat a diet that includes plenty of vegetables, fruits, low-fat dairy products, and lean protein. Do not eat a lot of foods that are high in solid fats, added sugars, or sodium. Maintain a healthy weight Body mass index (BMI) is used to identify weight problems. It estimates body fat based on height and weight. Your health care provider can help determine your BMI and help you achieve or maintain a healthy weight. Get regular exercise Get regular  exercise. This is one of the most important things you can do for your health. Most adults should: Exercise for at least 150 minutes each week. The exercise should increase your heart rate and make you sweat (moderate-intensity exercise). Do strengthening exercises at least twice a week. This is in addition to the moderate-intensity exercise. Spend less time sitting. Even light physical activity can be beneficial. Watch cholesterol and blood lipids Have your blood tested for lipids and cholesterol at 54 years of age, then have this test every 5 years. Have your cholesterol levels checked more often if: Your lipid or cholesterol levels are high. You are older than 54 years of age. You are at high risk for heart disease. What should I know about cancer screening? Depending on your health history and family history, you may need to have cancer screening at various ages. This may include screening for: Breast cancer. Cervical cancer. Colorectal cancer. Skin cancer. Lung cancer. What should I know about heart disease, diabetes, and high blood pressure? Blood pressure and heart disease High blood pressure causes heart disease and increases the risk of stroke. This is more likely to develop in people who have high blood pressure readings, are of African descent, or are overweight. Have your blood pressure checked: Every 3-5 years if you are 54 years of age. Every year if you are 54 years old or older. Diabetes Have regular diabetes screenings. This checks your fasting blood sugar level. Have the screening done: Once every three years after age 34 if you are at a normal weight and have a low risk for diabetes. More often and at  a younger age if you are overweight or have a high risk for diabetes. What should I know about preventing infection? Hepatitis B If you have a higher risk for hepatitis B, you should be screened for this virus. Talk with your health care provider to find out if you are  at risk for hepatitis B infection. Hepatitis C Testing is recommended for: Everyone born from [birth year] through 1964. Anyone with known risk factors for hepatitis C. Sexually transmitted infections (STIs) Get screened for STIs, including gonorrhea and chlamydia, if: You are sexually active and are younger than 54 years of age. You are older than 54 years of age and your health care provider tells you that you are at risk for this type of infection. Your sexual activity has changed since you were last screened, and you are at increased risk for chlamydia or gonorrhea. Ask your health care provider if you are at risk. Ask your health care provider about whether you are at high risk for HIV. Your health care provider may recommend a prescription medicine to help prevent HIV infection. If you choose to take medicine to prevent HIV, you should first get tested for HIV. You should then be tested every 3 months for as long as you are taking the medicine. Pregnancy If you are about to stop having your period (premenopausal) and you may become pregnant, seek counseling before you get pregnant. Take 400 to 800 micrograms (mcg) of folic acid every day if you become pregnant. Ask for birth control (contraception) if you want to prevent pregnancy. Osteoporosis and menopause Osteoporosis is a disease in which the bones lose minerals and strength with aging. This can result in bone fractures. If you are 1 years old or older, or if you are at risk for osteoporosis and fractures, ask your health care provider if you should: Be screened for bone loss. Take a calcium or vitamin D supplement to lower your risk of fractures. Be given hormone replacement therapy (HRT) to treat symptoms of menopause. Follow these instructions at home: Lifestyle Do not use any products that contain nicotine or tobacco, such as cigarettes, e-cigarettes, and chewing tobacco. If you need help quitting, ask your health care provider. Do not  use street drugs. Do not share needles. Ask your health care provider for help if you need support or information about quitting drugs. Alcohol use Do not drink alcohol if: Your health care provider tells you not to drink. You are pregnant, may be pregnant, or are planning to become pregnant. If you drink alcohol: Limit how much you use to 0-1 drink a day. Limit intake if you are breastfeeding. Be aware of how much alcohol is in your drink. In the [place] one drink equals one 12 oz bottle of beer (355 mL), one 5 oz glass of wine (148 mL), or one 1 oz glass of hard liquor (44 mL). General instructions Schedule regular health, dental, and eye exams. Stay current with your vaccines. Tell your health care provider if: You often feel depressed. You have ever been abused or do not feel safe at home. Summary Adopting a healthy lifestyle and getting preventive care are important in promoting health and wellness. Follow your health care provider's instructions about healthy diet, exercising, and getting tested or screened for diseases. Follow your health care provider's instructions on monitoring your cholesterol and blood pressure. This information is not intended to replace advice given to you by your health care provider. Make sure you discuss any questions you have  with your health care provider. Document Revised: 06/07/2020 Document Reviewed: 03/23/2018 Elsevier Patient Education  2022 [employer].

## 2021-01-31 NOTE — Assessment & Plan Note (Signed)
She is not on vit D supplementation. Further recommendations according to 25 OH vit d result.

## 2021-01-31 NOTE — Assessment & Plan Note (Signed)
She is not on statin, we discussed CV benefits.

## 2021-02-01 ENCOUNTER — Other Ambulatory Visit (HOSPITAL_COMMUNITY): Payer: Self-pay

## 2021-02-01 MED ORDER — PRAVASTATIN SODIUM 20 MG PO TABS
20.0000 mg | ORAL_TABLET | Freq: Every day | ORAL | 3 refills | Status: DC
  Filled 2021-02-01: qty 90, 90d supply, fill #0

## 2021-02-01 MED ORDER — CARVEDILOL 3.125 MG PO TABS
3.1250 mg | ORAL_TABLET | Freq: Two times a day (BID) | ORAL | 2 refills | Status: DC
Start: 2021-02-01 — End: 2022-02-10
  Filled 2021-02-01: qty 180, 90d supply, fill #0
  Filled 2021-03-08: qty 60, 30d supply, fill #0
  Filled 2021-03-25: qty 180, 90d supply, fill #0
  Filled 2021-07-02: qty 180, 90d supply, fill #1
  Filled 2021-10-17: qty 180, 90d supply, fill #2

## 2021-02-01 MED ORDER — INDAPAMIDE 1.25 MG PO TABS
1.2500 mg | ORAL_TABLET | Freq: Every day | ORAL | 2 refills | Status: DC
  Filled 2021-02-01 – 2021-03-25 (×3): qty 90, 90d supply, fill #0
  Filled 2021-07-02: qty 90, 90d supply, fill #1
  Filled 2021-10-17: qty 90, 90d supply, fill #2

## 2021-02-03 ENCOUNTER — Other Ambulatory Visit (HOSPITAL_COMMUNITY): Payer: Self-pay

## 2021-02-04 ENCOUNTER — Other Ambulatory Visit (HOSPITAL_COMMUNITY): Payer: Self-pay

## 2021-02-07 ENCOUNTER — Encounter: Payer: No Typology Code available for payment source | Admitting: Family Medicine

## 2021-02-10 ENCOUNTER — Other Ambulatory Visit (HOSPITAL_COMMUNITY): Payer: Self-pay

## 2021-02-12 ENCOUNTER — Other Ambulatory Visit (HOSPITAL_COMMUNITY): Payer: Self-pay

## 2021-02-13 ENCOUNTER — Other Ambulatory Visit (HOSPITAL_COMMUNITY): Payer: Self-pay

## 2021-03-08 ENCOUNTER — Other Ambulatory Visit (HOSPITAL_COMMUNITY): Payer: Self-pay

## 2021-03-10 ENCOUNTER — Other Ambulatory Visit (HOSPITAL_COMMUNITY): Payer: Self-pay

## 2021-03-14 ENCOUNTER — Other Ambulatory Visit (HOSPITAL_COMMUNITY): Payer: Self-pay

## 2021-03-18 ENCOUNTER — Other Ambulatory Visit (HOSPITAL_COMMUNITY): Payer: Self-pay

## 2021-03-18 ENCOUNTER — Encounter: Payer: Self-pay | Admitting: Family Medicine

## 2021-03-25 ENCOUNTER — Other Ambulatory Visit (HOSPITAL_COMMUNITY): Payer: Self-pay

## 2021-05-13 ENCOUNTER — Other Ambulatory Visit (HOSPITAL_COMMUNITY): Payer: Self-pay

## 2021-05-14 DIAGNOSIS — H43812 Vitreous degeneration, left eye: Secondary | ICD-10-CM | POA: Diagnosis not present

## 2021-05-14 DIAGNOSIS — H538 Other visual disturbances: Secondary | ICD-10-CM | POA: Diagnosis not present

## 2021-06-03 NOTE — Progress Notes (Signed)
[name] is a 56 y.o.female, who is here today for follow up.  Last follow up visit: 01/31/21.  Hypertension: Dx'ed 02/2017.  Medications:Carvedilol 3.125 mg bid and Indapamide 1.25 mg daily. BP readings at home:No checking. Side effects:None. Negative for unusual or severe headache, visual changes, exertional chest pain, dyspnea,  focal weakness, or edema.  CKD III: She has not noted gross hematuria,foam in urine,or decreased urine output.  Lab Results  Component Value Date   CREATININE 1.21 (H) 01/31/2021   BUN 20 01/31/2021   NA 139 01/31/2021   K 3.5 01/31/2021   CL 100 01/31/2021   CO2 32 01/31/2021   Diabetes Mellitus II: Dx'ed 09/2014. - Checking BG at home: Not checking. - Medications: Ozempic 0.5 mg weekly.Medication started mainly to help with wt loss. She has tolerated well and it has helped with appetite. - Compliance: Good - Diet: She made some changes and decreased sugar added foods, decreased sodas and increased water. Still eating plenty of vegetables.  - Exercise: 30 min daily walking. - eye exam: 3 weeks ago. - foot exam: 01/2021. - Negative for symptoms of hypoglycemia, polyuria, polydipsia, numbness extremities, foot ulcers/trauma  Lab Results  Component Value Date   HGBA1C 6.0 01/31/2021   Lab Results  Component Value Date   MICROALBUR <0.2 08/02/2020   She forgot to pick up Pravastatin. Following low fat diet. Lab Results  Component Value Date   CHOL 205 (H) 01/31/2021   HDL 70.40 01/31/2021   LDLCALC 116 (H) 01/31/2021   TRIG 93.0 01/31/2021   CHOLHDL 3 01/31/2021   Waking up in the middle of the night "gasping for air." She drinks water at bedtime, she has no symptoms when she does not eat or drink water at bedtime.  Elevated alk phosphatase for years. Cholelithiasis seen on abdominal US on 08/19/20. Negative for abdominal pain,nausea,or jaundice. Lab Results  Component Value Date   ALT 22 01/28/2021   AST 24 01/28/2021    ALKPHOS 270 (H) 01/28/2021   BILITOT 0.4 01/28/2021   States that she has seen surgeon since her last visit and planning on cholecystectomy.  Frontal cyst: Slowly growing for years. According to pt, she has told lesion cannot be removed by surgeon because of localization.  Review of Systems  Constitutional:  Negative for activity change, appetite change and fever.  HENT:  Negative for mouth sores, nosebleeds, sore throat and trouble swallowing.   Respiratory:  Negative for cough and wheezing.   Gastrointestinal:  Negative for vomiting.       Negative for changes in bowel habits.  Musculoskeletal:  Negative for gait problem and myalgias.  Skin:  Negative for pallor and rash.  Neurological:  Negative for syncope, facial asymmetry and weakness.  Rest see pertinent positives and negatives per HPI.  Current Outpatient Medications on File Prior to Visit  Medication Sig Dispense Refill   calcium carbonate (TUMS EX) 750 MG chewable tablet Chew 1,500 mg by mouth daily.     carvedilol (COREG) 3.125 MG tablet Take 1 tablet (3.125 mg total) by mouth 2 (two) times daily with a meal. 180 tablet 2   indapamide (LOZOL) 1.25 MG tablet Take 1 tablet (1.25 mg total) by mouth daily. 90 tablet 2   meclizine (ANTIVERT) 25 MG tablet Take 1 tablet by mouth 3 times daily as needed for dizziness. 30 tablet 0   Multiple Vitamin (MULTIVITAMIN) tablet Take 1 tablet by mouth daily.     tiZANidine (ZANAFLEX) 2 MG tablet Take  1 tablet (2 mg total) by mouth every 8 (eight) hours as needed for muscle spasms. 90 tablet 1   No current facility-administered medications on file prior to visit.   Past Medical History:  Diagnosis Date   Anemia    Back pain    Clotting disorder ([place])    Diabetes mellitus without complication ([place])    Edema 04/25/2015   Headache(784.0)    Herniated disc    History of colonic polyps    Hypertension    Joint pain    Obesity    Osteoarthritis    Shortness of breath 04/25/2015   TIA  (transient ischemic attack) 2007   hx of   Vasculitis (HCC)    L leg - Polyarthritis   Venous insufficiency    Vitamin D deficiency    No Known Allergies  Social History   Socioeconomic History   Marital status: Married    Spouse name: Elberta Fortis   Number of children: Not on file   Years of education: Not on file   Highest education level: Not on file  Occupational History   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  Tobacco Use   Smoking status: Never   Smokeless tobacco: Never  Vaping Use   Vaping Use: Never used  Substance and Sexual Activity   Alcohol use: Yes    Alcohol/week: 0.0 standard drinks    Comment: rarely   Drug use: No   Sexual activity: Yes    Birth control/protection: Surgical  Other Topics Concern   Not on file  Social History Narrative   Not on file   Social Determinants of Health   Financial Resource Strain: Not on file  Food Insecurity: Not on file  Transportation Needs: Not on file  Physical Activity: Not on file  Stress: Not on file  Social Connections: Not on file   Vitals:   06/04/21 0954  BP: 118/80  Pulse: 96  Resp: 16  SpO2: 97%   Wt Readings from Last 3 Encounters:  06/04/21 239 lb 6 oz (108.6 kg)  01/31/21 262 lb 4 oz (119 kg)  01/28/21 262 lb (118.8 kg)   Body mass index is 45.23 kg/m.  Physical Exam Vitals and nursing note reviewed.  Constitutional:      General: She is not in acute distress.    Appearance: She is well-developed.  HENT:     Head: Normocephalic and atraumatic.      Mouth/Throat:     Mouth: Mucous membranes are moist.     Pharynx: Oropharynx is clear.  Eyes:     Conjunctiva/sclera: Conjunctivae normal.  Cardiovascular:     Rate and Rhythm: Normal rate and regular rhythm.     Pulses:          Dorsalis pedis pulses are 2+ on the right side and 2+ on the left side.     Heart sounds: No murmur heard. Pulmonary:     Effort: Pulmonary effort is normal. No respiratory distress.     Breath sounds: Normal breath  sounds.  Abdominal:     Palpations: Abdomen is soft. There is no hepatomegaly or mass.     Tenderness: There is no abdominal tenderness.  Lymphadenopathy:     Cervical: No cervical adenopathy.  Skin:    General: Skin is warm.     Findings: No erythema or rash.  Neurological:     General: No focal deficit present.     Mental Status: She is alert and oriented to person, place, and time.  Cranial Nerves: No cranial nerve deficit.     Gait: Gait normal.  Psychiatric:     Comments: Well groomed, good eye contact.   Diabetic Foot Exam - Simple   Simple Foot Form Diabetic Foot exam was performed with the following findings: Yes 06/04/2021 10:33 AM  Visual Inspection See comments: Yes Sensation Testing Intact to touch and monofilament testing bilaterally: Yes Pulse Check Posterior Tibialis and Dorsalis pulse intact bilaterally: Yes Comments Bonions and hammer toes.    ASSESSMENT AND PLAN:   Ms.Jimmy was seen today for follow-up.  Diagnoses and all orders for this visit: Orders Placed This Encounter  Procedures   Hepatic function panel   Basic metabolic panel   Ambulatory referral to Plastic Surgery   POC HgB A1c   Lab Results  Component Value Date   HGBA1C 5.8 06/04/2021   Dermoid cyst of forehead General surgeon prefers not to remove lesion because localization, so plastic surgery referral placed.  Type 2 diabetes mellitus with other specified complication (HCC) [id number] at goal. Continue Ozempic 0.5 mg weekly. Regular exercise and healthy diet with avoidance of added sugar food intake to continue.. Annual eye exam, periodic dental and foot care recommended. F/U in 5-6 months   Stage 3a chronic kidney disease Adequate hydration, low salt diet,avoidance of NSAID's,and adequate BP/glucose controlled recommended. BMP in 6 weeks.  Hyperlipidemia associated with type 2 diabetes mellitus ([place]) She will pick up Rx for Pravastatin 20 mg to take daily. Continue low fat  diet. LFT's in 6 weeks.  Essential hypertension, benign BP adequately controlled. Recommend monitoring BP at home. Continue Carvedilol 3.125 mg bid, Indapamide 1.25 mg daily,and low salt diet. Eye exam current.  Morbid obesity ([place]) S/P bariatric surgery in 08/2016. She lost about 23 Lb since her last visit. She understands the  benefits of wt loss as well as adverse effects of obesity. Consistency with healthy diet and physical activity encouraged. Continue Ozempic 0.5 mg weekly.   Alkaline phosphatase elevation Otherwise stable since 2018. Cholelithiasis , pending cholecystectomy. LFT's 6 weeks after statin med started.  Gastroesophageal reflux disease, unspecified whether esophagitis present Episodes she describes as "gasping for air" at night when lying down and after drinking water suggest acid reflux. She has no symptoms when she does not eat or drink fluid before bed time,so recommend GERD precautions. Avoid oral intake 3-4 hours before bedtime.  I spent a total of 43 minutes in both face to face and non face to face activities for this visit on the date of this encounter. During this time history was obtained and documented, examination was performed, prior labs/imaging reviewed, and assessment/plan discussed.  Return in about 6 months (around 12/02/2021) for Labs 6 weeks after starting Pravastatin.Marland Kitchen  Jaicion Laurie G. Martinique, MD  [facility]. [place] office.

## 2021-06-04 ENCOUNTER — Ambulatory Visit (INDEPENDENT_AMBULATORY_CARE_PROVIDER_SITE_OTHER): Payer: BC Managed Care – PPO | Admitting: Family Medicine

## 2021-06-04 ENCOUNTER — Encounter: Payer: Self-pay | Admitting: Family Medicine

## 2021-06-04 VITALS — BP 118/80 | HR 96 | Resp 16 | Ht 61.0 in | Wt 239.4 lb

## 2021-06-04 DIAGNOSIS — E1169 Type 2 diabetes mellitus with other specified complication: Secondary | ICD-10-CM

## 2021-06-04 DIAGNOSIS — N1831 Chronic kidney disease, stage 3a: Secondary | ICD-10-CM

## 2021-06-04 DIAGNOSIS — R748 Abnormal levels of other serum enzymes: Secondary | ICD-10-CM

## 2021-06-04 DIAGNOSIS — D2339 Other benign neoplasm of skin of other parts of face: Secondary | ICD-10-CM

## 2021-06-04 DIAGNOSIS — E785 Hyperlipidemia, unspecified: Secondary | ICD-10-CM

## 2021-06-04 DIAGNOSIS — I10 Essential (primary) hypertension: Secondary | ICD-10-CM | POA: Diagnosis not present

## 2021-06-04 DIAGNOSIS — K219 Gastro-esophageal reflux disease without esophagitis: Secondary | ICD-10-CM

## 2021-06-04 DIAGNOSIS — E1165 Type 2 diabetes mellitus with hyperglycemia: Secondary | ICD-10-CM

## 2021-06-04 LAB — POCT GLYCOSYLATED HEMOGLOBIN (HGB A1C): HbA1c, POC (prediabetic range): 5.8 % (ref 5.7–6.4)

## 2021-06-04 MED ORDER — PRAVASTATIN SODIUM 20 MG PO TABS: 20.0000 mg | ORAL_TABLET | Freq: Every day | ORAL | 3 refills | Status: DC

## 2021-06-04 MED ORDER — OZEMPIC (0.25 OR 0.5 MG/DOSE) 2 MG/1.5ML ~~LOC~~ SOPN
0.5000 mg | PEN_INJECTOR | SUBCUTANEOUS | 1 refills | Status: DC
  Filled 2021-06-04: qty 3, 56d supply, fill #0
  Filled 2021-06-16: qty 1.5, 28d supply, fill #0
  Filled 2021-07-17: qty 1.5, 28d supply, fill #1

## 2021-06-04 NOTE — Assessment & Plan Note (Addendum)
BP adequately controlled. Recommend monitoring BP at home. Continue Carvedilol 3.125 mg bid, Indapamide 1.25 mg daily,and low salt diet. Eye exam current.

## 2021-06-04 NOTE — Assessment & Plan Note (Signed)
HgA1C at goal. Continue Ozempic 0.5 mg weekly. Regular exercise and healthy diet with avoidance of added sugar food intake to continue.. Annual eye exam, periodic dental and foot care recommended. F/U in 5-6 months

## 2021-06-04 NOTE — Assessment & Plan Note (Signed)
She will pick up Rx for Pravastatin 20 mg to take daily. Continue low fat diet. LFT's in 6 weeks.

## 2021-06-04 NOTE — Patient Instructions (Addendum)
A few things to remember from today's visit:   Type 2 diabetes mellitus with hyperglycemia, without long-term current use of insulin ([place]) - Plan: POC HgB A1c  Essential hypertension, benign  Hyperlipidemia associated with type 2 diabetes mellitus ([place]) - Plan: pravastatin (PRAVACHOL) 20 MG tablet  Dermoid cyst of forehead - Plan: Ambulatory referral to Plastic Surgery  Abnormal LFTs  If you need refills please call your pharmacy. Do not use My Chart to request refills or for acute issues that need immediate attention.   Pick up Pravastatin.Re-check liver and kidney 6 weeks after. Appt with plastic surgeon will be arrange tor remove cyst. Continue Ozempic same dose, at some point we can discontinue and continue diet.  Please be sure medication list is accurate. If a new problem present, please set up appointment sooner than planned today.

## 2021-06-04 NOTE — Assessment & Plan Note (Signed)
S/P bariatric surgery in 08/2016. She lost about 23 Lb since her last visit. She understands the  benefits of wt loss as well as adverse effects of obesity. Consistency with healthy diet and physical activity encouraged. Continue Ozempic 0.5 mg weekly.

## 2021-06-04 NOTE — Assessment & Plan Note (Signed)
Otherwise stable since 2018. Cholelithiasis , pending cholecystectomy. LFT's 6 weeks after statin med started.

## 2021-06-04 NOTE — Assessment & Plan Note (Signed)
Adequate hydration, low salt diet,avoidance of NSAID's,and adequate BP/glucose controlled recommended. BMP in 6 weeks.

## 2021-06-05 ENCOUNTER — Other Ambulatory Visit (HOSPITAL_COMMUNITY): Payer: Self-pay

## 2021-06-06 ENCOUNTER — Other Ambulatory Visit: Payer: Self-pay | Admitting: Family Medicine

## 2021-06-06 ENCOUNTER — Other Ambulatory Visit (HOSPITAL_COMMUNITY): Payer: Self-pay

## 2021-06-06 DIAGNOSIS — E785 Hyperlipidemia, unspecified: Secondary | ICD-10-CM

## 2021-06-06 DIAGNOSIS — E1169 Type 2 diabetes mellitus with other specified complication: Secondary | ICD-10-CM

## 2021-06-06 MED ORDER — PRAVASTATIN SODIUM 20 MG PO TABS
20.0000 mg | ORAL_TABLET | Freq: Every day | ORAL | 3 refills | Status: DC
  Filled 2021-06-06 – 2021-06-16 (×2): qty 90, 90d supply, fill #0

## 2021-06-10 ENCOUNTER — Ambulatory Visit: Payer: BC Managed Care – PPO

## 2021-06-11 ENCOUNTER — Ambulatory Visit: Payer: BC Managed Care – PPO | Attending: Internal Medicine

## 2021-06-11 ENCOUNTER — Other Ambulatory Visit (HOSPITAL_COMMUNITY): Payer: Self-pay

## 2021-06-11 DIAGNOSIS — Z23 Encounter for immunization: Secondary | ICD-10-CM

## 2021-06-12 ENCOUNTER — Encounter: Payer: Self-pay | Admitting: Plastic Surgery

## 2021-06-12 ENCOUNTER — Other Ambulatory Visit (HOSPITAL_BASED_OUTPATIENT_CLINIC_OR_DEPARTMENT_OTHER): Payer: Self-pay

## 2021-06-12 ENCOUNTER — Ambulatory Visit (INDEPENDENT_AMBULATORY_CARE_PROVIDER_SITE_OTHER): Payer: BC Managed Care – PPO | Admitting: Plastic Surgery

## 2021-06-12 ENCOUNTER — Other Ambulatory Visit: Payer: Self-pay

## 2021-06-12 VITALS — BP 125/84 | HR 86 | Ht 61.0 in | Wt 236.4 lb

## 2021-06-12 DIAGNOSIS — H538 Other visual disturbances: Secondary | ICD-10-CM | POA: Diagnosis not present

## 2021-06-12 DIAGNOSIS — L723 Sebaceous cyst: Secondary | ICD-10-CM

## 2021-06-12 DIAGNOSIS — H43812 Vitreous degeneration, left eye: Secondary | ICD-10-CM | POA: Diagnosis not present

## 2021-06-12 MED ORDER — PFIZER COVID-19 VAC BIVALENT 30 MCG/0.3ML IM SUSP
INTRAMUSCULAR | 0 refills | Status: DC
  Filled 2021-06-12: qty 0.3, 1d supply, fill #0

## 2021-06-12 NOTE — Progress Notes (Signed)
? ?Referring Provider ?Martinique, Betty G, MD ?[facility] ?[place],  [place] [address]  ? ?CC:  ?Chief Complaint  ?Patient presents with  ? Consult  ?   ? ?[name] is an 56 y.o. female.  ?HPI: Patient presents to discuss lesion in her right upper forehead.  Is been there for over 20 years.  It is growing in size.  It is intermittently painful.  She was referred by her primary care provider for excision.  She is interested in having it removed. ? ?No Known Allergies ? ?Outpatient Encounter Medications as of 06/12/2021  ?Medication Sig  ? calcium carbonate (TUMS EX) 750 MG chewable tablet Chew 1,500 mg by mouth daily.  ? carvedilol (COREG) 3.125 MG tablet Take 1 tablet (3.125 mg total) by mouth 2 (two) times daily with a meal.  ? indapamide (LOZOL) 1.25 MG tablet Take 1 tablet (1.25 mg total) by mouth daily.  ? meclizine (ANTIVERT) 25 MG tablet Take 1 tablet by mouth 3 times daily as needed for dizziness.  ? Multiple Vitamin (MULTIVITAMIN) tablet Take 1 tablet by mouth daily.  ? pravastatin (PRAVACHOL) 20 MG tablet Take 1 tablet (20 mg total) by mouth daily.  ? Semaglutide,0.25 or 0.5MG /DOS, (OZEMPIC, 0.25 OR 0.5 MG/DOSE,) 2 MG/1.5ML SOPN Inject 0.5 mg into the skin once a week. Start with 0.25 mg weekly and double dose every 2-3 weeks as tolerated to a goal of 1 mg weekly.  ? tiZANidine (ZANAFLEX) 2 MG tablet Take 1 tablet (2 mg total) by mouth every 8 (eight) hours as needed for muscle spasms.  ? COVID-19 mRNA bivalent vaccine, Pfizer, (PFIZER COVID-19 VAC BIVALENT) injection Inject into the muscle.  ? ?No facility-administered encounter medications on file as of 06/12/2021.  ?  ? ?Past Medical History:  ?Diagnosis Date  ? Anemia   ? Back pain   ? Clotting disorder ([place])   ? Diabetes mellitus without complication ([place])   ? Edema 04/25/2015  ? Headache(784.0)   ? Herniated disc   ? History of colonic polyps   ? Hypertension   ? Joint pain   ? Obesity   ? Osteoarthritis   ? Shortness of breath 04/25/2015  ?  TIA (transient ischemic attack) 2007  ? hx of  ? Vasculitis ([place])   ? L leg - Polyarthritis  ? Venous insufficiency   ? Vitamin D deficiency   ? ? ?Past Surgical History:  ?Procedure Laterality Date  ? ABDOMINAL HYSTERECTOMY    ? LAPAROSCOPIC GASTRIC SLEEVE RESECTION N/A 08/11/2016  ? Procedure: LAPAROSCOPIC GASTRIC SLEEVE RESECTION, UPPER ENDOSCOPY;  Surgeon: Arta Bruce Kinsinger, MD;  Location: [facility];  Service: General;  Laterality: N/A;  ? ? ?Family History  ?Problem Relation Age of Onset  ? Stroke Mother   ? Hypertension Mother   ? AAA (abdominal aortic aneurysm) Mother   ? Hypertension Father   ? Cancer Father   ?     Prostate  ? Arthritis Other   ? Hypertension Other   ? Stroke Other   ? Colon cancer Neg Hx   ? Esophageal cancer Neg Hx   ? Rectal cancer Neg Hx   ? Stomach cancer Neg Hx   ? ? ?Social History  ? ?Social History Narrative  ? Not on file  ?  ? ?Review of Systems ?General: Denies fevers, chills, weight loss ?CV: Denies chest pain, shortness of breath, palpitations ? ?Physical Exam ?Vitals with BMI 06/12/2021 06/04/2021 01/31/2021  ?Height 5\' 1"  5\' 1"  5\' 1"   ?  Weight 236 lbs 6 oz 239 lbs 6 oz 262 lbs 4 oz  ?BMI 44.69 45.25 49.58  ?Systolic 338 250 539  ?Diastolic 84 80 80  ?Pulse 86 96 65  ?  ?General:  No acute distress,  Alert and oriented, Non-Toxic, Normal speech and affect ?Examination shows a mobile cystic mass in the right upper forehead near the hairline.  No overlying skin changes.  Feels like a cyst. ? ?Assessment/Plan ?Patient presents with what feels like a cystic lesion in the right upper forehead.  We discussed excision under local in the office.  We discussed risks include bleeding, infection, damage to surrounding structures need for additional procedures.  All of her questions were answered and she is interested in moving forward. ? ?Cindra Presume ?06/12/2021, 1:53 PM  ? ? ?  ?

## 2021-06-12 NOTE — Progress Notes (Signed)
? ?  Covid-19 Vaccination Clinic ? ?Name:  [name]    ?MRN: [id number] ?DOB: 1965/07/21 ? ?06/12/2021 ? ?[name] was observed post Covid-19 immunization for 15 minutes without incident. She was provided with Vaccine Information Sheet and instruction to access the V-Safe system.  ? ?[name] was instructed to call 911 with any severe reactions post vaccine: ?Difficulty breathing  ?Swelling of face and throat  ?A fast heartbeat  ?A bad rash all over body  ?Dizziness and weakness  ? ?Immunizations Administered   ? ? Name Date Dose VIS Date Route  ? Ambulance person Booster 06/11/2021  3:30 PM 0.3 mL 12/11/2020 Intramuscular  ? Manufacturer: [place]: [phone]  ? [place]: [phone]  ? ?  ? ? ?

## 2021-06-13 ENCOUNTER — Other Ambulatory Visit (HOSPITAL_COMMUNITY): Payer: Self-pay

## 2021-06-16 ENCOUNTER — Other Ambulatory Visit (HOSPITAL_COMMUNITY): Payer: Self-pay

## 2021-06-17 DIAGNOSIS — H43813 Vitreous degeneration, bilateral: Secondary | ICD-10-CM | POA: Diagnosis not present

## 2021-06-17 DIAGNOSIS — H2513 Age-related nuclear cataract, bilateral: Secondary | ICD-10-CM | POA: Diagnosis not present

## 2021-06-17 DIAGNOSIS — H43823 Vitreomacular adhesion, bilateral: Secondary | ICD-10-CM | POA: Diagnosis not present

## 2021-06-19 ENCOUNTER — Ambulatory Visit (INDEPENDENT_AMBULATORY_CARE_PROVIDER_SITE_OTHER): Payer: BC Managed Care – PPO | Admitting: Plastic Surgery

## 2021-06-19 ENCOUNTER — Other Ambulatory Visit (HOSPITAL_COMMUNITY)
Admission: RE | Admit: 2021-06-19 | Discharge: 2021-06-19 | Disposition: A | Discharge status: Home / Self Care | Payer: BC Managed Care – PPO | Source: Ambulatory Visit | Attending: Plastic Surgery | Admitting: Plastic Surgery

## 2021-06-19 ENCOUNTER — Other Ambulatory Visit (HOSPITAL_COMMUNITY): Payer: Self-pay

## 2021-06-19 ENCOUNTER — Other Ambulatory Visit: Payer: Self-pay

## 2021-06-19 ENCOUNTER — Encounter: Payer: Self-pay | Admitting: Plastic Surgery

## 2021-06-19 VITALS — BP 129/81 | HR 69 | Ht 61.0 in | Wt 239.2 lb

## 2021-06-19 DIAGNOSIS — L723 Sebaceous cyst: Secondary | ICD-10-CM | POA: Diagnosis not present

## 2021-06-19 DIAGNOSIS — L7212 Trichodermal cyst: Secondary | ICD-10-CM | POA: Diagnosis not present

## 2021-06-19 DIAGNOSIS — D492 Neoplasm of unspecified behavior of bone, soft tissue, and skin: Secondary | ICD-10-CM | POA: Diagnosis not present

## 2021-06-19 NOTE — Progress Notes (Signed)
Operative Note  ? ?DATE OF OPERATION: 06/19/2021 ? ?LOCATION:   ? ?SURGICAL DEPARTMENT: Plastic Surgery ? ?PREOPERATIVE DIAGNOSES:  Right forehead cyst ? ?POSTOPERATIVE DIAGNOSES:  same ? ?PROCEDURE:  ?Excision of right forehead cyst measuring 2.5 cm ?Complex closure measuring 2.5 cm ? ?SURGEON: Talmadge Coventry, MD ? ?ANESTHESIA:  Local ? ?COMPLICATIONS: None.  ? ?INDICATIONS FOR PROCEDURE:  ?The patient, [name] is a 56 y.o. female born on 1966-04-01, is here for treatment of Right forehead cyst ?MRN: [id number] ? ?CONSENT:  ?Informed consent was obtained directly from the patient. Risks, benefits and alternatives were fully discussed. Specific risks including but not limited to bleeding, infection, hematoma, seroma, scarring, pain, infection, wound healing problems, and need for further surgery were all discussed. The patient did have an ample opportunity to have questions answered to satisfaction.  ? ?DESCRIPTION OF PROCEDURE:  ?Local anesthesia was administered. The patient's operative site was prepped and draped in a sterile fashion. A time out was performed and all information was confirmed to be correct.  The lesion was excised with a 15 blade.  Hemostasis was obtained.  Circumferential undermining was performed and the skin was advanced and closed in layers with interrupted buried Monocryl sutures and 5-0 fast gut for the skin.  The lesion excised measured 2.5 cm, and the total length of closure measured 2.5 cm.   ? ?The patient tolerated the procedure well.  There were no complications. ?  ? ?

## 2021-06-24 ENCOUNTER — Encounter: Payer: Self-pay | Admitting: Family Medicine

## 2021-06-24 LAB — SURGICAL PATHOLOGY

## 2021-06-26 ENCOUNTER — Other Ambulatory Visit: Payer: Self-pay

## 2021-06-26 ENCOUNTER — Ambulatory Visit (INDEPENDENT_AMBULATORY_CARE_PROVIDER_SITE_OTHER): Payer: BC Managed Care – PPO | Admitting: Plastic Surgery

## 2021-06-26 DIAGNOSIS — L723 Sebaceous cyst: Secondary | ICD-10-CM

## 2021-06-26 NOTE — Progress Notes (Signed)
I had a phone conversation with the patient after her biopsy results came back.  After discussion with the pathologist this was a proliferating pilar cyst and my understanding of the recommendations is a slightly wider excision.  There was no atypical cells or malignancy found in the specimen.  I discussed this with Abe People today and we will plan to change her next visit to a procedure visit to do a excision of the margin around her scar.  She is fully understanding and interested in moving forward.  All of her questions were answered. ?

## 2021-07-02 ENCOUNTER — Other Ambulatory Visit (HOSPITAL_COMMUNITY): Payer: Self-pay

## 2021-07-03 ENCOUNTER — Encounter: Payer: Self-pay | Admitting: Plastic Surgery

## 2021-07-03 ENCOUNTER — Other Ambulatory Visit: Payer: Self-pay

## 2021-07-03 ENCOUNTER — Ambulatory Visit (INDEPENDENT_AMBULATORY_CARE_PROVIDER_SITE_OTHER): Payer: BC Managed Care – PPO | Admitting: Plastic Surgery

## 2021-07-03 ENCOUNTER — Other Ambulatory Visit (HOSPITAL_COMMUNITY)
Admission: RE | Admit: 2021-07-03 | Discharge: 2021-07-03 | Disposition: A | Discharge status: Home / Self Care | Payer: BC Managed Care – PPO | Source: Ambulatory Visit | Attending: Plastic Surgery | Admitting: Plastic Surgery

## 2021-07-03 VITALS — BP 121/81 | HR 72 | Ht 61.0 in | Wt 230.0 lb

## 2021-07-03 DIAGNOSIS — L089 Local infection of the skin and subcutaneous tissue, unspecified: Secondary | ICD-10-CM | POA: Diagnosis not present

## 2021-07-03 DIAGNOSIS — L7212 Trichodermal cyst: Secondary | ICD-10-CM

## 2021-07-03 DIAGNOSIS — L905 Scar conditions and fibrosis of skin: Secondary | ICD-10-CM | POA: Diagnosis not present

## 2021-07-03 DIAGNOSIS — L723 Sebaceous cyst: Secondary | ICD-10-CM

## 2021-07-03 NOTE — Progress Notes (Signed)
Operative Note  ? ?DATE OF OPERATION: 07/03/2021 ? ?LOCATION:   ? ?SURGICAL DEPARTMENT: Plastic Surgery ? ?PREOPERATIVE DIAGNOSES: Right forehead proliferating cyst ? ?POSTOPERATIVE DIAGNOSES:  same ? ?PROCEDURE:  ?Excision of right forehead proliferating cyst margin measuring 3 cm ?Complex closure measuring 3 cm ? ?SURGEON: Talmadge Coventry, MD ? ?ANESTHESIA:  Local ? ?COMPLICATIONS: None.  ? ?INDICATIONS FOR PROCEDURE:  ?The patient, [name] is a 56 y.o. female born on [birth year], is here for treatment of right forehead cyst ?MRN: [id number] ? ?CONSENT:  ?Informed consent was obtained directly from the patient. Risks, benefits and alternatives were fully discussed. Specific risks including but not limited to bleeding, infection, hematoma, seroma, scarring, pain, infection, wound healing problems, and need for further surgery were all discussed. The patient did have an ample opportunity to have questions answered to satisfaction.  ? ?DESCRIPTION OF PROCEDURE:  ?Local anesthesia was administered. The patient's operative site was prepped and draped in a sterile fashion. A time out was performed and all information was confirmed to be correct.  The lesion was excised with a 15 blade.  Hemostasis was obtained.  Circumferential undermining was performed and the skin was advanced and closed in layers with interrupted buried Monocryl sutures and 4-0 Monocryl for the skin.  The lesion excised measured 3 cm, and the total length of closure measured 3 cm.   ? ?The patient tolerated the procedure well.  There were no complications. ?  ? ?

## 2021-07-04 DIAGNOSIS — Z01419 Encounter for gynecological examination (general) (routine) without abnormal findings: Secondary | ICD-10-CM | POA: Diagnosis not present

## 2021-07-04 DIAGNOSIS — Z1231 Encounter for screening mammogram for malignant neoplasm of breast: Secondary | ICD-10-CM | POA: Diagnosis not present

## 2021-07-04 DIAGNOSIS — Z6841 Body Mass Index (BMI) 40.0 and over, adult: Secondary | ICD-10-CM | POA: Diagnosis not present

## 2021-07-08 LAB — SURGICAL PATHOLOGY

## 2021-07-10 ENCOUNTER — Other Ambulatory Visit (HOSPITAL_COMMUNITY): Payer: Self-pay

## 2021-07-16 ENCOUNTER — Other Ambulatory Visit (INDEPENDENT_AMBULATORY_CARE_PROVIDER_SITE_OTHER): Payer: BC Managed Care – PPO

## 2021-07-16 DIAGNOSIS — R748 Abnormal levels of other serum enzymes: Secondary | ICD-10-CM

## 2021-07-16 DIAGNOSIS — I10 Essential (primary) hypertension: Secondary | ICD-10-CM | POA: Diagnosis not present

## 2021-07-16 DIAGNOSIS — N1831 Chronic kidney disease, stage 3a: Secondary | ICD-10-CM | POA: Diagnosis not present

## 2021-07-16 LAB — HEPATIC FUNCTION PANEL
ALT: 16 U/L (ref 0–35)
AST: 22 U/L (ref 0–37)
Albumin: 4.2 g/dL (ref 3.5–5.2)
Alkaline Phosphatase: 256 U/L — ABNORMAL HIGH (ref 39–117)
Bilirubin, Direct: 0.1 mg/dL (ref 0.0–0.3)
Total Bilirubin: 0.5 mg/dL (ref 0.2–1.2)
Total Protein: 7.5 g/dL (ref 6.0–8.3)

## 2021-07-16 LAB — BASIC METABOLIC PANEL
BUN: 19 mg/dL (ref 6–23)
CO2: 30 mEq/L (ref 19–32)
Calcium: 9.9 mg/dL (ref 8.4–10.5)
Chloride: 100 mEq/L (ref 96–112)
Creatinine, Ser: 1.15 mg/dL (ref 0.40–1.20)
GFR: 54.04 mL/min — ABNORMAL LOW (ref >60.00)
Glucose, Bld: 86 mg/dL (ref 70–99)
Potassium: 3.5 mEq/L (ref 3.5–5.1)
Sodium: 137 mEq/L (ref 135–145)

## 2021-07-17 ENCOUNTER — Encounter: Payer: Self-pay | Admitting: Family Medicine

## 2021-07-17 ENCOUNTER — Encounter: Payer: Self-pay | Admitting: Plastic Surgery

## 2021-07-17 ENCOUNTER — Other Ambulatory Visit: Payer: Self-pay | Admitting: Family Medicine

## 2021-07-17 ENCOUNTER — Ambulatory Visit (INDEPENDENT_AMBULATORY_CARE_PROVIDER_SITE_OTHER): Payer: BC Managed Care – PPO | Admitting: Plastic Surgery

## 2021-07-17 ENCOUNTER — Other Ambulatory Visit (HOSPITAL_COMMUNITY): Payer: Self-pay

## 2021-07-17 DIAGNOSIS — E1165 Type 2 diabetes mellitus with hyperglycemia: Secondary | ICD-10-CM

## 2021-07-17 DIAGNOSIS — L7212 Trichodermal cyst: Secondary | ICD-10-CM

## 2021-07-17 MED ORDER — OZEMPIC (0.25 OR 0.5 MG/DOSE) 2 MG/3ML ~~LOC~~ SOPN
PEN_INJECTOR | SUBCUTANEOUS | 0 refills | Status: DC
  Filled 2021-07-17: qty 3, 28d supply, fill #0

## 2021-07-17 NOTE — Telephone Encounter (Signed)
Please see message. °

## 2021-07-17 NOTE — Progress Notes (Signed)
Patient presents postop from excision of a proliferating pilar cyst in the right forehead.  I did a reexcision of this for margin control.  Pathology does show focal involvement of the deep margin with lateral margins free.  Her current incision is healing fine with Monocryl sutures in place.  Current plan is to reexcise to clear the deep margin.  All of her questions were answered. ?

## 2021-07-23 ENCOUNTER — Telehealth: Payer: Self-pay | Admitting: Plastic Surgery

## 2021-07-23 NOTE — Telephone Encounter (Signed)
Pt is calling in wanting to ask the question as to why she will have to do an additional pathology test.  She would like to know exactly what the findings is and why the additional test need to be done.  Pt would like to speak with the pathologist that resulted the test to get clarification on what they are looking for.  If she is not able to speak with the pathologist she would like to have Dr. Claudia Desanctis give her a call to explain the reason of the additional test.  Pt would like to have a call back. ?

## 2021-07-23 NOTE — Telephone Encounter (Signed)
Spoke with patient and explained report a little better. Informed that there were still traces of the tumor showing and that is the reason for the re-excision; they want to make sure there is only cleared tissue showing. She understood. ?

## 2021-07-28 ENCOUNTER — Telehealth: Payer: Self-pay

## 2021-07-28 NOTE — Telephone Encounter (Signed)
Patient called to ask Dr. Claudia Desanctis if he thinks this will be the final incision.  She wants to be sure he will be able to remove everything he needs to take out this time.  Please call. ?

## 2021-07-31 NOTE — Telephone Encounter (Signed)
Conveyed message to pt. She understood but also asked what does it mean if Dr. Claudia Desanctis has to go outside of the perimeter, how will he know he has gotten it all out, what are the risks if it grows back? I adv pt that I would send her questions to Dr. Claudia Desanctis and would give her a call back once I received a response. Pt stated that this will be the 3rd time that she will have had the area excised and she doesn't think she wants to excise it again after this time if anything else pops up.  ?

## 2021-08-01 NOTE — Telephone Encounter (Signed)
Called and spoke with the patient and informed her of the response from Dr. Claudia Desanctis regarding her message.   Patient verbalized understanding and agreed.  She stated that she will speak her husband and give [place] a call back to schedule the surgery.//AB/CMA ?

## 2021-08-01 NOTE — Telephone Encounter (Signed)
Won't know for sure if it's all out until we get the pathology.  If it's all out chances of recurrence are very low.

## 2021-08-12 ENCOUNTER — Telehealth: Payer: Self-pay | Admitting: Plastic Surgery

## 2021-08-12 NOTE — Telephone Encounter (Signed)
Patient called to let provider know she's ready to schedule her next surgery on dermoid cyst of forehead. Spoke with Colletta [place]; she needs orders from provider. Please advise at [phone]. ?

## 2021-08-13 NOTE — Telephone Encounter (Signed)
Appointment scheduled for 09/11/21. Thank you! ?

## 2021-08-13 NOTE — Telephone Encounter (Signed)
This is a local procedure in the office.

## 2021-08-14 ENCOUNTER — Other Ambulatory Visit (HOSPITAL_COMMUNITY): Payer: Self-pay

## 2021-08-14 ENCOUNTER — Telehealth: Payer: Self-pay | Admitting: Family Medicine

## 2021-08-14 NOTE — Telephone Encounter (Signed)
Pt requesting a refill of  Semaglutide,0.25 or 0.'5MG'$ /DOS, (OZEMPIC, 0.25 OR 0.5 MG/DOSE,) 2 MG/3ML SOPN  and would like to increase the dosage.   ?[facility] Phone:  [phone]  ?Fax:  [phone]  ?  ? ?

## 2021-08-15 ENCOUNTER — Other Ambulatory Visit (HOSPITAL_COMMUNITY): Payer: Self-pay

## 2021-08-15 MED ORDER — SEMAGLUTIDE (1 MG/DOSE) 4 MG/3ML ~~LOC~~ SOPN
1.0000 mg | PEN_INJECTOR | SUBCUTANEOUS | 2 refills | Status: DC
  Filled 2021-08-15: qty 3, 28d supply, fill #0
  Filled 2021-10-10: qty 3, 28d supply, fill #1
  Filled 2021-11-06 – 2021-11-07 (×2): qty 3, 28d supply, fill #2

## 2021-08-15 NOTE — Telephone Encounter (Signed)
Rx sent in

## 2021-08-15 NOTE — Telephone Encounter (Signed)
It is ok to increase Ozempic dose from 0.5 mg weekly to 1 mg weekly. ?Thanks, ?BJ ?

## 2021-09-11 ENCOUNTER — Other Ambulatory Visit (HOSPITAL_COMMUNITY)
Admission: RE | Admit: 2021-09-11 | Discharge: 2021-09-11 | Disposition: A | Discharge status: Home / Self Care | Payer: BC Managed Care – PPO | Source: Ambulatory Visit | Attending: Plastic Surgery | Admitting: Plastic Surgery

## 2021-09-11 ENCOUNTER — Encounter: Payer: Self-pay | Admitting: Plastic Surgery

## 2021-09-11 ENCOUNTER — Ambulatory Visit (INDEPENDENT_AMBULATORY_CARE_PROVIDER_SITE_OTHER): Payer: BC Managed Care – PPO | Admitting: Plastic Surgery

## 2021-09-11 VITALS — BP 122/82 | HR 68

## 2021-09-11 DIAGNOSIS — L7212 Trichodermal cyst: Secondary | ICD-10-CM

## 2021-09-11 NOTE — Progress Notes (Signed)
Operative Note   DATE OF OPERATION: 09/11/2021  LOCATION:    SURGICAL DEPARTMENT: Plastic Surgery  PREOPERATIVE DIAGNOSES: Right forehead proliferating cyst  POSTOPERATIVE DIAGNOSES:  same  PROCEDURE:  Excision of right forehead proliferating cyst measuring 3.5 cm Complex closure measuring 3.5 cm  SURGEON: Talmadge Coventry, MD  ANESTHESIA:  Local  COMPLICATIONS: None.   INDICATIONS FOR PROCEDURE:  The patient, [name] is a 56 y.o. female born on 12/05/1965, is here for treatment of right forehead proliferating cyst with positive deep margin on prior excision. MRN: [id number]  CONSENT:  Informed consent was obtained directly from the patient. Risks, benefits and alternatives were fully discussed. Specific risks including but not limited to bleeding, infection, hematoma, seroma, scarring, pain, infection, wound healing problems, and need for further surgery were all discussed. The patient did have an ample opportunity to have questions answered to satisfaction.   DESCRIPTION OF PROCEDURE:  Local anesthesia was administered. The patient's operative site was prepped and draped in a sterile fashion. A time out was performed and all information was confirmed to be correct.  I marked out around the borders of her previous scar.  The lesion was excised with a 15 blade.  I took all tissue down to periosteum.  Hemostasis was obtained.  Circumferential undermining was performed and the skin was advanced and closed in layers with interrupted buried Monocryl sutures and 4-0 Monocryl for the skin.  The lesion excised measured 3.5 cm, and the total length of closure measured 3.5 cm.    The patient tolerated the procedure well.  There were no complications.

## 2021-09-15 LAB — SURGICAL PATHOLOGY

## 2021-09-25 ENCOUNTER — Ambulatory Visit (INDEPENDENT_AMBULATORY_CARE_PROVIDER_SITE_OTHER): Payer: BC Managed Care – PPO | Admitting: Surgical

## 2021-09-25 ENCOUNTER — Encounter: Payer: Self-pay | Admitting: Surgical

## 2021-09-25 DIAGNOSIS — L7212 Trichodermal cyst: Secondary | ICD-10-CM

## 2021-09-25 NOTE — Progress Notes (Signed)
56 year old female here for follow-up after reexcision of forehead proliferating cyst with Dr. Claudia Desanctis on 09/11/2021.  She is 2 weeks postop.  She reports she is doing well.  Has some tenderness in the right parietal scalp.  She is otherwise doing well.  On exam right forehead incision is healing well, 4 Monocryl suture knots are noted.  There is no erythema or cellulitic changes noted.   We were started with removing the peripheral sutures first, incision is intact and well-healed.  3 of the suture knots were removed.  One of the central sutures was left in place, however the suture knot was clipped to be much shorter.  Discussed with patient this will dissolve over the next few weeks.  Was decided to leave the suture in place due to the area and tension on the skin.  Patient was in agreement with this.  We discussed protection from sun with sunscreen starting in 1 to 2 weeks.

## 2021-10-09 ENCOUNTER — Emergency Department (HOSPITAL_COMMUNITY): Payer: BC Managed Care – PPO

## 2021-10-09 ENCOUNTER — Encounter (HOSPITAL_COMMUNITY): Payer: Self-pay

## 2021-10-09 ENCOUNTER — Other Ambulatory Visit: Payer: Self-pay

## 2021-10-09 ENCOUNTER — Emergency Department (HOSPITAL_COMMUNITY)
Admission: EM | Admit: 2021-10-09 | Discharge: 2021-10-10 | Disposition: A | Discharge status: Home / Self Care | Payer: BC Managed Care – PPO | Attending: Emergency Medicine | Admitting: Emergency Medicine

## 2021-10-09 DIAGNOSIS — I10 Essential (primary) hypertension: Secondary | ICD-10-CM | POA: Diagnosis not present

## 2021-10-09 DIAGNOSIS — M47814 Spondylosis without myelopathy or radiculopathy, thoracic region: Secondary | ICD-10-CM | POA: Diagnosis not present

## 2021-10-09 DIAGNOSIS — M2578 Osteophyte, vertebrae: Secondary | ICD-10-CM | POA: Diagnosis not present

## 2021-10-09 DIAGNOSIS — S82121A Displaced fracture of lateral condyle of right tibia, initial encounter for closed fracture: Secondary | ICD-10-CM | POA: Diagnosis not present

## 2021-10-09 DIAGNOSIS — M546 Pain in thoracic spine: Secondary | ICD-10-CM | POA: Insufficient documentation

## 2021-10-09 DIAGNOSIS — M25512 Pain in left shoulder: Secondary | ICD-10-CM | POA: Diagnosis not present

## 2021-10-09 DIAGNOSIS — M542 Cervicalgia: Secondary | ICD-10-CM | POA: Insufficient documentation

## 2021-10-09 DIAGNOSIS — M25561 Pain in right knee: Secondary | ICD-10-CM | POA: Insufficient documentation

## 2021-10-09 DIAGNOSIS — E119 Type 2 diabetes mellitus without complications: Secondary | ICD-10-CM | POA: Insufficient documentation

## 2021-10-09 DIAGNOSIS — M545 Low back pain, unspecified: Secondary | ICD-10-CM | POA: Diagnosis not present

## 2021-10-09 DIAGNOSIS — Y92410 Unspecified street and highway as the place of occurrence of the external cause: Secondary | ICD-10-CM | POA: Insufficient documentation

## 2021-10-09 DIAGNOSIS — S82141A Displaced bicondylar fracture of right tibia, initial encounter for closed fracture: Secondary | ICD-10-CM

## 2021-10-09 DIAGNOSIS — M47812 Spondylosis without myelopathy or radiculopathy, cervical region: Secondary | ICD-10-CM | POA: Diagnosis not present

## 2021-10-09 DIAGNOSIS — M7989 Other specified soft tissue disorders: Secondary | ICD-10-CM | POA: Diagnosis not present

## 2021-10-09 DIAGNOSIS — R457 State of emotional shock and stress, unspecified: Secondary | ICD-10-CM | POA: Diagnosis not present

## 2021-10-09 DIAGNOSIS — M1711 Unilateral primary osteoarthritis, right knee: Secondary | ICD-10-CM | POA: Diagnosis not present

## 2021-10-09 MED ORDER — KETOROLAC TROMETHAMINE 30 MG/ML IJ SOLN
30.0000 mg | Freq: Once | INTRAMUSCULAR | Status: AC
  Administered 2021-10-09: 30 mg via INTRAVENOUS
  Filled 2021-10-09: qty 1

## 2021-10-09 MED ORDER — KETOROLAC TROMETHAMINE 60 MG/2ML IM SOLN: 60.0000 mg | Freq: Once | INTRAMUSCULAR | Status: DC

## 2021-10-09 MED ORDER — CYCLOBENZAPRINE HCL 10 MG PO TABS
10.0000 mg | ORAL_TABLET | Freq: Once | ORAL | Status: AC
  Administered 2021-10-09: 10 mg via ORAL
  Filled 2021-10-09: qty 1
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
## 2021-10-10 ENCOUNTER — Other Ambulatory Visit (HOSPITAL_COMMUNITY): Payer: Self-pay

## 2021-10-10 ENCOUNTER — Other Ambulatory Visit: Payer: Self-pay

## 2021-10-10 ENCOUNTER — Emergency Department (HOSPITAL_COMMUNITY): Payer: BC Managed Care – PPO

## 2021-10-10 DIAGNOSIS — M25561 Pain in right knee: Secondary | ICD-10-CM | POA: Diagnosis not present

## 2021-10-10 DIAGNOSIS — S82121A Displaced fracture of lateral condyle of right tibia, initial encounter for closed fracture: Secondary | ICD-10-CM | POA: Diagnosis not present

## 2021-10-10 DIAGNOSIS — S82141A Displaced bicondylar fracture of right tibia, initial encounter for closed fracture: Secondary | ICD-10-CM | POA: Diagnosis not present

## 2021-10-10 MED ORDER — OXYCODONE HCL 5 MG PO TABS
2.5000 mg | ORAL_TABLET | Freq: Four times a day (QID) | ORAL | 0 refills | Status: DC | PRN
  Filled 2021-10-10: qty 20, 5d supply, fill #0

## 2021-10-10 MED ORDER — OXYCODONE HCL 5 MG PO TABS
5.0000 mg | ORAL_TABLET | Freq: Once | ORAL | Status: AC
  Administered 2021-10-10: 5 mg via ORAL
  Filled 2021-10-10: qty 1

## 2021-10-10 MED ORDER — ACETAMINOPHEN 500 MG PO TABS
1000.0000 mg | ORAL_TABLET | Freq: Once | ORAL | Status: AC
  Administered 2021-10-10: 1000 mg via ORAL
  Filled 2021-10-10: qty 2

## 2021-10-10 NOTE — Discharge Instructions (Addendum)
For pain control you may take at 1000 mg of Tylenol every 8 hours scheduled.  In addition you can take 0.5 to 1 tablet of Oxycodone every 6 hours as needed for pain not controlled with the scheduled Tylenol. ? ?
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
## 2021-10-13 ENCOUNTER — Encounter: Payer: Self-pay | Admitting: Family Medicine

## 2021-10-13 ENCOUNTER — Encounter (HOSPITAL_COMMUNITY): Payer: Self-pay | Admitting: Orthopaedic Surgery

## 2021-10-13 ENCOUNTER — Ambulatory Visit: Payer: BC Managed Care – PPO | Admitting: Physician Assistant

## 2021-10-13 ENCOUNTER — Other Ambulatory Visit: Payer: Self-pay

## 2021-10-13 ENCOUNTER — Ambulatory Visit (INDEPENDENT_AMBULATORY_CARE_PROVIDER_SITE_OTHER): Payer: BC Managed Care – PPO | Admitting: Family Medicine

## 2021-10-13 VITALS — BP 128/80 | HR 75 | Resp 16 | Ht 61.0 in

## 2021-10-13 DIAGNOSIS — S20412D Abrasion of left back wall of thorax, subsequent encounter: Secondary | ICD-10-CM

## 2021-10-13 DIAGNOSIS — S82141D Displaced bicondylar fracture of right tibia, subsequent encounter for closed fracture with routine healing: Secondary | ICD-10-CM | POA: Diagnosis not present

## 2021-10-13 DIAGNOSIS — T148XXA Other injury of unspecified body region, initial encounter: Secondary | ICD-10-CM

## 2021-10-13 DIAGNOSIS — M7918 Myalgia, other site: Secondary | ICD-10-CM | POA: Diagnosis not present

## 2021-10-13 NOTE — Progress Notes (Addendum)
Mrs [name] denies chest pain or shortness of breath.  Patient denies having any s/s of Covid in her household.  Patient denies any known exposure to Covid.   Mrs [name]' PCP is Dr. Betty Martinique, patient had  an appointment with Dr. Martinique today, labs were not drawn.  [name] has type II diabetes, patient reports that A1C's have been lower than 6, so he has not been checking CBGs'.  I asked patient to check CBG after awaking and every 2 hours until arrival  to the hospital.  I Instructed patient if CBG is less than 70 to take 4 Glucose Tablets or 1 tube of Glucose Gel or 1/2 cup of a clear juice. Recheck CBG in 15 minutes if CBG is not over 70 call, pre- op desk at [phone] for further instructions.   `[name]' surgical case is lifted for left knee, it is the right knee, I left a message on Sherri's voice mail asking if it could be changed.

## 2021-10-13 NOTE — Patient Instructions (Signed)
A few things to remember from today's visit:  Closed fracture of right tibial plateau with routine healing, subsequent encounter  Musculoskeletal pain  If you need refills please call your pharmacy. Do not use My Chart to request refills or for acute issues that need immediate attention.   You can remove bandage from back excoriation. Continue following with orthopedist.  Please be sure medication list is accurate. If a new problem present, please set up appointment sooner than planned today.

## 2021-10-15 ENCOUNTER — Other Ambulatory Visit: Payer: Self-pay

## 2021-10-15 ENCOUNTER — Observation Stay (HOSPITAL_COMMUNITY)
Admission: AD | Admit: 2021-10-15 | Discharge: 2021-10-16 | Disposition: A | Discharge status: Home Health | Payer: BC Managed Care – PPO | Attending: Orthopaedic Surgery | Admitting: Orthopaedic Surgery

## 2021-10-15 ENCOUNTER — Ambulatory Visit: Payer: BC Managed Care – PPO | Admitting: Orthopaedic Surgery

## 2021-10-15 ENCOUNTER — Encounter (HOSPITAL_COMMUNITY)
Admission: AD | Disposition: A | Discharge status: Home Health | Payer: Self-pay | Source: Home / Self Care | Attending: Orthopaedic Surgery

## 2021-10-15 ENCOUNTER — Encounter (HOSPITAL_COMMUNITY): Payer: Self-pay | Admitting: Orthopaedic Surgery

## 2021-10-15 ENCOUNTER — Ambulatory Visit (HOSPITAL_COMMUNITY): Payer: BC Managed Care – PPO

## 2021-10-15 ENCOUNTER — Ambulatory Visit (HOSPITAL_COMMUNITY): Payer: BC Managed Care – PPO | Admitting: Certified Registered"

## 2021-10-15 ENCOUNTER — Other Ambulatory Visit (HOSPITAL_COMMUNITY): Payer: Self-pay | Admitting: Orthopaedic Surgery

## 2021-10-15 DIAGNOSIS — S82141A Displaced bicondylar fracture of right tibia, initial encounter for closed fracture: Secondary | ICD-10-CM | POA: Diagnosis not present

## 2021-10-15 DIAGNOSIS — E119 Type 2 diabetes mellitus without complications: Secondary | ICD-10-CM | POA: Diagnosis not present

## 2021-10-15 DIAGNOSIS — Z8673 Personal history of transient ischemic attack (TIA), and cerebral infarction without residual deficits: Secondary | ICD-10-CM | POA: Insufficient documentation

## 2021-10-15 DIAGNOSIS — S82121A Displaced fracture of lateral condyle of right tibia, initial encounter for closed fracture: Secondary | ICD-10-CM | POA: Diagnosis not present

## 2021-10-15 DIAGNOSIS — I10 Essential (primary) hypertension: Secondary | ICD-10-CM | POA: Diagnosis not present

## 2021-10-15 DIAGNOSIS — Z01818 Encounter for other preprocedural examination: Principal | ICD-10-CM

## 2021-10-15 HISTORY — PX: ORIF TIBIA PLATEAU: SHX2132

## 2021-10-15 LAB — CBC
HCT: 39.8 % (ref 36.0–46.0)
Hemoglobin: 12.7 g/dL (ref 12.0–15.0)
MCH: 26 pg (ref 26.0–34.0)
MCHC: 31.9 g/dL (ref 30.0–36.0)
MCV: 81.6 fL (ref 80.0–100.0)
Platelets: 287 10*3/uL (ref 150–400)
RBC: 4.88 MIL/uL (ref 3.87–5.11)
RDW: 15.4 % (ref 11.5–15.5)
WBC: 7.7 10*3/uL (ref 4.0–10.5)
nRBC: 0 % (ref 0.0–0.2)

## 2021-10-15 LAB — GLUCOSE, CAPILLARY
Glucose-Capillary: 76 mg/dL (ref 70–99)
Glucose-Capillary: 98 mg/dL (ref 70–99)
Glucose-Capillary: 119 mg/dL — ABNORMAL HIGH (ref 70–99)

## 2021-10-15 LAB — BASIC METABOLIC PANEL
Anion gap: 12 (ref 5–15)
BUN: 15 mg/dL (ref 6–20)
CO2: 27 mmol/L (ref 22–32)
Calcium: 9.5 mg/dL (ref 8.9–10.3)
Chloride: 101 mmol/L (ref 98–111)
Creatinine, Ser: 1.09 mg/dL — ABNORMAL HIGH (ref 0.44–1.00)
GFR, Estimated: >60 mL/min (ref >60)
Glucose, Bld: 92 mg/dL (ref 70–99)
Potassium: 3 mmol/L — ABNORMAL LOW (ref 3.5–5.1)
Sodium: 140 mmol/L (ref 135–145)

## 2021-10-15 LAB — HEMOGLOBIN A1C
Hgb A1c MFr Bld: 5.8 % — ABNORMAL HIGH (ref 4.8–5.6)
Mean Plasma Glucose: 119.76 mg/dL

## 2021-10-15 SURGERY — OPEN REDUCTION INTERNAL FIXATION (ORIF) TIBIAL PLATEAU
Anesthesia: General | Laterality: Right

## 2021-10-15 MED ORDER — ROCURONIUM BROMIDE 10 MG/ML (PF) SYRINGE
PREFILLED_SYRINGE | INTRAVENOUS | Status: DC | PRN
  Administered 2021-10-15: 100 mg via INTRAVENOUS

## 2021-10-15 MED ORDER — SCOPOLAMINE 1 MG/3DAYS TD PT72
1.0000 | MEDICATED_PATCH | TRANSDERMAL | Status: DC
  Administered 2021-10-15: 1.5 mg via TRANSDERMAL
  Filled 2021-10-15: qty 1

## 2021-10-15 MED ORDER — METOCLOPRAMIDE HCL 5 MG PO TABS: 5.0000 mg | ORAL_TABLET | Freq: Three times a day (TID) | ORAL | Status: DC | PRN

## 2021-10-15 MED ORDER — METHOCARBAMOL 500 MG PO TABS
500.0000 mg | ORAL_TABLET | Freq: Four times a day (QID) | ORAL | Status: DC | PRN
  Administered 2021-10-15 – 2021-10-16 (×4): 500 mg via ORAL
  Filled 2021-10-15 (×4): qty 1

## 2021-10-15 MED ORDER — PHENYLEPHRINE 80 MCG/ML (10ML) SYRINGE FOR IV PUSH (FOR BLOOD PRESSURE SUPPORT)
PREFILLED_SYRINGE | INTRAVENOUS | Status: AC
  Filled 2021-10-15: qty 10

## 2021-10-15 MED ORDER — ORAL CARE MOUTH RINSE
15.0000 mL | Freq: Once | OROMUCOSAL | Status: AC
Start: 2021-10-15 — End: 2021-10-15

## 2021-10-15 MED ORDER — FENTANYL CITRATE (PF) 250 MCG/5ML IJ SOLN
INTRAMUSCULAR | Status: AC
  Filled 2021-10-15: qty 5

## 2021-10-15 MED ORDER — FENTANYL CITRATE (PF) 100 MCG/2ML IJ SOLN
INTRAMUSCULAR | Status: AC
  Filled 2021-10-15: qty 2

## 2021-10-15 MED ORDER — METHOCARBAMOL 1000 MG/10ML IJ SOLN
500.0000 mg | Freq: Four times a day (QID) | INTRAVENOUS | Status: DC | PRN
  Filled 2021-10-15: qty 5

## 2021-10-15 MED ORDER — POLYETHYLENE GLYCOL 3350 17 G PO PACK: 17.0000 g | PACK | Freq: Every day | ORAL | Status: DC | PRN

## 2021-10-15 MED ORDER — FENTANYL CITRATE (PF) 250 MCG/5ML IJ SOLN
INTRAMUSCULAR | Status: DC | PRN
  Administered 2021-10-15: 100 ug via INTRAVENOUS

## 2021-10-15 MED ORDER — CELECOXIB 200 MG PO CAPS
200.0000 mg | ORAL_CAPSULE | Freq: Once | ORAL | Status: AC
  Administered 2021-10-15: 200 mg via ORAL
  Filled 2021-10-15: qty 1

## 2021-10-15 MED ORDER — MIDAZOLAM HCL 2 MG/2ML IJ SOLN
INTRAMUSCULAR | Status: DC | PRN
  Administered 2021-10-15: 2 mg via INTRAVENOUS

## 2021-10-15 MED ORDER — BUPIVACAINE HCL (PF) 0.25 % IJ SOLN
INTRAMUSCULAR | Status: DC | PRN
  Administered 2021-10-15: 8 mL

## 2021-10-15 MED ORDER — HYDROMORPHONE HCL 1 MG/ML IJ SOLN: 0.5000 mg | INTRAMUSCULAR | Status: DC | PRN

## 2021-10-15 MED ORDER — ONDANSETRON HCL 4 MG PO TABS: 4.0000 mg | ORAL_TABLET | Freq: Four times a day (QID) | ORAL | Status: DC | PRN

## 2021-10-15 MED ORDER — DEXAMETHASONE SODIUM PHOSPHATE 10 MG/ML IJ SOLN
INTRAMUSCULAR | Status: DC | PRN
  Administered 2021-10-15: 4 mg via INTRAVENOUS

## 2021-10-15 MED ORDER — ACETAMINOPHEN 325 MG PO TABS: 325.0000 mg | ORAL_TABLET | Freq: Four times a day (QID) | ORAL | Status: DC | PRN

## 2021-10-15 MED ORDER — LACTATED RINGERS IV SOLN: INTRAVENOUS | Status: DC

## 2021-10-15 MED ORDER — PHENYLEPHRINE 80 MCG/ML (10ML) SYRINGE FOR IV PUSH (FOR BLOOD PRESSURE SUPPORT)
PREFILLED_SYRINGE | INTRAVENOUS | Status: DC | PRN
  Administered 2021-10-15: 80 ug via INTRAVENOUS

## 2021-10-15 MED ORDER — ONDANSETRON HCL 4 MG/2ML IJ SOLN
INTRAMUSCULAR | Status: AC
Start: 2021-10-15 — End: ?
  Filled 2021-10-15: qty 8

## 2021-10-15 MED ORDER — ROCURONIUM BROMIDE 10 MG/ML (PF) SYRINGE
PREFILLED_SYRINGE | INTRAVENOUS | Status: AC
  Filled 2021-10-15: qty 10

## 2021-10-15 MED ORDER — MIDAZOLAM HCL 2 MG/2ML IJ SOLN
INTRAMUSCULAR | Status: AC
Start: 2021-10-15 — End: ?
  Filled 2021-10-15: qty 2

## 2021-10-15 MED ORDER — AMISULPRIDE (ANTIEMETIC) 5 MG/2ML IV SOLN: 10.0000 mg | Freq: Once | INTRAVENOUS | Status: DC | PRN

## 2021-10-15 MED ORDER — CEFAZOLIN SODIUM-DEXTROSE 2-4 GM/100ML-% IV SOLN
2.0000 g | INTRAVENOUS | Status: AC
  Administered 2021-10-15: 2 g via INTRAVENOUS
  Filled 2021-10-15: qty 100

## 2021-10-15 MED ORDER — OXYCODONE HCL 5 MG PO TABS
5.0000 mg | ORAL_TABLET | ORAL | Status: DC | PRN
  Administered 2021-10-15 – 2021-10-16 (×5): 10 mg via ORAL
  Filled 2021-10-15 (×5): qty 2

## 2021-10-15 MED ORDER — CHLORHEXIDINE GLUCONATE 0.12 % MT SOLN
15.0000 mL | Freq: Once | OROMUCOSAL | Status: AC
Start: 2021-10-15 — End: 2021-10-15
  Administered 2021-10-15: 15 mL via OROMUCOSAL
  Filled 2021-10-15: qty 15

## 2021-10-15 MED ORDER — INSULIN ASPART 100 UNIT/ML IJ SOLN
0.0000 [IU] | Freq: Three times a day (TID) | INTRAMUSCULAR | Status: DC
  Administered 2021-10-16: 3 [IU] via SUBCUTANEOUS

## 2021-10-15 MED ORDER — LIDOCAINE 2% (20 MG/ML) 5 ML SYRINGE
INTRAMUSCULAR | Status: DC | PRN
  Administered 2021-10-15 (×2): 100 mg via INTRAVENOUS
  Administered 2021-10-15: 50 mg via INTRAVENOUS

## 2021-10-15 MED ORDER — HYDROMORPHONE HCL 1 MG/ML IJ SOLN
INTRAMUSCULAR | Status: DC | PRN
  Administered 2021-10-15: .5 mg via INTRAVENOUS

## 2021-10-15 MED ORDER — BUPIVACAINE HCL (PF) 0.25 % IJ SOLN
INTRAMUSCULAR | Status: AC
  Filled 2021-10-15: qty 30

## 2021-10-15 MED ORDER — ASPIRIN 325 MG PO TABS
325.0000 mg | ORAL_TABLET | Freq: Every day | ORAL | Status: DC
  Administered 2021-10-16: 325 mg via ORAL
  Filled 2021-10-15: qty 1

## 2021-10-15 MED ORDER — FENTANYL CITRATE (PF) 100 MCG/2ML IJ SOLN
25.0000 ug | INTRAMUSCULAR | Status: DC | PRN
  Administered 2021-10-15 (×3): 50 ug via INTRAVENOUS

## 2021-10-15 MED ORDER — SODIUM CHLORIDE 0.9 % IV SOLN: INTRAVENOUS | Status: DC

## 2021-10-15 MED ORDER — METOCLOPRAMIDE HCL 5 MG/ML IJ SOLN: 5.0000 mg | Freq: Three times a day (TID) | INTRAMUSCULAR | Status: DC | PRN

## 2021-10-15 MED ORDER — SUCCINYLCHOLINE CHLORIDE 200 MG/10ML IV SOSY
PREFILLED_SYRINGE | INTRAVENOUS | Status: AC
  Filled 2021-10-15: qty 10

## 2021-10-15 MED ORDER — PROMETHAZINE HCL 25 MG/ML IJ SOLN: 6.2500 mg | INTRAMUSCULAR | Status: DC | PRN

## 2021-10-15 MED ORDER — ACETAMINOPHEN 500 MG PO TABS
1000.0000 mg | ORAL_TABLET | Freq: Once | ORAL | Status: AC
  Administered 2021-10-15: 1000 mg via ORAL
  Filled 2021-10-15: qty 2

## 2021-10-15 MED ORDER — ONDANSETRON HCL 4 MG/2ML IJ SOLN
INTRAMUSCULAR | Status: DC | PRN
  Administered 2021-10-15: 4 mg via INTRAVENOUS

## 2021-10-15 MED ORDER — ATROPINE SULFATE 0.4 MG/ML IV SOLN
INTRAVENOUS | Status: AC
Start: 2021-10-15 — End: ?
  Filled 2021-10-15: qty 1

## 2021-10-15 MED ORDER — DOCUSATE SODIUM 100 MG PO CAPS
100.0000 mg | ORAL_CAPSULE | Freq: Two times a day (BID) | ORAL | Status: DC
  Administered 2021-10-15 – 2021-10-16 (×2): 100 mg via ORAL
  Filled 2021-10-15 (×2): qty 1

## 2021-10-15 MED ORDER — LIDOCAINE 2% (20 MG/ML) 5 ML SYRINGE
INTRAMUSCULAR | Status: AC
Start: 2021-10-15 — End: ?
  Filled 2021-10-15: qty 5

## 2021-10-15 MED ORDER — DEXAMETHASONE SODIUM PHOSPHATE 10 MG/ML IJ SOLN
INTRAMUSCULAR | Status: AC
  Filled 2021-10-15: qty 4

## 2021-10-15 MED ORDER — HYDROMORPHONE HCL 1 MG/ML IJ SOLN
INTRAMUSCULAR | Status: AC
  Filled 2021-10-15: qty 0.5

## 2021-10-15 MED ORDER — 0.9 % SODIUM CHLORIDE (POUR BTL) OPTIME
TOPICAL | Status: DC | PRN
  Administered 2021-10-15: 1000 mL

## 2021-10-15 MED ORDER — ONDANSETRON HCL 4 MG/2ML IJ SOLN: 4.0000 mg | Freq: Four times a day (QID) | INTRAMUSCULAR | Status: DC | PRN

## 2021-10-15 MED ORDER — PROPOFOL 10 MG/ML IV BOLUS
INTRAVENOUS | Status: DC | PRN
  Administered 2021-10-15: 180 mg via INTRAVENOUS

## 2021-10-15 MED ORDER — PROPOFOL 10 MG/ML IV BOLUS
INTRAVENOUS | Status: AC
Start: 2021-10-15 — End: ?
  Filled 2021-10-15: qty 20

## 2021-10-15 SURGICAL SUPPLY — 81 items
BAG COUNTER SPONGE SURGICOUNT (BAG) ×2 IMPLANT
BIT DRILL 100X2.5XANTM LCK (BIT) IMPLANT
BIT DRILL CAL (BIT) IMPLANT
BIT DRILL SLEEVE MEASURING (BIT) IMPLANT
BIT DRL 100X2.5XANTM LCK (BIT) ×1
BLADE SURG 10 STRL SS (BLADE) ×2 IMPLANT
BNDG ELASTIC 4X5.8 VLCR STR LF (GAUZE/BANDAGES/DRESSINGS) IMPLANT
BNDG ELASTIC 6X15 VLCR STRL LF (GAUZE/BANDAGES/DRESSINGS) ×1 IMPLANT
BNDG ELASTIC 6X5.8 VLCR STR LF (GAUZE/BANDAGES/DRESSINGS) IMPLANT
BNDG GAUZE ELAST 4 BULKY (GAUZE/BANDAGES/DRESSINGS) IMPLANT
BONE CANC CHIPS 20CC PCAN1/4 (Bone Implant) ×2 IMPLANT
CHIPS CANC BONE 20CC PCAN1/4 (Bone Implant) ×1 IMPLANT
CLEANER TIP ELECTROSURG 2X2 (MISCELLANEOUS) ×2 IMPLANT
COVER MAYO STAND STRL (DRAPES) ×2 IMPLANT
COVER SURGICAL LIGHT HANDLE (MISCELLANEOUS) ×2 IMPLANT
CUFF TOURN SGL QUICK 34 (TOURNIQUET CUFF) ×2
CUFF TRNQT CYL 34X4.125X (TOURNIQUET CUFF) IMPLANT
DRAPE C-ARM 42X72 X-RAY (DRAPES) IMPLANT
DRAPE INCISE IOBAN 66X45 STRL (DRAPES) ×2 IMPLANT
DRAPE U-SHAPE 47X51 STRL (DRAPES) ×2 IMPLANT
DRILL BIT 2.5MM (BIT) ×2
DRILL BIT CAL (BIT) ×2
DRILL SLEEVE MEASURING (BIT) ×2
DRSG ADAPTIC 3X8 NADH LF (GAUZE/BANDAGES/DRESSINGS) IMPLANT
DRSG PAD ABDOMINAL 8X10 ST (GAUZE/BANDAGES/DRESSINGS) ×2 IMPLANT
DRSG XEROFORM 1X8 (GAUZE/BANDAGES/DRESSINGS) ×1 IMPLANT
DURAPREP 26ML APPLICATOR (WOUND CARE) ×2 IMPLANT
ELECT REM PT RETURN 9FT ADLT (ELECTROSURGICAL) ×2
ELECTRODE REM PT RTRN 9FT ADLT (ELECTROSURGICAL) ×1 IMPLANT
EVACUATOR 1/8 PVC DRAIN (DRAIN) ×1 IMPLANT
GAUZE SPONGE 4X4 12PLY STRL (GAUZE/BANDAGES/DRESSINGS) ×1 IMPLANT
GLOVE BIOGEL PI IND STRL 7.5 (GLOVE) ×1 IMPLANT
GLOVE BIOGEL PI IND STRL 8 (GLOVE) ×1 IMPLANT
GLOVE BIOGEL PI INDICATOR 7.5 (GLOVE) ×1
GLOVE BIOGEL PI INDICATOR 8 (GLOVE) ×1
GLOVE ECLIPSE 7.0 STRL STRAW (GLOVE) ×2 IMPLANT
GLOVE ORTHO TXT STRL SZ7.5 (GLOVE) ×2 IMPLANT
GOWN STRL REUS W/ TWL XL LVL3 (GOWN DISPOSABLE) ×1 IMPLANT
GOWN STRL REUS W/TWL XL LVL3 (GOWN DISPOSABLE) ×2
GRAFT BNE CANC CHIPS 1-8 20CC (Bone Implant) IMPLANT
IMMOBILIZER KNEE 20 (SOFTGOODS) ×2
IMMOBILIZER KNEE 20 THIGH 36 (SOFTGOODS) IMPLANT
K-WIRE ACE 1.6X6 (WIRE) ×6
KIT BASIN OR (CUSTOM PROCEDURE TRAY) ×2 IMPLANT
KIT TURNOVER KIT B (KITS) ×2 IMPLANT
KWIRE ACE 1.6X6 (WIRE) IMPLANT
MANIFOLD NEPTUNE II (INSTRUMENTS) ×2 IMPLANT
NDL HYPO 25X1 1.5 SAFETY (NEEDLE) ×1 IMPLANT
NEEDLE HYPO 25X1 1.5 SAFETY (NEEDLE) ×2 IMPLANT
NS IRRIG 1000ML POUR BTL (IV SOLUTION) ×2 IMPLANT
PACK ORTHO EXTREMITY (CUSTOM PROCEDURE TRAY) ×2 IMPLANT
PAD ARMBOARD 7.5X6 YLW CONV (MISCELLANEOUS) ×4 IMPLANT
PAD CAST 4YDX4 CTTN HI CHSV (CAST SUPPLIES) IMPLANT
PADDING CAST COTTON 4X4 STRL (CAST SUPPLIES)
PADDING CAST COTTON 6X4 STRL (CAST SUPPLIES) ×3 IMPLANT
PLATE LOCK 3H STD RT PROX TIB (Plate) ×1 IMPLANT
SCREW CORT T15 TPR 55X3.5XST (Screw) IMPLANT
SCREW CORTICAL 3.5MM 36MM (Screw) ×1 IMPLANT
SCREW CORTICAL 3.5X55MM (Screw) ×2 IMPLANT
SCREW LOCK CORT STAR 3.5X50 (Screw) ×1 IMPLANT
SCREW LOCK CORT STAR 3.5X56 (Screw) ×1 IMPLANT
SCREW LOCK CORT STAR 3.5X70 (Screw) ×3 IMPLANT
SCREW LP 3.5X75MM (Screw) ×1 IMPLANT
SPONGE T-LAP 18X18 ~~LOC~~+RFID (SPONGE) ×2 IMPLANT
STAPLER VISISTAT 35W (STAPLE) ×1 IMPLANT
STOCKINETTE IMPERVIOUS LG (DRAPES) ×2 IMPLANT
SUCTION FRAZIER HANDLE 10FR (MISCELLANEOUS) ×2
SUCTION TUBE FRAZIER 10FR DISP (MISCELLANEOUS) ×1 IMPLANT
SUT ETHIBOND 2 0 V5 (SUTURE) ×1 IMPLANT
SUT VIC AB 0 CT1 27 (SUTURE) ×2
SUT VIC AB 0 CT1 27XBRD ANBCTR (SUTURE) ×1 IMPLANT
SUT VIC AB 1 CT1 27 (SUTURE) ×2
SUT VIC AB 1 CT1 27XBRD ANBCTR (SUTURE) ×1 IMPLANT
SUT VIC AB 2-0 CT1 27 (SUTURE)
SUT VIC AB 2-0 CT1 TAPERPNT 27 (SUTURE) IMPLANT
SYR CONTROL 10ML LL (SYRINGE) ×2 IMPLANT
TOWEL GREEN STERILE (TOWEL DISPOSABLE) ×2 IMPLANT
TOWEL GREEN STERILE FF (TOWEL DISPOSABLE) ×2 IMPLANT
TUBE CONNECTING 12X1/4 (SUCTIONS) ×2 IMPLANT
WATER STERILE IRR 1000ML POUR (IV SOLUTION) ×4 IMPLANT
YANKAUER SUCT BULB TIP NO VENT (SUCTIONS) ×2 IMPLANT

## 2021-10-15 NOTE — Anesthesia Procedure Notes (Signed)
Procedure Name: Intubation Date/Time: 10/15/2021 5:33 PM  Performed by: Anastasio Auerbach, CRNAPre-anesthesia Checklist: Patient identified, Emergency Drugs available, Suction available and Patient being monitored Patient Re-evaluated:Patient Re-evaluated prior to induction Oxygen Delivery Method: Circle system utilized Preoxygenation: Pre-oxygenation with 100% oxygen Induction Type: IV induction Ventilation: Mask ventilation without difficulty Laryngoscope Size: Mac and 3 Grade View: Grade I Tube type: Oral Tube size: 7.5 mm Number of attempts: 1 Airway Equipment and Method: Stylet and Oral airway Placement Confirmation: ETT inserted through vocal cords under direct vision, positive ETCO2 and breath sounds checked- equal and bilateral Secured at: 22 (at teeth) cm Tube secured with: Tape Dental Injury: Teeth and Oropharynx as per pre-operative assessment

## 2021-10-15 NOTE — Op Note (Signed)
Pre and postop diagnosis: Comminuted right lateral tibial plateau fracture with central joint depression.  Procedure: ORIF right lateral tibial plateau, allograft cancellous bone graft and plate fixation.  Surgeon: Rodell Perna MD  Anesthesia General orotracheal plus Marcaine skin local  Implants  Implants  BONE [facility] CHIPS [facility] PCAN1/4 - [id number]  Inventory Item: BONE [facility] CHIPS 20CC PCAN1/4 Serial no.: [id number] Model/Cat no.: [id number]  Implant name: BONE [facility] CHIPS [facility] PCAN1/4 - [id number] Laterality: Right Area: Tibia  Manufacturer: [employer]hart Date of Manufacture:    Action: Implanted Number Used: 1   Device Identifier:  Device Identifier Type:     PLATE LOCK 3H STD RT PROX TIB - [id number]  Inventory Item: PLATE LOCK 3H STD RT PROX TIB Serial no.:  Model/Cat no.: [id number]  Implant name: [place] 3H STD RT PROX TIB - [id number] Laterality: Right Area: Knee  Manufacturer: [employer]IMMER [employer](ORTH,TRAU,BIO,SG) Date of Manufacture:    Action: Implanted Number Used: 1   Device Identifier:  Device Identifier Type:     SCREW CORTICAL 3.5X55MM - [id number]  Inventory Item: SCREW CORTICAL 3.5X55MM Serial no.:  Model/Cat no.: [id number]  Implant name: SCREW CORTICAL 3.5X55MM - [id number] Laterality: Right Area: Knee  Manufacturer: [employer]IM[employer]ON(ORTH,TRAU,BIO,SG) Date of Manufacture:    Action: Implanted Number Used: 1   Device Identifier:  Device Identifier Type:     SCREW LP 3.5X75MM - [id number]  Inventory Item: SCREW LP 3.5X75MM Serial no.:  Model/Cat no.: [id number]  Implant name: SCREW LP 3.5X75MM - [id number] Laterality: Right Area: Knee  Manufacturer: [employer]IM[employer](ORTH,TRAU,BIO,SG) Date of Manufacture:    Action: Implanted Number Used: 1   Device Identifier:  Device Identifier Type:     SCREW CORTICAL 3.5MM 36MM - [id number]  Inventory Item: SCREW CORTICAL 3.5MM 36MM Serial no.:  Model/Cat no.: [id number]  Implant name: SCREW CORTICAL 3.5MM 36MM - [id number] Laterality:  Right Area: Knee  Manufacturer: ZIMMER RECON(ORTH,TRAU,BIO,SG) Date of Manufacture:    Action: Implanted Number Used: 1   Device Identifier:  Device Identifier Type:     SCREW LOCK CORT STAR 3.5X50 - [id number]  Inventory Item: SCREW LOCK CORT STAR 3.5X50 Serial no.:  Model/Cat no.: [id number]  Implant name: SCREW LOCK CORT STAR 3.5X50 - [id number] Laterality: Right Area: Knee  Manufacturer: ZIMMER RECON(ORTH,TRAU,BIO,SG) Date of Manufacture:    Action: Implanted Number Used: 1   Device Identifier:  Device Identifier Type:     SCREW LOCK CORT STAR 3.5X70 - [id number]  Inventory Item: SCREW LOCK CORT STAR 3.5X70 Serial no.:  Model/Cat no.: [id number]  Implant name: SCREW LOCK CORT STAR 3.5X70 - [id number] Laterality: Right Area: Knee  Manufacturer: ZIMMER RECON(ORTH,TRAU,BIO,SG) Date of Manufacture:    Action: Implanted Number Used: 3   Device Identifier:  Device Identifier Type:     SCREW LOCK CORT STAR 3.5X56 - [id number]  Inventory Item: SCREW LOCK CORT STAR 3.5X56 Serial no.:  Model/Cat no.: [id number]  Implant name: Zack Seal CORT STAR 3.5X56 - [id number] Laterality: Right Area: Knee  Manufacturer: ZIMMER RECON(ORTH,TRAU,BIO,SG) Date of Manufacture:    Action: Implanted Number Used: 1   Device Identifier:  Device Identifier Type:      Tourniquet: 300 times approximately 45 minutes.  Procedure:-Induction general anesthesia preoperative Ancef prophylaxis spine position proximal thigh tourniquet applied by me prepping with DuraPrep tip the toes to the tourniquet Ancef prophylaxis timeout procedure extremity sheet impervious, Coban sterile skin marker as shaped incision Betadine Steri-Drape and timeout procedure completed.  As shaped incision was made subperiosteal dissection peeling the anterior compartment away from the  crest of the tibia exposing the fracture site.  Gertie's tubercle was exposed and a needle was used to palpate directly into the joint space and a 3 hole Biomet lateral  tibial plateau compression plate was selected.  Small cortical window was made in the metaphyseal region oval as best can be made with accordion chest down the lateral bone tach to be inserted and using the bone tap as well as a freer elevator was carefully pushed up the depressed fragment packed bone fragments of cancellous chips 20 cc package of bone chips that had the sterile saline placed in it they were impacted and C-arm visualization showed that the fracture depressed fragment was now anatomic.  There is still some lateral split but with the first compression screw nonlocking to suck the plate down directly and reduce this anatomically.  Fracture line cannot be seen in AP and lateral fluoroscopic image.  Distal plate was fixed with a nonlocking screw and then multiple locking screws were placed except for the proximal row most anterior most posterior screw holes.  All screws were parallel to the joint couple millimeters below the joint anatomic had good fixation and were not too long did not penetrate through the medial cortex.  Irrigation with saline solution tourniquet deflation hemostasis with Bovie electrocautery Hemovac drain placed.  #1 Vicryl 2-0 Vicryl subtenons tissue skin staple closure Marcaine infiltration 8 cc Xeroform 4 x 4's ABD web roll Ace wrap and knee immobilizer was applied patient tolerated procedure well compartment was soft and patient admitted keeping her leg elevated and had actually minimal hemarthrosis and minimal swelling of the anterior compartment preoperatively and compartment remained soft at the end of the case.  Good capillary refill bounding pulses and transferred to care room in stable condition.

## 2021-10-15 NOTE — Interval H&P Note (Signed)
History and Physical Interval Note:  10/15/2021 5:00 PM  [name]  has presented today for surgery, with the diagnosis of right lateral tibial plateau fracture.  The various methods of treatment have been discussed with the patient and family. After consideration of risks, benefits and other options for treatment, the patient has consented to  Procedure(s): OPEN REDUCTION INTERNAL FIXATION (ORIF) RIGHT LATERAL TIBIAL PLATEAU FRACTURE, ALLOGRAFT BONE CHIPS (Right) as a surgical intervention.  The patient's history has been reviewed, patient examined, no change in status, stable for surgery.  I have reviewed the patient's chart and labs.  Questions were answered to the patient's satisfaction.     [name]

## 2021-10-15 NOTE — Transfer of Care (Signed)
Immediate Anesthesia Transfer of Care Note  Patient: [name]  Procedure(s) Performed: OPEN REDUCTION INTERNAL FIXATION (ORIF) RIGHT LATERAL TIBIAL PLATEAU FRACTURE, ALLOGRAFT BONE CHIPS (Right)  Patient Location: PACU  Anesthesia Type:General  Level of Consciousness: awake, alert  and oriented  Airway & Oxygen Therapy: Patient Spontanous Breathing and Patient connected to nasal cannula oxygen  Post-op Assessment: Report given to RN and Post -op Vital signs reviewed and stable  Post vital signs: Reviewed and stable  Last Vitals:  Vitals Value Taken Time  BP 133/86   Temp    Pulse 73   Resp 12 10/15/21 1857  SpO2 99   Vitals shown include unvalidated device data.  Last Pain:  Vitals:   10/15/21 1616  TempSrc:   PainSc: 0-No pain         Complications: No notable events documented.
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
## 2021-10-15 NOTE — Anesthesia Postprocedure Evaluation (Signed)
Anesthesia Post Note  Patient: [name]  Procedure(s) Performed: OPEN REDUCTION INTERNAL FIXATION (ORIF) RIGHT LATERAL TIBIAL PLATEAU FRACTURE, ALLOGRAFT BONE CHIPS (Right)     Patient location during evaluation: PACU Anesthesia Type: General Level of consciousness: awake and alert Pain management: pain level controlled Vital Signs Assessment: post-procedure vital signs reviewed and stable Respiratory status: spontaneous breathing, nonlabored ventilation and respiratory function stable Cardiovascular status: blood pressure returned to baseline and stable Postop Assessment: no apparent nausea or vomiting Anesthetic complications: no   No notable events documented.  Last Vitals:  Vitals:   10/15/21 1945 10/15/21 2034  BP: (!) 144/86 113/82  Pulse: 75 70  Resp: 18 18  Temp: 36.6 C 36.6 C  SpO2: 97% 100%    Last Pain:  Vitals:   10/15/21 2044  TempSrc:   PainSc: 8                  Verdean Murin,W. EDMOND

## 2021-10-15 NOTE — Anesthesia Preprocedure Evaluation (Addendum)
Anesthesia Evaluation  Patient identified by MRN, date of birth, ID band Patient awake    Reviewed: Allergy & Precautions, NPO status , Patient's Chart, lab work & pertinent test results  History of Anesthesia Complications Negative for: history of anesthetic complications  Airway Mallampati: II  TM Distance: >3 FB Neck ROM: Full    Dental no notable dental hx. (+) Dental Advisory Given   Pulmonary neg pulmonary ROS,    Pulmonary exam normal        Cardiovascular hypertension, Pt. on home beta blockers Normal cardiovascular exam     Neuro/Psych negative neurological ROS     GI/Hepatic negative GI ROS, Neg liver ROS,   Endo/Other  diabetesMorbid obesity  Renal/GU negative Renal ROS     Musculoskeletal negative musculoskeletal ROS (+)   Abdominal   Peds  Hematology negative hematology ROS (+)   Anesthesia Other Findings   Reproductive/Obstetrics                            Anesthesia Physical Anesthesia Plan  ASA: 3  Anesthesia Plan: General   Post-op Pain Management: Celebrex PO (pre-op)* and Tylenol PO (pre-op)*   Induction: Intravenous  PONV Risk Score and Plan: 4 or greater and Ondansetron, Dexamethasone, Midazolam and Scopolamine patch - Pre-op  Airway Management Planned: Oral ETT  Additional Equipment:   Intra-op Plan:   Post-operative Plan: Extubation in OR  Informed Consent: I have reviewed the patients History and Physical, chart, labs and discussed the procedure including the risks, benefits and alternatives for the proposed anesthesia with the patient or authorized representative who has indicated his/her understanding and acceptance.     Dental advisory given  Plan Discussed with: Anesthesiologist and CRNA  Anesthesia Plan Comments:        Anesthesia Quick Evaluation

## 2021-10-15 NOTE — Interval H&P Note (Signed)
History and Physical Interval Note:  10/15/2021 5:02 PM  [name]  has presented today for surgery, with the diagnosis of right lateral tibial plateau fracture.  The various methods of treatment have been discussed with the patient and family. After consideration of risks, benefits and other options for treatment, the patient has consented to  Procedure(s): OPEN REDUCTION INTERNAL FIXATION (ORIF) RIGHT LATERAL TIBIAL PLATEAU FRACTURE, ALLOGRAFT BONE CHIPS (Right) as a surgical intervention.  The patient's history has been reviewed, patient examined, no change in status, stable for surgery.  I have reviewed the patient's chart and labs.  Questions were answered to the patient's satisfaction.     [name]

## 2021-10-16 ENCOUNTER — Other Ambulatory Visit (HOSPITAL_COMMUNITY): Payer: Self-pay

## 2021-10-16 ENCOUNTER — Encounter (HOSPITAL_COMMUNITY): Payer: Self-pay | Admitting: Orthopaedic Surgery

## 2021-10-16 ENCOUNTER — Other Ambulatory Visit: Payer: Self-pay | Admitting: Orthopaedic Surgery

## 2021-10-16 DIAGNOSIS — M5134 Other intervertebral disc degeneration, thoracic region: Secondary | ICD-10-CM

## 2021-10-16 DIAGNOSIS — I10 Essential (primary) hypertension: Secondary | ICD-10-CM | POA: Diagnosis not present

## 2021-10-16 DIAGNOSIS — Z8673 Personal history of transient ischemic attack (TIA), and cerebral infarction without residual deficits: Secondary | ICD-10-CM | POA: Diagnosis not present

## 2021-10-16 DIAGNOSIS — M546 Pain in thoracic spine: Secondary | ICD-10-CM

## 2021-10-16 DIAGNOSIS — E119 Type 2 diabetes mellitus without complications: Secondary | ICD-10-CM | POA: Diagnosis not present

## 2021-10-16 DIAGNOSIS — S82121A Displaced fracture of lateral condyle of right tibia, initial encounter for closed fracture: Secondary | ICD-10-CM | POA: Diagnosis not present

## 2021-10-16 LAB — BASIC METABOLIC PANEL
Anion gap: 13 (ref 5–15)
BUN: 17 mg/dL (ref 6–20)
CO2: 25 mmol/L (ref 22–32)
Calcium: 9.2 mg/dL (ref 8.9–10.3)
Chloride: 98 mmol/L (ref 98–111)
Creatinine, Ser: 1.14 mg/dL — ABNORMAL HIGH (ref 0.44–1.00)
GFR, Estimated: 57 mL/min — ABNORMAL LOW (ref >60)
Glucose, Bld: 228 mg/dL — ABNORMAL HIGH (ref 70–99)
Potassium: 2.9 mmol/L — ABNORMAL LOW (ref 3.5–5.1)
Sodium: 136 mmol/L (ref 135–145)

## 2021-10-16 LAB — CBC
HCT: 35.4 % — ABNORMAL LOW (ref 36.0–46.0)
Hemoglobin: 11.5 g/dL — ABNORMAL LOW (ref 12.0–15.0)
MCH: 26.1 pg (ref 26.0–34.0)
MCHC: 32.5 g/dL (ref 30.0–36.0)
MCV: 80.3 fL (ref 80.0–100.0)
Platelets: 260 10*3/uL (ref 150–400)
RBC: 4.41 MIL/uL (ref 3.87–5.11)
RDW: 15.2 % (ref 11.5–15.5)
WBC: 7.3 10*3/uL (ref 4.0–10.5)
nRBC: 0 % (ref 0.0–0.2)

## 2021-10-16 LAB — GLUCOSE, CAPILLARY
Glucose-Capillary: 187 mg/dL — ABNORMAL HIGH (ref 70–99)
Glucose-Capillary: 96 mg/dL (ref 70–99)
Glucose-Capillary: 95 mg/dL (ref 70–99)

## 2021-10-16 MED ORDER — ORAL CARE MOUTH RINSE
15.0000 mL | OROMUCOSAL | Status: DC | PRN
Start: 2021-10-16 — End: 2021-10-17

## 2021-10-16 MED ORDER — OXYCODONE-ACETAMINOPHEN 5-325 MG PO TABS
1.0000 | ORAL_TABLET | Freq: Four times a day (QID) | ORAL | 0 refills | Status: DC | PRN
  Filled 2021-10-16: qty 40, 5d supply, fill #0

## 2021-10-16 MED ORDER — TIZANIDINE HCL 2 MG PO TABS
2.0000 mg | ORAL_TABLET | Freq: Three times a day (TID) | ORAL | 1 refills | Status: DC | PRN
  Filled 2021-10-16: qty 90, 30d supply, fill #0
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
## 2021-10-16 NOTE — Progress Notes (Signed)
DME has not been delivered at this time.  Called ED-CM.

## 2021-10-16 NOTE — Progress Notes (Addendum)
Discharge instructions given to the patient.  Education emphasized on surgical incision/dressing maintenance, signs of infection to watch for, and reiterated PT education on activity limitations on RLE.  Patient verbalized understanding.  Encouraged to call the doctor for concerns.  Right knee dressing CDI, knee immobilizer in place.  Needs addressed.  Waiting for DME.
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
## 2021-10-16 NOTE — TOC Initial Note (Signed)
Transition of Care [facility]) - Initial/Assessment Note    Patient Details  Name: [name] MRN: [id number] Date of Birth: [birth year]  Transition of [facility]) CM/SW Contact:    Marilu Favre, RN Phone Number: 10/16/2021, 3:20 PM  Clinical Narrative:                  PT recommending HHPT , walker and 3 in1 , spoke to DR Lorin Mercy via phone and received verbal orders.   Patient from home with husband.   Confirmed face sheet information.   Cindie with [facility] accepted referral for HHPT.   Called [employer] for walker and 3 in 1 . DME will be delivered to patient's room.   Expected Discharge Plan: [place] Barriers to Discharge: No Barriers Identified   Patient Goals and CMS Choice Patient states their goals for this hospitalization and ongoing recovery are:: to return tohome CMS Medicare.gov Compare Post Acute Care list provided to:: Patient Choice offered to / list presented to : Patient  Expected Discharge Plan and Services Expected Discharge Plan: [place]   Discharge Planning Services: CM Consult Post Acute Care Choice: [place] arrangements for the past 2 months: Apartment Expected Discharge Date: 10/16/21               DME Arranged: Berta Minor rolling   Date DME Agency Contacted: 10/16/21 Time DME Agency Contacted: [phone] Representative spoke with at DME Agency: [place]: PT [place]: [place] Date [place]: 10/16/21 Time [place]: 1519 Representative spoke with at [place]: [place] Arrangements/Services Living arrangements for the past 2 months: [place] with:: Spouse Patient language and need for interpreter reviewed:: Yes Do you feel safe going back to the place where you live?: Yes      Need for Family Participation in Patient Care: Yes (Comment) Care giver support system in place?: Yes (comment)   Criminal Activity/Legal Involvement  Pertinent to Current Situation/Hospitalization: No - Comment as needed  Activities of Daily Living Home Assistive Devices/Equipment: CBG Meter, Blood pressure cuff, Eyeglasses ADL Screening (condition at time of admission) Patient's cognitive ability adequate to safely complete daily activities?: Yes Is the patient deaf or have difficulty hearing?: No Does the patient have difficulty seeing, even when wearing glasses/contacts?: No Does the patient have difficulty concentrating, remembering, or making decisions?: No Patient able to express need for assistance with ADLs?: Yes Does the patient have difficulty dressing or bathing?: No Independently performs ADLs?: Yes (appropriate for developmental age) Does the patient have difficulty walking or climbing stairs?: Yes Weakness of Legs: Right Weakness of Arms/Hands: None  Permission Sought/Granted   Permission granted to share information with : Yes, Verbal Permission Granted  Share Information with NAME: husband Elberta Fortis           Emotional Assessment Appearance:: Appears stated age Attitude/Demeanor/Rapport: Engaged Affect (typically observed): Accepting Orientation: : Oriented to Self, Oriented to Place, Oriented to  Time, Oriented to Situation Alcohol / Substance Use: Not Applicable Psych Involvement: No (comment)  Admission diagnosis:  Tibial plateau fracture, right [S82.141A] Patient Active Problem List   Diagnosis Date Noted   Tibial plateau fracture, right 10/15/2021   Hot flash, menopausal 08/02/2020   Acute left-sided thoracic back pain 11/29/2019   Need for pneumococcal vaccination 11/29/2019   Need for shingles vaccine 11/29/2019   DDD (degenerative disc disease), thoracic 11/29/2019   Stage 3a chronic kidney disease ([place]) 08/11/2019  Primary osteoarthritis involving multiple joints 08/10/2019   Arthritis of carpometacarpal [facility]) joint of right thumb 08/10/2019   Degenerative arthritis of knee, bilateral 08/10/2019    Anemia due to acquired thiamine deficiency 05/18/2019   Deficiency anemia 05/12/2019   Vitamin D insufficiency 12/17/2017   S/P laparoscopic sleeve gastrectomy 12/16/2017   Chronic venous insufficiency 05/10/2017   Venous stasis syndrome 10/27/2016   Morbid obesity ([place]) 08/11/2016   Snoring 12/05/2015   Type 2 diabetes mellitus with other specified complication ([place]) 77/06/4033   Hyperlipidemia associated with type 2 diabetes mellitus ([place]) 09/13/2014   Dermatitis, asteototic 09/05/2014   Alkaline phosphatase elevation 04/25/2013   Carpal tunnel syndrome 12/30/2012   Other screening mammogram 10/10/2012   Severe obesity (BMI >= 40) ([place]) 12/25/2010   Routine general medical examination at a health care facility 09/16/2010   Essential hypertension, benign 02/27/2009   Osteoarthritis 02/27/2009   PCP:  Martin[place], Betty G, MD Pharmacy:   [facility] [place] [address] Phone: [phone] Fax: [phone]     Social Determinants of Health (SDOH) Interventions    Readmission Risk Interventions     No data to display

## 2021-10-16 NOTE — Progress Notes (Signed)
Patient ID: [name], female   DOB: April 06, 1966, 56 y.o.   MRN: [id number]   Subjective: 1 Day Post-Op Procedure(s) (LRB): OPEN REDUCTION INTERNAL FIXATION (ORIF) RIGHT LATERAL TIBIAL PLATEAU FRACTURE, ALLOGRAFT BONE CHIPS (Right) Patient reports pain as mild.    Objective: Vital signs in last 24 hours: Temp:  [97.5 F (36.4 C)-98.4 F (36.9 C)] 97.5 F (36.4 C) (07/06 0733) Pulse Rate:  [60-89] 70 (07/06 0733) Resp:  [16-22] 18 (07/06 0733) BP: (113-149)/(64-94) 128/69 (07/06 0733) SpO2:  [96 %-100 %] 99 % (07/06 0733) Weight:  [99.8 kg-104.3 kg] 104.3 kg (07/05 1545)  Intake/Output from previous day: 07/05 0701 - 07/06 0700 In: 2841.8 [P.O.:480; I.V.:2261.8; IV Piggyback:100] Out: 290 [Urine:200; Drains:40; Blood:50] Intake/Output this shift: Total I/O In: 120 [P.O.:120] Out: 0   Recent Labs    10/15/21 1607 10/16/21 0055  HGB 12.7 11.5*   Recent Labs    10/15/21 1607 10/16/21 0055  WBC 7.7 7.3  RBC 4.88 4.41  HCT 39.8 35.4*  PLT 287 260   Recent Labs    10/15/21 1607 10/16/21 0055  NA 140 136  K 3.0* 2.9*  CL 101 98  CO2 27 25  BUN 15 17  CREATININE 1.09* 1.14*  GLUCOSE 92 228*  CALCIUM 9.5 9.2   No results for input(s): "LABPT", "INR" in the last 72 hours.  Neurologically intact DG Knee 1-2 Views Right  Result Date: 10/15/2021 CLINICAL DATA:  Intraoperative radiographs of open reduction internal fixation (ORIF) of the right tibial plateau. EXAM: RIGHT KNEE - 1-2 VIEW COMPARISON:  Knee radiographs dated 10/09/2021. FINDINGS: Intraoperative radiographs demonstrate ORIF with plate and screws of a lateral tibial plateau fracture. The fracture fragments are in near anatomic alignment. IMPRESSION: Intraoperative radiographs demonstrate ORIF of a tibial plateau fracture. Electronically Signed   By: Zerita Boers M.D.   On: 10/15/2021 19:40   DG C-Arm 1-60 Min-No Report  Result Date: 10/15/2021 Fluoroscopy was utilized by the requesting physician.  No  radiographic interpretation.    Assessment/Plan: 1 Day Post-Op Procedure(s) (LRB): OPEN REDUCTION INTERNAL FIXATION (ORIF) RIGHT LATERAL TIBIAL PLATEAU FRACTURE, ALLOGRAFT BONE CHIPS (Right) Up with therapy. Had crutches that are too short. PT to work with walker and crutches NWB right LE.    [name] 10/16/2021, 7:48 AM

## 2021-10-16 NOTE — Discharge Instructions (Signed)
Your dressing is waterproof if you have a shower chair you can sit and take a shower dry the leg off afterwards and then put it back in a knee immobilizer but nonweightbearing on right lower extremity.  Usual walker and your pain will be decreased if you use intermittent ice and keep your leg elevated is much as you can above heart level.  See Dr. Lorin Mercy in about a week either next Wednesday or the following Tuesday is fine.  Office number is [phone] if you do not have an appointment already.

## 2021-10-17 ENCOUNTER — Other Ambulatory Visit (HOSPITAL_COMMUNITY): Payer: Self-pay

## 2021-10-17 DIAGNOSIS — S82141A Displaced bicondylar fracture of right tibia, initial encounter for closed fracture: Secondary | ICD-10-CM | POA: Diagnosis not present

## 2021-10-21 ENCOUNTER — Telehealth: Payer: Self-pay | Admitting: Orthopaedic Surgery

## 2021-10-21 ENCOUNTER — Ambulatory Visit (INDEPENDENT_AMBULATORY_CARE_PROVIDER_SITE_OTHER): Payer: BC Managed Care – PPO | Admitting: Surgery

## 2021-10-21 ENCOUNTER — Ambulatory Visit: Payer: Self-pay

## 2021-10-21 DIAGNOSIS — S82141A Displaced bicondylar fracture of right tibia, initial encounter for closed fracture: Secondary | ICD-10-CM
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
## 2021-10-21 NOTE — Telephone Encounter (Signed)
Received $25.00 cash Medical records release form and disability paperwork from patient. Forwarding to [employer] today

## 2021-10-21 NOTE — Progress Notes (Signed)
56 year old black female who is 1 week out from ORIF right tibial plateau fracture returns.  States that she is doing well.  Has been very compliant with using her knee immobilizer and nonweightbearing restrictions.    Exam Pleasant female alert and oriented in no acute distress.  Wound looks good.  Staples intact.  No drainage or signs infection.  Calf is nontender.  Neurologically intact.  Plan Reviewed x-rays with patient today.  Hardware intact.  She will continue to be nonweightbearing and using her knee immobilizer as instructed.  Ice off-and-on as needed.  She will take aspirin 325 mg p.o. daily.  Follow-up in 1 week with Dr. Lorin Mercy for wound check and possible staple removal.  Advised patient that she can anticipate being out of work at least 6 to 12 weeks.
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
## 2021-10-22 NOTE — Telephone Encounter (Signed)
Called and worked in for wed @ 9:45am

## 2021-10-29 ENCOUNTER — Ambulatory Visit (INDEPENDENT_AMBULATORY_CARE_PROVIDER_SITE_OTHER): Payer: BC Managed Care – PPO | Admitting: Orthopaedic Surgery

## 2021-10-29 DIAGNOSIS — S82141D Displaced bicondylar fracture of right tibia, subsequent encounter for closed fracture with routine healing: Secondary | ICD-10-CM

## 2021-10-29 NOTE — Progress Notes (Signed)
Post-Op Visit Note   Patient: [name]           Date of Birth: [birth year]           MRN: [id number] Visit Date: 10/29/2021 PCP: Martinique, Betty G, MD   Assessment & Plan: Follow-up right tibial plateau fracture dye punch injury plated anatomic position postop.  Staples removed tincture benzoin Steri-Strips.  She can do a straight leg raise.  She can remove her knee immobilizer work on passive and active assistive knee range of motion and straight leg raising.  Still nonweightbearing for 4 weeks return 4 weeks repeat x-rays of her knee AP lateral knee and then we should be able to start some partial weightbearing.  Work note given for okay to work from home which she states her job is allowed.  Chief Complaint:  Chief Complaint  Patient presents with   Right Knee - Routine Post Op   Visit Diagnoses:  1. Closed fracture of right tibial plateau with routine healing, subsequent encounter     Plan: Return 4 weeks for recheck.  X-rays as above.  Follow-Up Instructions: Return in about 4 weeks (around 11/26/2021).   Orders:  No orders of the defined types were placed in this encounter.  No orders of the defined types were placed in this encounter.   Imaging: No results found.  PMFS History: Patient Active Problem List   Diagnosis Date Noted   Tibial plateau fracture, right 10/15/2021   Hot flash, menopausal 08/02/2020   Acute left-sided thoracic back pain 11/29/2019   Need for pneumococcal vaccination 11/29/2019   Need for shingles vaccine 11/29/2019   DDD (degenerative disc disease), thoracic 11/29/2019   Stage 3a chronic kidney disease ([place]) 08/11/2019   Primary osteoarthritis involving multiple joints 08/10/2019   Arthritis of carpometacarpal ([place]) joint of right thumb 08/10/2019   Degenerative arthritis of knee, bilateral 08/10/2019   Anemia due to acquired thiamine deficiency 05/18/2019   Deficiency anemia 05/12/2019   Vitamin D insufficiency 12/17/2017   S/P  laparoscopic sleeve gastrectomy 12/16/2017   Chronic venous insufficiency 05/10/2017   Venous stasis syndrome 10/27/2016   Morbid obesity ([place]) 08/11/2016   Snoring 12/05/2015   Type 2 diabetes mellitus with other specified complication ([place]) 91/63/8466   Hyperlipidemia associated with type 2 diabetes mellitus ([place]) 09/13/2014   Dermatitis, asteototic 09/05/2014   Alkaline phosphatase elevation 04/25/2013   Carpal tunnel syndrome 12/30/2012   Other screening mammogram 10/10/2012   Severe obesity (BMI >= 40) ([place]) 12/25/2010   Routine general medical examination at a health care facility 09/16/2010   Essential hypertension, benign 02/27/2009   Osteoarthritis 02/27/2009   Past Medical History:  Diagnosis Date   Anemia    Back pain    Clotting disorder ([place])    Diabetes mellitus without complication ([place])    Edema 04/25/2015   Headache(784.0)    Herniated disc    History of colonic polyps    Hypertension    Joint pain    Obesity    Osteoarthritis    TIA (transient ischemic attack) 2007   hx of   Vasculitis ([facility])    L leg - Polyarthritis   Venous insufficiency    Vitamin D deficiency     Family History  Problem Relation Age of Onset   Stroke Mother    Hypertension Mother    AAA (abdominal aortic aneurysm) Mother    Hypertension Father    Cancer Father        Prostate  Arthritis Other    Hypertension Other    Stroke Other    Colon cancer Neg Hx    Esophageal cancer Neg Hx    Rectal cancer Neg Hx    Stomach cancer Neg Hx     Past Surgical History:  Procedure Laterality Date   ABDOMINAL HYSTERECTOMY     LAPAROSCOPIC GASTRIC SLEEVE RESECTION N/A 08/11/2016   Procedure: LAPAROSCOPIC GASTRIC SLEEVE RESECTION, UPPER ENDOSCOPY;  Surgeon: Arta Bruce Kinsinger, MD;  Location: [facility];  Service: General;  Laterality: N/A;   ORIF TIBIA PLATEAU Right 10/15/2021   Procedure: OPEN REDUCTION INTERNAL FIXATION (ORIF) RIGHT LATERAL TIBIAL PLATEAU FRACTURE, ALLOGRAFT BONE CHIPS;   Surgeon: [name], MD;  Location: [facility];  Service: Orthopedics;  Laterality: Right;   Social History   Occupational History   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

## 2021-11-06 ENCOUNTER — Other Ambulatory Visit (HOSPITAL_COMMUNITY): Payer: Self-pay

## 2021-11-07 ENCOUNTER — Other Ambulatory Visit (HOSPITAL_COMMUNITY): Payer: Self-pay

## 2021-11-19 ENCOUNTER — Encounter (INDEPENDENT_AMBULATORY_CARE_PROVIDER_SITE_OTHER): Payer: Self-pay

## 2021-11-25 ENCOUNTER — Ambulatory Visit (INDEPENDENT_AMBULATORY_CARE_PROVIDER_SITE_OTHER): Payer: BC Managed Care – PPO

## 2021-11-25 ENCOUNTER — Ambulatory Visit (INDEPENDENT_AMBULATORY_CARE_PROVIDER_SITE_OTHER): Payer: BC Managed Care – PPO | Admitting: Orthopaedic Surgery

## 2021-11-25 ENCOUNTER — Encounter: Payer: Self-pay | Admitting: Orthopaedic Surgery

## 2021-11-25 VITALS — BP 120/85 | HR 80 | Ht 62.0 in | Wt 230.0 lb

## 2021-11-25 DIAGNOSIS — S82141D Displaced bicondylar fracture of right tibia, subsequent encounter for closed fracture with routine healing: Secondary | ICD-10-CM

## 2021-11-25 NOTE — Progress Notes (Unsigned)
Post-Op Visit Note   Patient: [name]           Date of Birth: 01/15/1966           MRN: [id number] Visit Date: 11/25/2021 PCP: [place], Betty G, MD   Assessment & Plan: Post-ORIF lateral tibial plateau fracture dye punch.  Cancellous bone graft plate screws look good incision looks good she will apply lotion.  She is not taking pain medication just occasionally muscle relaxant.  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  She will let us know when her mobility is progressed to the point where she wants to try work resumption.  Continue use of the walker 50% weightbearing recheck 1 month.  Patient states her job is looking at possibly having her work from home on the computer which is acceptable with me.  Work note supplied this states patient can work from home on the computer.  She will call let me know if she needs a note and we can fax it for her.  Chief Complaint:  Chief Complaint  Patient presents with   Right Knee - Routine Post Op, Follow-up    10/15/2021 ORIF right tibial plateau fracture   Visit Diagnoses:  1. Closed fracture of right tibial plateau with routine healing, subsequent encounter     Plan: 50% weightbearing.  Return in 1 month repeat x-rays 2 view right tibial plateau on return visit.  Follow-Up Instructions: No follow-ups on file.   Orders:  Orders Placed This Encounter  Procedures   XR Knee 1-2 Views Right   No orders of the defined types were placed in this encounter.   Imaging: XR Knee 1-2 Views Right  Result Date: 11/25/2021 K AP lateral right knee obtained and reviewed this shows lateral tibial plateau fracture with the graft in good position of reduction plate and screw fixation.  No subsidence. Impression: Satisfactory right lateral tibial plateau ORIF.   PMFS History: Patient Active Problem List    Diagnosis Date Noted   Tibial plateau fracture, right 10/15/2021   Hot flash, menopausal 08/02/2020   Acute left-sided thoracic back pain 11/29/2019   Need for pneumococcal vaccination 11/29/2019   Need for shingles vaccine 11/29/2019   DDD (degenerative disc disease), thoracic 11/29/2019   Stage 3a chronic kidney disease ([place]) 08/11/2019   Primary osteoarthritis involving multiple joints 08/10/2019   Arthritis of carpometacarpal ([place]) joint of right thumb 08/10/2019   Degenerative arthritis of knee, bilateral 08/10/2019   Anemia due to acquired thiamine deficiency 05/18/2019   Deficiency anemia 05/12/2019   Vitamin D insufficiency 12/17/2017   S/P laparoscopic sleeve gastrectomy 12/16/2017   Chronic venous insufficiency 05/10/2017   Venous stasis syndrome 10/27/2016   Morbid obesity ([place]) 08/11/2016   Snoring 12/05/2015   Type 2 diabetes mellitus with other specified complication ([place]) 40/98/1191   Hyperlipidemia associated with type 2 diabetes mellitus ([place]) 09/13/2014   Dermatitis, asteototic 09/05/2014   Alkaline phosphatase elevation 04/25/2013   Carpal tunnel syndrome 12/30/2012   Other screening mammogram 10/10/2012   Severe obesity (BMI >= 40) ([place]) 12/25/2010   Routine general medical examination at a health care facility 09/16/2010   Essential hypertension, benign 02/27/2009   Osteoarthritis 02/27/2009   Past Medical History:  Diagnosis Date   Anemia    Back pain    Clotting disorder ([place])  Diabetes mellitus without complication ([place])    Edema 04/25/2015   Headache(784.0)    Herniated disc    History of colonic polyps    Hypertension    Joint pain    Obesity    Osteoarthritis    TIA (transient ischemic attack) 2007   hx of   Vasculitis ([place])    L leg - Polyarthritis   Venous insufficiency    Vitamin D deficiency     Family History  Problem Relation Age of Onset   Stroke Mother    Hypertension Mother    AAA (abdominal aortic aneurysm) Mother     Hypertension Father    Cancer Father        Prostate   Arthritis Other    Hypertension Other    Stroke Other    Colon cancer Neg Hx    Esophageal cancer Neg Hx    Rectal cancer Neg Hx    Stomach cancer Neg Hx     Past Surgical History:  Procedure Laterality Date   ABDOMINAL HYSTERECTOMY     LAPAROSCOPIC GASTRIC SLEEVE RESECTION N/A 08/11/2016   Procedure: LAPAROSCOPIC GASTRIC SLEEVE RESECTION, UPPER ENDOSCOPY;  Surgeon: Arta Bruce Kinsinger, MD;  [facility];  Service: General;  Laterality: N/A;   ORIF TIBIA PLATEAU Right 10/15/2021   Procedure: OPEN REDUCTION INTERNAL FIXATION (ORIF) RIGHT LATERAL TIBIAL PLATEAU FRACTURE, ALLOGRAFT BONE CHIPS;  Surgeon: [name], MD;  Location: [facility];  Service: Orthopedics;  Laterality: Right;   Social History   Occupational History   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

## 2021-12-01 ENCOUNTER — Encounter: Payer: Self-pay | Admitting: Physical Therapy

## 2021-12-01 ENCOUNTER — Ambulatory Visit (INDEPENDENT_AMBULATORY_CARE_PROVIDER_SITE_OTHER): Payer: BC Managed Care – PPO | Admitting: Physical Therapy

## 2021-12-01 DIAGNOSIS — R262 Difficulty in walking, not elsewhere classified: Secondary | ICD-10-CM

## 2021-12-01 DIAGNOSIS — M6281 Muscle weakness (generalized): Secondary | ICD-10-CM | POA: Diagnosis not present

## 2021-12-01 DIAGNOSIS — M79661 Pain in right lower leg: Secondary | ICD-10-CM | POA: Diagnosis not present
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
## 2021-12-02 ENCOUNTER — Other Ambulatory Visit: Payer: Self-pay | Admitting: Family Medicine

## 2021-12-02 ENCOUNTER — Other Ambulatory Visit (HOSPITAL_COMMUNITY): Payer: Self-pay

## 2021-12-02 MED ORDER — OZEMPIC (1 MG/DOSE) 4 MG/3ML ~~LOC~~ SOPN
1.0000 mg | PEN_INJECTOR | SUBCUTANEOUS | 2 refills | Status: DC
  Filled 2021-12-02: qty 3, 28d supply, fill #0
  Filled 2022-02-10: qty 3, 28d supply, fill #1
  Filled 2022-03-06: qty 3, 28d supply, fill #2

## 2021-12-03 ENCOUNTER — Other Ambulatory Visit (HOSPITAL_COMMUNITY): Payer: Self-pay

## 2021-12-03 NOTE — Progress Notes (Deleted)
HPI: [name] is a 56 y.o. female, who is here today for chronic disease management.  Last seen on ***  *** Review of Systems Rest of ROS see pertinent positives and negatives in HPI.  Current Outpatient Medications on File Prior to Visit  Medication Sig Dispense Refill  . acetaminophen (TYLENOL) 500 MG tablet Take 1,000 mg by mouth every 8 (eight) hours.    . carvedilol (COREG) 3.125 MG tablet Take 1 tablet (3.125 mg total) by mouth 2 (two) times daily with a meal. 180 tablet 2  . COVID-19 mRNA bivalent vaccine, Pfizer, (PFIZER COVID-19 VAC BIVALENT) injection Inject into the muscle. 0.3 mL 0  . indapamide (LOZOL) 1.25 MG tablet Take 1 tablet (1.25 mg total) by mouth daily. 90 tablet 2  . meclizine (ANTIVERT) 25 MG tablet Take 1 tablet by mouth 3 times daily as needed for dizziness. 30 tablet 0  . oxyCODONE-acetaminophen (PERCOCET) 5-325 MG tablet Take 1-2 tablets by mouth every 6 (six) hours as needed for severe pain. (Patient not taking: Reported on 11/25/2021) 40 tablet 0  . pravastatin (PRAVACHOL) 20 MG tablet Take 1 tablet (20 mg total) by mouth daily. 90 tablet 3  . Semaglutide, 1 MG/DOSE, (OZEMPIC, 1 MG/DOSE,) 4 MG/3ML SOPN Inject 1 mg as directed once a week. 3 mL 2  . tiZANidine (ZANAFLEX) 2 MG tablet Take 1 tablet (2 mg total) by mouth every 8 (eight) hours as needed for muscle spasms. 90 tablet 1   No current facility-administered medications on file prior to visit.    Past Medical History:  Diagnosis Date  . Anemia   . Back pain   . Clotting disorder ([place])   . Diabetes mellitus without complication ([place])   . Edema 04/25/2015  . Headache(784.0)   . Herniated disc   . History of colonic polyps   . Hypertension   . Joint pain   . Obesity   . Osteoarthritis   . TIA (transient ischemic attack) 2007   hx of  . Vasculitis ([facility])    L leg - Polyarthritis  . Venous insufficiency   . Vitamin D deficiency    No Known Allergies  Social History    Socioeconomic History  . Marital status: Married    Spouse name: [name]  . Number of children: Not on file  . Years of education: Not on file  . Highest education level: Not on file  Occupational History  . 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 Smoking status: Never  . Smokeless tobacco: Never  Vaping Use  . Vaping Use: Never used  Substance and Sexual Activity  . Alcohol use: Yes    Alcohol/week: 0.0 standard drinks of alcohol    Comment: rarely  . Drug use: No  . Sexual activity: Yes    Birth control/protection: Surgical  Other Topics Concern  . Not on file  Social History Narrative  . Not on file   Social Determinants of Health   Financial Resource Strain: Not on file  Food Insecurity: Not on file  Transportation Needs: Not on file  Physical Activity: Not on file  Stress: Not on file  Social Connections: Not on file    There were no vitals filed for this visit. There is no height or weight on file to calculate BMI.  Physical Exam  ASSESSMENT AND PLAN:  There are no diagnoses linked to this encounter.  No orders of the defined types were placed in this encounter.   No  problem-specific Assessment & Plan notes found for this encounter.   No follow-ups on file.  Betty G. [place]ique, MD  [facility] [place] office.

## 2021-12-05 ENCOUNTER — Ambulatory Visit (INDEPENDENT_AMBULATORY_CARE_PROVIDER_SITE_OTHER): Payer: BC Managed Care – PPO | Admitting: Physical Therapy

## 2021-12-05 ENCOUNTER — Ambulatory Visit: Payer: BC Managed Care – PPO | Admitting: Family Medicine

## 2021-12-05 ENCOUNTER — Encounter: Payer: Self-pay | Admitting: Physical Therapy

## 2021-12-05 DIAGNOSIS — M6281 Muscle weakness (generalized): Secondary | ICD-10-CM

## 2021-12-05 DIAGNOSIS — M79661 Pain in right lower leg: Secondary | ICD-10-CM | POA: Diagnosis not present

## 2021-12-05 DIAGNOSIS — R262 Difficulty in walking, not elsewhere classified: Secondary | ICD-10-CM | POA: Diagnosis not present
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
## 2021-12-08 ENCOUNTER — Encounter: Payer: Self-pay | Admitting: Physical Therapy

## 2021-12-08 ENCOUNTER — Ambulatory Visit (INDEPENDENT_AMBULATORY_CARE_PROVIDER_SITE_OTHER): Payer: BC Managed Care – PPO | Admitting: Physical Therapy

## 2021-12-08 DIAGNOSIS — R262 Difficulty in walking, not elsewhere classified: Secondary | ICD-10-CM | POA: Diagnosis not present

## 2021-12-08 DIAGNOSIS — M79661 Pain in right lower leg: Secondary | ICD-10-CM | POA: Diagnosis not present

## 2021-12-08 DIAGNOSIS — M6281 Muscle weakness (generalized): Secondary | ICD-10-CM | POA: Diagnosis not present
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
## 2021-12-10 ENCOUNTER — Ambulatory Visit (INDEPENDENT_AMBULATORY_CARE_PROVIDER_SITE_OTHER): Payer: BC Managed Care – PPO | Admitting: Physical Therapy

## 2021-12-10 ENCOUNTER — Encounter: Payer: Self-pay | Admitting: Physical Therapy

## 2021-12-10 DIAGNOSIS — M6281 Muscle weakness (generalized): Secondary | ICD-10-CM | POA: Diagnosis not present

## 2021-12-10 DIAGNOSIS — R262 Difficulty in walking, not elsewhere classified: Secondary | ICD-10-CM | POA: Diagnosis not present

## 2021-12-10 DIAGNOSIS — M79661 Pain in right lower leg: Secondary | ICD-10-CM
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
## 2021-12-16 ENCOUNTER — Ambulatory Visit (INDEPENDENT_AMBULATORY_CARE_PROVIDER_SITE_OTHER): Payer: BC Managed Care – PPO | Admitting: Physical Therapy

## 2021-12-16 ENCOUNTER — Encounter: Payer: BC Managed Care – PPO | Admitting: Physical Therapy

## 2021-12-16 ENCOUNTER — Encounter: Payer: Self-pay | Admitting: Physical Therapy

## 2021-12-16 DIAGNOSIS — M6281 Muscle weakness (generalized): Secondary | ICD-10-CM | POA: Diagnosis not present

## 2021-12-16 DIAGNOSIS — M79661 Pain in right lower leg: Secondary | ICD-10-CM | POA: Diagnosis not present

## 2021-12-16 DIAGNOSIS — R262 Difficulty in walking, not elsewhere classified: Secondary | ICD-10-CM
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
## 2021-12-19 ENCOUNTER — Encounter: Payer: BC Managed Care – PPO | Admitting: Physical Therapy

## 2021-12-19 ENCOUNTER — Encounter: Payer: BC Managed Care – PPO | Admitting: Rehabilitative and Restorative Service Providers"

## 2021-12-22 ENCOUNTER — Encounter: Payer: Self-pay | Admitting: Physical Therapy

## 2021-12-22 ENCOUNTER — Ambulatory Visit (INDEPENDENT_AMBULATORY_CARE_PROVIDER_SITE_OTHER): Payer: BC Managed Care – PPO | Admitting: Physical Therapy

## 2021-12-22 DIAGNOSIS — M6281 Muscle weakness (generalized): Secondary | ICD-10-CM

## 2021-12-22 DIAGNOSIS — R262 Difficulty in walking, not elsewhere classified: Secondary | ICD-10-CM | POA: Diagnosis not present

## 2021-12-22 DIAGNOSIS — M79661 Pain in right lower leg: Secondary | ICD-10-CM | POA: Diagnosis not present
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
## 2021-12-24 ENCOUNTER — Encounter: Payer: BC Managed Care – PPO | Admitting: Physical Therapy

## 2021-12-26 ENCOUNTER — Encounter: Payer: BC Managed Care – PPO | Admitting: Rehabilitative and Restorative Service Providers"

## 2021-12-26 ENCOUNTER — Ambulatory Visit (INDEPENDENT_AMBULATORY_CARE_PROVIDER_SITE_OTHER): Payer: BC Managed Care – PPO

## 2021-12-26 ENCOUNTER — Ambulatory Visit (INDEPENDENT_AMBULATORY_CARE_PROVIDER_SITE_OTHER): Payer: BC Managed Care – PPO | Admitting: Orthopaedic Surgery

## 2021-12-26 ENCOUNTER — Encounter: Payer: Self-pay | Admitting: Orthopaedic Surgery

## 2021-12-26 VITALS — BP 105/70 | HR 80 | Ht 62.0 in | Wt 230.0 lb

## 2021-12-26 DIAGNOSIS — S82141D Displaced bicondylar fracture of right tibia, subsequent encounter for closed fracture with routine healing: Secondary | ICD-10-CM | POA: Diagnosis not present

## 2021-12-26 NOTE — Progress Notes (Signed)
Post-Op Visit Note   Patient: [name]           Date of Birth: 06-20-1965           MRN: [id number] Visit Date: 12/26/2021 PCP: Martinique, Betty G, MD   Assessment & Plan: Follow-up ORIF tibial plateau.  X-rays look good.  She has good flexion full extension she is ambulatory with a walker and has been doing 50% will progress to the cane and she can progress to weightbearing as tolerated with a cane.  Work slip given for work resumption 01/05/2022.  To use her cane when she goes to work.  I will recheck her in 6 weeks no x-ray needed on return.  Incision looks good swelling is down. She works as a Materials engineer. Chief Complaint:  Chief Complaint  Patient presents with   Right Knee - Follow-up, Routine Post Op    10/15/2021 ORIF right tibial plateau fracture   Visit Diagnoses:  1. Closed fracture of right tibial plateau with routine healing, subsequent encounter     Plan: ROV 6 wks  Follow-Up Instructions: Return in about 6 weeks (around 02/06/2022).   Orders:  Orders Placed This Encounter  Procedures   XR Knee 1-2 Views Right   No orders of the defined types were placed in this encounter.   Imaging: No results found.  PMFS History: Patient Active Problem List   Diagnosis Date Noted   Tibial plateau fracture, right 10/15/2021   Hot flash, menopausal 08/02/2020   Acute left-sided thoracic back pain 11/29/2019   Need for pneumococcal vaccination 11/29/2019   Need for shingles vaccine 11/29/2019   DDD (degenerative disc disease), thoracic 11/29/2019   Stage 3a chronic kidney disease ([place]) 08/11/2019   Primary osteoarthritis involving multiple joints 08/10/2019   Arthritis of carpometacarpal ([place]) joint of right thumb 08/10/2019   Degenerative arthritis of knee, bilateral 08/10/2019   Anemia due to acquired thiamine deficiency 05/18/2019   Deficiency anemia 05/12/2019   Vitamin D insufficiency 12/17/2017   S/P laparoscopic sleeve gastrectomy 12/16/2017    Chronic venous insufficiency 05/10/2017   Venous stasis syndrome 10/27/2016   Morbid obesity ([place]) 08/11/2016   Snoring 12/05/2015   Type 2 diabetes mellitus with other specified complication ([place]) 40/81/4481   Hyperlipidemia associated with type 2 diabetes mellitus ([place]) 09/13/2014   Dermatitis, asteototic 09/05/2014   Alkaline phosphatase elevation 04/25/2013   Carpal tunnel syndrome 12/30/2012   Other screening mammogram 10/10/2012   Severe obesity (BMI >= 40) ([place]) 12/25/2010   Routine general medical examination at a health care facility 09/16/2010   Essential hypertension, benign 02/27/2009   Osteoarthritis 02/27/2009   Past Medical History:  Diagnosis Date   Anemia    Back pain    Clotting disorder ([place])    Diabetes mellitus without complication ([place])    Edema 04/25/2015   Headache(784.0)    Herniated disc    History of colonic polyps    Hypertension    Joint pain    Obesity    Osteoarthritis    TIA (transient ischemic attack) 2007   hx of   Vasculitis (HCC)    L leg - Polyarthritis   Venous insufficiency    Vitamin D deficiency     Family History  Problem Relation Age of Onset   Stroke Mother    Hypertension Mother    AAA (abdominal aortic aneurysm) Mother    Hypertension Father    Cancer Father        Prostate  Arthritis Other    Hypertension Other    Stroke Other    Colon cancer Neg Hx    Esophageal cancer Neg Hx    Rectal cancer Neg Hx    Stomach cancer Neg Hx     Past Surgical History:  Procedure Laterality Date   ABDOMINAL HYSTERECTOMY     LAPAROSCOPIC GASTRIC SLEEVE RESECTION N/A 08/11/2016   Procedure: LAPAROSCOPIC GASTRIC SLEEVE RESECTION, UPPER ENDOSCOPY;  Surgeon: Arta Bruce Kinsinger, MD;  Location: [facility];  Service: General;  Laterality: N/A;   ORIF TIBIA PLATEAU Right 10/15/2021   Procedure: OPEN REDUCTION INTERNAL FIXATION (ORIF) RIGHT LATERAL TIBIAL PLATEAU FRACTURE, ALLOGRAFT BONE CHIPS;  Surgeon: [name], MD;  Location: [facility];   Service: Orthopedics;  Laterality: Right;   Social History   Occupational History   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

## 2021-12-29 ENCOUNTER — Ambulatory Visit (INDEPENDENT_AMBULATORY_CARE_PROVIDER_SITE_OTHER): Payer: BC Managed Care – PPO | Admitting: Physical Therapy

## 2021-12-29 ENCOUNTER — Encounter: Payer: BC Managed Care – PPO | Admitting: Physical Therapy

## 2021-12-29 ENCOUNTER — Encounter: Payer: Self-pay | Admitting: Physical Therapy

## 2021-12-29 DIAGNOSIS — M79661 Pain in right lower leg: Secondary | ICD-10-CM | POA: Diagnosis not present

## 2021-12-29 DIAGNOSIS — M6281 Muscle weakness (generalized): Secondary | ICD-10-CM

## 2021-12-29 DIAGNOSIS — R262 Difficulty in walking, not elsewhere classified: Secondary | ICD-10-CM
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
## 2021-12-31 ENCOUNTER — Encounter: Payer: BC Managed Care – PPO | Admitting: Physical Therapy

## 2022-01-01 ENCOUNTER — Encounter: Payer: BC Managed Care – PPO | Admitting: Physical Therapy

## 2022-01-02 ENCOUNTER — Encounter: Payer: BC Managed Care – PPO | Admitting: Physical Therapy

## 2022-01-05 ENCOUNTER — Encounter: Payer: BC Managed Care – PPO | Admitting: Physical Therapy

## 2022-01-05 ENCOUNTER — Encounter: Payer: Self-pay | Admitting: Physical Therapy

## 2022-01-05 ENCOUNTER — Ambulatory Visit (INDEPENDENT_AMBULATORY_CARE_PROVIDER_SITE_OTHER): Payer: BC Managed Care – PPO | Admitting: Physical Therapy

## 2022-01-05 DIAGNOSIS — R262 Difficulty in walking, not elsewhere classified: Secondary | ICD-10-CM | POA: Diagnosis not present

## 2022-01-05 DIAGNOSIS — M6281 Muscle weakness (generalized): Secondary | ICD-10-CM

## 2022-01-05 DIAGNOSIS — M79661 Pain in right lower leg: Secondary | ICD-10-CM | POA: Diagnosis not present
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
## 2022-01-09 ENCOUNTER — Encounter: Payer: BC Managed Care – PPO | Admitting: Rehabilitative and Restorative Service Providers"

## 2022-01-12 ENCOUNTER — Ambulatory Visit (INDEPENDENT_AMBULATORY_CARE_PROVIDER_SITE_OTHER): Payer: BC Managed Care – PPO | Admitting: Physical Therapy

## 2022-01-12 ENCOUNTER — Encounter: Payer: Self-pay | Admitting: Physical Therapy

## 2022-01-12 DIAGNOSIS — M6281 Muscle weakness (generalized): Secondary | ICD-10-CM

## 2022-01-12 DIAGNOSIS — M79661 Pain in right lower leg: Secondary | ICD-10-CM | POA: Diagnosis not present

## 2022-01-12 DIAGNOSIS — R262 Difficulty in walking, not elsewhere classified: Secondary | ICD-10-CM | POA: Diagnosis not present
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
## 2022-01-16 ENCOUNTER — Telehealth: Payer: Self-pay | Admitting: Orthopaedic Surgery

## 2022-01-16 NOTE — Telephone Encounter (Signed)
I called patient and advised. 

## 2022-01-16 NOTE — Telephone Encounter (Signed)
I left voicemail for patient requesting return call if she needs to speak with me.

## 2022-01-16 NOTE — Telephone Encounter (Signed)
Pt called requesting a call from [facility] about right leg swelling. Asking to be call at [phone]

## 2022-01-16 NOTE — Telephone Encounter (Signed)
I spoke with patient. She states that on Wednesday, she started to notice some tingling running up and down her leg. She has had slight swelling but it has gone down with elevation. She does not have history of blood clot and is not taking any blood thinning medication. She could not come in for appointment today, ut can make it on Monday. I have scheduled her for an appointment with you on Monday.  Please advise if there is something else you would like for her to do before Monday.

## 2022-01-19 ENCOUNTER — Ambulatory Visit (INDEPENDENT_AMBULATORY_CARE_PROVIDER_SITE_OTHER): Payer: BC Managed Care – PPO | Admitting: Physical Therapy

## 2022-01-19 ENCOUNTER — Encounter: Payer: Self-pay | Admitting: Physical Therapy

## 2022-01-19 ENCOUNTER — Ambulatory Visit (INDEPENDENT_AMBULATORY_CARE_PROVIDER_SITE_OTHER): Payer: BC Managed Care – PPO | Admitting: Surgery

## 2022-01-19 ENCOUNTER — Ambulatory Visit: Payer: Self-pay

## 2022-01-19 ENCOUNTER — Encounter: Payer: Self-pay | Admitting: Surgery

## 2022-01-19 VITALS — BP 116/76 | HR 87 | Ht 62.0 in | Wt 230.0 lb

## 2022-01-19 DIAGNOSIS — R262 Difficulty in walking, not elsewhere classified: Secondary | ICD-10-CM | POA: Diagnosis not present

## 2022-01-19 DIAGNOSIS — M25571 Pain in right ankle and joints of right foot: Secondary | ICD-10-CM | POA: Diagnosis not present

## 2022-01-19 DIAGNOSIS — M6281 Muscle weakness (generalized): Secondary | ICD-10-CM | POA: Diagnosis not present

## 2022-01-19 DIAGNOSIS — M79661 Pain in right lower leg: Secondary | ICD-10-CM

## 2022-01-19 DIAGNOSIS — M542 Cervicalgia: Secondary | ICD-10-CM

## 2022-01-19 DIAGNOSIS — M19071 Primary osteoarthritis, right ankle and foot: Secondary | ICD-10-CM

## 2022-01-19 DIAGNOSIS — M2550 Pain in unspecified joint: Secondary | ICD-10-CM

## 2022-01-19 DIAGNOSIS — S82141D Displaced bicondylar fracture of right tibia, subsequent encounter for closed fracture with routine healing: Secondary | ICD-10-CM
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
## 2022-01-20 DIAGNOSIS — M25571 Pain in right ankle and joints of right foot: Secondary | ICD-10-CM | POA: Diagnosis not present

## 2022-01-20 DIAGNOSIS — M19071 Primary osteoarthritis, right ankle and foot: Secondary | ICD-10-CM

## 2022-01-20 LAB — CBC
HCT: 36.2 % (ref 35.0–45.0)
Hemoglobin: 12.1 g/dL (ref 11.7–15.5)
MCH: 26.9 pg — ABNORMAL LOW (ref 27.0–33.0)
MCHC: 33.4 g/dL (ref 32.0–36.0)
MCV: 80.6 fL (ref 80.0–100.0)
MPV: 10.8 fL (ref 7.5–12.5)
Platelets: 315 10*3/uL (ref 140–400)
RBC: 4.49 10*6/uL (ref 3.80–5.10)
RDW: 14.7 % (ref 11.0–15.0)
WBC: 6.4 10*3/uL (ref 3.8–10.8)

## 2022-01-20 LAB — SEDIMENTATION RATE: Sed Rate: 53 mm/h — ABNORMAL HIGH (ref 0–30)

## 2022-01-20 LAB — URIC ACID: Uric Acid, Serum: 8 mg/dL — ABNORMAL HIGH (ref 2.5–7.0)

## 2022-01-20 LAB — C-REACTIVE PROTEIN: CRP: 29.8 mg/L — ABNORMAL HIGH (ref <8.0)

## 2022-01-20 LAB — RHEUMATOID FACTOR: Rheumatoid fact SerPl-aCnc: <14 IU/mL (ref <14)

## 2022-01-20 LAB — ANA: Anti Nuclear Antibody (ANA): NEGATIVE

## 2022-01-20 MED ORDER — LIDOCAINE HCL 1 % IJ SOLN
1.0000 mL | INTRAMUSCULAR | Status: AC | PRN
  Administered 2022-01-20: 1 mL

## 2022-01-20 MED ORDER — BUPIVACAINE HCL 0.5 % IJ SOLN
3.0000 mL | INTRAMUSCULAR | Status: AC | PRN
  Administered 2022-01-20: 3 mL via INTRA_ARTICULAR

## 2022-01-20 MED ORDER — METHYLPREDNISOLONE ACETATE 40 MG/ML IJ SUSP
40.0000 mg | INTRAMUSCULAR | Status: AC | PRN
  Administered 2022-01-20: 40 mg via INTRA_ARTICULAR

## 2022-01-26 ENCOUNTER — Ambulatory Visit (INDEPENDENT_AMBULATORY_CARE_PROVIDER_SITE_OTHER): Payer: BC Managed Care – PPO | Admitting: Surgery

## 2022-01-26 ENCOUNTER — Encounter: Payer: Self-pay | Admitting: Physical Therapy

## 2022-01-26 ENCOUNTER — Encounter: Payer: Self-pay | Admitting: Surgery

## 2022-01-26 ENCOUNTER — Ambulatory Visit (INDEPENDENT_AMBULATORY_CARE_PROVIDER_SITE_OTHER): Payer: BC Managed Care – PPO | Admitting: Physical Therapy

## 2022-01-26 VITALS — BP 108/75 | HR 73 | Ht 62.0 in | Wt 230.0 lb

## 2022-01-26 DIAGNOSIS — S82141A Displaced bicondylar fracture of right tibia, initial encounter for closed fracture: Secondary | ICD-10-CM

## 2022-01-26 DIAGNOSIS — M6281 Muscle weakness (generalized): Secondary | ICD-10-CM

## 2022-01-26 DIAGNOSIS — M542 Cervicalgia: Secondary | ICD-10-CM

## 2022-01-26 DIAGNOSIS — R262 Difficulty in walking, not elsewhere classified: Secondary | ICD-10-CM

## 2022-01-26 DIAGNOSIS — M1A0720 Idiopathic chronic gout, left ankle and foot, without tophus (tophi): Secondary | ICD-10-CM

## 2022-01-26 DIAGNOSIS — M79661 Pain in right lower leg: Secondary | ICD-10-CM | POA: Diagnosis not present

## 2022-01-26 NOTE — Progress Notes (Signed)
56 year old black female returns for recheck of her left ankle pain and right knee.  States that she had excellent relief with previous left ankle intra-articular Marcaine/Depo-Medrol injection.  Blood work drawn at last office visit showed elevated uric acid 8.0, elevated CRP 29.8 and elevated sed rate 53.  States that she is wanting to return back to work.    Plan Patient will be scheduling an upcoming appointment with her primary care physician Dr. Betty Martinique.  Dr. Martinique can go over the labs that I had drawn and I advised patient that she may need to be on allopurinol.  PCP can start this and needs to have monitoring of kidney function.  I recommend that she do light duty 4-hour days until she sees Dr. Lorin Mercy October 27 and note was given.  Continue PT.
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
## 2022-02-02 ENCOUNTER — Encounter: Payer: Self-pay | Admitting: Physical Therapy

## 2022-02-02 ENCOUNTER — Ambulatory Visit (INDEPENDENT_AMBULATORY_CARE_PROVIDER_SITE_OTHER): Payer: BC Managed Care – PPO | Admitting: Physical Therapy

## 2022-02-02 DIAGNOSIS — R262 Difficulty in walking, not elsewhere classified: Secondary | ICD-10-CM

## 2022-02-02 DIAGNOSIS — M79661 Pain in right lower leg: Secondary | ICD-10-CM | POA: Diagnosis not present

## 2022-02-02 DIAGNOSIS — M6281 Muscle weakness (generalized): Secondary | ICD-10-CM
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
## 2022-02-06 ENCOUNTER — Encounter: Payer: Self-pay | Admitting: Orthopaedic Surgery

## 2022-02-06 ENCOUNTER — Ambulatory Visit (INDEPENDENT_AMBULATORY_CARE_PROVIDER_SITE_OTHER): Payer: BC Managed Care – PPO | Admitting: Orthopaedic Surgery

## 2022-02-06 VITALS — BP 106/69 | HR 63 | Ht 62.0 in | Wt 230.0 lb

## 2022-02-06 DIAGNOSIS — S82141D Displaced bicondylar fracture of right tibia, subsequent encounter for closed fracture with routine healing: Secondary | ICD-10-CM | POA: Diagnosis not present

## 2022-02-06 NOTE — Progress Notes (Signed)
Office Visit Note   Patient: [name]           Date of Birth: [birth year]           MRN: [id number] Visit Date: 02/06/2022              Requested by: Martinique, Betty G, MD [address] [place],  [place] [address] PCP: Martinique, Betty G, MD   Assessment & Plan: Visit Diagnoses:  1. Closed fracture of right tibial plateau with routine healing, subsequent encounter   2.     Hyperuricemia with likely ankle gout.   Plan: We discussed elevated uric acid single episode of joint problems that would suggest gout.  We will see how she does when she sees Dr. Betty Martinique she could have her uric acid checked again in several months.  If she has persistent hyperuricemia then she can be started on an anti-inflammatory first and then gradually ramp up allopurinol starting at 100 mg for the first week then to 200 mg second week and then 300 mg daily.  I discussed with her with just a single episode of likely gout I would not recommend chronic long-term allopurinol needing to be started at this point.  Work slip given for resuming regular work on Monday.  She can follow-up with me as needed.  Follow-Up Instructions: No follow-ups on file.   Orders:  No orders of the defined types were placed in this encounter.  No orders of the defined types were placed in this encounter.     Procedures: No procedures performed   Clinical Data: No additional findings.   Subjective: Chief Complaint  Patient presents with   Right Knee - Follow-up    10/15/2021 ORIF right lateral tibial plateau fracture    HPI patient is almost 4 months out from ORIF lateral tibial plateau.  She was seen by Benjiman Core, PA-C with synovitis of her ankle swelling and hyperuricemia with uric acid of 8.  No past history of joint problems that would suggest gout although she has complained of some knee arthritis arthritis the base of the thumb in the past.  She states since ankle injection her ankle has been doing  great.  Review of Systems all the systems noncontributory to HPI.   Objective: Vital Signs: BP 106/69   Pulse 63   Ht '5\' 2"'$  (1.575 m)   Wt 230 lb (104.3 kg)   BMI 42.07 kg/m   Physical Exam Constitutional:      Appearance: She is well-developed.  HENT:     Head: Normocephalic.     Right Ear: External ear normal.     Left Ear: External ear normal. There is no impacted cerumen.  Eyes:     Pupils: Pupils are equal, round, and reactive to light.  Neck:     Thyroid: No thyromegaly.     Trachea: No tracheal deviation.  Cardiovascular:     Rate and Rhythm: Normal rate.  Pulmonary:     Effort: Pulmonary effort is normal.  Abdominal:     Palpations: Abdomen is soft.  Musculoskeletal:     Cervical back: No rigidity.  Skin:    General: Skin is warm and dry.  Neurological:     Mental Status: She is alert and oriented to person, place, and time.  Psychiatric:        Behavior: Behavior normal.     Ortho Exam full ankle range of motion.  Well-healed lateral tibial plateau incision right leg.  Good knee range of motion no effusion of the right knee.  She is amatory with normal gait without limping.  Specialty Comments:  No specialty comments available.  Imaging: No results found.   PMFS History: Patient Active Problem List   Diagnosis Date Noted   Tibial plateau fracture, right 10/15/2021   Hot flash, menopausal 08/02/2020   Acute left-sided thoracic back pain 11/29/2019   Need for pneumococcal vaccination 11/29/2019   Need for shingles vaccine 11/29/2019   DDD (degenerative disc disease), thoracic 11/29/2019   Stage 3a chronic kidney disease ([place]) 08/11/2019   Primary osteoarthritis involving multiple joints 08/10/2019   Arthritis of carpometacarpal ([place]) joint of right thumb 08/10/2019   Degenerative arthritis of knee, bilateral 08/10/2019   Anemia due to acquired thiamine deficiency 05/18/2019   Deficiency anemia 05/12/2019   Vitamin D insufficiency 12/17/2017    S/P laparoscopic sleeve gastrectomy 12/16/2017   Chronic venous insufficiency 05/10/2017   Venous stasis syndrome 10/27/2016   Morbid obesity ([place]) 08/11/2016   Snoring 12/05/2015   Type 2 diabetes mellitus with other specified complication ([place]) 37/62/8315   Hyperlipidemia associated with type 2 diabetes mellitus ([place]) 09/13/2014   Dermatitis, asteototic 09/05/2014   Alkaline phosphatase elevation 04/25/2013   Carpal tunnel syndrome 12/30/2012   Other screening mammogram 10/10/2012   Severe obesity (BMI >= 40) ([place]) 12/25/2010   Routine general medical examination at a health care facility 09/16/2010   Essential hypertension, benign 02/27/2009   Osteoarthritis 02/27/2009   Past Medical History:  Diagnosis Date   Anemia    Back pain    Clotting disorder ([place])    Diabetes mellitus without complication ([place])    Edema 04/25/2015   Headache(784.0)    Herniated disc    History of colonic polyps    Hypertension    Joint pain    Obesity    Osteoarthritis    TIA (transient ischemic attack) 2007   hx of   Vasculitis (H[facility])    L leg - Polyarthritis   Venous insufficiency    Vitamin D deficiency     Family History  Problem Relation Age of Onset   Stroke Mother    Hypertension Mother    AAA (abdominal aortic aneurysm) Mother    Hypertension Father    Cancer Father        Prostate   Arthritis Other    Hypertension Other    Stroke Other    Colon cancer Neg Hx    Esophageal cancer Neg Hx    Rectal cancer Neg Hx    Stomach cancer Neg Hx     Past Surgical History:  Procedure Laterality Date   ABDOMINAL HYSTERECTOMY     LAPAROSCOPIC GASTRIC SLEEVE RESECTION N/A 08/11/2016   Procedure: LAPAROSCOPIC GASTRIC SLEEVE RESECTION, UPPER ENDOSCOPY;  Surgeon: Arta Bruce Kinsinger, MD;  Location: [facility];  Service: General;  Laterality: N/A;   ORIF TIBIA PLATEAU Right 10/15/2021   Procedure: OPEN REDUCTION INTERNAL FIXATION (ORIF) RIGHT LATERAL TIBIAL PLATEAU FRACTURE, ALLOGRAFT BONE CHIPS;   Surgeon: [name], MD;  Location: [facility];  Service: Orthopedics;  Laterality: Right;   Social History   Occupational History   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

## 2022-02-09 ENCOUNTER — Encounter: Payer: Self-pay | Admitting: Physical Therapy

## 2022-02-09 ENCOUNTER — Ambulatory Visit (INDEPENDENT_AMBULATORY_CARE_PROVIDER_SITE_OTHER): Payer: BC Managed Care – PPO | Admitting: Physical Therapy

## 2022-02-09 DIAGNOSIS — M79661 Pain in right lower leg: Secondary | ICD-10-CM

## 2022-02-09 DIAGNOSIS — R262 Difficulty in walking, not elsewhere classified: Secondary | ICD-10-CM

## 2022-02-09 DIAGNOSIS — M6281 Muscle weakness (generalized): Secondary | ICD-10-CM | POA: Diagnosis not present
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
## 2022-02-10 ENCOUNTER — Other Ambulatory Visit: Payer: Self-pay | Admitting: Family Medicine

## 2022-02-10 ENCOUNTER — Other Ambulatory Visit (HOSPITAL_COMMUNITY): Payer: Self-pay

## 2022-02-10 DIAGNOSIS — I10 Essential (primary) hypertension: Secondary | ICD-10-CM

## 2022-02-11 ENCOUNTER — Other Ambulatory Visit (HOSPITAL_COMMUNITY): Payer: Self-pay

## 2022-02-11 MED ORDER — CARVEDILOL 3.125 MG PO TABS
3.1250 mg | ORAL_TABLET | Freq: Two times a day (BID) | ORAL | 2 refills | Status: DC
  Filled 2022-02-11: qty 180, 90d supply, fill #0
  Filled 2022-05-01: qty 180, 90d supply, fill #1
  Filled 2022-08-11: qty 180, 90d supply, fill #2

## 2022-02-11 MED ORDER — INDAPAMIDE 1.25 MG PO TABS
1.2500 mg | ORAL_TABLET | Freq: Every day | ORAL | 2 refills | Status: DC
  Filled 2022-02-11: qty 90, 90d supply, fill #0
  Filled 2022-05-01: qty 90, 90d supply, fill #1
  Filled 2022-08-11: qty 90, 90d supply, fill #2

## 2022-02-16 ENCOUNTER — Encounter: Payer: Self-pay | Admitting: Rehabilitative and Restorative Service Providers"

## 2022-02-16 ENCOUNTER — Ambulatory Visit (INDEPENDENT_AMBULATORY_CARE_PROVIDER_SITE_OTHER): Payer: BC Managed Care – PPO | Admitting: Rehabilitative and Restorative Service Providers"

## 2022-02-16 DIAGNOSIS — M79661 Pain in right lower leg: Secondary | ICD-10-CM

## 2022-02-16 DIAGNOSIS — M6281 Muscle weakness (generalized): Secondary | ICD-10-CM

## 2022-02-16 DIAGNOSIS — R262 Difficulty in walking, not elsewhere classified: Secondary | ICD-10-CM
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
## 2022-02-20 NOTE — Progress Notes (Deleted)
HPI:  [name] is a 56 y.o. female, who is here today to follow on recent visit.  Review of Systems Rest see pertinent positives and negatives per HPI.  Current Outpatient Medications on File Prior to Visit  Medication Sig Dispense Refill   acetaminophen (TYLENOL) 500 MG tablet Take 1,000 mg by mouth every 8 (eight) hours.     carvedilol (COREG) 3.125 MG tablet Take 1 tablet (3.125 mg total) by mouth 2 (two) times daily with a meal. 180 tablet 2   COVID-19 mRNA bivalent vaccine, Pfizer, (PFIZER COVID-19 VAC BIVALENT) injection Inject into the muscle. 0.3 mL 0   indapamide (LOZOL) 1.25 MG tablet Take 1 tablet (1.25 mg total) by mouth daily. 90 tablet 2   meclizine (ANTIVERT) 25 MG tablet Take 1 tablet by mouth 3 times daily as needed for dizziness. 30 tablet 0   oxyCODONE-acetaminophen (PERCOCET) 5-325 MG tablet Take 1-2 tablets by mouth every 6 (six) hours as needed for severe pain. (Patient not taking: Reported on 11/25/2021) 40 tablet 0   pravastatin (PRAVACHOL) 20 MG tablet Take 1 tablet (20 mg total) by mouth daily. 90 tablet 3   Semaglutide, 1 MG/DOSE, (OZEMPIC, 1 MG/DOSE,) 4 MG/3ML SOPN Inject 1 mg as directed once a week. 3 mL 2   tiZANidine (ZANAFLEX) 2 MG tablet Take 1 tablet (2 mg total) by mouth every 8 (eight) hours as needed for muscle spasms. 90 tablet 1   No current facility-administered medications on file prior to visit.    Past Medical History:  Diagnosis Date   Anemia    Back pain    Clotting disorder ([place])    Diabetes mellitus without complication ([place])    Edema 04/25/2015   Headache(784.0)    Herniated disc    History of colonic polyps    Hypertension    Joint pain    Obesity    Osteoarthritis    TIA (transient ischemic attack) 2007   hx of   Vasculitis (HCC)    L leg - Polyarthritis   Venous insufficiency    Vitamin D deficiency    No Known Allergies  Social History   Socioeconomic History   Marital status: Married    Spouse name:  [name]   Number of children: Not on file   Years of education: Not on file   Highest education level: Not on file  Occupational History   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  Other Topics Concern   Not on file  Social History Narrative   Not on file   Social Determinants of Health   Financial Resource Strain: Not on file  Food Insecurity: Not on file  Transportation Needs: Not on file  Physical Activity: Not on file  Stress: Not on file  Social Connections: Not on file    There were no vitals filed for this visit. There is no height or weight on file to calculate BMI.  Physical Exam  ASSESSMENT AND PLAN:   There are no diagnoses linked to this encounter.  No orders of the defined types were placed in this encounter.   No problem-specific Assessment & Plan notes found  for this encounter.   No follow-ups on file.   Betty G. Martinique, MD  [facility]. [place] office.

## 2022-02-23 ENCOUNTER — Ambulatory Visit: Payer: BC Managed Care – PPO | Admitting: Family Medicine

## 2022-02-23 ENCOUNTER — Encounter: Payer: BC Managed Care – PPO | Admitting: Physical Therapy

## 2022-03-06 ENCOUNTER — Other Ambulatory Visit (HOSPITAL_COMMUNITY): Payer: Self-pay

## 2022-03-12 ENCOUNTER — Other Ambulatory Visit (HOSPITAL_COMMUNITY): Payer: Self-pay

## 2022-03-13 ENCOUNTER — Encounter (HOSPITAL_COMMUNITY): Payer: Self-pay | Admitting: *Deleted

## 2022-03-27 NOTE — Progress Notes (Unsigned)
HPI: [name] is a 56 y.o. female, who is here today for chronic disease management.  Last seen on 10/13/21 Since her last visit she has followed with ortho and PT for right tibial plateau fracture. She is doing better, pain has improved, still having stiffness.  Elevated alkaline phosphatase levels and RUQ [place] positive for cholelithiasis, she has followed with surgeon but has postponed cholecystectomy until recovering from right knee surgery.  She mentions a lump on her back, which she has had for years, it is not causing symptoms. She has completed physical therapy for her leg fracture but still experiences some discomfort and stiffness.  Hyperlipidemia: Has not taken Pravastatin 20 mg daily. Lab Results  Component Value Date   CHOL 205 (H) 01/31/2021   HDL 70.40 01/31/2021   LDLCALC 116 (H) 01/31/2021   TRIG 93.0 01/31/2021   CHOLHDL 3 01/31/2021   Hypertension and CKD III:  Medications: Carvedilol 3.125 mg bid and Indapamide 1.25 mg daily. Her potassium was previously low at 2.9. Negative for unusual or severe headache, visual changes, exertional chest pain, dyspnea,  focal weakness, or edema.  Lab Results  Component Value Date   CREATININE 1.14 (H) 10/16/2021   BUN 17 10/16/2021   NA 136 10/16/2021   K 2.9 (L) 10/16/2021   CL 98 10/16/2021   CO2 25 10/16/2021   Diabetes Mellitus II: Dx'ed in 09/2014. She reports having lost 30 pounds since the last visit, she is eating healthier.  She is on Ozempic 1 mg weekly. Last HgA1C was 5.8 in 10/2021. She has not been checking her blood sugars. Negative for symptoms of hypoglycemia, polyuria, polydipsia, numbness extremities, foot ulcers/trauma  Lab Results  Component Value Date   MICROALBUR <0.2 08/02/2020   Review of Systems  Constitutional:  Negative for activity change, appetite change and fever.  HENT:  Negative for mouth sores, nosebleeds and sore throat.   Respiratory:  Negative for cough and wheezing.    Gastrointestinal:  Negative for abdominal pain, nausea and vomiting.       Negative for changes in bowel habits.  Genitourinary:  Negative for decreased urine volume and hematuria.  Skin:  Negative for rash.  Neurological:  Negative for syncope, facial asymmetry and weakness.  See other pertinent positives and negatives in HPI.  Current Outpatient Medications on File Prior to Visit  Medication Sig Dispense Refill   acetaminophen (TYLENOL) 500 MG tablet Take 1,000 mg by mouth every 8 (eight) hours.     carvedilol (COREG) 3.125 MG tablet Take 1 tablet (3.125 mg total) by mouth 2 (two) times daily with a meal. 180 tablet 2   indapamide (LOZOL) 1.25 MG tablet Take 1 tablet (1.25 mg total) by mouth daily. 90 tablet 2   meclizine (ANTIVERT) 25 MG tablet Take 1 tablet by mouth 3 times daily as needed for dizziness. 30 tablet 0   pravastatin (PRAVACHOL) 20 MG tablet Take 1 tablet (20 mg total) by mouth daily. 90 tablet 3   Semaglutide, 1 MG/DOSE, (OZEMPIC, 1 MG/DOSE,) 4 MG/3ML SOPN Inject 1 mg as directed once a week. 3 mL 2   tiZANidine (ZANAFLEX) 2 MG tablet Take 1 tablet (2 mg total) by mouth every 8 (eight) hours as needed for muscle spasms. 90 tablet 1   No current facility-administered medications on file prior to visit.    Past Medical History:  Diagnosis Date   Anemia    Back pain    Clotting disorder ([place])    Diabetes mellitus without  complication ([place])    Edema 04/25/2015   Headache(784.0)    Herniated disc    History of colonic polyps    Hypertension    Joint pain    Obesity    Osteoarthritis    TIA (transient ischemic attack) 2007   hx of   Vasculitis (HCC)    L leg - Polyarthritis   Venous insufficiency    Vitamin D deficiency    No Known Allergies  Social History   Socioeconomic History   Marital status: Married    Spouse name: [name]   Number of children: Not on file   Years of education: Not on file   Highest education level: Not on file  Occupational  History   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  Other Topics Concern   Not on file  Social History Narrative   Not on file   Social Determinants of Health   Financial Resource Strain: Not on file  Food Insecurity: Not on file  Transportation Needs: Not on file  Physical Activity: Not on file  Stress: Not on file  Social Connections: Not on file    Vitals:   03/30/22 1450  BP: 120/70  Pulse: 69  Resp: 16  Temp: 98.1 F (36.7 C)  SpO2: 99%   Body mass index is 36.63 kg/m.  Physical Exam Vitals and nursing note reviewed.  Constitutional:      General: She is not in acute distress.    Appearance: She is well-developed.  HENT:     Head: Normocephalic and atraumatic.     Mouth/Throat:     Mouth: Mucous membranes are moist.     Pharynx: Oropharynx is clear. Uvula midline.  Eyes:     Conjunctiva/sclera: Conjunctivae normal.  Neck:     Trachea: No tracheal deviation.  Cardiovascular:     Rate and Rhythm: Normal rate and regular rhythm.     Pulses:          Dorsalis pedis pulses are 2+ on the right side and 2+ on the left side.     Heart sounds: No murmur heard. Pulmonary:     Effort: Pulmonary effort is normal. No respiratory distress.     Breath sounds: Normal breath sounds.  Abdominal:     Palpations: Abdomen is soft. There is no hepatomegaly or mass.     Tenderness: There is no abdominal tenderness.  Musculoskeletal:       Back:  Lymphadenopathy:     Cervical: No cervical adenopathy.  Skin:    General: Skin is warm.     Findings: No erythema or rash.  Neurological:     General: No focal deficit present.     Mental Status: She is alert and oriented to person,  place, and time.     Cranial Nerves: No cranial nerve deficit.     Gait: Gait normal.  Psychiatric:        Mood and Affect: Mood and affect normal.   ASSESSMENT AND PLAN:  [name] was seen today for follow-up.  Diagnoses and all orders for this visit: Lab Results  Component Value Date   HGBA1C 5.3 03/30/2022  Lab Results  Component Value Date   LABMICR CANCELED 07/12/2019   MICROALBUR 0.8 03/30/2022   MICROALBUR <0.2 08/02/2020   Lab Results  Component Value Date   CREATININE 1.20 03/30/2022   BUN 21 03/30/2022   NA 139 03/30/2022   K 3.5 03/30/2022   CL 99 03/30/2022   CO2 31 03/30/2022   Type 2 diabetes mellitus with other specified complication, without long-term current use of insulin (HCC) Assessment & Plan: HgA1C at goal, it went from 5.8 to 5.3. Decrease Ozempic from 1 mg to 0.5 mg weekly. Annual eye exam, periodic dental and foot care to continue. F/U in 5 months.  Orders: -     POCT glycosylated hemoglobin (Hb A1C) -     Microalbumin / creatinine urine ratio; Future -     Ozempic (1 MG/DOSE); Inject 0.5 mg as directed once a week.  Dispense: 3 mL; Refill: 2  Essential hypertension, benign Assessment & Plan: BP adequately controlled. Continue Carvedilol 3.125 mg bid, Indapamide 1.25 mg daily,and low salt diet. Eye exam current. Monitor BP at home.   Hyperlipidemia associated with type 2 diabetes mellitus ([place]) Assessment & Plan: She is not taking Pravastatin 20 mg, agrees with taking it consistently 3 times per week. Low fat diet to continue. Will plan on FLP next visit.  Orders: -     Pravastatin Sodium; Take 1 tablet (20 mg total) by mouth 3 (three) times a week.  Dispense: 45 tablet; Refill: 2  Need for influenza vaccination -     Flu Vaccine QUAD 37moIM (Fluarix, Fluzone & Alfiuria Quad PF)  Stage 3a chronic kidney disease (HCC) Assessment & Plan: Adequate hydration,BP and glucose controlled. Continue low salt diet and avoidance of  NSAID's.controlled recommended.  Orders: -     Basic metabolic panel; Future  Alkaline phosphatase elevation Assessment & Plan: Otherwise stable since 2018. Cholelithiasis , instructed to call her surgeon to schedule cholecystotomy. Instructed about warning signs.  Orders: -     Hepatic function panel; Future  Hypokalemia Assessment & Plan: She is currently on Indapamide, which could be causing problem. If not back to normal will consider changing to a different antihypertensive med or adding KLOR 10 meq daily.  Orders: -     Basic metabolic panel; Future  Lipoma of back Assessment & Plan: Upper right side of her back. We discussed Dx,prognosis,and treatment. She can have it removed by her surgeon, she will let mw know if she needs a new referral.   Return in about 5 months (around 08/29/2022).  Gaye Scorza G. JMartinique MD  [facility].

## 2022-03-30 ENCOUNTER — Ambulatory Visit (INDEPENDENT_AMBULATORY_CARE_PROVIDER_SITE_OTHER): Payer: BC Managed Care – PPO | Admitting: Family Medicine

## 2022-03-30 ENCOUNTER — Encounter: Payer: Self-pay | Admitting: Family Medicine

## 2022-03-30 VITALS — BP 120/70 | HR 69 | Temp 98.1°F | Resp 16 | Ht 62.0 in | Wt 200.2 lb

## 2022-03-30 DIAGNOSIS — I10 Essential (primary) hypertension: Secondary | ICD-10-CM

## 2022-03-30 DIAGNOSIS — E876 Hypokalemia: Secondary | ICD-10-CM

## 2022-03-30 DIAGNOSIS — Z23 Encounter for immunization: Secondary | ICD-10-CM | POA: Diagnosis not present

## 2022-03-30 DIAGNOSIS — N1831 Chronic kidney disease, stage 3a: Secondary | ICD-10-CM

## 2022-03-30 DIAGNOSIS — E1169 Type 2 diabetes mellitus with other specified complication: Secondary | ICD-10-CM

## 2022-03-30 DIAGNOSIS — R748 Abnormal levels of other serum enzymes: Secondary | ICD-10-CM

## 2022-03-30 DIAGNOSIS — D171 Benign lipomatous neoplasm of skin and subcutaneous tissue of trunk: Secondary | ICD-10-CM

## 2022-03-30 DIAGNOSIS — E785 Hyperlipidemia, unspecified: Secondary | ICD-10-CM

## 2022-03-30 LAB — POCT GLYCOSYLATED HEMOGLOBIN (HGB A1C): Hemoglobin A1C: 5.3 % (ref 4.0–5.6)

## 2022-03-30 MED ORDER — PRAVASTATIN SODIUM 20 MG PO TABS: 20.0000 mg | ORAL_TABLET | ORAL | 2 refills | Status: DC

## 2022-03-30 MED ORDER — OZEMPIC (1 MG/DOSE) 4 MG/3ML ~~LOC~~ SOPN: 0.5000 mg | PEN_INJECTOR | SUBCUTANEOUS | 2 refills | Status: DC

## 2022-03-30 NOTE — Assessment & Plan Note (Signed)
HgA1C at goal, it went from 5.8 to 5.3. Decrease Ozempic from 1 mg to 0.5 mg weekly. Annual eye exam, periodic dental and foot care to continue. F/U in 5 months.

## 2022-03-30 NOTE — Assessment & Plan Note (Signed)
She is not taking Pravastatin 20 mg, agrees with taking it consistently 3 times per week. Low fat diet to continue. Will plan on FLP next visit.

## 2022-03-30 NOTE — Assessment & Plan Note (Addendum)
Otherwise stable since 2018. Cholelithiasis , instructed to call her surgeon to schedule cholecystotomy. Instructed about warning signs.

## 2022-03-30 NOTE — Assessment & Plan Note (Signed)
BP adequately controlled. Continue Carvedilol 3.125 mg bid, Indapamide 1.25 mg daily,and low salt diet. Eye exam current. Monitor BP at home.

## 2022-03-30 NOTE — Assessment & Plan Note (Signed)
Adequate hydration,BP and glucose controlled. Continue low salt diet and avoidance of NSAID's.controlled recommended.

## 2022-03-30 NOTE — Patient Instructions (Addendum)
A few things to remember from today's visit:  Type 2 diabetes mellitus with other specified complication, without long-term current use of insulin ([place]) - Plan: POC HgB A1c, Microalbumin / creatinine urine ratio, Microalbumin / creatinine urine ratio  Essential hypertension, benign  Hyperlipidemia associated with type 2 diabetes mellitus ([place]) - Plan: pravastatin (PRAVACHOL) 20 MG tablet  Need for influenza vaccination - Plan: Flu Vaccine QUAD 35moIM (Fluarix, Fluzone & Alfiuria Quad PF)  Stage 3a chronic kidney disease ([place] - Plan: Basic metabolic panel, Basic metabolic panel  Alkaline phosphatase elevation - Plan: Hepatic function panel, Hepatic function panel  Hypokalemia - Plan: Basic metabolic panel, Basic metabolic panel  If potassium is still low, we will need to stop Indapamide or add potassium supplementation. Decreased ozempic from 1 mg weekly to 0.5 mg weekly. No changes in rest. Call surgeon to schedule gallbladder surgery and lopoma removal.  If you need refills for medications you take chronically, please call your pharmacy. Do not use My Chart to request refills or for acute issues that need immediate attention. If you send a my chart message, it may take a few days to be addressed, specially if I am not in the office.  Please be sure medication list is accurate. If a new problem present, please set up appointment sooner than planned today.

## 2022-03-31 ENCOUNTER — Encounter: Payer: Self-pay | Admitting: Family Medicine

## 2022-03-31 LAB — BASIC METABOLIC PANEL
BUN: 21 mg/dL (ref 6–23)
CO2: 31 mEq/L (ref 19–32)
Calcium: 9.9 mg/dL (ref 8.4–10.5)
Chloride: 99 mEq/L (ref 96–112)
Creatinine, Ser: 1.2 mg/dL (ref 0.40–1.20)
GFR: 51.09 mL/min — ABNORMAL LOW (ref >60.00)
Glucose, Bld: 67 mg/dL — ABNORMAL LOW (ref 70–99)
Potassium: 3.5 mEq/L (ref 3.5–5.1)
Sodium: 139 mEq/L (ref 135–145)

## 2022-03-31 LAB — MICROALBUMIN / CREATININE URINE RATIO
Creatinine,U: 215 mg/dL
Microalb Creat Ratio: 0.4 mg/g (ref 0.0–30.0)
Microalb, Ur: 0.8 mg/dL (ref 0.0–1.9)

## 2022-03-31 LAB — HEPATIC FUNCTION PANEL
ALT: 17 U/L (ref 0–35)
AST: 24 U/L (ref 0–37)
Albumin: 4.2 g/dL (ref 3.5–5.2)
Alkaline Phosphatase: 258 U/L — ABNORMAL HIGH (ref 39–117)
Bilirubin, Direct: 0 mg/dL (ref 0.0–0.3)
Total Bilirubin: 0.3 mg/dL (ref 0.2–1.2)
Total Protein: 7.8 g/dL (ref 6.0–8.3)

## 2022-04-02 NOTE — Assessment & Plan Note (Signed)
She is currently on Indapamide, which could be causing problem. If not back to normal will consider changing to a different antihypertensive med or adding KLOR 10 meq daily.

## 2022-04-02 NOTE — Assessment & Plan Note (Signed)
Upper right side of her back. We discussed Dx,prognosis,and treatment. She can have it removed by her surgeon, she will let mw know if she needs a new referral.

## 2022-04-15 ENCOUNTER — Other Ambulatory Visit (HOSPITAL_COMMUNITY): Payer: Self-pay

## 2022-04-15 ENCOUNTER — Encounter: Payer: Self-pay | Admitting: Family Medicine

## 2022-04-15 ENCOUNTER — Ambulatory Visit: Payer: BC Managed Care – PPO | Admitting: Family Medicine

## 2022-04-15 ENCOUNTER — Telehealth: Payer: Self-pay

## 2022-04-15 VITALS — BP 108/76 | HR 80 | Temp 98.0°F | Wt 199.0 lb

## 2022-04-15 DIAGNOSIS — R519 Headache, unspecified: Secondary | ICD-10-CM

## 2022-04-15 DIAGNOSIS — J020 Streptococcal pharyngitis: Secondary | ICD-10-CM | POA: Diagnosis not present

## 2022-04-15 DIAGNOSIS — R059 Cough, unspecified: Secondary | ICD-10-CM | POA: Diagnosis not present

## 2022-04-15 DIAGNOSIS — R52 Pain, unspecified: Secondary | ICD-10-CM

## 2022-04-15 LAB — POCT INFLUENZA A/B
Influenza A, POC: NEGATIVE
Influenza B, POC: NEGATIVE

## 2022-04-15 LAB — POCT RAPID STREP A (OFFICE): Rapid Strep A Screen: POSITIVE — AB

## 2022-04-15 LAB — POC COVID19 BINAXNOW: SARS Coronavirus 2 Ag: NEGATIVE

## 2022-04-15 MED ORDER — CEFUROXIME AXETIL 500 MG PO TABS
500.0000 mg | ORAL_TABLET | Freq: Two times a day (BID) | ORAL | 0 refills | Status: AC
  Filled 2022-04-15: qty 20, 10d supply, fill #0

## 2022-04-15 NOTE — Progress Notes (Signed)
   Subjective:    Patient ID: [name], female    DOB: [birth year], 57 y.o.   MRN: [id number]  HPI Here for 4 days of headache, body aches, ST, and a dry cough. No fever or SOB. Drinking fluids.   Review of Systems  Constitutional: Negative.   HENT:  Positive for congestion and sore throat. Negative for ear pain, postnasal drip and sinus pressure.   Eyes: Negative.   Respiratory:  Positive for cough. Negative for shortness of breath and wheezing.        Objective:   Physical Exam Constitutional:      Appearance: Normal appearance. She is not ill-appearing.  HENT:     Right Ear: Tympanic membrane, ear canal and external ear normal.     Left Ear: Tympanic membrane, ear canal and external ear normal.     Nose: Nose normal.     Mouth/Throat:     Pharynx: Oropharynx is clear.  Eyes:     Conjunctiva/sclera: Conjunctivae normal.  Pulmonary:     Effort: Pulmonary effort is normal.     Breath sounds: Normal breath sounds.  Lymphadenopathy:     Cervical: No cervical adenopathy.  Neurological:     Mental Status: She is alert.           Assessment & Plan:  Strep pharyngitis, treat with 10 days of Cefuroxime.  Alysia Penna, MD

## 2022-04-15 NOTE — Patient Instructions (Signed)
Health Maintenance Due  Topic Date Due   HIV Screening  Never done   COVID-19 Vaccine (5 - 2023-24 season) 12/12/2021   MAMMOGRAM  04/13/2022      Row Labels 03/30/2022    3:01 PM 10/13/2021   10:37 AM 06/04/2021   10:55 PM  Depression screen PHQ 2/9   Section Header. No data exists in this row.     Decreased Interest   0 0 0  Down, Depressed, Hopeless   0 0 0  PHQ - 2 Score   0 0 0

## 2022-04-15 NOTE — Telephone Encounter (Signed)
---  She has a headache, cough, stuffiness, and body Aches  04/15/2022 8:10:53 AM Call PCP within 24 Hours Turner, RN, [name]  Pt has appt with Dr Sarajane Jews today.

## 2022-04-16 ENCOUNTER — Ambulatory Visit: Payer: BC Managed Care – PPO | Admitting: Family Medicine

## 2022-04-22 ENCOUNTER — Telehealth: Payer: Self-pay | Admitting: Family Medicine

## 2022-04-22 ENCOUNTER — Other Ambulatory Visit (HOSPITAL_COMMUNITY): Payer: Self-pay

## 2022-04-22 ENCOUNTER — Encounter: Payer: Self-pay | Admitting: Family Medicine

## 2022-04-22 DIAGNOSIS — E1169 Type 2 diabetes mellitus with other specified complication: Secondary | ICD-10-CM

## 2022-04-22 MED ORDER — OZEMPIC (0.25 OR 0.5 MG/DOSE) 2 MG/3ML ~~LOC~~ SOPN
0.5000 mg | PEN_INJECTOR | SUBCUTANEOUS | 3 refills | Status: DC
  Filled 2022-04-22 (×2): qty 3, 28d supply, fill #0
  Filled 2022-06-22: qty 3, 28d supply, fill #1
  Filled 2022-08-11: qty 3, 28d supply, fill #2
  Filled 2022-09-28: qty 3, 28d supply, fill #3

## 2022-04-22 MED ORDER — OZEMPIC (1 MG/DOSE) 4 MG/3ML ~~LOC~~ SOPN
0.5000 mg | PEN_INJECTOR | SUBCUTANEOUS | 2 refills | Status: DC
  Filled 2022-04-22: qty 3, 28d supply, fill #0

## 2022-04-22 NOTE — Telephone Encounter (Signed)
Monica phar with [facility] is calling and they need a new rx Semaglutide, 0.25 mg

## 2022-04-22 NOTE — Telephone Encounter (Signed)
Rx changed & sent.

## 2022-04-24 DIAGNOSIS — E569 Vitamin deficiency, unspecified: Secondary | ICD-10-CM | POA: Diagnosis not present

## 2022-04-24 DIAGNOSIS — R5383 Other fatigue: Secondary | ICD-10-CM | POA: Diagnosis not present

## 2022-04-24 DIAGNOSIS — Z1321 Encounter for screening for nutritional disorder: Secondary | ICD-10-CM | POA: Diagnosis not present

## 2022-05-01 ENCOUNTER — Other Ambulatory Visit (HOSPITAL_COMMUNITY): Payer: Self-pay

## 2022-05-14 ENCOUNTER — Encounter: Payer: Self-pay | Admitting: Family Medicine

## 2022-05-15 ENCOUNTER — Encounter: Payer: Self-pay | Admitting: Family Medicine

## 2022-06-16 DIAGNOSIS — H35033 Hypertensive retinopathy, bilateral: Secondary | ICD-10-CM | POA: Diagnosis not present

## 2022-06-16 LAB — HM DIABETES EYE EXAM

## 2022-06-22 ENCOUNTER — Other Ambulatory Visit (HOSPITAL_COMMUNITY): Payer: Self-pay

## 2022-07-07 ENCOUNTER — Encounter: Payer: Self-pay | Admitting: Family Medicine

## 2022-08-11 ENCOUNTER — Other Ambulatory Visit (HOSPITAL_COMMUNITY): Payer: Self-pay

## 2022-08-26 NOTE — Progress Notes (Unsigned)
HPI: [name] is a 57 y.o. female, who is here today for chronic disease management.  Last seen on 03/30/22. Since her last visit she has follow-up with [facility] at [facility], evaluated for cholelithiasis and elevated alkaline phosphatase. She has not scheduled cholecystectomy yet.  She describes a recent episode of feeling like she was about to pass out while at [employer] with a coworker. This incident involved sudden sweating and a sensation of overheating, which was temporarily alleviated by consuming Coca-Cola and a cookie. She was unable to check her blood sugar levels at the time. No associated CP or palpitations.  Requesting refills for meclizine, which she takes as needed for vertigo. She is not having any symptoms at this time but like to have it in case she needs it.  Also takes Zanaflex for muscle spasms in the mid/lower back. Pain is not radiated, negative for saddle anesthesia or bladder/bowel dysfunction.  Diabetes Mellitus II: Dx'ed in 09/2014.  She reports gaining eight pounds since the last visit in December, attributing the weight gain to an increase in appetite following a reduction in the dosage of Ozempic from a higher dose to 0.5 mg.  Checking BG at home: Not frequent, 160's. Eye exam: 06/2022. Foot exam: 05/2021.  - Negative for symptoms of hypoglycemia, polyuria, polydipsia, numbness extremities, foot ulcers/trauma  Lab Results  Component Value Date   HGBA1C 5.3 03/30/2022   Lab Results  Component Value Date   MICROALBUR 0.8 03/30/2022   Hypertension: Currently she is on Carvedilol 3.125 mg bid and Indapamine 1.25 mg daily. Home BP's 120's/70's. Negative for unusual or severe headache, visual changes, exertional chest pain, dyspnea,  focal weakness, or edema.  Lab Results  Component Value Date   CREATININE 1.20 03/30/2022   BUN 21 03/30/2022   NA 139 03/30/2022   K 3.5 03/30/2022   CL 99 03/30/2022   CO2 31 03/30/2022   She  is also experiencing muscle aches, which she attributes to the use of pravastatin, she tried taking medication 3 times per week and still having symptoms. She expresses a desire to explore alternative treatments, due to the severity of the muscle pain, which significantly impacts her mobility and quality of life.  Lab Results  Component Value Date   CHOL 205 (H) 01/31/2021   HDL 70.40 01/31/2021   LDLCALC 116 (H) 01/31/2021   TRIG 93.0 01/31/2021   CHOLHDL 3 01/31/2021   Review of Systems  Constitutional:  Negative for chills and fever.  Respiratory:  Negative for cough and wheezing.   Gastrointestinal:  Negative for abdominal pain, nausea and vomiting.  Genitourinary:  Negative for decreased urine volume, dysuria and hematuria.  Musculoskeletal:  Positive for arthralgias. Negative for gait problem.  Skin:  Negative for rash.  Neurological:  Negative for syncope and facial asymmetry.  See other pertinent positives and negatives in HPI.  Current Outpatient Medications on File Prior to Visit  Medication Sig Dispense Refill   acetaminophen (TYLENOL) 500 MG tablet Take 1,000 mg by mouth every 8 (eight) hours.     carvedilol (COREG) 3.125 MG tablet Take 1 tablet (3.125 mg total) by mouth 2 (two) times daily with a meal. 180 tablet 2   indapamide (LOZOL) 1.25 MG tablet Take 1 tablet (1.25 mg total) by mouth daily. 90 tablet 2   Semaglutide,0.25 or 0.5MG /DOS, (OZEMPIC, 0.25 OR 0.5 MG/DOSE,) 2 MG/3ML SOPN Inject 0.5 mg into the skin once a week. 9 mL 3   No current facility-administered  medications on file prior to visit.   Past Medical History:  Diagnosis Date   Anemia    Back pain    Clotting disorder (HCC)    Diabetes mellitus without complication (HCC)    Edema 04/25/2015   Headache(784.0)    Herniated disc    History of colonic polyps    Hypertension    Joint pain    Obesity    Osteoarthritis    TIA (transient ischemic attack) 2007   hx of   Vasculitis (HCC)    L leg -  Polyarthritis   Venous insufficiency    Vitamin D deficiency    No Known Allergies  Social History   Socioeconomic History   Marital status: Married    Spouse name: [name]   Number of children: Not on file   Years of education: Not on file   Highest education level: Not on file  Occupational History   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  Other Topics Concern   Not on file  Social History Narrative   Not on file   Social Determinants of Health   Financial Resource Strain: Not on file  Food Insecurity: Not on file  Transportation Needs: Not on file  Physical Activity: Not on file  Stress: Not on file  Social Connections: Not on file   Vitals:   08/28/22 0954  BP: 102/74  Pulse: 91  Resp: 16  Temp: 98 F (36.7 C)  SpO2: 100%   Wt Readings from Last 3 Encounters:  08/28/22 208 lb 4 oz (94.5 kg)  04/15/22 199 lb (90.3 kg)  03/30/22 200 lb 4 oz (90.8 kg)   Body mass index is 38.09 kg/m.  Physical Exam Vitals and nursing note reviewed.  Constitutional:      General: She is not in acute distress.    Appearance: She is well-developed.  HENT:     Head: Normocephalic and atraumatic.     Mouth/Throat:     Mouth: Mucous membranes are moist.     Pharynx: Oropharynx is clear.  Eyes:     Conjunctiva/sclera: Conjunctivae normal.  Cardiovascular:     Rate and Rhythm: Normal rate and regular rhythm.     Pulses:          Dorsalis pedis pulses are 2+ on the right side and 2+ on the left side.     Heart sounds: No murmur heard. Pulmonary:     Effort: Pulmonary effort is normal. No respiratory distress.     Breath sounds: Normal breath sounds.  Abdominal:     Palpations:  Abdomen is soft. There is no hepatomegaly or mass.     Tenderness: There is no abdominal tenderness.  Musculoskeletal:     Left hand: No swelling, tenderness or bony tenderness. Normal range of motion. Normal capillary refill.  Lymphadenopathy:     Cervical: No cervical adenopathy.  Skin:    General: Skin is warm.     Findings: No erythema or rash.  Neurological:     General: No focal deficit present.     Mental Status: She is alert and oriented to person, place, and time.  Cranial Nerves: No cranial nerve deficit.     Gait: Gait normal.  Psychiatric:        Mood and Affect: Mood and affect normal.   ASSESSMENT AND PLAN:  [name] was seen today for medical management of chronic issues.  Diagnoses and all orders for this visit: Lab Results  Component Value Date   HGBA1C 5.6 08/28/2022   Lab Results  Component Value Date   CREATININE 1.03 08/28/2022   BUN 21 08/28/2022   NA 139 08/28/2022   K 4.1 08/28/2022   CL 101 08/28/2022   CO2 32 08/28/2022    Type 2 diabetes mellitus with other specified complication, without long-term current use of insulin (HCC) Assessment & Plan: Problem has been well controlled, last HgA1C 5.3. Continue Ozempic 0.5 mg weekly. Annual eye exam, periodic dental and foot care to continue. F/U in 5 months.  Orders: -     Accu-Chek Aviva Plus; As directed.  Dispense: 1 kit; Refill: 0 -     Hemoglobin A1c; Future  Benign paroxysmal positional vertigo of left ear Assessment & Plan: Asymptomatic at this time. She takes Meclizine 25 mg daily as needed and has helped.  Refill sent as requested.  Orders: -     Meclizine HCl; Take 1 tablet (25 mg total) by mouth daily as needed for dizziness.  Dispense: 30 tablet; Refill: 0  DDD (degenerative disc disease), thoracic Assessment & Plan: Occasional muscle spasms. Continue Zanaflex 4 mg daily prn. Some side effects discussed.  Orders: -     tiZANidine HCl; Take 1 tablet (2 mg total) by  mouth 2 (two) times daily as needed for muscle spasms.  Dispense: 60 tablet; Refill: 1  Essential hypertension, benign Assessment & Plan: Today SBP mildly low but reporting home BP's 120's/70's. Continue Carvedilol 3.125 mg bid, Indapamide 1.25 mg daily,and low salt diet. Eye exam current. Continue monitoring BP at home.  Orders: -     Basic metabolic panel; Future  Statin myopathy Assessment & Plan: Severe muscle aches with Pravastatin, even when taken 3 times per week.   Hyperlipidemia associated with type 2 diabetes mellitus (HCC) Assessment & Plan: She did not tolerate Pravastatin, caused severe myalgias. Zetia 10 mg daily recommended. Continue low fat diet. Will plan on fasting labs next visit.  Orders: -     Ezetimibe; Take 1 tablet (10 mg total) by mouth daily.  Dispense: 90 tablet; Refill: 3  Severe obesity (BMI 35.0-35.9 with comorbidity) (HCC)-DM II,OA,HTN,HLD Assessment & Plan: She understands benefits of wt loss as well as adverse effects of obesity. Consistency with healthy diet and physical activity encouraged. Explained to pt that Ozempic has been prescribed for DM II.  Benign paroxysmal positional vertigo, unspecified laterality Assessment & Plan: Asymptomatic at this time. She takes Meclizine 25 mg daily as needed and has helped.  Refill sent as requested.  Pre-syncope We discussed possible causes. ? Vasovagal. Continue monitoring BP and BS's. Adequate hydration.  Return in about 6 months (around 02/28/2023) for CPE.  Antonea Gaut G. [place], MD  [facility]. [place] office.

## 2022-08-28 ENCOUNTER — Ambulatory Visit: Payer: BC Managed Care – PPO | Admitting: Family Medicine

## 2022-08-28 ENCOUNTER — Other Ambulatory Visit (HOSPITAL_COMMUNITY): Payer: Self-pay

## 2022-08-28 ENCOUNTER — Encounter: Payer: Self-pay | Admitting: Family Medicine

## 2022-08-28 VITALS — BP 102/74 | HR 91 | Temp 98.0°F | Resp 16 | Ht 62.0 in | Wt 208.2 lb

## 2022-08-28 DIAGNOSIS — E1169 Type 2 diabetes mellitus with other specified complication: Secondary | ICD-10-CM

## 2022-08-28 DIAGNOSIS — H8112 Benign paroxysmal vertigo, left ear: Secondary | ICD-10-CM

## 2022-08-28 DIAGNOSIS — Z7985 Long-term (current) use of injectable non-insulin antidiabetic drugs: Secondary | ICD-10-CM | POA: Diagnosis not present

## 2022-08-28 DIAGNOSIS — R55 Syncope and collapse: Secondary | ICD-10-CM

## 2022-08-28 DIAGNOSIS — G720 Drug-induced myopathy: Secondary | ICD-10-CM

## 2022-08-28 DIAGNOSIS — Z6835 Body mass index (BMI) 35.0-35.9, adult: Secondary | ICD-10-CM

## 2022-08-28 DIAGNOSIS — E785 Hyperlipidemia, unspecified: Secondary | ICD-10-CM

## 2022-08-28 DIAGNOSIS — I10 Essential (primary) hypertension: Secondary | ICD-10-CM

## 2022-08-28 DIAGNOSIS — H8110 Benign paroxysmal vertigo, unspecified ear: Secondary | ICD-10-CM | POA: Insufficient documentation

## 2022-08-28 DIAGNOSIS — M546 Pain in thoracic spine: Secondary | ICD-10-CM

## 2022-08-28 DIAGNOSIS — T466X5A Adverse effect of antihyperlipidemic and antiarteriosclerotic drugs, initial encounter: Secondary | ICD-10-CM

## 2022-08-28 DIAGNOSIS — M5134 Other intervertebral disc degeneration, thoracic region: Secondary | ICD-10-CM

## 2022-08-28 DIAGNOSIS — M79642 Pain in left hand: Secondary | ICD-10-CM

## 2022-08-28 DIAGNOSIS — R748 Abnormal levels of other serum enzymes: Secondary | ICD-10-CM

## 2022-08-28 LAB — BASIC METABOLIC PANEL
BUN: 21 mg/dL (ref 6–23)
CO2: 32 mEq/L (ref 19–32)
Calcium: 9.9 mg/dL (ref 8.4–10.5)
Chloride: 101 mEq/L (ref 96–112)
Creatinine, Ser: 1.03 mg/dL (ref 0.40–1.20)
GFR: 61.19 mL/min (ref >60.00)
Glucose, Bld: 72 mg/dL (ref 70–99)
Potassium: 4.1 mEq/L (ref 3.5–5.1)
Sodium: 139 mEq/L (ref 135–145)

## 2022-08-28 LAB — HEMOGLOBIN A1C: Hgb A1c MFr Bld: 5.6 % (ref 4.6–6.5)

## 2022-08-28 MED ORDER — TIZANIDINE HCL 2 MG PO TABS
2.0000 mg | ORAL_TABLET | Freq: Two times a day (BID) | ORAL | 1 refills | Status: DC | PRN
  Filled 2022-08-28: qty 60, 30d supply, fill #0

## 2022-08-28 MED ORDER — ACCU-CHEK AVIVA PLUS W/DEVICE KIT
PACK | 0 refills | Status: AC
  Filled 2022-08-28 – 2022-09-28 (×2): qty 1, 30d supply, fill #0
  Filled 2022-09-28: qty 1, 90d supply, fill #0

## 2022-08-28 MED ORDER — MECLIZINE HCL 25 MG PO TABS
25.0000 mg | ORAL_TABLET | Freq: Every day | ORAL | 0 refills | Status: DC | PRN
  Filled 2022-08-28 – 2022-12-17 (×2): qty 30, 30d supply, fill #0

## 2022-08-28 MED ORDER — EZETIMIBE 10 MG PO TABS
10.0000 mg | ORAL_TABLET | Freq: Every day | ORAL | 3 refills | Status: DC
  Filled 2022-08-28: qty 90, 90d supply, fill #0
  Filled 2022-12-17: qty 90, 90d supply, fill #1
  Filled 2023-03-27 – 2023-04-09 (×2): qty 90, 90d supply, fill #2
  Filled 2023-07-01: qty 90, 90d supply, fill #3

## 2022-08-28 NOTE — Patient Instructions (Addendum)
A few things to remember from today's visit:  Type 2 diabetes mellitus with other specified complication, without long-term current use of insulin (HCC) - Plan: Blood Glucose Monitoring Suppl (ACCU-CHEK AVIVA PLUS) w/Device KIT, Hemoglobin A1c  Benign paroxysmal positional vertigo of left ear - Plan: meclizine (ANTIVERT) 25 MG tablet  DDD (degenerative disc disease), thoracic - Plan: tiZANidine (ZANAFLEX) 2 MG tablet  Acute left-sided thoracic back pain - Plan: tiZANidine (ZANAFLEX) 2 MG tablet  Essential hypertension, benign - Plan: Basic metabolic panel  Alkaline phosphatase elevation  Statin myopathy  Hyperlipidemia associated with type 2 diabetes mellitus (HCC) - Plan: ezetimibe (ZETIA) 10 MG tablet  Stop Pravastatin. Start Zetia 10 mg daily. No changes in rest of the medications.  If you need refills for medications you take chronically, please call your pharmacy. Do not use My Chart to request refills or for acute issues that need immediate attention. If you send a my chart message, it may take a few days to be addressed, specially if I am not in the office.  Please be sure medication list is accurate. If a new problem present, please set up appointment sooner than planned today.

## 2022-08-29 DIAGNOSIS — G720 Drug-induced myopathy: Secondary | ICD-10-CM | POA: Insufficient documentation

## 2022-08-29 NOTE — Assessment & Plan Note (Signed)
Problem has been well controlled, last HgA1C 5.3. Continue Ozempic 0.5 mg weekly. Annual eye exam, periodic dental and foot care to continue. F/U in 5 months.

## 2022-08-29 NOTE — Assessment & Plan Note (Signed)
Severe muscle aches with Pravastatin, even when taken 3 times per week.

## 2022-08-29 NOTE — Assessment & Plan Note (Signed)
Today SBP mildly low but reporting home BP's 120's/70's. Continue Carvedilol 3.125 mg bid, Indapamide 1.25 mg daily,and low salt diet. Eye exam current. Continue monitoring BP at home.

## 2022-08-29 NOTE — Assessment & Plan Note (Signed)
Occasional muscle spasms. Continue Zanaflex 4 mg daily prn. Some side effects discussed.

## 2022-08-29 NOTE — Assessment & Plan Note (Addendum)
She understands benefits of wt loss as well as adverse effects of obesity. Consistency with healthy diet and physical activity encouraged. Explained to pt that Ozempic has been prescribed for DM II.

## 2022-08-29 NOTE — Assessment & Plan Note (Signed)
Asymptomatic at this time. She takes Meclizine 25 mg daily as needed and has helped.  Refill sent as requested.

## 2022-08-29 NOTE — Assessment & Plan Note (Signed)
She did not tolerate Pravastatin, caused severe myalgias. Zetia 10 mg daily recommended. Continue low fat diet. Will plan on fasting labs next visit.

## 2022-09-28 ENCOUNTER — Other Ambulatory Visit (HOSPITAL_COMMUNITY): Payer: Self-pay

## 2022-10-07 DIAGNOSIS — Z01419 Encounter for gynecological examination (general) (routine) without abnormal findings: Secondary | ICD-10-CM | POA: Diagnosis not present

## 2022-10-07 DIAGNOSIS — Z1231 Encounter for screening mammogram for malignant neoplasm of breast: Secondary | ICD-10-CM | POA: Diagnosis not present

## 2022-11-06 ENCOUNTER — Emergency Department (HOSPITAL_BASED_OUTPATIENT_CLINIC_OR_DEPARTMENT_OTHER): Payer: BC Managed Care – PPO

## 2022-11-06 ENCOUNTER — Other Ambulatory Visit: Payer: Self-pay

## 2022-11-06 ENCOUNTER — Encounter (HOSPITAL_BASED_OUTPATIENT_CLINIC_OR_DEPARTMENT_OTHER): Payer: Self-pay

## 2022-11-06 ENCOUNTER — Inpatient Hospital Stay (HOSPITAL_BASED_OUTPATIENT_CLINIC_OR_DEPARTMENT_OTHER)
Admission: EM | Admit: 2022-11-06 | Discharge: 2022-11-10 | DRG: 417 | Disposition: A | Discharge status: Home / Self Care | Payer: BC Managed Care – PPO | Attending: Internal Medicine | Admitting: Internal Medicine

## 2022-11-06 ENCOUNTER — Inpatient Hospital Stay (HOSPITAL_COMMUNITY): Payer: BC Managed Care – PPO

## 2022-11-06 DIAGNOSIS — K8012 Calculus of gallbladder with acute and chronic cholecystitis without obstruction: Principal | ICD-10-CM | POA: Diagnosis present

## 2022-11-06 DIAGNOSIS — E876 Hypokalemia: Secondary | ICD-10-CM | POA: Diagnosis not present

## 2022-11-06 DIAGNOSIS — R748 Abnormal levels of other serum enzymes: Secondary | ICD-10-CM | POA: Diagnosis not present

## 2022-11-06 DIAGNOSIS — K859 Acute pancreatitis without necrosis or infection, unspecified: Secondary | ICD-10-CM | POA: Diagnosis not present

## 2022-11-06 DIAGNOSIS — K8010 Calculus of gallbladder with chronic cholecystitis without obstruction: Secondary | ICD-10-CM | POA: Diagnosis not present

## 2022-11-06 DIAGNOSIS — E785 Hyperlipidemia, unspecified: Secondary | ICD-10-CM | POA: Diagnosis not present

## 2022-11-06 DIAGNOSIS — K828 Other specified diseases of gallbladder: Secondary | ICD-10-CM | POA: Diagnosis not present

## 2022-11-06 DIAGNOSIS — E1169 Type 2 diabetes mellitus with other specified complication: Secondary | ICD-10-CM

## 2022-11-06 DIAGNOSIS — I10 Essential (primary) hypertension: Secondary | ICD-10-CM | POA: Diagnosis not present

## 2022-11-06 DIAGNOSIS — Z8249 Family history of ischemic heart disease and other diseases of the circulatory system: Secondary | ICD-10-CM

## 2022-11-06 DIAGNOSIS — K819 Cholecystitis, unspecified: Secondary | ICD-10-CM | POA: Diagnosis not present

## 2022-11-06 DIAGNOSIS — R1013 Epigastric pain: Secondary | ICD-10-CM | POA: Diagnosis not present

## 2022-11-06 DIAGNOSIS — K851 Biliary acute pancreatitis without necrosis or infection: Secondary | ICD-10-CM | POA: Diagnosis not present

## 2022-11-06 DIAGNOSIS — Z823 Family history of stroke: Secondary | ICD-10-CM | POA: Diagnosis not present

## 2022-11-06 DIAGNOSIS — Z9884 Bariatric surgery status: Secondary | ICD-10-CM | POA: Diagnosis not present

## 2022-11-06 DIAGNOSIS — T402X5A Adverse effect of other opioids, initial encounter: Secondary | ICD-10-CM | POA: Diagnosis not present

## 2022-11-06 DIAGNOSIS — E119 Type 2 diabetes mellitus without complications: Secondary | ICD-10-CM | POA: Diagnosis not present

## 2022-11-06 DIAGNOSIS — K8066 Calculus of gallbladder and bile duct with acute and chronic cholecystitis without obstruction: Secondary | ICD-10-CM | POA: Diagnosis not present

## 2022-11-06 DIAGNOSIS — D7389 Other diseases of spleen: Secondary | ICD-10-CM | POA: Diagnosis not present

## 2022-11-06 DIAGNOSIS — Z8673 Personal history of transient ischemic attack (TIA), and cerebral infarction without residual deficits: Secondary | ICD-10-CM

## 2022-11-06 DIAGNOSIS — L299 Pruritus, unspecified: Secondary | ICD-10-CM | POA: Diagnosis not present

## 2022-11-06 DIAGNOSIS — Z7985 Long-term (current) use of injectable non-insulin antidiabetic drugs: Secondary | ICD-10-CM

## 2022-11-06 DIAGNOSIS — Z79899 Other long term (current) drug therapy: Secondary | ICD-10-CM | POA: Diagnosis not present

## 2022-11-06 DIAGNOSIS — Z6835 Body mass index (BMI) 35.0-35.9, adult: Secondary | ICD-10-CM

## 2022-11-06 DIAGNOSIS — K5730 Diverticulosis of large intestine without perforation or abscess without bleeding: Secondary | ICD-10-CM | POA: Diagnosis not present

## 2022-11-06 DIAGNOSIS — K429 Umbilical hernia without obstruction or gangrene: Secondary | ICD-10-CM | POA: Diagnosis not present

## 2022-11-06 DIAGNOSIS — K760 Fatty (change of) liver, not elsewhere classified: Secondary | ICD-10-CM | POA: Diagnosis not present

## 2022-11-06 DIAGNOSIS — R109 Unspecified abdominal pain: Secondary | ICD-10-CM | POA: Diagnosis not present

## 2022-11-06 DIAGNOSIS — K8020 Calculus of gallbladder without cholecystitis without obstruction: Secondary | ICD-10-CM | POA: Diagnosis not present

## 2022-11-06 LAB — COMPREHENSIVE METABOLIC PANEL
ALT: 125 U/L — ABNORMAL HIGH (ref 0–44)
ALT: 137 U/L — ABNORMAL HIGH (ref 0–44)
AST: 171 U/L — ABNORMAL HIGH (ref 15–41)
AST: 156 U/L — ABNORMAL HIGH (ref 15–41)
Albumin: 4 g/dL (ref 3.5–5.0)
Albumin: 3.2 g/dL — ABNORMAL LOW (ref 3.5–5.0)
Alkaline Phosphatase: 424 U/L — ABNORMAL HIGH (ref 38–126)
Alkaline Phosphatase: 424 U/L — ABNORMAL HIGH (ref 38–126)
Anion gap: 6 (ref 5–15)
Anion gap: 10 (ref 5–15)
BUN: 23 mg/dL — ABNORMAL HIGH (ref 6–20)
BUN: 18 mg/dL (ref 6–20)
CO2: 34 mmol/L — ABNORMAL HIGH (ref 22–32)
CO2: 30 mmol/L (ref 22–32)
Calcium: 9.6 mg/dL (ref 8.9–10.3)
Calcium: 8.9 mg/dL (ref 8.9–10.3)
Chloride: 97 mmol/L — ABNORMAL LOW (ref 98–111)
Chloride: 98 mmol/L (ref 98–111)
Creatinine, Ser: 1.05 mg/dL — ABNORMAL HIGH (ref 0.44–1.00)
Creatinine, Ser: 1.17 mg/dL — ABNORMAL HIGH (ref 0.44–1.00)
GFR, Estimated: >60 mL/min (ref >60)
GFR, Estimated: 55 mL/min — ABNORMAL LOW (ref >60)
Glucose, Bld: 98 mg/dL (ref 70–99)
Glucose, Bld: 79 mg/dL (ref 70–99)
Potassium: 4.4 mmol/L (ref 3.5–5.1)
Potassium: 2.8 mmol/L — ABNORMAL LOW (ref 3.5–5.1)
Sodium: 137 mmol/L (ref 135–145)
Sodium: 138 mmol/L (ref 135–145)
Total Bilirubin: 1 mg/dL (ref 0.3–1.2)
Total Bilirubin: 1.1 mg/dL (ref 0.3–1.2)
Total Protein: 7.8 g/dL (ref 6.5–8.1)
Total Protein: 6.7 g/dL (ref 6.5–8.1)

## 2022-11-06 LAB — CBC WITH DIFFERENTIAL/PLATELET
Abs Immature Granulocytes: 0.01 10*3/uL (ref 0.00–0.07)
Abs Immature Granulocytes: 0.01 10*3/uL (ref 0.00–0.07)
Basophils Absolute: 0 10*3/uL (ref 0.0–0.1)
Basophils Absolute: 0 10*3/uL (ref 0.0–0.1)
Basophils Relative: 0 %
Basophils Relative: 0 %
Eosinophils Absolute: 0.2 10*3/uL (ref 0.0–0.5)
Eosinophils Absolute: 0.2 10*3/uL (ref 0.0–0.5)
Eosinophils Relative: 3 %
Eosinophils Relative: 4 %
HCT: 40.1 % (ref 36.0–46.0)
HCT: 37.4 % (ref 36.0–46.0)
Hemoglobin: 13.2 g/dL (ref 12.0–15.0)
Hemoglobin: 12.5 g/dL (ref 12.0–15.0)
Immature Granulocytes: 0 %
Immature Granulocytes: 0 %
Lymphocytes Relative: 46 %
Lymphocytes Relative: 44 %
Lymphs Abs: 3.3 10*3/uL (ref 0.7–4.0)
Lymphs Abs: 2 10*3/uL (ref 0.7–4.0)
MCH: 26.4 pg (ref 26.0–34.0)
MCH: 27.4 pg (ref 26.0–34.0)
MCHC: 32.9 g/dL (ref 30.0–36.0)
MCHC: 33.4 g/dL (ref 30.0–36.0)
MCV: 80.2 fL (ref 80.0–100.0)
MCV: 81.8 fL (ref 80.0–100.0)
Monocytes Absolute: 0.4 10*3/uL (ref 0.1–1.0)
Monocytes Absolute: 0.3 10*3/uL (ref 0.1–1.0)
Monocytes Relative: 6 %
Monocytes Relative: 6 %
Neutro Abs: 3.2 10*3/uL (ref 1.7–7.7)
Neutro Abs: 2.1 10*3/uL (ref 1.7–7.7)
Neutrophils Relative %: 45 %
Neutrophils Relative %: 46 %
Platelets: 222 10*3/uL (ref 150–400)
Platelets: 206 10*3/uL (ref 150–400)
RBC: 5 MIL/uL (ref 3.87–5.11)
RBC: 4.57 MIL/uL (ref 3.87–5.11)
RDW: 15.1 % (ref 11.5–15.5)
RDW: 15 % (ref 11.5–15.5)
WBC: 7.2 10*3/uL (ref 4.0–10.5)
WBC: 4.5 10*3/uL (ref 4.0–10.5)
nRBC: 0 % (ref 0.0–0.2)
nRBC: 0 % (ref 0.0–0.2)

## 2022-11-06 LAB — HIV ANTIBODY (ROUTINE TESTING W REFLEX): HIV Screen 4th Generation wRfx: NONREACTIVE

## 2022-11-06 LAB — GLUCOSE, CAPILLARY
Glucose-Capillary: 71 mg/dL (ref 70–99)
Glucose-Capillary: 95 mg/dL (ref 70–99)
Glucose-Capillary: 69 mg/dL — ABNORMAL LOW (ref 70–99)
Glucose-Capillary: 88 mg/dL (ref 70–99)

## 2022-11-06 LAB — LIPID PANEL
Cholesterol: 205 mg/dL — ABNORMAL HIGH (ref 0–200)
HDL: 80 mg/dL (ref >40)
LDL Cholesterol: 115 mg/dL — ABNORMAL HIGH (ref 0–99)
Total CHOL/HDL Ratio: 2.6 RATIO
Triglycerides: 51 mg/dL (ref <150)
VLDL: 10 mg/dL (ref 0–40)

## 2022-11-06 LAB — LIPASE, BLOOD
Lipase: 4614 U/L — ABNORMAL HIGH (ref 11–51)
Lipase: 1519 U/L — ABNORMAL HIGH (ref 11–51)

## 2022-11-06 LAB — CBG MONITORING, ED
Glucose-Capillary: 96 mg/dL (ref 70–99)
Glucose-Capillary: 88 mg/dL (ref 70–99)

## 2022-11-06 LAB — PHOSPHORUS: Phosphorus: 3.1 mg/dL (ref 2.5–4.6)

## 2022-11-06 LAB — MAGNESIUM: Magnesium: 1.9 mg/dL (ref 1.7–2.4)

## 2022-11-06 LAB — PREGNANCY, URINE: Preg Test, Ur: NEGATIVE

## 2022-11-06 MED ORDER — DEXTROSE-SODIUM CHLORIDE 5-0.9 % IV SOLN: INTRAVENOUS | Status: DC

## 2022-11-06 MED ORDER — HYDROMORPHONE HCL 1 MG/ML IJ SOLN
0.5000 mg | INTRAMUSCULAR | Status: DC | PRN
  Administered 2022-11-08: 0.5 mg via INTRAVENOUS
  Filled 2022-11-06: qty 1

## 2022-11-06 MED ORDER — HYDRALAZINE HCL 20 MG/ML IJ SOLN: 10.0000 mg | INTRAMUSCULAR | Status: DC | PRN

## 2022-11-06 MED ORDER — PIPERACILLIN-TAZOBACTAM 3.375 G IVPB
3.3750 g | Freq: Three times a day (TID) | INTRAVENOUS | Status: AC
  Administered 2022-11-06 – 2022-11-08 (×9): 3.375 g via INTRAVENOUS
  Filled 2022-11-06 (×8): qty 50

## 2022-11-06 MED ORDER — ONDANSETRON HCL 4 MG/2ML IJ SOLN: 4.0000 mg | Freq: Four times a day (QID) | INTRAMUSCULAR | Status: DC | PRN

## 2022-11-06 MED ORDER — POTASSIUM CHLORIDE IN NACL 20-0.9 MEQ/L-% IV SOLN
INTRAVENOUS | Status: DC
  Filled 2022-11-06 (×2): qty 1000

## 2022-11-06 MED ORDER — MORPHINE SULFATE (PF) 2 MG/ML IV SOLN: 2.0000 mg | INTRAVENOUS | Status: DC | PRN

## 2022-11-06 MED ORDER — PANTOPRAZOLE SODIUM 40 MG IV SOLR
40.0000 mg | INTRAVENOUS | Status: DC
  Administered 2022-11-06 – 2022-11-09 (×3): 40 mg via INTRAVENOUS
  Filled 2022-11-06 (×4): qty 10

## 2022-11-06 MED ORDER — ONDANSETRON HCL 4 MG/2ML IJ SOLN
4.0000 mg | Freq: Once | INTRAMUSCULAR | Status: AC
  Administered 2022-11-06: 4 mg via INTRAVENOUS
  Filled 2022-11-06: qty 2

## 2022-11-06 MED ORDER — SODIUM CHLORIDE 0.9 % IV SOLN
2.0000 g | Freq: Once | INTRAVENOUS | Status: AC
  Administered 2022-11-06: 2 g via INTRAVENOUS
  Filled 2022-11-06: qty 20

## 2022-11-06 MED ORDER — IOHEXOL 300 MG/ML  SOLN
100.0000 mL | Freq: Once | INTRAMUSCULAR | Status: AC | PRN
  Administered 2022-11-06: 100 mL via INTRAVENOUS

## 2022-11-06 MED ORDER — INSULIN ASPART 100 UNIT/ML IJ SOLN: 0.0000 [IU] | INTRAMUSCULAR | Status: DC

## 2022-11-06 MED ORDER — POTASSIUM CHLORIDE 20 MEQ PO PACK
40.0000 meq | PACK | ORAL | Status: AC
  Administered 2022-11-06 (×2): 40 meq via ORAL
  Filled 2022-11-06 (×2): qty 2

## 2022-11-06 MED ORDER — FENTANYL CITRATE PF 50 MCG/ML IJ SOSY
100.0000 ug | PREFILLED_SYRINGE | Freq: Once | INTRAMUSCULAR | Status: AC
  Administered 2022-11-06: 100 ug via INTRAVENOUS
  Filled 2022-11-06: qty 2

## 2022-11-06 MED ORDER — INSULIN ASPART 100 UNIT/ML IJ SOLN: 0.0000 [IU] | Freq: Three times a day (TID) | INTRAMUSCULAR | Status: DC

## 2022-11-06 MED ORDER — INSULIN ASPART 100 UNIT/ML IJ SOLN: 0.0000 [IU] | Freq: Every day | INTRAMUSCULAR | Status: DC

## 2022-11-06 MED ORDER — POTASSIUM CHLORIDE CRYS ER 20 MEQ PO TBCR: 40.0000 meq | EXTENDED_RELEASE_TABLET | ORAL | Status: DC

## 2022-11-06 MED ORDER — SODIUM CHLORIDE 0.9 % IV SOLN: INTRAVENOUS | Status: DC

## 2022-11-06 NOTE — H&P (Signed)
History and Physical    [name] VOZ:[id number] DOB: 1965/06/08 DOA: 11/06/2022  PCP: [place], Betty G, MD Patient coming from: Home.  Chief Complaint: Abdominal pain  HPI: [name] is a 57 year old F with PMH of NIDDM-2, HTN, HLD, morbid obesity and DDD presenting with acute abdominal pain.  Patient reports acute pain across upper abdomen radiating to her back after dinner about 7:30 PM.  Pain is now about 11 PM and she went to bed.  She woke up about 2 AM and did not have pain for a few seconds.  Then, she had severe 10/10 pain that prompted her to go to ED at [facility].  Describes the pain as cramping.  Rates her pain 10/10 before she went to ED.  Currently pain is down to 3/10.  She has 1 episode of nonbilious nonbloody emesis about 7:30 PM.  Denies fever, chills, chest pain, dyspnea or UTI symptoms.  Of note, she had similar but less intense abdominal pain when she was in [facility] about a week ago after she ate some beef.  She also reports similar pain about a year ago.  Patient denies smoking cigarettes, drinking alcohol or occasional drug use.  She denies new medications.  Likes to have cardiopulmonary resuscitation in an event of sudden cardiopulmonary arrest.  In ED, stable vitals.  CBC without significant finding.  Lipase is 4614.  AST 171.  ALT 125.  Total bili 1.0.  ALP 424. Cr 1.05.  BUN 23.  Bicarb 34.  AG 6.  CT abdomen and pelvis raise concern for cholelithiasis with possible acute cholecystitis but no radiologic evidence of acute pancreatitis.  On-call surgeon consulted by EDP and recommended admission and IV antibiotic to see patient in the morning.  Started on IV fluid, IV analgesics, antiemetics and IV Zosyn.  Admission accepted by overnight admitted.    ROS All review of system negative except for pertinent positives and negatives as history of present illness above.  PMH Past Medical History:  Diagnosis Date   Anemia    Back pain    Clotting disorder  (HCC)    Diabetes mellitus without complication (HCC)    Edema 04/25/2015   Headache(784.0)    Herniated disc    History of colonic polyps    Hypertension    Joint pain    Obesity    Osteoarthritis    TIA (transient ischemic attack) 2007   hx of   Vasculitis (HCC)    L leg - Polyarthritis   Venous insufficiency    Vitamin D deficiency    PSH Past Surgical History:  Procedure Laterality Date   ABDOMINAL HYSTERECTOMY     LAPAROSCOPIC GASTRIC SLEEVE RESECTION N/A 08/11/2016   Procedure: LAPAROSCOPIC GASTRIC SLEEVE RESECTION, UPPER ENDOSCOPY;  Surgeon: De Blanch Kinsinger, MD;  Location: [facility];  Service: General;  Laterality: N/A;   ORIF TIBIA PLATEAU Right 10/15/2021   Procedure: OPEN REDUCTION INTERNAL FIXATION (ORIF) RIGHT LATERAL TIBIAL PLATEAU FRACTURE, ALLOGRAFT BONE CHIPS;  Surgeon: Eldred Manges, MD;  Location: [facility];  Service: Orthopedics;  Laterality: Right;   Fam HX Family History  Problem Relation Age of Onset   Stroke Mother    Hypertension Mother    AAA (abdominal aortic aneurysm) Mother    Hypertension Father    Cancer Father        Prostate   Arthritis Other    Hypertension Other    Stroke Other    Colon cancer Neg Hx    Esophageal cancer  Neg Hx    Rectal cancer Neg Hx    Stomach cancer Neg Hx     Social Hx  reports that she has never smoked. She has never used smokeless tobacco. She reports current alcohol use. She reports that she does not use drugs.  Allergy No Known Allergies Home Meds Prior to Admission medications   Medication Sig Start Date End Date Taking? Authorizing Provider  acetaminophen (TYLENOL) 500 MG tablet Take 1,000 mg by mouth every 8 (eight) hours.   Yes [provider]  carvedilol (COREG) 3.125 MG tablet Take 1 tablet (3.125 mg total) by mouth 2 (two) times daily with a meal. 02/11/22  Yes [place], Betty G, MD  ezetimibe (ZETIA) 10 MG tablet Take 1 tablet (10 mg total) by mouth daily. 08/28/22  Yes [place], Betty G, MD   ibuprofen (ADVIL) 600 MG tablet Take 600 mg by mouth every 6 (six) hours as needed for mild pain.   Yes [provider]  indapamide (LOZOL) 1.25 MG tablet Take 1 tablet (1.25 mg total) by mouth daily. 02/11/22  Yes [place], Betty G, MD  meclizine (ANTIVERT) 25 MG tablet Take 1 tablet (25 mg total) by mouth daily as needed for dizziness. 08/28/22  Yes [place], Betty G, MD  Semaglutide,0.25 or 0.5MG /DOS, (OZEMPIC, 0.25 OR 0.5 MG/DOSE,) 2 MG/3ML SOPN Inject 0.5 mg into the skin once a week. 04/22/22  Yes [place], Betty G, MD  tiZANidine (ZANAFLEX) 2 MG tablet Take 1 tablet (2 mg total) by mouth 2 (two) times daily as needed for muscle spasms. 08/28/22  Yes [place], Betty G, MD  Blood Glucose Monitoring Suppl (ACCU-CHEK AVIVA PLUS) w/Device KIT As directed. 08/28/22   [place], Betty G, MD    Physical Exam: Vitals:   11/06/22 7829 11/06/22 0446 11/06/22 0905 11/06/22 1025  BP:  131/88  132/73  Pulse:  64  62  Resp:  18  18  Temp:  97.7 F (36.5 C) 97.6 F (36.4 C) 97.9 F (36.6 C)  TempSrc:  Oral Oral Oral  SpO2: 100% 100%  100%  Weight:      Height:        GENERAL: No acute distress.  Appears well.  HEENT: MMM.  Vision and hearing grossly intact.  NECK: Supple.  No apparent JVD.  RESP:  No IWOB. Good air movement bilaterally. CVS:  RRR. Heart sounds normal.  ABD/GI/GU: BS+, tenderness across upper abdomen. MSK/EXT:  Moves extremities. No apparent deformity or edema.  SKIN: no apparent skin lesion or wound NEURO: Awake, alert and oriented appropriately.  No gross deficit.  PSYCH: Calm. Normal affect.   Personally Reviewed Radiological Exams See HPI   Personally Reviewed Labs: CBC: Recent Labs  Lab 11/06/22 0330  WBC 7.2  NEUTROABS 3.2  HGB 13.2  HCT 40.1  MCV 80.2  PLT 222   Basic Metabolic Panel: Recent Labs  Lab 11/06/22 0330  NA 137  K 4.4  CL 97*  CO2 34*  GLUCOSE 98  BUN 23*  CREATININE 1.05*  CALCIUM 9.6   GFR: Estimated Creatinine Clearance: 67.3  mL/min (A) (by C-G formula based on SCr of 1.05 mg/dL (H)). Liver Function Tests: Recent Labs  Lab 11/06/22 0330  AST 171*  ALT 125*  ALKPHOS 424*  BILITOT 1.0  PROT 7.8  ALBUMIN 4.0   Recent Labs  Lab 11/06/22 0330  LIPASE 4,614*   No results for input(s): "AMMONIA" in the last 168 hours. Coagulation Profile: No results for input(s): "INR", "PROTIME" in the last  168 hours. Cardiac Enzymes: No results for input(s): "CKTOTAL", "CKMB", "CKMBINDEX", "TROPONINI" in the last 168 hours. BNP (last 3 results) No results for input(s): "PROBNP" in the last 8760 hours. HbA1C: No results for input(s): "HGBA1C" in the last 72 hours. CBG: Recent Labs  Lab 11/06/22 0654 11/06/22 0828 11/06/22 1159  GLUCAP 96 88 71   Lipid Profile: Recent Labs    11/06/22 0330  CHOL 205*  HDL 80  LDLCALC 115*  TRIG 51  CHOLHDL 2.6   Thyroid Function Tests: No results for input(s): "TSH", "T4TOTAL", "FREET4", "T3FREE", "THYROIDAB" in the last 72 hours. Anemia Panel: No results for input(s): "VITAMINB12", "FOLATE", "FERRITIN", "TIBC", "IRON", "RETICCTPCT" in the last 72 hours. Urine analysis:    Component Value Date/Time   COLORURINE STRAW (A) 01/28/2021 1058   APPEARANCEUR CLEAR 01/28/2021 1058   LABSPEC 1.009 01/28/2021 1058   PHURINE 7.0 01/28/2021 1058   GLUCOSEU NEGATIVE 01/28/2021 1058   GLUCOSEU NEGATIVE 05/11/2019 1634   HGBUR SMALL (A) 01/28/2021 1058   BILIRUBINUR NEGATIVE 01/28/2021 1058   KETONESUR NEGATIVE 01/28/2021 1058   PROTEINUR NEGATIVE 01/28/2021 1058   UROBILINOGEN 1.0 05/11/2019 1634   NITRITE NEGATIVE 01/28/2021 1058   LEUKOCYTESUR NEGATIVE 01/28/2021 1058     Assessment and plan:  Principal Problem:   Acute pancreatitis Active Problems:   Essential hypertension, benign   Diabetes mellitus type II, non insulin dependent (HCC)   Morbid obesity (HCC)   Elevated liver enzymes   Acute pancreatitis: Likely related to gallstone.  Presents with acute  abdominal pain radiating to her back.  Lipase elevated to 4614.  CT abdomen and pelvis with cholelithiasis and possible acute cholecystitis but no radiologic evidence of acute pancreatitis.  She has no leukocytosis or fever.  She has mildly elevated LFT as well.  Pain improved.  Probably passed some stone.  She denies drinking alcohol. -IV fluid.  Added D5 to IV fluid while NPO. -IV Dilaudid for pain control -IV Zofran and Protonix -Check CMP, lipase -Follow RUQ ultrasound -Continue IV Zosyn for now although low suspicion for infection. -General surgery has been notified for about patient's arrival. -Check pregnancy test.  Elevated liver enzymes: Likely due to the above. Recent Labs  Lab 11/06/22 0330  AST 171*  ALT 125*  ALKPHOS 424*  BILITOT 1.0  PROT 7.8  ALBUMIN 4.0  -Follow RUQ ultrasound -Continue trending   Controlled non-insulin-dependent diabetes: On Ozempic at home.  A1c 5.6% in 08/2022. Recent Labs  Lab 11/06/22 0654 11/06/22 0828 11/06/22 1159  GLUCAP 96 88 71  -SSI-moderate while on IV fluid with D5  Essential hypertension: Normotensive.  On low-dose Coreg at home. -IV hydralazine as needed  Morbid obesity: BMI 35.53 with comorbidity as above.  History of sleeve gastrectomy. -Encourage lifestyle change to lose weight   DVT prophylaxis: SCD  Code Status: Full code Family Communication: Updated patient's aunt at bedside  Consults called: General surgery Admission status: Inpatient Severity of Illness: The appropriate patient status for this patient is INPATIENT. Inpatient status is judged to be reasonable and necessary in order to provide the required intensity of service to ensure the patient's safety. The patient's presenting symptoms, physical exam findings, and initial radiographic and laboratory data in the context of their chronic comorbidities is felt to place them at high risk for further clinical deterioration. Furthermore, it is not anticipated that  the patient will be medically stable for discharge from the hospital within 2 midnights of admission. The following factors support the patient status of inpatient.  Almon Hercules MD [facility]  If 7PM-7AM, please contact night-coverage [email or web address]  11/06/2022, 12:40 PM

## 2022-11-06 NOTE — ED Triage Notes (Signed)
Mid abdominal pain sharp with nausea and vomiting with burps, stated yesterday around 5pm. Denies diarrhea, no fevers

## 2022-11-06 NOTE — ED Notes (Signed)
Attempted to call report; was told to call back

## 2022-11-06 NOTE — ED Notes (Signed)
Pt transport to CT  

## 2022-11-06 NOTE — ED Notes (Signed)
Kiana with cl called for transport 

## 2022-11-06 NOTE — Consult Note (Signed)
Reason for Consult:gsp Referring Physician: Dr Deanne Coffer is an 57 y.o. female.  HPI: [name] who has prior lap sleeve and known gallstones.  She has history of dm, on ozempic took last Monday who has ab pain. One prior episode on vacation last week that resolved. Started having epigastric pain, ruq to back last night, nothing making it better, some nausea, no emesis. No fever.  Underwent evaluation that shows nl wbc, lipase 4614, transaminases mildly elevated, Cr 1.05.  ct scan with question of cholecystitis.  She complains of ab pain when I see her  Past Medical History:  Diagnosis Date   Anemia    Back pain    Clotting disorder (HCC)    Diabetes mellitus without complication (HCC)    Edema 04/25/2015   Headache(784.0)    Herniated disc    History of colonic polyps    Hypertension    Joint pain    Obesity    Osteoarthritis    TIA (transient ischemic attack) 2007   hx of   Vasculitis (HCC)    L leg - Polyarthritis   Venous insufficiency    Vitamin D deficiency     Past Surgical History:  Procedure Laterality Date   ABDOMINAL HYSTERECTOMY     LAPAROSCOPIC GASTRIC SLEEVE RESECTION N/A 08/11/2016   Procedure: LAPAROSCOPIC GASTRIC SLEEVE RESECTION, UPPER ENDOSCOPY;  Surgeon: De Blanch Kinsinger, MD;  Location: [facility];  Service: General;  Laterality: N/A;   ORIF TIBIA PLATEAU Right 10/15/2021   Procedure: OPEN REDUCTION INTERNAL FIXATION (ORIF) RIGHT LATERAL TIBIAL PLATEAU FRACTURE, ALLOGRAFT BONE CHIPS;  Surgeon: Eldred Manges, MD;  Location: [facility];  Service: Orthopedics;  Laterality: Right;    Family History  Problem Relation Age of Onset   Stroke Mother    Hypertension Mother    AAA (abdominal aortic aneurysm) Mother    Hypertension Father    Cancer Father        Prostate   Arthritis Other    Hypertension Other    Stroke Other    Colon cancer Neg Hx    Esophageal cancer Neg Hx    Rectal cancer Neg Hx    Stomach cancer Neg Hx     Social History:  reports  that she has never smoked. She has never used smokeless tobacco. She reports current alcohol use. She reports that she does not use drugs.  Allergies: No Known Allergies  Current Facility-Administered Medications  Medication Dose Route Frequency Provider Last Rate Last Admin   dextrose 5 %-0.9 % sodium chloride infusion   Intravenous Continuous Gonfa, Taye T, MD       hydrALAZINE (APRESOLINE) injection 10 mg  10 mg Intravenous Q4H PRN Gonfa, Taye T, MD       HYDROmorphone (DILAUDID) injection 0.5 mg  0.5 mg Intravenous Q2H PRN Gonfa, Taye T, MD       insulin aspart (novoLOG) injection 0-15 Units  0-15 Units Subcutaneous Q4H Crosley, Debby, MD       insulin aspart (novoLOG) injection 0-5 Units  0-5 Units Subcutaneous QHS Gonfa, Taye T, MD       ondansetron (ZOFRAN) injection 4 mg  4 mg Intravenous Q6H PRN Crosley, Debby, MD       ondansetron (ZOFRAN) injection 4 mg  4 mg Intravenous Q6H PRN Gonfa, Taye T, MD       pantoprazole (PROTONIX) injection 40 mg  40 mg Intravenous Q24H Gonfa, Boyce Medici, MD       piperacillin-tazobactam (ZOSYN) IVPB  3.375 g  3.375 g Intravenous Q8H Crosley, Debby, MD 12.5 mL/hr at 11/06/22 0802 3.375 g at 11/06/22 0802     Results for orders placed or performed during the hospital encounter of 11/06/22 (from the past 48 hour(s))  CBC with Differential     Status: None   Collection Time: 11/06/22  3:30 AM  Result Value Ref Range   WBC 7.2 4.0 - 10.5 K/uL   RBC 5.00 3.87 - 5.11 MIL/uL   Hemoglobin 13.2 12.0 - 15.0 g/dL   HCT 16.1 09.6 - 04.5 %   MCV 80.2 80.0 - 100.0 fL   MCH 26.4 26.0 - 34.0 pg   MCHC 32.9 30.0 - 36.0 g/dL   RDW 40.9 81.1 - 91.4 %   Platelets 222 150 - 400 K/uL   nRBC 0.0 0.0 - 0.2 %   Neutrophils Relative % 45 %   Neutro Abs 3.2 1.7 - 7.7 K/uL   Lymphocytes Relative 46 %   Lymphs Abs 3.3 0.7 - 4.0 K/uL   Monocytes Relative 6 %   Monocytes Absolute 0.4 0.1 - 1.0 K/uL   Eosinophils Relative 3 %   Eosinophils Absolute 0.2 0.0 - 0.5 K/uL    Basophils Relative 0 %   Basophils Absolute 0.0 0.0 - 0.1 K/uL   Immature Granulocytes 0 %   Abs Immature Granulocytes 0.01 0.00 - 0.07 K/uL    Comment: Performed at [facility], [address] [place], [place] [address]  Comprehensive metabolic panel     Status: Abnormal   Collection Time: 11/06/22  3:30 AM  Result Value Ref Range   Sodium 137 135 - 145 mmol/L   Potassium 4.4 3.5 - 5.1 mmol/L   Chloride 97 (L) 98 - 111 mmol/L   CO2 34 (H) 22 - 32 mmol/L   Glucose, Bld 98 70 - 99 mg/dL    Comment: Glucose reference range applies only to samples taken after fasting for at least 8 hours.   BUN 23 (H) 6 - 20 mg/dL   Creatinine, Ser 6.21 (H) 0.44 - 1.00 mg/dL   Calcium 9.6 8.9 - 30.8 mg/dL   Total Protein 7.8 6.5 - 8.1 g/dL   Albumin 4.0 3.5 - 5.0 g/dL   AST 657 (H) 15 - 41 U/L   ALT 125 (H) 0 - 44 U/L   Alkaline Phosphatase 424 (H) 38 - 126 U/L   Total Bilirubin 1.0 0.3 - 1.2 mg/dL   GFR, Estimated >84 >69 mL/min    Comment: (NOTE) Calculated using the CKD-EPI Creatinine Equation (2021)    Anion gap 6 5 - 15    Comment: Performed at [facility], [address] [place], [place] [address]  Lipase, blood     Status: Abnormal   Collection Time: 11/06/22  3:30 AM  Result Value Ref Range   Lipase 4,614 (H) 11 - 51 U/L    Comment: Performed at [facility], [address], [place], [place] [address]  Lipid panel     Status: Abnormal   Collection Time: 11/06/22  3:30 AM  Result Value Ref Range   Cholesterol 205 (H) 0 - 200 mg/dL   Triglycerides 51 <440 mg/dL   HDL 80 >10 mg/dL   Total CHOL/HDL Ratio 2.6 RATIO   VLDL 10 0 - 40 mg/dL   LDL Cholesterol 272 (H) 0 - 99 mg/dL    Comment:        Total Cholesterol/HDL:CHD Risk Coronary Heart Disease Risk Table  Men   Women  1/2 Average Risk   3.4   3.3  Average Risk       5.0   4.4  2 X Average Risk   9.6   7.1  3 X Average Risk  23.4   11.0        Use  the calculated Patient Ratio above and the CHD Risk Table to determine the patient's CHD Risk.        ATP III CLASSIFICATION (LDL):  <100     mg/dL   Optimal  161-096  mg/dL   Near or Above                    Optimal  130-159  mg/dL   Borderline  045-409  mg/dL   High  >811     mg/dL   Very High Performed at [facility], [address] [place], [place] [address]   CBG monitoring, ED     Status: None   Collection Time: 11/06/22  6:54 AM  Result Value Ref Range   Glucose-Capillary 96 70 - 99 mg/dL    Comment: Glucose reference range applies only to samples taken after fasting for at least 8 hours.  CBG monitoring, ED     Status: None   Collection Time: 11/06/22  8:28 AM  Result Value Ref Range   Glucose-Capillary 88 70 - 99 mg/dL    Comment: Glucose reference range applies only to samples taken after fasting for at least 8 hours.  Glucose, capillary     Status: None   Collection Time: 11/06/22 11:59 AM  Result Value Ref Range   Glucose-Capillary 71 70 - 99 mg/dL    Comment: Glucose reference range applies only to samples taken after fasting for at least 8 hours.  CBC with Differential/Platelet     Status: None   Collection Time: 11/06/22 12:29 PM  Result Value Ref Range   WBC 4.5 4.0 - 10.5 K/uL   RBC 4.57 3.87 - 5.11 MIL/uL   Hemoglobin 12.5 12.0 - 15.0 g/dL   HCT 29.5 62.1 - 30.8 %   MCV 81.8 80.0 - 100.0 fL   MCH 27.4 26.0 - 34.0 pg   MCHC 33.4 30.0 - 36.0 g/dL   RDW 65.7 84.6 - 96.2 %   Platelets 206 150 - 400 K/uL   nRBC 0.0 0.0 - 0.2 %   Neutrophils Relative % 46 %   Neutro Abs 2.1 1.7 - 7.7 K/uL   Lymphocytes Relative 44 %   Lymphs Abs 2.0 0.7 - 4.0 K/uL   Monocytes Relative 6 %   Monocytes Absolute 0.3 0.1 - 1.0 K/uL   Eosinophils Relative 4 %   Eosinophils Absolute 0.2 0.0 - 0.5 K/uL   Basophils Relative 0 %   Basophils Absolute 0.0 0.0 - 0.1 K/uL   Immature Granulocytes 0 %   Abs Immature Granulocytes 0.01 0.00 - 0.07 K/uL    Comment: Performed at  [facility], [address], [place], [place] [address]    CT ABDOMEN PELVIS W CONTRAST  Result Date: 11/06/2022 CLINICAL DATA:  Pancreatitis suspected. Severe abdominal pain. History of gallstones. EXAM: CT ABDOMEN AND PELVIS WITH CONTRAST TECHNIQUE: Multidetector CT imaging of the abdomen and pelvis was performed using the standard protocol following bolus administration of intravenous contrast. RADIATION DOSE REDUCTION: This exam was performed according to the departmental dose-optimization program which includes automated exposure control, adjustment of the mA and/or kV according to patient  size and/or use of iterative reconstruction technique. CONTRAST:  OMNIPAQUE IOHEXOL 300 MG/ML  SOLN COMPARISON:  Only relevant CT comparison is CT of pelvis with IV and oral contrast 08/08/2014. There is also a right upper quadrant ultrasound available from 08/19/2020. FINDINGS: Lower chest: Mild elevation of the right hemidiaphragm. Lung bases are clear. The cardiac size is normal. Hepatobiliary: The liver slightly steatotic with homogeneous enhancement. No mass is seen. There is mild dilatation of the gallbladder. There is a least 1 intraluminal stone, measuring 1.8 cm. Possible there could be others but this is the only stone which has a calcified rim. Some images appear to show slight thickening of the gallbladder wall over portions and trace pericholecystic fluid. Findings are concerning for cholecystitis. Pancreas: There are no inflammatory changes, mass enhancement or ductal dilatation. Spleen: There is a hypodense 1 cm lesion in the medial aspect of the spleen, Hounsfield density is 25 finding is indeterminate by CT density. Rest of the spleen is homogeneous. Adrenals/Urinary Tract: There is no adrenal mass. Both kidneys demonstrate a few scattered small subcentimeter hypodensities which are too small to characterize but statistically most likely representing cysts. No follow-up imaging is  recommended [facility] 2018 Feb; 264-273, Management of the Incidental Renal Mass on CT, [employer] 2021; 814-848, Bosniak Classification of Cystic Renal Masses, Version 2019). There is no urinary stone or obstruction. The bladder unremarkable for the degree of distention. Stomach/Bowel: Evidence of prior sleeve gastrectomy. The gastric wall otherwise unremarkable. No small bowel obstruction or inflammatory changes are seen. The normal appendix is well visible. There is scattered uncomplicated colonic diverticulosis. Vascular/Lymphatic: No significant vascular findings are present. No enlarged abdominal or pelvic lymph nodes. Multiple pelvic phleboliths. Reproductive: Status post hysterectomy. No adnexal masses. Other: No abdominal wall hernia or abnormality. No abdominopelvic ascites. No acute inflammatory process. Musculoskeletal: Moderate lower lumbar facet hypertrophy. Mild symmetric sacroiliitis. IMPRESSION: 1. Cholelithiasis with CT findings concerning for cholecystitis. Ultrasound may be helpful to confirm. 2. Mild hepatic steatosis. 3. 1 cm indeterminate hypodense lesion in the medial spleen. Statistically this is most likely a benign lesion such as a hemangioma or lymphangioma. 4. Diverticulosis without evidence of diverticulitis. 5. Mild symmetric sacroiliitis, unchanged compared to the 2016 pelvic CT. Electronically Signed   By: Almira Bar M.D.   On: 11/06/2022 05:54    Review of Systems  Constitutional:  Negative for fever.  Gastrointestinal:  Positive for abdominal pain and nausea. Negative for constipation and vomiting.  All other systems reviewed and are negative.  Blood pressure 132/73, pulse 62, temperature 97.9 F (36.6 C), temperature source Oral, resp. rate 18, height 5\' 4"  (1.626 m), weight 93.9 kg, SpO2 100%. Physical Exam Vitals reviewed.  Constitutional:      Appearance: She is well-developed.  Eyes:     General: No scleral icterus.    Pupils: Pupils are equal, round, and  reactive to light.  Cardiovascular:     Rate and Rhythm: Normal rate and regular rhythm.  Pulmonary:     Effort: Pulmonary effort is normal.     Breath sounds: No wheezing.  Abdominal:     General: Bowel sounds are normal. There is no distension.     Palpations: Abdomen is soft.     Tenderness: There is abdominal tenderness in the epigastric area.     Hernia: No hernia is present.  Skin:    General: Skin is warm and dry.     Capillary Refill: Capillary refill takes less than 2 seconds.  Neurological:  General: No focal deficit present.     Mental Status: She is alert.  Psychiatric:        Mood and Affect: Mood normal.        Behavior: Behavior normal.     Assessment/Plan: GSP -[place] pending -can have clears -discussed will wait for resolution of pancreatitis then proceed with lap chole -dvt prophylaxis recommended   I personally reviewed images of ct scan showing gallstones. I personally discussed this patient's care with Dr Alanda Slim about surgery. I reviewed recent lab values, vitals, and notes.  This care required moderate level of medical decision making.   Emelia Loron 11/06/2022, 1:30 PM

## 2022-11-06 NOTE — Plan of Care (Signed)
  Problem: Skin Integrity: Goal: Risk for impaired skin integrity will decrease Outcome: Completed/Met

## 2022-11-06 NOTE — ED Provider Notes (Signed)
[name]GENCY DEPARTMENT AT [facility] Provider Note   CSN: [id number] Arrival date & time: 11/06/22  6387     History  Chief Complaint  Patient presents with   Abdominal Pain    Mid abdominal pain sharp with nausea and vomiting stated yesterday around 5pm. Denies diarrhea, no fevers    [name] is a 57 y.o. female.  The history is provided by the patient and the spouse.  Patient with extensive history including diabetes, hypertension, obesity presents with abdominal pain.  Patient reports over a week ago she had an episode of sharp upper abdominal pain with nausea that resolved spontaneously.  It lasted throughout the day.  This occurred while she was vacationing in [place].  She only drinks occasional wine.  Last evening she had onset of the same pain in the upper abdomen that radiates to her back.  She reports associated belching with nausea and nonbloody emesis.  No diarrhea.  No chest pain or shortness of breath.  She reports known history of cholelithiasis, but no previous abdominal surgeries    Past Medical History:  Diagnosis Date   Anemia    Back pain    Clotting disorder (HCC)    Diabetes mellitus without complication (HCC)    Edema 04/25/2015   Headache(784.0)    Herniated disc    History of colonic polyps    Hypertension    Joint pain    Obesity    Osteoarthritis    TIA (transient ischemic attack) 2007   hx of   Vasculitis (HCC)    L leg - Polyarthritis   Venous insufficiency    Vitamin D deficiency     Home Medications Prior to Admission medications   Medication Sig Start Date End Date Taking? Authorizing Provider  acetaminophen (TYLENOL) 500 MG tablet Take 1,000 mg by mouth every 8 (eight) hours.    [provider]  Blood Glucose Monitoring Suppl (ACCU-CHEK AVIVA PLUS) w/Device KIT As directed. 08/28/22   [place], Betty G, MD  carvedilol (COREG) 3.125 MG tablet Take 1 tablet (3.125 mg total) by mouth 2 (two)  times daily with a meal. 02/11/22   [place], Betty G, MD  ezetimibe (ZETIA) 10 MG tablet Take 1 tablet (10 mg total) by mouth daily. 08/28/22   [place], Betty G, MD  indapamide (LOZOL) 1.25 MG tablet Take 1 tablet (1.25 mg total) by mouth daily. 02/11/22   [place], Betty G, MD  meclizine (ANTIVERT) 25 MG tablet Take 1 tablet (25 mg total) by mouth daily as needed for dizziness. 08/28/22   [place], Betty G, MD  Semaglutide,0.25 or 0.5MG /DOS, (OZEMPIC, 0.25 OR 0.5 MG/DOSE,) 2 MG/3ML SOPN Inject 0.5 mg into the skin once a week. 04/22/22   [place], Betty G, MD  tiZANidine (ZANAFLEX) 2 MG tablet Take 1 tablet (2 mg total) by mouth 2 (two) times daily as needed for muscle spasms. 08/28/22   [place], Betty G, MD      Allergies    Patient has no known allergies.    Review of Systems   Review of Systems  Constitutional:  Negative for fever.  Respiratory:  Negative for shortness of breath.   Cardiovascular:  Negative for chest pain.  Gastrointestinal:  Positive for abdominal pain, nausea and vomiting. Negative for blood in stool.  Genitourinary:  Negative for dysuria.    Physical Exam Updated Vital Signs BP 131/88 (BP Location: Left Arm)   Pulse 64   Temp 97.7 F (36.5 C) (Oral)  Resp 18   Ht 1.626 m (5\' 4" )   Wt 93.9 kg   SpO2 100%   BMI 35.53 kg/m  Physical Exam CONSTITUTIONAL: Well developed/well nourished HEAD: Normocephalic/atraumatic EYES: EOMI/PERRL, no icterus ENMT: Mucous membranes moist NECK: supple no meningeal signs CV: S1/S2 noted, no murmurs/rubs/gallops noted LUNGS: Lungs are clear to auscultation bilaterally, no apparent distress ABDOMEN: soft, moderate epigastric tenderness, no rebound or guarding, bowel sounds noted throughout abdomen Obese NEURO: Pt is awake/alert/appropriate, moves all extremitiesx4.  No facial droop.   EXTREMITIES: pulses normal/equal, full ROM SKIN: warm, color normal PSYCH: no abnormalities of mood noted, alert and oriented to situation  ED Results /  Procedures / Treatments   Labs (all labs ordered are listed, but only abnormal results are displayed) Labs Reviewed  COMPREHENSIVE METABOLIC PANEL - Abnormal; Notable for the following components:      Result Value   Chloride 97 (*)    CO2 34 (*)    BUN 23 (*)    Creatinine, Ser 1.05 (*)    AST 171 (*)    ALT 125 (*)    Alkaline Phosphatase 424 (*)    All other components within normal limits  LIPASE, BLOOD - Abnormal; Notable for the following components:   Lipase 4,614 (*)    All other components within normal limits  CBC WITH DIFFERENTIAL/PLATELET    EKG EKG Interpretation Date/Time:  Friday November 06 2022 03:36:55 EDT Ventricular Rate:  66 PR Interval:  174 QRS Duration:  98 QT Interval:  403 QTC Calculation: 423 R Axis:   -8  Text Interpretation: Sinus rhythm Low voltage, precordial leads No significant change since last tracing Confirmed by Zadie Rhine ([id number]) on 11/06/2022 3:49:28 AM  Radiology CT ABDOMEN PELVIS W CONTRAST  Result Date: 11/06/2022 CLINICAL DATA:  Pancreatitis suspected. Severe abdominal pain. History of gallstones. EXAM: CT ABDOMEN AND PELVIS WITH CONTRAST TECHNIQUE: Multidetector CT imaging of the abdomen and pelvis was performed using the standard protocol following bolus administration of intravenous contrast. RADIATION DOSE REDUCTION: This exam was performed according to the departmental dose-optimization program which includes automated exposure control, adjustment of the mA and/or kV according to patient size and/or use of iterative reconstruction technique. CONTRAST:  OMNIPAQUE IOHEXOL 300 MG/ML  SOLN COMPARISON:  Only relevant CT comparison is CT of pelvis with IV and oral contrast 08/08/2014. There is also a right upper quadrant ultrasound available from 08/19/2020. FINDINGS: Lower chest: Mild elevation of the right hemidiaphragm. Lung bases are clear. The cardiac size is normal. Hepatobiliary: The liver slightly steatotic with homogeneous  enhancement. No mass is seen. There is mild dilatation of the gallbladder. There is a least 1 intraluminal stone, measuring 1.8 cm. Possible there could be others but this is the only stone which has a calcified rim. Some images appear to show slight thickening of the gallbladder wall over portions and trace pericholecystic fluid. Findings are concerning for cholecystitis. Pancreas: There are no inflammatory changes, mass enhancement or ductal dilatation. Spleen: There is a hypodense 1 cm lesion in the medial aspect of the spleen, Hounsfield density is 25 finding is indeterminate by CT density. Rest of the spleen is homogeneous. Adrenals/Urinary Tract: There is no adrenal mass. Both kidneys demonstrate a few scattered small subcentimeter hypodensities which are too small to characterize but statistically most likely representing cysts. No follow-up imaging is recommended [facility] 2018 Feb; 264-273, Management of the Incidental Renal Mass on CT, [employer] 2021; 814-848, Bosniak Classification of Cystic Renal Masses, Version 2019). There is no  urinary stone or obstruction. The bladder unremarkable for the degree of distention. Stomach/Bowel: Evidence of prior sleeve gastrectomy. The gastric wall otherwise unremarkable. No small bowel obstruction or inflammatory changes are seen. The normal appendix is well visible. There is scattered uncomplicated colonic diverticulosis. Vascular/Lymphatic: No significant vascular findings are present. No enlarged abdominal or pelvic lymph nodes. Multiple pelvic phleboliths. Reproductive: Status post hysterectomy. No adnexal masses. Other: No abdominal wall hernia or abnormality. No abdominopelvic ascites. No acute inflammatory process. Musculoskeletal: Moderate lower lumbar facet hypertrophy. Mild symmetric sacroiliitis. IMPRESSION: 1. Cholelithiasis with CT findings concerning for cholecystitis. Ultrasound may be helpful to confirm. 2. Mild hepatic steatosis. 3. 1 cm indeterminate  hypodense lesion in the medial spleen. Statistically this is most likely a benign lesion such as a hemangioma or lymphangioma. 4. Diverticulosis without evidence of diverticulitis. 5. Mild symmetric sacroiliitis, unchanged compared to the 2016 pelvic CT. Electronically Signed   By: Almira Bar M.D.   On: 11/06/2022 05:54    Procedures Procedures    Medications Ordered in ED Medications  cefTRIAXone (ROCEPHIN) 2 g in sodium chloride 0.9 % 100 mL IVPB (2 g Intravenous New Bag/Given 11/06/22 0645)  ondansetron (ZOFRAN) injection 4 mg (4 mg Intravenous Given 11/06/22 0333)  fentaNYL (SUBLIMAZE) injection 100 mcg (100 mcg Intravenous Given 11/06/22 0447)  iohexol (OMNIPAQUE) 300 MG/ML solution 100 mL (100 mLs Intravenous Contrast Given 11/06/22 0456)    ED Course/ Medical Decision Making/ A&P Clinical Course as of 11/06/22 0646  Fri Nov 06, 2022  0512 Lipase(!): 4,614 Pancreatitis [DW]  0512 ALT(!): 125 Transaminitis [DW]  0641 Patient presented with increasing upper abdominal pain that radiated to her back labs reveal pancreatitis.  Patient already had known gallstones.  CT imaging reveals likely cholecystitis may also have biliary pancreatitis [DW]  [phone] Discussed the case with Dr. Angelena Form with general surgery.  He recommends IV antibiotics, admission to medicine and general surgery will follow along in consultation.  Would recommend reconsulting general surgery once she arrives at the main hospital [DW]  0645 D/w dr Haroldine Laws for admission [DW]  0645 Plan to keep patient NPO.  She is overall feeling improved, not vomiting and is afebrile.  However she would likely worsen given findings on CT imaging or labs. [DW]    Clinical Course User Index [DW] Zadie Rhine, MD                             Medical Decision Making Amount and/or Complexity of Data Reviewed Labs: ordered. Decision-making details documented in ED Course. Radiology: ordered. ECG/medicine tests:  ordered.  Risk Prescription drug management. Decision regarding hospitalization.   This patient presents to the ED for concern of abdominal pain, this involves an extensive number of treatment options, and is a complaint that carries with it a high risk of complications and morbidity.  The differential diagnosis includes but is not limited to cholecystitis, cholelithiasis, pancreatitis, gastritis, peptic ulcer disease, appendicitis, bowel obstruction, bowel perforation, diverticulitis, AAA, ischemic bowel    Comorbidities that complicate the patient evaluation: Patient's presentation is complicated by their history of diabetes and obesity  Additional history obtained: Additional history obtained from significant other Records reviewed  previous records reveal cholelithiasis  Lab Tests: I Ordered, and personally interpreted labs.  The pertinent results include: Pancreatitis, transaminitis  Imaging Studies ordered: I ordered imaging studies including CT scan abdomen pelvis   I independently visualized and interpreted imaging which showed cholelithiasis with likely early cholecystitis  I agree with the radiologist interpretation   Medicines ordered and prescription drug management: I ordered medication including fentanyl for pain Reevaluation of the patient after these medicines showed that the patient    improved  Critical Interventions:   admission and IV antibiotics  Consultations Obtained: I requested consultation with the admitting physician [facility] and consultant surgery , and discussed  findings as well as pertinent plan - they recommend: Admit to medicine for IV antibiotic  Reevaluation: After the interventions noted above, I reevaluated the patient and found that they have :improved  Complexity of problems addressed: Patient's presentation is most consistent with  acute presentation with potential threat to life or bodily function  Disposition: After consideration of  the diagnostic results and the patient's response to treatment,  I feel that the patent would benefit from admission   .           Final Clinical Impression(s) / ED Diagnoses Final diagnoses:  Cholecystitis  Acute biliary pancreatitis without infection or necrosis    Rx / DC Orders ED Discharge Orders     None         Zadie Rhine, MD 11/06/22 [phone]

## 2022-11-06 NOTE — Progress Notes (Signed)
Hypoglycemic Event  CBG:   Latest Reference Range & Units 11/06/22 20:17  Glucose-Capillary 70 - 99 mg/dL 69 (L)  (L): Data is abnormally low  Treatment: 4 oz juice/soda  Symptoms: None  Follow-up CBG: Time:   CBG Result:  Latest Reference Range & Units 11/06/22 20:48  Glucose-Capillary 70 - 99 mg/dL 88    Possible Reasons for Event: Inadequate meal intake  Comments/MD notified:    Carmin Muskrat

## 2022-11-07 DIAGNOSIS — K819 Cholecystitis, unspecified: Secondary | ICD-10-CM

## 2022-11-07 DIAGNOSIS — E876 Hypokalemia: Secondary | ICD-10-CM | POA: Diagnosis not present

## 2022-11-07 LAB — GLUCOSE, CAPILLARY
Glucose-Capillary: 103 mg/dL — ABNORMAL HIGH (ref 70–99)
Glucose-Capillary: 109 mg/dL — ABNORMAL HIGH (ref 70–99)
Glucose-Capillary: 132 mg/dL — ABNORMAL HIGH (ref 70–99)
Glucose-Capillary: 68 mg/dL — ABNORMAL LOW (ref 70–99)
Glucose-Capillary: 70 mg/dL (ref 70–99)
Glucose-Capillary: 92 mg/dL (ref 70–99)
Glucose-Capillary: 95 mg/dL (ref 70–99)
Glucose-Capillary: 96 mg/dL (ref 70–99)

## 2022-11-07 MED ORDER — POTASSIUM CHLORIDE CRYS ER 20 MEQ PO TBCR
20.0000 meq | EXTENDED_RELEASE_TABLET | Freq: Once | ORAL | Status: AC
  Administered 2022-11-07: 20 meq via ORAL
  Filled 2022-11-07: qty 1

## 2022-11-07 MED ORDER — ENOXAPARIN SODIUM 40 MG/0.4ML IJ SOSY
40.0000 mg | PREFILLED_SYRINGE | Freq: Every day | INTRAMUSCULAR | Status: DC
  Administered 2022-11-07 – 2022-11-10 (×3): 40 mg via SUBCUTANEOUS
  Filled 2022-11-07 (×2): qty 0.4

## 2022-11-07 MED ORDER — KCL IN DEXTROSE-NACL 20-5-0.9 MEQ/L-%-% IV SOLN
INTRAVENOUS | Status: DC
  Filled 2022-11-07 (×5): qty 1000

## 2022-11-07 NOTE — Progress Notes (Signed)
Subjective/Chief Complaint: Feels much better back to normal, no n/v having bowel function, tol clears   Objective: Vital signs in last 24 hours: Temp:  [97.6 F (36.4 C)-98.2 F (36.8 C)] 97.7 F (36.5 C) (07/27 0430) Pulse Rate:  [57-71] 71 (07/27 0430) Resp:  [18-20] 20 (07/27 0430) BP: (97-132)/(60-73) 97/60 (07/27 0430) SpO2:  [100 %] 100 % (07/27 0430) Last BM Date : 11/04/22  Intake/Output from previous day: 07/26 0701 - 07/27 0700 In: 2179.8 [P.O.:120; I.V.:1824.3; IV Piggyback:235.5] Out: 0  Intake/Output this shift: No intake/output data recorded.  Ab soft minimally tender  Lab Results:  Recent Labs    11/06/22 0330 11/06/22 1229  WBC 7.2 4.5  HGB 13.2 12.5  HCT 40.1 37.4  PLT 222 206   BMET Recent Labs    11/06/22 1229 11/07/22 0138  NA 138 137  K 2.8* 3.4*  CL 98 104  CO2 30 25  GLUCOSE 79 96  BUN 18 12  CREATININE 1.17* 1.24*  CALCIUM 8.9 8.3*   PT/INR No results for input(s): "LABPROT", "INR" in the last 72 hours. ABG No results for input(s): "PHART", "HCO3" in the last 72 hours.  Invalid input(s): "PCO2", "PO2"  Studies/Results: US Abdomen Limited RUQ (LIVER/GB)  Result Date: 11/06/2022 CLINICAL DATA:  Acute cholecystitis.  Abdominal pain. EXAM: ULTRASOUND ABDOMEN LIMITED RIGHT UPPER QUADRANT COMPARISON:  CT earlier today FINDINGS: Gallbladder: Moderately distended. Shadowing 1.9 cm gallstone proximally. There is mild gallbladder wall thickening at 4 mm. Small amount of pericholecystic fluid. No sonographic Murphy sign noted by sonographer. Common bile duct: Diameter: 4-5 mm, normal. Liver: No focal lesion identified. Mildly increased in parenchymal echogenicity. Portal vein is patent on color Doppler imaging with normal direction of blood flow towards the liver. Other: None. IMPRESSION: 1. Moderately distended gallbladder with gallstone, wall thickening and pericholecystic fluid. Findings are consistent with acute cholecystitis in the  appropriate clinical setting. 2. Minimal hepatic steatosis. Acute Colles please pick the correct US ABDOMEN LIMITED template. Electronically Signed   By: Narda Rutherford M.D.   On: 11/06/2022 14:11   CT ABDOMEN PELVIS W CONTRAST  Result Date: 11/06/2022 CLINICAL DATA:  Pancreatitis suspected. Severe abdominal pain. History of gallstones. EXAM: CT ABDOMEN AND PELVIS WITH CONTRAST TECHNIQUE: Multidetector CT imaging of the abdomen and pelvis was performed using the standard protocol following bolus administration of intravenous contrast. RADIATION DOSE REDUCTION: This exam was performed according to the departmental dose-optimization program which includes automated exposure control, adjustment of the mA and/or kV according to patient size and/or use of iterative reconstruction technique. CONTRAST:  OMNIPAQUE IOHEXOL 300 MG/ML  SOLN COMPARISON:  Only relevant CT comparison is CT of pelvis with IV and oral contrast 08/08/2014. There is also a right upper quadrant ultrasound available from 08/19/2020. FINDINGS: Lower chest: Mild elevation of the right hemidiaphragm. Lung bases are clear. The cardiac size is normal. Hepatobiliary: The liver slightly steatotic with homogeneous enhancement. No mass is seen. There is mild dilatation of the gallbladder. There is a least 1 intraluminal stone, measuring 1.8 cm. Possible there could be others but this is the only stone which has a calcified rim. Some images appear to show slight thickening of the gallbladder wall over portions and trace pericholecystic fluid. Findings are concerning for cholecystitis. Pancreas: There are no inflammatory changes, mass enhancement or ductal dilatation. Spleen: There is a hypodense 1 cm lesion in the medial aspect of the spleen, Hounsfield density is 25 finding is indeterminate by CT density. Rest of the spleen  is homogeneous. Adrenals/Urinary Tract: There is no adrenal mass. Both kidneys demonstrate a few scattered small subcentimeter  hypodensities which are too small to characterize but statistically most likely representing cysts. No follow-up imaging is recommended [facility] 2018 Feb; 264-273, Management of the Incidental Renal Mass on CT, [employer] 2021; 814-848, Bosniak Classification of Cystic Renal Masses, Version 2019). There is no urinary stone or obstruction. The bladder unremarkable for the degree of distention. Stomach/Bowel: Evidence of prior sleeve gastrectomy. The gastric wall otherwise unremarkable. No small bowel obstruction or inflammatory changes are seen. The normal appendix is well visible. There is scattered uncomplicated colonic diverticulosis. Vascular/Lymphatic: No significant vascular findings are present. No enlarged abdominal or pelvic lymph nodes. Multiple pelvic phleboliths. Reproductive: Status post hysterectomy. No adnexal masses. Other: No abdominal wall hernia or abnormality. No abdominopelvic ascites. No acute inflammatory process. Musculoskeletal: Moderate lower lumbar facet hypertrophy. Mild symmetric sacroiliitis. IMPRESSION: 1. Cholelithiasis with CT findings concerning for cholecystitis. Ultrasound may be helpful to confirm. 2. Mild hepatic steatosis. 3. 1 cm indeterminate hypodense lesion in the medial spleen. Statistically this is most likely a benign lesion such as a hemangioma or lymphangioma. 4. Diverticulosis without evidence of diverticulitis. 5. Mild symmetric sacroiliitis, unchanged compared to the 2016 pelvic CT. Electronically Signed   By: Almira Bar M.D.   On: 11/06/2022 05:54    Anti-infectives: Anti-infectives (From admission, onward)    Start     Dose/Rate Route Frequency Ordered Stop   11/06/22 0800  piperacillin-tazobactam (ZOSYN) IVPB 3.375 g        3.375 g 12.5 mL/hr over 240 Minutes Intravenous Every 8 hours 11/06/22 0655     11/06/22 0645  cefTRIAXone (ROCEPHIN) 2 g in sodium chloride 0.9 % 100 mL IVPB        2 g 200 mL/hr over 30 Minutes Intravenous  Once 11/06/22 0640  11/06/22 0740       Assessment/Plan: GSP, resolving -if continues to improve plan lap chole in am - I put on lovenox -soft diet today, npo after mn -check labs am -I discussed the procedure in detail.  We discussed the risks and benefits of a laparoscopic cholecystectomy and possible cholangiogram including, but not limited to bleeding, infection, injury to surrounding structures such as the intestine or liver, bile leak, retained gallstones, need to convert to an open procedure, prolonged diarrhea, blood clots such as  DVT, common bile duct injury, anesthesia risks, and possible need for additional procedures.  The likelihood of improvement in symptoms and return to the patient's normal status is good. We discussed the typical post-operative recovery course.  Emelia Loron 11/07/2022

## 2022-11-07 NOTE — Progress Notes (Signed)
PROGRESS NOTE    [name]  GNF:[id number] DOB: June 07, 1965 DOA: 11/06/2022 PCP: [place], Betty G, MD    Brief Narrative:  [name] is a 57 year old F with PMH of NIDDM-2, HTN, HLD, morbid obesity and DDD presenting with acute abdominal pain.   Patient reports acute pain across upper abdomen radiating to her back after dinner about 7:30 PM.  Pain is now about 11 PM and she went to bed.  She woke up about 2 AM and did not have pain for a few seconds.  Then, she had severe 10/10 pain that prompted her to go to ED at [facility].  Describes the pain as cramping.  Rates her pain 10/10 before she went to ED.  Currently pain is down to 3/10.  She has 1 episode of nonbilious nonbloody emesis about 7:30 PM.  Denies fever, chills, chest pain, dyspnea or UTI symptoms.  Of note, she had similar but less intense abdominal pain when she was in [facility] about a week ago after she ate some beef.  She also reports similar pain about a year ago.  Patient denies smoking cigarettes, drinking alcohol or occasional drug use.  She denies new medications.  Likes to have cardiopulmonary resuscitation in an event of sudden cardiopulmonary arrest.  Assessment and Plan: Acute pancreatitis: Likely related to gallstone.  Presents with acute abdominal pain radiating to her back.    CT abdomen and pelvis with cholelithiasis and possible acute cholecystitis but no radiologic evidence of acute pancreatitis.  She has no leukocytosis or fever.  She has mildly elevated LFT as well.  Pain improved.  Probably passed some stone.  She denies drinking alcohol. --IV Dilaudid for pain control -IV Zofran and Protonix -plan for cholecystectomy in AM  Elevated liver enzymes: Likely due to the above.   Controlled non-insulin-dependent diabetes: On Ozempic at home.  A1c 5.6% in 08/2022. -SSI-moderate while on IV fluid with D5   Essential hypertension: Normotensive.  On low-dose Coreg at home. -IV hydralazine as needed    Morbid obesity:   History of sleeve gastrectomy. Estimated body mass index is 35.53 kg/m as calculated from the following:   Height as of this encounter: 5\' 4"  (1.626 m).   Weight as of this encounter: 93.9 kg.   Hypokalemia -replete    DVT prophylaxis: enoxaparin (LOVENOX) injection 40 mg Start: 11/07/22 1000 SCDs Start: 11/06/22 1225    Code Status: Full Code   Disposition Plan:  Level of care: Med-Surg Status is: Inpatient Remains inpatient appropriate     Consultants:  GS     Subjective: Feels well  Objective: Vitals:   11/06/22 1626 11/06/22 2011 11/07/22 0430 11/07/22 0906  BP: 103/69 106/68 97/60 112/66  Pulse: 64 61 71 64  Resp:  20 20 18   Temp: 98.2 F (36.8 C) 97.7 F (36.5 C) 97.7 F (36.5 C) 98 F (36.7 C)  TempSrc:  Oral Oral   SpO2: 100% 100% 100% 99%  Weight:      Height:        Intake/Output Summary (Last 24 hours) at 11/07/2022 1235 Last data filed at 11/07/2022 0600 Gross per 24 hour  Intake 2079.58 ml  Output 0 ml  Net 2079.58 ml   Filed Weights   11/06/22 0319  Weight: 93.9 kg    Examination:   General: Appearance:    Obese female in no acute distress     Lungs:     respirations unlabored  Heart:    Normal heart rate. Normal  rhythm. No murmurs, rubs, or gallops.    MS:   All extremities are intact.    Neurologic:   Awake, alert, oriented x 3. No apparent focal neurological           defect.        Data Reviewed: I have personally reviewed following labs and imaging studies  CBC: Recent Labs  Lab 11/06/22 0330 11/06/22 1229  WBC 7.2 4.5  NEUTROABS 3.2 2.1  HGB 13.2 12.5  HCT 40.1 37.4  MCV 80.2 81.8  PLT 222 206   Basic Metabolic Panel: Recent Labs  Lab 11/06/22 0330 11/06/22 1229 11/07/22 0138  NA 137 138 137  K 4.4 2.8* 3.4*  CL 97* 98 104  CO2 34* 30 25  GLUCOSE 98 79 96  BUN 23* 18 12  CREATININE 1.05* 1.17* 1.24*  CALCIUM 9.6 8.9 8.3*  MG  --  1.9  --   PHOS  --  3.1  --     GFR: Estimated Creatinine Clearance: 57 mL/min (A) (by C-G formula based on SCr of 1.24 mg/dL (H)). Liver Function Tests: Recent Labs  Lab 11/06/22 0330 11/06/22 1229 11/07/22 0138  AST 171* 156* 87*  ALT 125* 137* 103*  ALKPHOS 424* 424* 404*  BILITOT 1.0 1.1 0.7  PROT 7.8 6.7 5.7*  ALBUMIN 4.0 3.2* 2.7*   Recent Labs  Lab 11/06/22 0330 11/06/22 1229 11/07/22 0138  LIPASE 4,614* 1,519* 269*   No results for input(s): "AMMONIA" in the last 168 hours. Coagulation Profile: No results for input(s): "INR", "PROTIME" in the last 168 hours. Cardiac Enzymes: No results for input(s): "CKTOTAL", "CKMB", "CKMBINDEX", "TROPONINI" in the last 168 hours. BNP (last 3 results) No results for input(s): "PROBNP" in the last 8760 hours. HbA1C: No results for input(s): "HGBA1C" in the last 72 hours. CBG: Recent Labs  Lab 11/07/22 0003 11/07/22 0056 11/07/22 0427 11/07/22 0729 11/07/22 1207  GLUCAP 68* 96 95 109* 103*   Lipid Profile: Recent Labs    11/06/22 0330  CHOL 205*  HDL 80  LDLCALC 115*  TRIG 51  CHOLHDL 2.6   Thyroid Function Tests: No results for input(s): "TSH", "T4TOTAL", "FREET4", "T3FREE", "THYROIDAB" in the last 72 hours. Anemia Panel: No results for input(s): "VITAMINB12", "FOLATE", "FERRITIN", "TIBC", "IRON", "RETICCTPCT" in the last 72 hours. Sepsis Labs: No results for input(s): "PROCALCITON", "LATICACIDVEN" in the last 168 hours.  No results found for this or any previous visit (from the past 240 hour(s)).       Radiology Studies: US Abdomen Limited RUQ (LIVER/GB)  Result Date: 11/06/2022 CLINICAL DATA:  Acute cholecystitis.  Abdominal pain. EXAM: ULTRASOUND ABDOMEN LIMITED RIGHT UPPER QUADRANT COMPARISON:  CT earlier today FINDINGS: Gallbladder: Moderately distended. Shadowing 1.9 cm gallstone proximally. There is mild gallbladder wall thickening at 4 mm. Small amount of pericholecystic fluid. No sonographic Murphy sign noted by sonographer.  Common bile duct: Diameter: 4-5 mm, normal. Liver: No focal lesion identified. Mildly increased in parenchymal echogenicity. Portal vein is patent on color Doppler imaging with normal direction of blood flow towards the liver. Other: None. IMPRESSION: 1. Moderately distended gallbladder with gallstone, wall thickening and pericholecystic fluid. Findings are consistent with acute cholecystitis in the appropriate clinical setting. 2. Minimal hepatic steatosis. Acute Colles please pick the correct US ABDOMEN LIMITED template. Electronically Signed   By: Narda Rutherford M.D.   On: 11/06/2022 14:11   CT ABDOMEN PELVIS W CONTRAST  Result Date: 11/06/2022 CLINICAL DATA:  Pancreatitis suspected. Severe abdominal pain. History of  gallstones. EXAM: CT ABDOMEN AND PELVIS WITH CONTRAST TECHNIQUE: Multidetector CT imaging of the abdomen and pelvis was performed using the standard protocol following bolus administration of intravenous contrast. RADIATION DOSE REDUCTION: This exam was performed according to the departmental dose-optimization program which includes automated exposure control, adjustment of the mA and/or kV according to patient size and/or use of iterative reconstruction technique. CONTRAST:  OMNIPAQUE IOHEXOL 300 MG/ML  SOLN COMPARISON:  Only relevant CT comparison is CT of pelvis with IV and oral contrast 08/08/2014. There is also a right upper quadrant ultrasound available from 08/19/2020. FINDINGS: Lower chest: Mild elevation of the right hemidiaphragm. Lung bases are clear. The cardiac size is normal. Hepatobiliary: The liver slightly steatotic with homogeneous enhancement. No mass is seen. There is mild dilatation of the gallbladder. There is a least 1 intraluminal stone, measuring 1.8 cm. Possible there could be others but this is the only stone which has a calcified rim. Some images appear to show slight thickening of the gallbladder wall over portions and trace pericholecystic fluid. Findings are  concerning for cholecystitis. Pancreas: There are no inflammatory changes, mass enhancement or ductal dilatation. Spleen: There is a hypodense 1 cm lesion in the medial aspect of the spleen, Hounsfield density is 25 finding is indeterminate by CT density. Rest of the spleen is homogeneous. Adrenals/Urinary Tract: There is no adrenal mass. Both kidneys demonstrate a few scattered small subcentimeter hypodensities which are too small to characterize but statistically most likely representing cysts. No follow-up imaging is recommended [facility] 2018 Feb; 264-273, Management of the Incidental Renal Mass on CT, [employer] 2021; 814-848, Bosniak Classification of Cystic Renal Masses, Version 2019). There is no urinary stone or obstruction. The bladder unremarkable for the degree of distention. Stomach/Bowel: Evidence of prior sleeve gastrectomy. The gastric wall otherwise unremarkable. No small bowel obstruction or inflammatory changes are seen. The normal appendix is well visible. There is scattered uncomplicated colonic diverticulosis. Vascular/Lymphatic: No significant vascular findings are present. No enlarged abdominal or pelvic lymph nodes. Multiple pelvic phleboliths. Reproductive: Status post hysterectomy. No adnexal masses. Other: No abdominal wall hernia or abnormality. No abdominopelvic ascites. No acute inflammatory process. Musculoskeletal: Moderate lower lumbar facet hypertrophy. Mild symmetric sacroiliitis. IMPRESSION: 1. Cholelithiasis with CT findings concerning for cholecystitis. Ultrasound may be helpful to confirm. 2. Mild hepatic steatosis. 3. 1 cm indeterminate hypodense lesion in the medial spleen. Statistically this is most likely a benign lesion such as a hemangioma or lymphangioma. 4. Diverticulosis without evidence of diverticulitis. 5. Mild symmetric sacroiliitis, unchanged compared to the 2016 pelvic CT. Electronically Signed   By: Almira Bar M.D.   On: 11/06/2022 05:54         Scheduled Meds:  enoxaparin (LOVENOX) injection  40 mg Subcutaneous Daily   insulin aspart  0-15 Units Subcutaneous Q4H   insulin aspart  0-5 Units Subcutaneous QHS   pantoprazole (PROTONIX) IV  40 mg Intravenous Q24H   Continuous Infusions:  dextrose 5 % and 0.9 % NaCl with KCl 20 mEq/L 125 mL/hr at 11/07/22 0839   piperacillin-tazobactam (ZOSYN)  IV 3.375 g (11/07/22 0553)     LOS: 1 day    Time spent: 45 minutes spent on chart review, discussion with nursing staff, consultants, updating family and interview/physical exam; more than 50% of that time was spent in counseling and/or coordination of care.    Joseph Art, DO [facility] Available via Epic secure chat 7am-7pm After these hours, please refer to coverage provider listed on [email or web address] 11/07/2022, 12:35 PM

## 2022-11-07 NOTE — Progress Notes (Signed)
Hypoglycemic Event  CBG:   Latest Reference Range & Units 11/07/22 00:03  Glucose-Capillary 70 - 99 mg/dL 68 (L)  (L): Data is abnormally low  Treatment: 4 oz juice/soda  Symptoms: None  Follow-up CBG: Time:   CBG Result:  Latest Reference Range & Units 11/07/22 00:56  Glucose-Capillary 70 - 99 mg/dL 96    Possible Reasons for Event: Inadequate meal intake  Comments/MD notified:    Carmin Muskrat

## 2022-11-08 ENCOUNTER — Other Ambulatory Visit: Payer: Self-pay

## 2022-11-08 ENCOUNTER — Encounter (HOSPITAL_COMMUNITY): Payer: Self-pay | Admitting: Family Medicine

## 2022-11-08 ENCOUNTER — Inpatient Hospital Stay (HOSPITAL_COMMUNITY): Payer: BC Managed Care – PPO

## 2022-11-08 ENCOUNTER — Encounter (HOSPITAL_COMMUNITY)
Admission: EM | Disposition: A | Discharge status: Home / Self Care | Payer: Self-pay | Source: Home / Self Care | Attending: Internal Medicine

## 2022-11-08 ENCOUNTER — Inpatient Hospital Stay (HOSPITAL_COMMUNITY): Payer: BC Managed Care – PPO | Admitting: Anesthesiology

## 2022-11-08 HISTORY — PX: CHOLECYSTECTOMY: SHX55

## 2022-11-08 LAB — CBC
HCT: 37.3 % (ref 36.0–46.0)
Hemoglobin: 12 g/dL (ref 12.0–15.0)
MCH: 27 pg (ref 26.0–34.0)
MCHC: 32.2 g/dL (ref 30.0–36.0)
MCV: 83.8 fL (ref 80.0–100.0)
Platelets: 231 10*3/uL (ref 150–400)
RBC: 4.45 MIL/uL (ref 3.87–5.11)
RDW: 15.9 % — ABNORMAL HIGH (ref 11.5–15.5)
WBC: 6.5 10*3/uL (ref 4.0–10.5)
nRBC: 0 % (ref 0.0–0.2)

## 2022-11-08 LAB — COMPREHENSIVE METABOLIC PANEL WITH GFR
ALT: 95 U/L — ABNORMAL HIGH (ref 0–44)
AST: 59 U/L — ABNORMAL HIGH (ref 15–41)
Albumin: 3.2 g/dL — ABNORMAL LOW (ref 3.5–5.0)
Alkaline Phosphatase: 447 U/L — ABNORMAL HIGH (ref 38–126)
Anion gap: 7 (ref 5–15)
BUN: 9 mg/dL (ref 6–20)
CO2: 26 mmol/L (ref 22–32)
Calcium: 9.1 mg/dL (ref 8.9–10.3)
Chloride: 108 mmol/L (ref 98–111)
Creatinine, Ser: 1.12 mg/dL — ABNORMAL HIGH (ref 0.44–1.00)
GFR, Estimated: 58 mL/min — ABNORMAL LOW (ref >60)
Glucose, Bld: 98 mg/dL (ref 70–99)
Potassium: 3.9 mmol/L (ref 3.5–5.1)
Sodium: 141 mmol/L (ref 135–145)
Total Bilirubin: 0.6 mg/dL (ref 0.3–1.2)
Total Protein: 6.9 g/dL (ref 6.5–8.1)

## 2022-11-08 LAB — GLUCOSE, CAPILLARY
Glucose-Capillary: 99 mg/dL (ref 70–99)
Glucose-Capillary: 114 mg/dL — ABNORMAL HIGH (ref 70–99)
Glucose-Capillary: 88 mg/dL (ref 70–99)
Glucose-Capillary: 105 mg/dL — ABNORMAL HIGH (ref 70–99)
Glucose-Capillary: 75 mg/dL (ref 70–99)
Glucose-Capillary: 79 mg/dL (ref 70–99)

## 2022-11-08 LAB — SURGICAL PCR SCREEN
MRSA, PCR: NEGATIVE
Staphylococcus aureus: NEGATIVE

## 2022-11-08 SURGERY — LAPAROSCOPIC CHOLECYSTECTOMY WITH INTRAOPERATIVE CHOLANGIOGRAM
Anesthesia: General | Site: Abdomen

## 2022-11-08 MED ORDER — PROMETHAZINE HCL 25 MG/ML IJ SOLN: 6.2500 mg | INTRAMUSCULAR | Status: DC | PRN

## 2022-11-08 MED ORDER — ONDANSETRON HCL 4 MG/2ML IJ SOLN
INTRAMUSCULAR | Status: DC | PRN
  Administered 2022-11-08: 4 mg via INTRAVENOUS

## 2022-11-08 MED ORDER — SCOPOLAMINE 1 MG/3DAYS TD PT72
MEDICATED_PATCH | TRANSDERMAL | Status: AC
  Administered 2022-11-08: 1.5 mg via TRANSDERMAL
  Filled 2022-11-08: qty 1

## 2022-11-08 MED ORDER — ORAL CARE MOUTH RINSE: 15.0000 mL | Freq: Once | OROMUCOSAL | Status: AC

## 2022-11-08 MED ORDER — ROCURONIUM BROMIDE 10 MG/ML (PF) SYRINGE
PREFILLED_SYRINGE | INTRAVENOUS | Status: DC | PRN
  Administered 2022-11-08: 60 mg via INTRAVENOUS

## 2022-11-08 MED ORDER — FENTANYL CITRATE (PF) 100 MCG/2ML IJ SOLN
INTRAMUSCULAR | Status: AC
  Administered 2022-11-08: 50 ug via INTRAVENOUS
  Filled 2022-11-08: qty 2

## 2022-11-08 MED ORDER — INDIGOTINDISULFONATE SODIUM 8 MG/ML IJ SOLN
INTRAMUSCULAR | Status: DC | PRN
Start: 2022-11-08 — End: 2022-11-08
  Administered 2022-11-08: 2.5 mg via INTRAVENOUS

## 2022-11-08 MED ORDER — FENTANYL CITRATE (PF) 100 MCG/2ML IJ SOLN: 50.0000 ug | Freq: Once | INTRAMUSCULAR | Status: AC

## 2022-11-08 MED ORDER — CARMEX CLASSIC LIP BALM EX OINT: 1.0000 | TOPICAL_OINTMENT | CUTANEOUS | Status: DC | PRN

## 2022-11-08 MED ORDER — FENTANYL CITRATE (PF) 100 MCG/2ML IJ SOLN
25.0000 ug | INTRAMUSCULAR | Status: DC | PRN
  Administered 2022-11-08 (×2): 50 ug via INTRAVENOUS

## 2022-11-08 MED ORDER — PROPOFOL 10 MG/ML IV BOLUS
INTRAVENOUS | Status: AC
  Filled 2022-11-08: qty 20

## 2022-11-08 MED ORDER — AMISULPRIDE (ANTIEMETIC) 5 MG/2ML IV SOLN: 10.0000 mg | Freq: Once | INTRAVENOUS | Status: DC | PRN

## 2022-11-08 MED ORDER — MIDAZOLAM HCL 2 MG/2ML IJ SOLN
INTRAMUSCULAR | Status: AC
  Filled 2022-11-08: qty 2

## 2022-11-08 MED ORDER — KETOROLAC TROMETHAMINE 30 MG/ML IJ SOLN
INTRAMUSCULAR | Status: AC
  Filled 2022-11-08: qty 1

## 2022-11-08 MED ORDER — BUPIVACAINE-EPINEPHRINE (PF) 0.25% -1:200000 IJ SOLN
INTRAMUSCULAR | Status: AC
  Filled 2022-11-08: qty 30

## 2022-11-08 MED ORDER — LACTATED RINGERS IV SOLN: INTRAVENOUS | Status: DC

## 2022-11-08 MED ORDER — SODIUM CHLORIDE 0.9 % IR SOLN
Status: DC | PRN
  Administered 2022-11-08: 2000 mL

## 2022-11-08 MED ORDER — OXYCODONE HCL 5 MG PO TABS: 5.0000 mg | ORAL_TABLET | Freq: Once | ORAL | Status: DC | PRN

## 2022-11-08 MED ORDER — ACETAMINOPHEN 10 MG/ML IV SOLN
1000.0000 mg | Freq: Once | INTRAVENOUS | Status: DC | PRN
  Administered 2022-11-08: 1000 mg via INTRAVENOUS

## 2022-11-08 MED ORDER — KETOROLAC TROMETHAMINE 30 MG/ML IJ SOLN
30.0000 mg | Freq: Once | INTRAMUSCULAR | Status: AC | PRN
  Administered 2022-11-08: 30 mg via INTRAVENOUS

## 2022-11-08 MED ORDER — OXYCODONE HCL 5 MG/5ML PO SOLN: 5.0000 mg | Freq: Once | ORAL | Status: DC | PRN

## 2022-11-08 MED ORDER — ALUM & MAG HYDROXIDE-SIMETH 200-200-20 MG/5ML PO SUSP: 30.0000 mL | ORAL | Status: DC | PRN

## 2022-11-08 MED ORDER — LACTATED RINGERS IV SOLN: INTRAVENOUS | Status: DC | PRN

## 2022-11-08 MED ORDER — MIDAZOLAM HCL 2 MG/2ML IJ SOLN
INTRAMUSCULAR | Status: AC
  Administered 2022-11-08: 2 mg via INTRAVENOUS
  Filled 2022-11-08: qty 2

## 2022-11-08 MED ORDER — MORPHINE SULFATE (PF) 2 MG/ML IV SOLN
1.0000 mg | INTRAVENOUS | Status: DC | PRN
  Administered 2022-11-09: 2 mg via INTRAVENOUS
  Filled 2022-11-08: qty 1

## 2022-11-08 MED ORDER — PROPOFOL 10 MG/ML IV BOLUS
INTRAVENOUS | Status: DC | PRN
  Administered 2022-11-08: 200 mg via INTRAVENOUS

## 2022-11-08 MED ORDER — CHLORHEXIDINE GLUCONATE 0.12 % MT SOLN
OROMUCOSAL | Status: AC
  Administered 2022-11-08: 15 mL via OROMUCOSAL
  Filled 2022-11-08: qty 15

## 2022-11-08 MED ORDER — FENTANYL CITRATE (PF) 250 MCG/5ML IJ SOLN
INTRAMUSCULAR | Status: AC
  Filled 2022-11-08: qty 5

## 2022-11-08 MED ORDER — SUGAMMADEX SODIUM 200 MG/2ML IV SOLN
INTRAVENOUS | Status: DC | PRN
  Administered 2022-11-08: 200 mg via INTRAVENOUS

## 2022-11-08 MED ORDER — FENTANYL CITRATE (PF) 250 MCG/5ML IJ SOLN
INTRAMUSCULAR | Status: DC | PRN
  Administered 2022-11-08: 50 ug via INTRAVENOUS
  Administered 2022-11-08: 100 ug via INTRAVENOUS
  Administered 2022-11-08: 50 ug via INTRAVENOUS

## 2022-11-08 MED ORDER — MIDAZOLAM HCL 2 MG/2ML IJ SOLN: 2.0000 mg | Freq: Once | INTRAMUSCULAR | Status: AC

## 2022-11-08 MED ORDER — BUPIVACAINE-EPINEPHRINE 0.25% -1:200000 IJ SOLN
INTRAMUSCULAR | Status: DC | PRN
  Administered 2022-11-08: 10 mL

## 2022-11-08 MED ORDER — LIDOCAINE 2% (20 MG/ML) 5 ML SYRINGE
INTRAMUSCULAR | Status: DC | PRN
  Administered 2022-11-08: 60 mg via INTRAVENOUS

## 2022-11-08 MED ORDER — CHLORHEXIDINE GLUCONATE 0.12 % MT SOLN: 15.0000 mL | Freq: Once | OROMUCOSAL | Status: AC

## 2022-11-08 MED ORDER — SCOPOLAMINE 1 MG/3DAYS TD PT72: 1.0000 | MEDICATED_PATCH | TRANSDERMAL | Status: DC

## 2022-11-08 MED ORDER — FENTANYL CITRATE (PF) 100 MCG/2ML IJ SOLN
INTRAMUSCULAR | Status: AC
  Filled 2022-11-08: qty 2

## 2022-11-08 MED ORDER — ONDANSETRON HCL 4 MG/2ML IJ SOLN
INTRAMUSCULAR | Status: AC
  Filled 2022-11-08: qty 2

## 2022-11-08 MED ORDER — FENTANYL CITRATE (PF) 100 MCG/2ML IJ SOLN
INTRAMUSCULAR | Status: AC
  Administered 2022-11-08: 25 ug via INTRAVENOUS
  Filled 2022-11-08: qty 2

## 2022-11-08 MED ORDER — SALINE SPRAY 0.65 % NA SOLN: 1.0000 | NASAL | Status: DC | PRN

## 2022-11-08 MED ORDER — OXYCODONE HCL 5 MG PO TABS
5.0000 mg | ORAL_TABLET | ORAL | Status: DC | PRN
  Administered 2022-11-08 – 2022-11-09 (×5): 5 mg via ORAL
  Filled 2022-11-08 (×5): qty 1

## 2022-11-08 MED ORDER — ACETAMINOPHEN 10 MG/ML IV SOLN
INTRAVENOUS | Status: AC
  Filled 2022-11-08: qty 100

## 2022-11-08 MED ORDER — HEMOSTATIC AGENTS (NO CHARGE) OPTIME
TOPICAL | Status: DC | PRN
  Administered 2022-11-08: 2 via TOPICAL

## 2022-11-08 MED ORDER — MUPIROCIN 2 % EX OINT
1.0000 | TOPICAL_OINTMENT | Freq: Two times a day (BID) | CUTANEOUS | Status: DC
  Filled 2022-11-08: qty 22

## 2022-11-08 MED ORDER — INSULIN ASPART 100 UNIT/ML IJ SOLN: 0.0000 [IU] | INTRAMUSCULAR | Status: DC | PRN

## 2022-11-08 MED ORDER — 0.9 % SODIUM CHLORIDE (POUR BTL) OPTIME
TOPICAL | Status: DC | PRN
  Administered 2022-11-08: 1000 mL

## 2022-11-08 SURGICAL SUPPLY — 47 items
ADH SKN CLS APL DERMABOND .7 (GAUZE/BANDAGES/DRESSINGS) ×1
APL PRP STRL LF DISP 70% ISPRP (MISCELLANEOUS) ×1
APPLIER CLIP 5 13 M/L LIGAMAX5 (MISCELLANEOUS) ×1
APR CLP MED LRG 5 ANG JAW (MISCELLANEOUS) ×1
BAG COUNTER SPONGE SURGICOUNT (BAG) ×1 IMPLANT
BAG SPEC RTRVL 10 TROC 200 (ENDOMECHANICALS) ×1
BAG SPNG CNTER NS LX DISP (BAG) ×1
BLADE CLIPPER SURG (BLADE) IMPLANT
CANISTER SUCT 3000ML PPV (MISCELLANEOUS) ×1 IMPLANT
CHLORAPREP W/TINT 26 (MISCELLANEOUS) ×1 IMPLANT
CLIP APPLIE 5 13 M/L LIGAMAX5 (MISCELLANEOUS) ×1 IMPLANT
COVER MAYO STAND STRL (DRAPES) IMPLANT
COVER SURGICAL LIGHT HANDLE (MISCELLANEOUS) ×1 IMPLANT
DERMABOND ADVANCED .7 DNX12 (GAUZE/BANDAGES/DRESSINGS) ×1 IMPLANT
DRAPE C-ARM 42X120 X-RAY (DRAPES) IMPLANT
ELECT REM PT RETURN 9FT ADLT (ELECTROSURGICAL) ×1
ELECTRODE REM PT RTRN 9FT ADLT (ELECTROSURGICAL) ×1 IMPLANT
ENDOLOOP SUT PDS II 0 18 (SUTURE) IMPLANT
GLOVE BIO SURGEON STRL SZ7 (GLOVE) ×1 IMPLANT
GLOVE BIOGEL PI IND STRL 7.5 (GLOVE) ×1 IMPLANT
GOWN STRL REUS W/ TWL LRG LVL3 (GOWN DISPOSABLE) ×3 IMPLANT
GOWN STRL REUS W/TWL LRG LVL3 (GOWN DISPOSABLE) ×3
GRASPER SUT TROCAR 14GX15 (MISCELLANEOUS) ×1 IMPLANT
HEMOSTAT SNOW SURGICEL 2X4 (HEMOSTASIS) IMPLANT
IRRIG SUCT STRYKERFLOW 2 WTIP (MISCELLANEOUS) ×1
IRRIGATION SUCT STRKRFLW 2 WTP (MISCELLANEOUS) ×1 IMPLANT
KIT BASIN OR (CUSTOM PROCEDURE TRAY) ×1 IMPLANT
KIT IMAGING PINPOINTPAQ (MISCELLANEOUS) IMPLANT
KIT TURNOVER KIT B (KITS) ×1 IMPLANT
NS IRRIG 1000ML POUR BTL (IV SOLUTION) ×1 IMPLANT
PAD ARMBOARD 7.5X6 YLW CONV (MISCELLANEOUS) ×1 IMPLANT
POUCH RETRIEVAL ECOSAC 10 (ENDOMECHANICALS) ×1 IMPLANT
SCISSORS LAP 5X35 DISP (ENDOMECHANICALS) ×1 IMPLANT
SET CHOLANGIOGRAPH 5 50 .035 (SET/KITS/TRAYS/PACK) IMPLANT
SET TUBE SMOKE EVAC HIGH FLOW (TUBING) ×1 IMPLANT
SLEEVE Z-THREAD 5X100MM (TROCAR) ×2 IMPLANT
SPECIMEN JAR SMALL (MISCELLANEOUS) ×1 IMPLANT
STRIP CLOSURE SKIN 1/2X4 (GAUZE/BANDAGES/DRESSINGS) ×1 IMPLANT
SUT MNCRL AB 4-0 PS2 18 (SUTURE) ×1 IMPLANT
SUT VICRYL 0 UR6 27IN ABS (SUTURE) ×1 IMPLANT
TOWEL GREEN STERILE (TOWEL DISPOSABLE) ×1 IMPLANT
TOWEL GREEN STERILE FF (TOWEL DISPOSABLE) ×1 IMPLANT
TRAY LAPAROSCOPIC MC (CUSTOM PROCEDURE TRAY) ×1 IMPLANT
TROCAR BALLN 12MMX100 BLUNT (TROCAR) ×1 IMPLANT
TROCAR Z-THREAD OPTICAL 5X100M (TROCAR) ×1 IMPLANT
WARMER LAPAROSCOPE (MISCELLANEOUS) ×1 IMPLANT
WATER STERILE IRR 1000ML POUR (IV SOLUTION) ×1 IMPLANT

## 2022-11-08 NOTE — Plan of Care (Signed)
  Problem: Education: Goal: Ability to describe self-care measures that may prevent or decrease complications (Diabetes Survival Skills Education) will improve Outcome: Progressing Goal: Individualized Educational Video(s) Outcome: Progressing   Problem: Coping: Goal: Ability to adjust to condition or change in health will improve Outcome: Progressing   Problem: Fluid Volume: Goal: Ability to maintain a balanced intake and output will improve Outcome: Progressing   Problem: Health Behavior/Discharge Planning: Goal: Ability to identify and utilize available resources and services will improve Outcome: Progressing Goal: Ability to manage health-related needs will improve Outcome: Progressing   Problem: Metabolic: Goal: Ability to maintain appropriate glucose levels will improve Outcome: Progressing   Problem: Nutritional: Goal: Maintenance of adequate nutrition will improve Outcome: Progressing Goal: Progress toward achieving an optimal weight will improve Outcome: Progressing   Problem: Tissue Perfusion: Goal: Adequacy of tissue perfusion will improve Outcome: Progressing   Problem: Education: Goal: Required Educational Video(s) Outcome: Progressing   Problem: Clinical Measurements: Goal: Postoperative complications will be avoided or minimized Outcome: Progressing   Problem: Skin Integrity: Goal: Demonstration of wound healing without infection will improve Outcome: Progressing   Problem: Education: Goal: Required Educational Video(s) Outcome: Progressing   Problem: Clinical Measurements: Goal: Ability to maintain clinical measurements within normal limits will improve Outcome: Progressing Goal: Postoperative complications will be avoided or minimized Outcome: Progressing   Problem: Skin Integrity: Goal: Demonstration of wound healing without infection will improve Outcome: Progressing

## 2022-11-08 NOTE — Anesthesia Postprocedure Evaluation (Signed)
Anesthesia Post Note  Patient: [name]  Procedure(s) Performed: LAPAROSCOPIC CHOLECYSTECTOMY (Abdomen) INDOCYANINE GREEN FLUORESCENCE IMAGING (ICG) (Abdomen)     Patient location during evaluation: PACU Anesthesia Type: General Level of consciousness: awake Pain management: pain level controlled Vital Signs Assessment: post-procedure vital signs reviewed and stable Respiratory status: spontaneous breathing, nonlabored ventilation and respiratory function stable Cardiovascular status: blood pressure returned to baseline and stable Postop Assessment: no apparent nausea or vomiting Anesthetic complications: no   No notable events documented.  Last Vitals:  Vitals:   11/08/22 1335 11/08/22 1500  BP: 116/72 (!) 143/81  Pulse: 77 62  Resp: 18 18  Temp:  36.4 C  SpO2: 100% 100%    Last Pain:  Vitals:   11/08/22 1700  TempSrc:   PainSc: 3                  Jackolyn Geron P Traeh Milroy

## 2022-11-08 NOTE — Transfer of Care (Signed)
Immediate Anesthesia Transfer of Care Note  Patient: [name]  Procedure(s) Performed: LAPAROSCOPIC CHOLECYSTECTOMY (Abdomen) INDOCYANINE GREEN FLUORESCENCE IMAGING (ICG) (Abdomen)  Patient Location: PACU  Anesthesia Type:General  Level of Consciousness: awake, alert , and oriented  Airway & Oxygen Therapy: Patient connected to face mask oxygen  Post-op Assessment: Post -op Vital signs reviewed and stable  Post vital signs: stable  Last Vitals:  Vitals Value Taken Time  BP 126/80 11/08/22 1230  Temp    Pulse 87 11/08/22 1230  Resp 17 11/08/22 1231  SpO2 100 % 11/08/22 1230  Vitals shown include unfiled device data.  Last Pain:  Vitals:   11/08/22 0830  TempSrc: Oral  PainSc: 0-No pain         Complications: No notable events documented.

## 2022-11-08 NOTE — Plan of Care (Signed)
  Problem: Skin Integrity: Goal: Demonstration of wound healing without infection will improve 11/08/2022 0612 by Oswald Hillock, RN Outcome: Progressing 11/08/2022 0610 by Oswald Hillock, RN Outcome: Progressing

## 2022-11-08 NOTE — Progress Notes (Signed)
   Subjective/Chief Complaint: No ab pain, tol diet, having bowel function   Objective: Vital signs in last 24 hours: Temp:  [97.6 F (36.4 C)-98.2 F (36.8 C)] 97.7 F (36.5 C) (07/28 0400) Pulse Rate:  [61-89] 61 (07/28 0400) Resp:  [18] 18 (07/27 1744) BP: (112-125)/(66-82) 125/82 (07/28 0400) SpO2:  [99 %-100 %] 100 % (07/28 0400) Last BM Date : 11/04/22  Intake/Output from previous day: No intake/output data recorded. Intake/Output this shift: No intake/output data recorded.  Ab soft nontender nondistended   Lab Results:  Recent Labs    11/06/22 1229 11/08/22 0040  WBC 4.5 6.5  HGB 12.5 12.0  HCT 37.4 37.3  PLT 206 231   BMET Recent Labs    11/07/22 0138 11/08/22 0040  NA 137 141  K 3.4* 3.9  CL 104 108  CO2 25 26  GLUCOSE 96 98  BUN 12 9  CREATININE 1.24* 1.12*  CALCIUM 8.3* 9.1   PT/INR No results for input(s): "LABPROT", "INR" in the last 72 hours. ABG No results for input(s): "PHART", "HCO3" in the last 72 hours.  Invalid input(s): "PCO2", "PO2"  Studies/Results: US Abdomen Limited RUQ (LIVER/GB)  Result Date: 11/06/2022 CLINICAL DATA:  Acute cholecystitis.  Abdominal pain. EXAM: ULTRASOUND ABDOMEN LIMITED RIGHT UPPER QUADRANT COMPARISON:  CT earlier today FINDINGS: Gallbladder: Moderately distended. Shadowing 1.9 cm gallstone proximally. There is mild gallbladder wall thickening at 4 mm. Small amount of pericholecystic fluid. No sonographic Murphy sign noted by sonographer. Common bile duct: Diameter: 4-5 mm, normal. Liver: No focal lesion identified. Mildly increased in parenchymal echogenicity. Portal vein is patent on color Doppler imaging with normal direction of blood flow towards the liver. Other: None. IMPRESSION: 1. Moderately distended gallbladder with gallstone, wall thickening and pericholecystic fluid. Findings are consistent with acute cholecystitis in the appropriate clinical setting. 2. Minimal hepatic steatosis. Acute Colles  please pick the correct US ABDOMEN LIMITED template. Electronically Signed   By: Narda Rutherford M.D.   On: 11/06/2022 14:11    Anti-infectives: Anti-infectives (From admission, onward)    Start     Dose/Rate Route Frequency Ordered Stop   11/06/22 0800  piperacillin-tazobactam (ZOSYN) IVPB 3.375 g        3.375 g 12.5 mL/hr over 240 Minutes Intravenous Every 8 hours 11/06/22 0655     11/06/22 0645  cefTRIAXone (ROCEPHIN) 2 g in sodium chloride 0.9 % 100 mL IVPB        2 g 200 mL/hr over 30 Minutes Intravenous  Once 11/06/22 0640 11/06/22 0740       Assessment/Plan: GSP, resolving -lap chole today  Emelia Loron 11/08/2022

## 2022-11-08 NOTE — Op Note (Signed)
Preoperative diagnosis: gallstone pancreatitis Postoperative diagnosis: Chronic cholecystitis, saa Surgery: Laparoscopic cholecystectomy with ICG dye, primary umbilical hernia repair Surgeon: Dr. Harden Mo Estimated blood loss: 100 cc Anesthesia General with bilateral tap block Specimens: Gallbladder and contents to pathology Complications: None Drains: None Sponge needle count was correct completion Disposition recovery stable condition   Indications: 40 yof with prior sleeve gastrectomy now with gallstone pancreatitis. This has resolved and we discussed proceeding with lap chole.    Procedure: After informed consent was obtained she was taken to the OR SCDs were in place.  She was on antibiotics.  She was placed under general anesthesia without complication.  She was then prepped and draped in the standard sterile surgical fashion.  A surgical timeout was then performed.   I made a vertical incision below the umbilicus.  I carried this down to the fascia.  I grasped the fascia and entered sharply.  This was difficult due to prior scar tissue. She also had a small umbilical hernia present. I entered the peritoneum bluntly.  There was no entry injury.  I placed a 0 Vicryl pursestring suture through the fascia.  I then inserted a Hassan trocar and insufflated the abdomen to 15 mmHg pressure.  I then inserted 3 additional 5 mm trocars in the epigastrium and the right side of the abdomen under direct vision without complication.  Her gallbladder was noted to have very significant chronic cholecystitis.  I then retracted it cephalad.  Her transverse colon and omentum were adherent to it.  I took this down with a combination of blunt and sharp dissection.  Her duodenum was also adherent to her gallbladder and this was taken down with a combination of blunt sharp dissection as well.  Eventually I was able to dissect in the triangle.  I used the ICG dye to identify my common bile duct and there was  flow into the duodenum. The cystic artery ended up being adherent to the cystic duct and I did have bleeding from this. I inserted a sponge and was able to hold pressure while I was able to control this with a clip.  This accounts for blood loss.   Eventually with a combination of dissecting in the triangle and taking this from the top down due to the inflammation in the triangle I was able to obtain a critical view of safety.  I was able to confirm that I was at the cystic duct with the ICG dye as well.  I was unable to do cholangiogram due to the bleeding and inflammation at the site.   I then placed a clip on the cystic duct.  I also placed 2 Endoloops on the cystic duct stump.  I then divided the gallbladder very high off of the cystic duct and placed it in a retrieval bag.  This was removed from the abdomen.  Hemostasis was obtained as the liver was very friable.  I placed 2 pieces of Surgicel snow overlying the liver.  I then removed the gallbladder and the sponge from the abdomen.  I removed the [facility] trocar and tied the pursestring down.  I placed a couple additional 0 Vicryl sutures to completely obliterate the umbilical defect.  I also closed the umbliical hernia with a 0 vicryl sutureThe remaining trocars were removed the abdomen desufflated.  These were closed with 4-0 Monocryl and glue.  She tolerated this well was extubated and transferred to recovery stable.

## 2022-11-08 NOTE — Anesthesia Preprocedure Evaluation (Addendum)
Anesthesia Evaluation  Patient identified by MRN, date of birth, ID band Patient awake    Reviewed: Allergy & Precautions, NPO status , Patient's Chart, lab work & pertinent test results  Airway Mallampati: II  TM Distance: >3 FB Neck ROM: Full    Dental no notable dental hx.    Pulmonary neg pulmonary ROS   Pulmonary exam normal        Cardiovascular hypertension, Pt. on home beta blockers and Pt. on medications Normal cardiovascular exam     Neuro/Psych  Headaches TIA Neuromuscular disease  negative psych ROS   GI/Hepatic negative GI ROS, Neg liver ROS,,,  Endo/Other  diabetes  Patient on GLP-1 Agonist  Renal/GU Renal InsufficiencyRenal disease     Musculoskeletal  (+) Arthritis ,    Abdominal  (+) + obese  Peds  Hematology negative hematology ROS (+)   Anesthesia Other Findings Gallstones  Reproductive/Obstetrics                             Anesthesia Physical Anesthesia Plan  ASA: 3  Anesthesia Plan: General   Post-op Pain Management:    Induction: Intravenous  PONV Risk Score and Plan: 4 or greater and Ondansetron, Dexamethasone, Midazolam, Scopolamine patch - Pre-op and Treatment may vary due to age or medical condition  Airway Management Planned: Oral ETT  Additional Equipment:   Intra-op Plan:   Post-operative Plan: Extubation in OR  Informed Consent: I have reviewed the patients History and Physical, chart, labs and discussed the procedure including the risks, benefits and alternatives for the proposed anesthesia with the patient or authorized representative who has indicated his/her understanding and acceptance.     Dental advisory given  Plan Discussed with: CRNA  Anesthesia Plan Comments: (Potential bilateral TAP block discussed)       Anesthesia Quick Evaluation

## 2022-11-08 NOTE — Progress Notes (Signed)
PROGRESS NOTE    [name]  NFA:[id number] DOB: Dec 16, 1965 DOA: 11/06/2022 PCP: [place], Betty G, MD    Brief Narrative:  [name] is a 57 year old F with PMH of NIDDM-2, HTN, HLD, morbid obesity and DDD presenting with acute abdominal pain.   Patient reports acute pain across upper abdomen radiating to her back after dinner about 7:30 PM.  Pain is now about 11 PM and she went to bed.  She woke up about 2 AM and did not have pain for a few seconds.  Then, she had severe 10/10 pain that prompted her to go to ED at [facility].  Describes the pain as cramping.  Rates her pain 10/10 before she went to ED.  Currently pain is down to 3/10.  She has 1 episode of nonbilious nonbloody emesis about 7:30 PM.  Denies fever, chills, chest pain, dyspnea or UTI symptoms.  Of note, she had similar but less intense abdominal pain when she was in [facility] about a week ago after she ate some beef.  She also reports similar pain about a year ago.  Patient denies smoking cigarettes, drinking alcohol or occasional drug use.  She denies new medications.  Likes to have cardiopulmonary resuscitation in an event of sudden cardiopulmonary arrest.  Assessment and Plan: Acute pancreatitis: Likely related to gallstone.  Presents with acute abdominal pain radiating to her back.    CT abdomen and pelvis with cholelithiasis and possible acute cholecystitis but no radiologic evidence of acute pancreatitis.  She has no leukocytosis or fever.  She has mildly elevated LFT as well.  Pain improved.  Probably passed some stone.  She denies drinking alcohol. -IV Zofran and Protonix -s/p cholecystectomy  Elevated liver enzymes: Likely due to the above.   Controlled non-insulin-dependent diabetes: On Ozempic at home.  A1c 5.6% in 08/2022.    Essential hypertension: Normotensive.  On low-dose Coreg at home. -IV hydralazine as needed   Morbid obesity:   History of sleeve gastrectomy. Estimated body mass index is 35.51  kg/m as calculated from the following:   Height as of this encounter: 5' 4.02" (1.626 m).   Weight as of this encounter: 93.9 kg.   Hypokalemia -repleted    DVT prophylaxis: enoxaparin (LOVENOX) injection 40 mg Start: 11/07/22 1000 SCDs Start: 11/06/22 1225    Code Status: Full Code   Disposition Plan:  Level of care: Med-Surg Status is: Inpatient Remains inpatient appropriate     Consultants:  GS     Subjective: In the OR  Objective: Vitals:   11/08/22 1245 11/08/22 1300 11/08/22 1315 11/08/22 1335  BP: 131/83 123/77 127/83 116/72  Pulse: 73 69 70 77  Resp: 14 10 16 18   Temp: (!) 97.1 F (36.2 C)  (!) 97.3 F (36.3 C)   TempSrc:      SpO2: 100% 100% 99% 100%  Weight:      Height:        Intake/Output Summary (Last 24 hours) at 11/08/2022 1357 Last data filed at 11/08/2022 1213 Gross per 24 hour  Intake 800 ml  Output --  Net 800 ml   Filed Weights   11/06/22 0319 11/08/22 0909  Weight: 93.9 kg 93.9 kg    Examination:  In the OR    Data Reviewed: I have personally reviewed following labs and imaging studies  CBC: Recent Labs  Lab 11/06/22 0330 11/06/22 1229 11/08/22 0040  WBC 7.2 4.5 6.5  NEUTROABS 3.2 2.1  --   HGB 13.2 12.5 12.0  HCT 40.1 37.4 37.3  MCV 80.2 81.8 83.8  PLT 222 206 231   Basic Metabolic Panel: Recent Labs  Lab 11/06/22 0330 11/06/22 1229 11/07/22 0138 11/08/22 0040  NA 137 138 137 141  K 4.4 2.8* 3.4* 3.9  CL 97* 98 104 108  CO2 34* 30 25 26   GLUCOSE 98 79 96 98  BUN 23* 18 12 9   CREATININE 1.05* 1.17* 1.24* 1.12*  CALCIUM 9.6 8.9 8.3* 9.1  MG  --  1.9  --   --   PHOS  --  3.1  --   --    GFR: Estimated Creatinine Clearance: 63.1 mL/min (A) (by C-G formula based on SCr of 1.12 mg/dL (H)). Liver Function Tests: Recent Labs  Lab 11/06/22 0330 11/06/22 1229 11/07/22 0138 11/08/22 0040  AST 171* 156* 87* 59*  ALT 125* 137* 103* 95*  ALKPHOS 424* 424* 404* 447*  BILITOT 1.0 1.1 0.7 0.6  PROT 7.8  6.7 5.7* 6.9  ALBUMIN 4.0 3.2* 2.7* 3.2*   Recent Labs  Lab 11/06/22 0330 11/06/22 1229 11/07/22 0138  LIPASE 4,614* 1,519* 269*   No results for input(s): "AMMONIA" in the last 168 hours. Coagulation Profile: No results for input(s): "INR", "PROTIME" in the last 168 hours. Cardiac Enzymes: No results for input(s): "CKTOTAL", "CKMB", "CKMBINDEX", "TROPONINI" in the last 168 hours. BNP (last 3 results) No results for input(s): "PROBNP" in the last 8760 hours. HbA1C: No results for input(s): "HGBA1C" in the last 72 hours. CBG: Recent Labs  Lab 11/07/22 2218 11/08/22 0426 11/08/22 0743 11/08/22 1021 11/08/22 1227  GLUCAP 92 99 114* 88 105*   Lipid Profile: Recent Labs    11/06/22 0330  CHOL 205*  HDL 80  LDLCALC 115*  TRIG 51  CHOLHDL 2.6   Thyroid Function Tests: No results for input(s): "TSH", "T4TOTAL", "FREET4", "T3FREE", "THYROIDAB" in the last 72 hours. Anemia Panel: No results for input(s): "VITAMINB12", "FOLATE", "FERRITIN", "TIBC", "IRON", "RETICCTPCT" in the last 72 hours. Sepsis Labs: No results for input(s): "PROCALCITON", "LATICACIDVEN" in the last 168 hours.  Recent Results (from the past 240 hour(s))  Surgical PCR screen     Status: None   Collection Time: 11/08/22 12:08 AM   Specimen: Nasal Mucosa; Nasal Swab  Result Value Ref Range Status   MRSA, PCR NEGATIVE NEGATIVE Final   Staphylococcus aureus NEGATIVE NEGATIVE Final    Comment: (NOTE) The Xpert SA Assay (FDA approved for NASAL specimens in patients 67 years of age and older), is one component of a comprehensive surveillance program. It is not intended to diagnose infection nor to guide or monitor treatment. Performed at [facility], [address], [place], [place] [address]          Radiology Studies: No results found.      Scheduled Meds:  enoxaparin (LOVENOX) injection  40 mg Subcutaneous Daily   fentaNYL       insulin aspart  0-15 Units Subcutaneous Q4H    insulin aspart  0-5 Units Subcutaneous QHS   ketorolac       mupirocin ointment  1 Application Nasal BID   pantoprazole (PROTONIX) IV  40 mg Intravenous Q24H   Continuous Infusions:  acetaminophen     piperacillin-tazobactam (ZOSYN)  IV 3.375 g (11/08/22 0508)     LOS: 2 days    Time spent: 45 minutes spent on chart review, discussion with nursing staff, consultants, updating family and interview/physical exam; more than 50% of that time was spent in counseling and/or  coordination of care.    Joseph Art, DO [facility] Available via Epic secure chat 7am-7pm After these hours, please refer to coverage provider listed on [email or web address] 11/08/2022, 1:57 PM

## 2022-11-08 NOTE — Anesthesia Procedure Notes (Addendum)
Procedure Name: Intubation Date/Time: 11/08/2022 10:48 AM  Performed by: Dorie Rank, CRNAPre-anesthesia Checklist: Patient identified, Emergency Drugs available, Suction available, Patient being monitored and Timeout performed Patient Re-evaluated:Patient Re-evaluated prior to induction Oxygen Delivery Method: Circle system utilized Preoxygenation: Pre-oxygenation with 100% oxygen Induction Type: IV induction Ventilation: Mask ventilation without difficulty Laryngoscope Size: Mac and 3 Grade View: Grade I Tube type: Oral Tube size: 7.5 mm Number of attempts: 1 Airway Equipment and Method: Stylet Placement Confirmation: breath sounds checked- equal and bilateral, CO2 detector and ETT inserted through vocal cords under direct vision Secured at: 22 cm Tube secured with: Tape Dental Injury: Teeth and Oropharynx as per pre-operative assessment

## 2022-11-09 ENCOUNTER — Encounter (HOSPITAL_COMMUNITY): Payer: Self-pay | Admitting: General Surgery

## 2022-11-09 DIAGNOSIS — K851 Biliary acute pancreatitis without necrosis or infection: Secondary | ICD-10-CM | POA: Diagnosis not present

## 2022-11-09 DIAGNOSIS — K819 Cholecystitis, unspecified: Secondary | ICD-10-CM | POA: Diagnosis not present

## 2022-11-09 LAB — GLUCOSE, CAPILLARY
Glucose-Capillary: 93 mg/dL (ref 70–99)
Glucose-Capillary: 80 mg/dL (ref 70–99)
Glucose-Capillary: 97 mg/dL (ref 70–99)
Glucose-Capillary: 80 mg/dL (ref 70–99)
Glucose-Capillary: 74 mg/dL (ref 70–99)
Glucose-Capillary: 83 mg/dL (ref 70–99)

## 2022-11-09 MED ORDER — INSULIN ASPART 100 UNIT/ML IJ SOLN: 0.0000 [IU] | Freq: Every day | INTRAMUSCULAR | Status: DC

## 2022-11-09 MED ORDER — INSULIN ASPART 100 UNIT/ML IJ SOLN: 0.0000 [IU] | Freq: Three times a day (TID) | INTRAMUSCULAR | Status: DC

## 2022-11-09 MED ORDER — ORAL CARE MOUTH RINSE: 15.0000 mL | OROMUCOSAL | Status: DC | PRN

## 2022-11-09 NOTE — Progress Notes (Signed)
PROGRESS NOTE    [name]  XBM:[id number] DOB: June 08, 1965 DOA: 11/06/2022 PCP: [place], Betty G, MD    Brief Narrative:  [name] is a 57 year old F with PMH of NIDDM-2, HTN, HLD, morbid obesity and DDD presenting with acute abdominal pain.   Patient reports acute pain across upper abdomen radiating to her back after dinner about 7:30 PM.  Pain is now about 11 PM and she went to bed.  She woke up about 2 AM and did not have pain for a few seconds.  Then, she had severe 10/10 pain that prompted her to go to ED at [facility].  Describes the pain as cramping.  Rates her pain 10/10 before she went to ED.  Currently pain is down to 3/10.   Had GB removed.  LFTs evaluated on POD 1.   Assessment and Plan: Acute pancreatitis: Likely related to gallstone.  Presents with acute abdominal pain radiating to her back.    CT abdomen and pelvis with cholelithiasis and possible acute cholecystitis but no radiologic evidence of acute pancreatitis.  She has no leukocytosis or fever.  She has mildly elevated LFT as well.  Pain improved.  Probably passed some stone.  She denies drinking alcohol. -IV Zofran and Protonix -s/p cholecystectomy -LFTs in AM  Elevated liver enzymes: Likely due to the above. -CMP in AM   Controlled non-insulin-dependent diabetes: On Ozempic at home -SSI for now  Essential hypertension: Normotensive.  On low-dose Coreg at home. -IV hydralazine as needed   Morbid obesity:   History of sleeve gastrectomy. Estimated body mass index is 35.51 kg/m as calculated from the following:   Height as of this encounter: 5' 4.02" (1.626 m).   Weight as of this encounter: 93.9 kg.   Hypokalemia -repleted    DVT prophylaxis: enoxaparin (LOVENOX) injection 40 mg Start: 11/07/22 1000 SCDs Start: 11/06/22 1225    Code Status: Full Code   Disposition Plan:  Level of care: Med-Surg Status is: Inpatient Remains inpatient appropriate     Consultants:   GS     Subjective: Eating well, had some itching with dilaudid  Objective: Vitals:   11/08/22 1500 11/08/22 2115 11/09/22 0405 11/09/22 0731  BP: (!) 143/81 138/74 120/69 118/77  Pulse: 62 (!) 58 65 81  Resp: 18 18 18 18   Temp: 97.6 F (36.4 C) 98.3 F (36.8 C) 98.5 F (36.9 C) 98.3 F (36.8 C)  TempSrc:    Oral  SpO2: 100%  99% 98%  Weight:      Height:        Intake/Output Summary (Last 24 hours) at 11/09/2022 1224 Last data filed at 11/09/2022 1119 Gross per 24 hour  Intake 2986.12 ml  Output 3250 ml  Net -263.88 ml   Filed Weights   11/06/22 0319 11/08/22 0909  Weight: 93.9 kg 93.9 kg    Examination:  In bed, NAD rrr    Data Reviewed: I have personally reviewed following labs and imaging studies  CBC: Recent Labs  Lab 11/06/22 0330 11/06/22 1229 11/08/22 0040 11/09/22 0218  WBC 7.2 4.5 6.5 6.6  NEUTROABS 3.2 2.1  --   --   HGB 13.2 12.5 12.0 11.2*  HCT 40.1 37.4 37.3 34.9*  MCV 80.2 81.8 83.8 81.5  PLT 222 206 231 217   Basic Metabolic Panel: Recent Labs  Lab 11/06/22 0330 11/06/22 1229 11/07/22 0138 11/08/22 0040 11/09/22 0218  NA 137 138 137 141 137  K 4.4 2.8* 3.4* 3.9 3.6  CL  97* 98 104 108 105  CO2 34* 30 25 26 26   GLUCOSE 98 79 96 98 86  BUN 23* 18 12 9 6   CREATININE 1.05* 1.17* 1.24* 1.12* 1.11*  CALCIUM 9.6 8.9 8.3* 9.1 8.9  MG  --  1.9  --   --   --   PHOS  --  3.1  --   --   --    GFR: Estimated Creatinine Clearance: 63.6 mL/min (A) (by C-G formula based on SCr of 1.11 mg/dL (H)). Liver Function Tests: Recent Labs  Lab 11/06/22 0330 11/06/22 1229 11/07/22 0138 11/08/22 0040 11/09/22 0218  AST 171* 156* 87* 59* 181*  ALT 125* 137* 103* 95* 152*  ALKPHOS 424* 424* 404* 447* 489*  BILITOT 1.0 1.1 0.7 0.6 2.1*  PROT 7.8 6.7 5.7* 6.9 6.0*  ALBUMIN 4.0 3.2* 2.7* 3.2* 2.7*   Recent Labs  Lab 11/06/22 0330 11/06/22 1229 11/07/22 0138  LIPASE 4,614* 1,519* 269*   No results for input(s): "AMMONIA" in the last  168 hours. Coagulation Profile: No results for input(s): "INR", "PROTIME" in the last 168 hours. Cardiac Enzymes: No results for input(s): "CKTOTAL", "CKMB", "CKMBINDEX", "TROPONINI" in the last 168 hours. BNP (last 3 results) No results for input(s): "PROBNP" in the last 8760 hours. HbA1C: No results for input(s): "HGBA1C" in the last 72 hours. CBG: Recent Labs  Lab 11/08/22 2115 11/09/22 0104 11/09/22 0406 11/09/22 0729 11/09/22 1119  GLUCAP 79 93 80 97 80   Lipid Profile: No results for input(s): "CHOL", "HDL", "LDLCALC", "TRIG", "CHOLHDL", "LDLDIRECT" in the last 72 hours.  Thyroid Function Tests: No results for input(s): "TSH", "T4TOTAL", "FREET4", "T3FREE", "THYROIDAB" in the last 72 hours. Anemia Panel: No results for input(s): "VITAMINB12", "FOLATE", "FERRITIN", "TIBC", "IRON", "RETICCTPCT" in the last 72 hours. Sepsis Labs: No results for input(s): "PROCALCITON", "LATICACIDVEN" in the last 168 hours.  Recent Results (from the past 240 hour(s))  Surgical PCR screen     Status: None   Collection Time: 11/08/22 12:08 AM   Specimen: Nasal Mucosa; Nasal Swab  Result Value Ref Range Status   MRSA, PCR NEGATIVE NEGATIVE Final   Staphylococcus aureus NEGATIVE NEGATIVE Final    Comment: (NOTE) The Xpert SA Assay (FDA approved for NASAL specimens in patients 41 years of age and older), is one component of a comprehensive surveillance program. It is not intended to diagnose infection nor to guide or monitor treatment. Performed at [facility], [address], [place], [place] [address]          Radiology Studies: No results found.      Scheduled Meds:  enoxaparin (LOVENOX) injection  40 mg Subcutaneous Daily   insulin aspart  0-15 Units Subcutaneous TID WC   insulin aspart  0-5 Units Subcutaneous QHS   mupirocin ointment  1 Application Nasal BID   pantoprazole (PROTONIX) IV  40 mg Intravenous Q24H   Continuous Infusions:     LOS: 3 days     Time spent: 45 minutes spent on chart review, discussion with nursing staff, consultants, updating family and interview/physical exam; more than 50% of that time was spent in counseling and/or coordination of care.    Joseph Art, DO [facility] Available via Epic secure chat 7am-7pm After these hours, please refer to coverage provider listed on [email or web address] 11/09/2022, 12:24 PM

## 2022-11-09 NOTE — Progress Notes (Signed)
    1 Day Post-Op  Subjective: Feels ok today.  Some soreness.  Up walking back to bed after using the restroom.  Ate breakfast already with no nausea.  Pain better controlled with oxy.  ROS: See above, otherwise other systems negative  Objective: Vital signs in last 24 hours: Temp:  [96.8 F (36 C)-98.5 F (36.9 C)] 98.3 F (36.8 C) (07/29 0731) Pulse Rate:  [57-88] 81 (07/29 0731) Resp:  [10-22] 18 (07/29 0731) BP: (111-149)/(69-83) 118/77 (07/29 0731) SpO2:  [98 %-100 %] 98 % (07/29 0731) Weight:  [93.9 kg] 93.9 kg (07/28 0909) Last BM Date : 11/04/22  Intake/Output from previous day: 07/28 0701 - 07/29 0700 In: 3786.1 [P.O.:30; I.V.:3438.1; IV Piggyback:318] Out: 2550 [Urine:2550] Intake/Output this shift: No intake/output data recorded.  PE: Abd: soft, appropriately tender, +BS, ND, incisions c/d/i  Lab Results:  Recent Labs    11/08/22 0040 11/09/22 0218  WBC 6.5 6.6  HGB 12.0 11.2*  HCT 37.3 34.9*  PLT 231 217   BMET Recent Labs    11/08/22 0040 11/09/22 0218  NA 141 137  K 3.9 3.6  CL 108 105  CO2 26 26  GLUCOSE 98 86  BUN 9 6  CREATININE 1.12* 1.11*  CALCIUM 9.1 8.9   PT/INR No results for input(s): "LABPROT", "INR" in the last 72 hours. CMP     Component Value Date/Time   NA 137 11/09/2022 0218   K 3.6 11/09/2022 0218   CL 105 11/09/2022 0218   CO2 26 11/09/2022 0218   GLUCOSE 86 11/09/2022 0218   BUN 6 11/09/2022 0218   CREATININE 1.11 (H) 11/09/2022 0218   CREATININE 1.10 (H) 08/02/2020 1601   CALCIUM 8.9 11/09/2022 0218   PROT 6.0 (L) 11/09/2022 0218   ALBUMIN 2.7 (L) 11/09/2022 0218   AST 181 (H) 11/09/2022 0218   ALT 152 (H) 11/09/2022 0218   ALKPHOS 489 (H) 11/09/2022 0218   BILITOT 2.1 (H) 11/09/2022 0218   GFRNONAA 59 (L) 11/09/2022 0218   GFRNONAA 55 (L) 11/29/2019 0905   GFRAA 64 11/29/2019 0905   Lipase     Component Value Date/Time   LIPASE 269 (H) 11/07/2022 0138       Studies/Results: No results  found.  Anti-infectives: Anti-infectives (From admission, onward)    Start     Dose/Rate Route Frequency Ordered Stop   11/06/22 0800  piperacillin-tazobactam (ZOSYN) IVPB 3.375 g        3.375 g 12.5 mL/hr over 240 Minutes Intravenous Every 8 hours 11/06/22 0655 11/09/22 0720   11/06/22 0645  cefTRIAXone (ROCEPHIN) 2 g in sodium chloride 0.9 % 100 mL IVPB        2 g 200 mL/hr over 30 Minutes Intravenous  Once 11/06/22 0640 11/06/22 0740        Assessment/Plan POD 1, s/p lap chole by Dr. Dwain Sarna 7/28 for gallstone pancreatitis and chronic cholecystitis -difficult gallbladder.  Endoloop around cystic duct. -some blood loss, hgb looks good today -LFTs up.  Some expected, but TB up to 2.1 from normal.  Will need to remain inpatient to recheck CMET in am to make sure these are stable to downtrending. -cont to mobilize -cont soft diet -will monitor, d/w primary service  FEN - soft VTE - lovenox ID - zosyn completed    LOS: 3 days    Letha Cape , [facility] 11/09/2022, 9:07 AM Please see Amion for pager number during day hours 7:00am-4:30pm or 7:00am -11:30am on weekends

## 2022-11-10 ENCOUNTER — Other Ambulatory Visit (HOSPITAL_COMMUNITY): Payer: Self-pay

## 2022-11-10 DIAGNOSIS — K851 Biliary acute pancreatitis without necrosis or infection: Secondary | ICD-10-CM | POA: Diagnosis not present

## 2022-11-10 DIAGNOSIS — K819 Cholecystitis, unspecified: Secondary | ICD-10-CM | POA: Diagnosis not present

## 2022-11-10 MED ORDER — POLYETHYLENE GLYCOL 3350 17 G PO PACK: 17.0000 g | PACK | Freq: Every day | ORAL | Status: DC

## 2022-11-10 MED ORDER — OZEMPIC (0.25 OR 0.5 MG/DOSE) 2 MG/3ML ~~LOC~~ SOPN: 0.5000 mg | PEN_INJECTOR | SUBCUTANEOUS | Status: DC

## 2022-11-10 MED ORDER — OXYCODONE HCL 5 MG PO TABS
5.0000 mg | ORAL_TABLET | ORAL | 0 refills | Status: DC | PRN
  Filled 2022-11-10: qty 15, 3d supply, fill #0

## 2022-11-10 NOTE — Plan of Care (Signed)
  Problem: Clinical Measurements: Goal: Ability to maintain clinical measurements within normal limits will improve 11/10/2022 1353 by Irwin Brakeman, RN Outcome: Adequate for Discharge 11/10/2022 1352 by Irwin Brakeman, RN Outcome: Progressing 11/10/2022 1352 by Irwin Brakeman, RN Outcome: Progressing Goal: Postoperative complications will be avoided or minimized 11/10/2022 1353 by Irwin Brakeman, RN Outcome: Adequate for Discharge 11/10/2022 1352 by Irwin Brakeman, RN Outcome: Progressing 11/10/2022 1352 by Irwin Brakeman, RN Outcome: Progressing   Problem: Skin Integrity: Goal: Demonstration of wound healing without infection will improve 11/10/2022 1353 by Irwin Brakeman, RN Outcome: Adequate for Discharge 11/10/2022 1352 by Irwin Brakeman, RN Outcome: Progressing 11/10/2022 1352 by Irwin Brakeman, RN Outcome: Progressing

## 2022-11-10 NOTE — Progress Notes (Signed)
2 Days Post-Op  Subjective: Pain/soreness improved. Tolerating diet. Belching but denies nausea or vomiting. No flatus or BM yet. Patient does report some hx of constipation, takes miralax prn at home. She has chronically elevated Alk Phos that she is aware off, we reviewed labs this AM.   Objective: Vital signs in last 24 hours: Temp:  [98.2 F (36.8 C)-98.6 F (37 C)] 98.5 F (36.9 C) (07/30 0927) Pulse Rate:  [74-89] 80 (07/30 0927) Resp:  [18] 18 (07/30 0927) BP: (111-142)/(74-81) 129/74 (07/30 0927) SpO2:  [100 %] 100 % (07/30 0927) Last BM Date : 11/04/22  Intake/Output from previous day: 07/29 0701 - 07/30 0700 In: 600 [P.O.:600] Out: 1300 [Urine:1300] Intake/Output this shift: No intake/output data recorded.  PE: Abd: soft, appropriately tender, +BS, ND, incisions c/d/i  Lab Results:  Recent Labs    11/08/22 0040 11/09/22 0218  WBC 6.5 6.6  HGB 12.0 11.2*  HCT 37.3 34.9*  PLT 231 217   BMET Recent Labs    11/09/22 0218 11/10/22 0026  NA 137 136  K 3.6 3.3*  CL 105 105  CO2 26 24  GLUCOSE 86 80  BUN 6 5*  CREATININE 1.11* 1.04*  CALCIUM 8.9 8.7*   PT/INR No results for input(s): "LABPROT", "INR" in the last 72 hours. CMP     Component Value Date/Time   NA 136 11/10/2022 0026   K 3.3 (L) 11/10/2022 0026   CL 105 11/10/2022 0026   CO2 24 11/10/2022 0026   GLUCOSE 80 11/10/2022 0026   BUN 5 (L) 11/10/2022 0026   CREATININE 1.04 (H) 11/10/2022 0026   CREATININE 1.10 (H) 08/02/2020 1601   CALCIUM 8.7 (L) 11/10/2022 0026   PROT 6.0 (L) 11/10/2022 0026   ALBUMIN 2.7 (L) 11/10/2022 0026   AST 86 (H) 11/10/2022 0026   ALT 126 (H) 11/10/2022 0026   ALKPHOS 520 (H) 11/10/2022 0026   BILITOT 1.5 (H) 11/10/2022 0026   GFRNONAA >60 11/10/2022 0026   GFRNONAA 55 (L) 11/29/2019 0905   GFRAA 64 11/29/2019 0905   Lipase     Component Value Date/Time   LIPASE 269 (H) 11/07/2022 0138       Studies/Results: No results  found.  Anti-infectives: Anti-infectives (From admission, onward)    Start     Dose/Rate Route Frequency Ordered Stop   11/06/22 0800  piperacillin-tazobactam (ZOSYN) IVPB 3.375 g        3.375 g 12.5 mL/hr over 240 Minutes Intravenous Every 8 hours 11/06/22 0655 11/09/22 0720   11/06/22 0645  cefTRIAXone (ROCEPHIN) 2 g in sodium chloride 0.9 % 100 mL IVPB        2 g 200 mL/hr over 30 Minutes Intravenous  Once 11/06/22 0640 11/06/22 0740        Assessment/Plan POD 2, s/p lap chole by Dr. Dwain Sarna 7/28 for gallstone pancreatitis and chronic cholecystitis - difficult gallbladder.  Endoloop around cystic duct. - some blood loss, hgb 11.2 yesterday, VSS - LFTs trending down overall. Alk Phos elevated to 520 but pt reports chronically elevated Alk Phos and clinically appears well - cont to mobilize - cont soft diet - added miralax this AM, ok for DC this afternoon if tolerating lunch without nausea or vomiting. Discussed with primary attending   FEN - soft VTE - lovenox ID - zosyn completed    LOS: 4 days    Juliet Rude , [facility] 11/10/2022, 10:43 AM Please see Amion for pager number during day hours  7:00am-4:30pm or 7:00am -11:30am on weekends

## 2022-11-10 NOTE — Discharge Instructions (Signed)

## 2022-11-10 NOTE — Progress Notes (Signed)
DISCHARGE NOTE HOME KHLOI SPEARE to be discharged Home per MD order. Discussed prescriptions and follow up appointments with the patient. Prescriptions given to patient; medication list explained in detail. Patient verbalized understanding.  Skin clean, dry and intact without evidence of skin break down, no evidence of skin tears noted. IV catheter discontinued intact. Site without signs and symptoms of complications. Dressing and pressure applied. Pt denies pain at the site currently. No complaints noted.  Patient free of lines, drains, and wounds.   An After Visit Summary (AVS) was printed and given to the patient. Patient escorted via wheelchair, and discharged home via private auto.  Irwin Brakeman, RN

## 2022-11-10 NOTE — Plan of Care (Signed)
  Problem: Clinical Measurements: Goal: Ability to maintain clinical measurements within normal limits will improve Outcome: Progressing Goal: Postoperative complications will be avoided or minimized Outcome: Progressing   Problem: Skin Integrity: Goal: Demonstration of wound healing without infection will improve Outcome: Progressing

## 2022-11-10 NOTE — Plan of Care (Signed)
  Problem: Education: Goal: Ability to describe self-care measures that may prevent or decrease complications (Diabetes Survival Skills Education) will improve Outcome: Completed/Met Goal: Individualized Educational Video(s) Outcome: Not Applicable   

## 2022-11-10 NOTE — Discharge Summary (Signed)
Physician Discharge Summary  [name] DGL:[id number] DOB: 08-01-1965 DOA: 11/06/2022  PCP: [place], Betty G, MD  Admit date: 11/06/2022 Discharge date: 11/10/2022  Admitted From: home Discharge disposition: home   Recommendations for Outpatient Follow-Up:   Patient    Discharge Diagnosis:   Principal Problem:   Acute pancreatitis Active Problems:   Essential hypertension, benign   Diabetes mellitus type II, non insulin dependent (HCC)   Morbid obesity (HCC)   Elevated liver enzymes    Discharge Condition: Improved.  Diet recommendation: soft  Wound care: None.  Code status: Full.   History of Present Illness:   [name] is a 57 year old F with PMH of NIDDM-2, HTN, HLD, morbid obesity and DDD presenting with acute abdominal pain.   Patient reports acute pain across upper abdomen radiating to her back after dinner about 7:30 PM.  Pain is now about 11 PM and she went to bed.  She woke up about 2 AM and did not have pain for a few seconds.  Then, she had severe 10/10 pain that prompted her to go to ED at [facility].  Describes the pain as cramping.  Rates her pain 10/10 before she went to ED.  Currently pain is down to 3/10.   Had GB removed.  LFTs evaluated on POD 1.     Hospital Course by Problem:   Acute pancreatitis: Likely related to gallstone.  Presents with acute abdominal pain radiating to her back.    CT abdomen and pelvis with cholelithiasis and possible acute cholecystitis but no radiologic evidence of acute pancreatitis.  She has no leukocytosis or fever.  She has mildly elevated LFT as well.  Pain improved.  Probably passed some stone.  She denies drinking alcohol. -s/p cholecystectomy -LFTs next week   Elevated liver enzymes: Likely due to the above. -outpatient follow up   Controlled non-insulin-dependent diabetes: On Ozempic at home -resume once BMs normal   Essential hypertension: Normotensive.  On low-dose Coreg at  home. -resumed   Morbid obesity:   History of sleeve gastrectomy. Estimated body mass index is 35.51 kg/m as calculated from the following:   Height as of this encounter: 5' 4.02" (1.626 m).   Weight as of this encounter: 93.9 kg.    Hypokalemia -repleted      Medical Consultants:    GS  Discharge Exam:   Vitals:   11/10/22 0457 11/10/22 0927  BP: 123/76 129/74  Pulse: 89 80  Resp: 18 18  Temp: 98.6 F (37 C) 98.5 F (36.9 C)  SpO2: 100% 100%   Vitals:   11/09/22 1540 11/09/22 2017 11/10/22 0457 11/10/22 0927  BP: 111/75 (!) 142/81 123/76 129/74  Pulse: 75 74 89 80  Resp: 18 18 18 18   Temp: 98.2 F (36.8 C) 98.3 F (36.8 C) 98.6 F (37 C) 98.5 F (36.9 C)  TempSrc: Oral  Oral   SpO2: 100% 100% 100% 100%  Weight:      Height:        General exam: Appears calm and comfortable.   The results of significant diagnostics from this hospitalization (including imaging, microbiology, ancillary and laboratory) are listed below for reference.     Procedures and Diagnostic Studies:   US Abdomen Limited RUQ (LIVER/GB)  Result Date: 11/06/2022 CLINICAL DATA:  Acute cholecystitis.  Abdominal pain. EXAM: ULTRASOUND ABDOMEN LIMITED RIGHT UPPER QUADRANT COMPARISON:  CT earlier today FINDINGS: Gallbladder: Moderately distended. Shadowing 1.9 cm gallstone proximally. There is mild gallbladder wall  thickening at 4 mm. Small amount of pericholecystic fluid. No sonographic Murphy sign noted by sonographer. Common bile duct: Diameter: 4-5 mm, normal. Liver: No focal lesion identified. Mildly increased in parenchymal echogenicity. Portal vein is patent on color Doppler imaging with normal direction of blood flow towards the liver. Other: None. IMPRESSION: 1. Moderately distended gallbladder with gallstone, wall thickening and pericholecystic fluid. Findings are consistent with acute cholecystitis in the appropriate clinical setting. 2. Minimal hepatic steatosis. Acute Colles please  pick the correct US ABDOMEN LIMITED template. Electronically Signed   By: Narda Rutherford M.D.   On: 11/06/2022 14:11   CT ABDOMEN PELVIS W CONTRAST  Result Date: 11/06/2022 CLINICAL DATA:  Pancreatitis suspected. Severe abdominal pain. History of gallstones. EXAM: CT ABDOMEN AND PELVIS WITH CONTRAST TECHNIQUE: Multidetector CT imaging of the abdomen and pelvis was performed using the standard protocol following bolus administration of intravenous contrast. RADIATION DOSE REDUCTION: This exam was performed according to the departmental dose-optimization program which includes automated exposure control, adjustment of the mA and/or kV according to patient size and/or use of iterative reconstruction technique. CONTRAST:  OMNIPAQUE IOHEXOL 300 MG/ML  SOLN COMPARISON:  Only relevant CT comparison is CT of pelvis with IV and oral contrast 08/08/2014. There is also a right upper quadrant ultrasound available from 08/19/2020. FINDINGS: Lower chest: Mild elevation of the right hemidiaphragm. Lung bases are clear. The cardiac size is normal. Hepatobiliary: The liver slightly steatotic with homogeneous enhancement. No mass is seen. There is mild dilatation of the gallbladder. There is a least 1 intraluminal stone, measuring 1.8 cm. Possible there could be others but this is the only stone which has a calcified rim. Some images appear to show slight thickening of the gallbladder wall over portions and trace pericholecystic fluid. Findings are concerning for cholecystitis. Pancreas: There are no inflammatory changes, mass enhancement or ductal dilatation. Spleen: There is a hypodense 1 cm lesion in the medial aspect of the spleen, Hounsfield density is 25 finding is indeterminate by CT density. Rest of the spleen is homogeneous. Adrenals/Urinary Tract: There is no adrenal mass. Both kidneys demonstrate a few scattered small subcentimeter hypodensities which are too small to characterize but statistically most likely  representing cysts. No follow-up imaging is recommended [facility] 2018 Feb; 264-273, Management of the Incidental Renal Mass on CT, [employer] 2021; 814-848, Bosniak Classification of Cystic Renal Masses, Version 2019). There is no urinary stone or obstruction. The bladder unremarkable for the degree of distention. Stomach/Bowel: Evidence of prior sleeve gastrectomy. The gastric wall otherwise unremarkable. No small bowel obstruction or inflammatory changes are seen. The normal appendix is well visible. There is scattered uncomplicated colonic diverticulosis. Vascular/Lymphatic: No significant vascular findings are present. No enlarged abdominal or pelvic lymph nodes. Multiple pelvic phleboliths. Reproductive: Status post hysterectomy. No adnexal masses. Other: No abdominal wall hernia or abnormality. No abdominopelvic ascites. No acute inflammatory process. Musculoskeletal: Moderate lower lumbar facet hypertrophy. Mild symmetric sacroiliitis. IMPRESSION: 1. Cholelithiasis with CT findings concerning for cholecystitis. Ultrasound may be helpful to confirm. 2. Mild hepatic steatosis. 3. 1 cm indeterminate hypodense lesion in the medial spleen. Statistically this is most likely a benign lesion such as a hemangioma or lymphangioma. 4. Diverticulosis without evidence of diverticulitis. 5. Mild symmetric sacroiliitis, unchanged compared to the 2016 pelvic CT. Electronically Signed   By: Almira Bar M.D.   On: 11/06/2022 05:54     Labs:   Basic Metabolic Panel: Recent Labs  Lab 11/06/22 1229 11/07/22 0138 11/08/22 0040 11/09/22 0626 11/10/22 9485  NA 138 137 141 137 136  K 2.8* 3.4* 3.9 3.6 3.3*  CL 98 104 108 105 105  CO2 30 25 26 26 24   GLUCOSE 79 96 98 86 80  BUN 18 12 9 6  5*  CREATININE 1.17* 1.24* 1.12* 1.11* 1.04*  CALCIUM 8.9 8.3* 9.1 8.9 8.7*  MG 1.9  --   --   --   --   PHOS 3.1  --   --   --   --    GFR Estimated Creatinine Clearance: 67.9 mL/min (A) (by C-G formula based on SCr of  1.04 mg/dL (H)). Liver Function Tests: Recent Labs  Lab 11/06/22 1229 11/07/22 0138 11/08/22 0040 11/09/22 0218 11/10/22 0026  AST 156* 87* 59* 181* 86*  ALT 137* 103* 95* 152* 126*  ALKPHOS 424* 404* 447* 489* 520*  BILITOT 1.1 0.7 0.6 2.1* 1.5*  PROT 6.7 5.7* 6.9 6.0* 6.0*  ALBUMIN 3.2* 2.7* 3.2* 2.7* 2.7*   Recent Labs  Lab 11/06/22 0330 11/06/22 1229 11/07/22 0138  LIPASE 4,614* 1,519* 269*   No results for input(s): "AMMONIA" in the last 168 hours. Coagulation profile No results for input(s): "INR", "PROTIME" in the last 168 hours.  CBC: Recent Labs  Lab 11/06/22 0330 11/06/22 1229 11/08/22 0040 11/09/22 0218  WBC 7.2 4.5 6.5 6.6  NEUTROABS 3.2 2.1  --   --   HGB 13.2 12.5 12.0 11.2*  HCT 40.1 37.4 37.3 34.9*  MCV 80.2 81.8 83.8 81.5  PLT 222 206 231 217   Cardiac Enzymes: No results for input(s): "CKTOTAL", "CKMB", "CKMBINDEX", "TROPONINI" in the last 168 hours. BNP: Invalid input(s): "POCBNP" CBG: Recent Labs  Lab 11/09/22 0406 11/09/22 0729 11/09/22 1119 11/09/22 1643 11/09/22 2016  GLUCAP 80 97 80 74 83   D-Dimer No results for input(s): "DDIMER" in the last 72 hours. Hgb A1c No results for input(s): "HGBA1C" in the last 72 hours. Lipid Profile No results for input(s): "CHOL", "HDL", "LDLCALC", "TRIG", "CHOLHDL", "LDLDIRECT" in the last 72 hours. Thyroid function studies No results for input(s): "TSH", "T4TOTAL", "T3FREE", "THYROIDAB" in the last 72 hours.  Invalid input(s): "FREET3" Anemia work up No results for input(s): "VITAMINB12", "FOLATE", "FERRITIN", "TIBC", "IRON", "RETICCTPCT" in the last 72 hours. Microbiology Recent Results (from the past 240 hour(s))  Surgical PCR screen     Status: None   Collection Time: 11/08/22 12:08 AM   Specimen: Nasal Mucosa; Nasal Swab  Result Value Ref Range Status   MRSA, PCR NEGATIVE NEGATIVE Final   Staphylococcus aureus NEGATIVE NEGATIVE Final    Comment: (NOTE) The Xpert SA Assay (FDA  approved for NASAL specimens in patients 37 years of age and older), is one component of a comprehensive surveillance program. It is not intended to diagnose infection nor to guide or monitor treatment. Performed at [facility], [address], [place], [place] [address]      Discharge Instructions:   Discharge Instructions     Discharge instructions   Complete by: As directed    Be sure to use miralax for soft BMs and walk Would hold ozempic until having regular BMs Soft diet CMP (lab) in 1 week   Increase activity slowly   Complete by: As directed       Allergies as of 11/10/2022       Reactions   Dilaudid [hydromorphone] Itching        Medication List     TAKE these medications    Accu-Chek Guide w/Device Kit As directed.  acetaminophen 500 MG tablet Commonly known as: TYLENOL Take 1,000 mg by mouth every 8 (eight) hours.   carvedilol 3.125 MG tablet Commonly known as: COREG Take 1 tablet (3.125 mg total) by mouth 2 (two) times daily with a meal.   ezetimibe 10 MG tablet Commonly known as: Zetia Take 1 tablet (10 mg total) by mouth daily.   ibuprofen 600 MG tablet Commonly known as: ADVIL Take 600 mg by mouth every 6 (six) hours as needed for mild pain.   indapamide 1.25 MG tablet Commonly known as: LOZOL Take 1 tablet (1.25 mg total) by mouth daily.   meclizine 25 MG tablet Commonly known as: ANTIVERT Take 1 tablet (25 mg total) by mouth daily as needed for dizziness.   oxyCODONE 5 MG immediate release tablet Commonly known as: Oxy IR/ROXICODONE Take 1 tablet (5 mg total) by mouth every 4 (four) hours as needed for moderate pain.   Ozempic (0.25 or 0.5 MG/DOSE) 2 MG/3ML Sopn Generic drug: Semaglutide(0.25 or 0.5MG /DOS) Inject 0.5 mg into the skin once a week. Start taking on: November 19, 2022 What changed: These instructions start on November 19, 2022. If you are unsure what to do until then, ask your doctor or other care provider.    polyethylene glycol 17 g packet Commonly known as: MIRALAX / GLYCOLAX Take 17 g by mouth daily.   tiZANidine 2 MG tablet Commonly known as: ZANAFLEX Take 1 tablet (2 mg total) by mouth 2 (two) times daily as needed for muscle spasms.        Follow-up Information     Maczis, Hedda Slade, PA-C Follow up in 3 week(s).   Specialty: General Surgery Why: Arrive 30 minutes prior to your appointment time, Please bring your insurance card and photo ID Contact information: [address] [facility] [place] [address] [phone]                  Time coordinating discharge: 45 min  Signed:  Joseph Art DO  [facility] 11/10/2022, 1:29 PM

## 2022-11-11 ENCOUNTER — Telehealth: Payer: Self-pay

## 2022-11-11 NOTE — Transitions of Care (Post Inpatient/ED Visit) (Signed)
11/11/2022  Name: [name] MRN: [id number] DOB: [birth year]  Today's TOC FU Call Status: Today's TOC FU Call Status:: Successful TOC FU Call Completed TOC FU Call Complete Date: 11/11/22  Transition Care Management Follow-up Telephone Call Date of Discharge: 11/10/22 Discharge Facility: [facility]) Type of Discharge: Inpatient Admission Primary Inpatient Discharge Diagnosis:: "cholecystitis" How have you been since you were released from the hospital?: Better (Pt reported she rested well last night-last took pain med at 2:30am-current pain level 3/10. She has been up walking & moving around. Appetite good-following soft diet. No BM since surgery-started taking Miralax today-states she is passing gas) Any questions or concerns?: No  Items Reviewed: Did you receive and understand the discharge instructions provided?: Yes Medications obtained,verified, and reconciled?: Yes (Medications Reviewed) Any new allergies since your discharge?: No Dietary orders reviewed?: Yes Type of Diet Ordered:: reviewed diet instructions per d/c instructions Do you have support at home?: Yes  Medications Reviewed Today: Medications Reviewed Today     Reviewed by Charlyn Minerva, RN (Registered Nurse) on 11/11/22 at 1309  Med List Status: <None>   Medication Order Taking? Sig Documenting Provider Last Dose Status Informant  acetaminophen (TYLENOL) 500 MG tablet [id number] Yes Take 1,000 mg by mouth every 8 (eight) hours. [provider] Taking Active Self, Pharmacy Records  Blood Glucose Monitoring Suppl (ACCU-CHEK AVIVA PLUS) w/Device KIT [id number] Yes As directed. [place], Betty G, MD Taking Active Self, Pharmacy Records  carvedilol (COREG) 3.125 MG tablet [id number] Yes Take 1 tablet (3.125 mg total) by mouth 2 (two) times daily with a meal. [place], Betty G, MD Taking Active Self, Pharmacy Records  ezetimibe (ZETIA) 10 MG tablet [id number] Yes Take 1 tablet (10 mg total) by  mouth daily. [place], Betty G, MD Taking Active Self, Pharmacy Records  ibuprofen (ADVIL) 600 MG tablet [id number]  Take 600 mg by mouth every 6 (six) hours as needed for mild pain. [provider]  Active Self, Pharmacy Records  indapamide (LOZOL) 1.25 MG tablet [id number] Yes Take 1 tablet (1.25 mg total) by mouth daily. [place], Betty G, MD Taking Active Self, Pharmacy Records  meclizine (ANTIVERT) 25 MG tablet [id number] Yes Take 1 tablet (25 mg total) by mouth daily as needed for dizziness. [place], Betty G, MD Taking Active Self, Pharmacy Records  oxyCODONE (OXY IR/ROXICODONE) 5 MG immediate release tablet [id number] Yes Take 1 tablet (5 mg total) by mouth every 4 (four) hours as needed for moderate pain. Juliet Rude, PA-C Taking Active   polyethylene glycol (MIRALAX / GLYCOLAX) 17 g packet [id number] Yes Take 17 g by mouth daily. Joseph Art, DO Taking Active   Semaglutide,0.25 or 0.5MG /DOS, (OZEMPIC, 0.25 OR 0.5 MG/DOSE,) 2 MG/3ML SOPN [id number]  Inject 0.5 mg into the skin once a week. Joseph Art, DO  Active   tiZANidine (ZANAFLEX) 2 MG tablet [id number] Yes Take 1 tablet (2 mg total) by mouth 2 (two) times daily as needed for muscle spasms. [place], Betty G, MD Taking Active Self, Pharmacy Records            Home Care and Equipment/Supplies: Were Home Health Services Ordered?: NA Any new equipment or medical supplies ordered?: NA  Functional Questionnaire: Do you need assistance with bathing/showering or dressing?: No Do you need assistance with meal preparation?: No Do you need assistance with eating?: No Do you have difficulty maintaining continence: No Do you need assistance with getting out of bed/getting out of a chair/moving?: No Do you have difficulty managing  or taking your medications?: No  Follow up appointments reviewed: PCP Follow-up appointment confirmed?: No (pt preferred to call office on her own later today to make appt and schedule lab work) MD  Provider Line Number:[phone] Given: No Specialist Hospital Follow-up appointment confirmed?: Yes Date of Specialist follow-up appointment?: 12/01/22 Follow-Up Specialty Provider:: Puja Maczis,PA(surgeon) Do you need transportation to your follow-up appointment?: No Do you understand care options if your condition(s) worsen?: Yes-patient verbalized understanding  SDOH Interventions Today    Flowsheet Row Most Recent Value  SDOH Interventions   Food Insecurity Interventions Intervention Not Indicated  Transportation Interventions Intervention Not Indicated      TOC Interventions Today    Flowsheet Row Most Recent Value  TOC Interventions   TOC Interventions Discussed/Reviewed TOC Interventions Discussed, Post discharge activity limitations per provider, Post op wound/incision care, S/S of infection      Interventions Today    Flowsheet Row Most Recent Value  General Interventions   General Interventions Discussed/Reviewed General Interventions Discussed, Doctor Visits  Doctor Visits Discussed/Reviewed Doctor Visits Discussed, PCP, Specialist  PCP/Specialist Visits Compliance with follow-up visit  Education Interventions   Education Provided Provided Education  Provided Verbal Education On Nutrition, Medication, Other, When to see the doctor  [pain mgmt, bowel mgmt, post op care]  Nutrition Interventions   Nutrition Discussed/Reviewed Nutrition Discussed, Fluid intake, Adding fruits and vegetables  Pharmacy Interventions   Pharmacy Dicussed/Reviewed Pharmacy Topics Discussed, Medications and their functions  Safety Interventions   Safety Discussed/Reviewed Safety Discussed       Alessandra Grout [facility] Care Management Care Management Community Coordinator Direct Phone: [phone] Toll Free: [phone] Fax: [phone]

## 2022-12-01 DIAGNOSIS — K819 Cholecystitis, unspecified: Secondary | ICD-10-CM | POA: Diagnosis not present

## 2022-12-17 ENCOUNTER — Other Ambulatory Visit (HOSPITAL_COMMUNITY): Payer: Self-pay

## 2022-12-17 ENCOUNTER — Other Ambulatory Visit: Payer: Self-pay | Admitting: Family Medicine

## 2022-12-17 DIAGNOSIS — I10 Essential (primary) hypertension: Secondary | ICD-10-CM

## 2022-12-18 ENCOUNTER — Other Ambulatory Visit (HOSPITAL_COMMUNITY): Payer: Self-pay

## 2022-12-18 MED ORDER — INDAPAMIDE 1.25 MG PO TABS
1.2500 mg | ORAL_TABLET | Freq: Every day | ORAL | 2 refills | Status: DC
  Filled 2022-12-18: qty 90, 90d supply, fill #0
  Filled 2023-03-27 – 2023-04-09 (×2): qty 90, 90d supply, fill #1
  Filled 2023-08-01: qty 90, 90d supply, fill #2

## 2022-12-18 MED ORDER — CARVEDILOL 3.125 MG PO TABS
3.1250 mg | ORAL_TABLET | Freq: Two times a day (BID) | ORAL | 2 refills | Status: DC
Start: 2022-12-18 — End: 2023-05-19
  Filled 2022-12-18: qty 180, 90d supply, fill #0

## 2023-01-18 ENCOUNTER — Other Ambulatory Visit (HOSPITAL_COMMUNITY): Payer: Self-pay

## 2023-01-18 ENCOUNTER — Telehealth: Payer: Self-pay

## 2023-01-18 NOTE — Telephone Encounter (Signed)
Pharmacy Patient Advocate Encounter  Insurance verification completed.    The patient is insured through [facility]   Ran test claim for [facility]. Currently a quantity of is a 28 day supply and the co-pay is 24.99 .   This test claim was processed through [facility]- copay amounts may vary at other pharmacies due to pharmacy/plan contracts, or as the patient moves through the different stages of their insurance plan.

## 2023-01-22 ENCOUNTER — Encounter: Payer: Self-pay | Admitting: Family Medicine

## 2023-02-10 NOTE — Progress Notes (Unsigned)
HPI: [name] is a 57 y.o. female with a PMHx significant for HTN, chronic venous insufficiency, HLD, DM II, benign positional vertigo, OA, anemia, and vitamin D deficiency, among others, who is here today for chronic disease management.  Last seen on 08/28/2022  She had a cholecystectomy on 7/28. She has not had any abdominal pain, nausea, or vomiting, and is no longer following with her surgeon.  Lab Results  Component Value Date   ALT 126 (H) 11/10/2022   AST 86 (H) 11/10/2022   ALKPHOS 520 (H) 11/10/2022   BILITOT 1.5 (H) 11/10/2022   *** Hypertension:  Medications: Currently on carvedilol 3.125 mg bid.  BP readings at home: She has been checking her BP at home. She says yesterday it was 117/80.  Side effects: She notes she had a severe headache yesterday for the first time in many years.   Negative for visual changes, exertional chest pain, palpitations, dyspnea, focal weakness, or edema.  Lab Results  Component Value Date   NA 136 11/10/2022   CL 105 11/10/2022   K 3.3 (L) 11/10/2022   CO2 24 11/10/2022   BUN 5 (L) 11/10/2022   CREATININE 1.04 (H) 11/10/2022   GFRNONAA >60 11/10/2022   CALCIUM 8.7 (L) 11/10/2022   PHOS 3.1 11/06/2022   ALBUMIN 2.7 (L) 11/10/2022   GLUCOSE 80 11/10/2022   Hyperlipidemia: Currently on Zetia 10 mg daily because she was having muscle aches with statins.   Lab Results  Component Value Date   CHOL 205 (H) 11/06/2022   HDL 80 11/06/2022   LDLCALC 115 (H) 11/06/2022   TRIG 51 11/06/2022   CHOLHDL 2.6 11/06/2022   Diabetes Mellitus II:  - Checking BG at home: She has not been checking at home because she does not have needles for her glucometer.  - Medications: She has not been taking Ozempic since her surgery on 7/28.  - Diet: She says her diet has not been very good lately, and she has been snacking a lot.  - eye exam: UTD, her last exam was in 06/2022. - foot exam: UTD, performed here in 08/2022. - Negative for symptoms  of hypoglycemia, polyuria, polydipsia, numbness extremities, foot ulcers/trauma  Lab Results  Component Value Date   HGBA1C 5.6 08/28/2022   Lab Results  Component Value Date   MICROALBUR 0.8 03/30/2022   Concerns today:  She asks today about a lipoma on her back.   She also mentions she is having some right foot pain, and believes something is wrong while she is walking. She had a fracture in that foot in the past.   Review of Systems  Constitutional:  Negative for chills and fever.  HENT:  Negative for mouth sores and sore throat.   Respiratory:  Negative for cough and wheezing.   Genitourinary:  Negative for decreased urine volume, dysuria and hematuria.  Skin:  Negative for rash.  Neurological:  Negative for syncope and facial asymmetry.  See other pertinent positives and negatives in HPI.  Current Outpatient Medications on File Prior to Visit  Medication Sig Dispense Refill   acetaminophen (TYLENOL) 500 MG tablet Take 1,000 mg by mouth every 8 (eight) hours.     Blood Glucose Monitoring Suppl (ACCU-CHEK AVIVA PLUS) w/Device KIT As directed. 1 kit 0   carvedilol (COREG) 3.125 MG tablet Take 1 tablet (3.125 mg total) by mouth 2 (two) times daily with a meal. 180 tablet 2   ezetimibe (ZETIA) 10 MG tablet Take 1 tablet (10  mg total) by mouth daily. 90 tablet 3   ibuprofen (ADVIL) 600 MG tablet Take 600 mg by mouth every 6 (six) hours as needed for mild pain.     indapamide (LOZOL) 1.25 MG tablet Take 1 tablet (1.25 mg total) by mouth daily. 90 tablet 2   meclizine (ANTIVERT) 25 MG tablet Take 1 tablet (25 mg total) by mouth daily as needed for dizziness. 30 tablet 0   oxyCODONE (OXY IR/ROXICODONE) 5 MG immediate release tablet Take 1 tablet (5 mg total) by mouth every 4 (four) hours as needed for moderate pain. 15 tablet 0   polyethylene glycol (MIRALAX / GLYCOLAX) 17 g packet Take 17 g by mouth daily.     Semaglutide,0.25 or 0.5MG /DOS, (OZEMPIC, 0.25 OR 0.5 MG/DOSE,) 2 MG/3ML  SOPN Inject 0.5 mg into the skin once a week.     tiZANidine (ZANAFLEX) 2 MG tablet Take 1 tablet (2 mg total) by mouth 2 (two) times daily as needed for muscle spasms. 60 tablet 1   No current facility-administered medications on file prior to visit.    Past Medical History:  Diagnosis Date   Anemia    Back pain    Clotting disorder (HCC)    Diabetes mellitus without complication (HCC)    Edema 04/25/2015   Headache(784.0)    Herniated disc    History of colonic polyps    Hypertension    Joint pain    Obesity    Osteoarthritis    TIA (transient ischemic attack) 2007   hx of   Vasculitis (HCC)    L leg - Polyarthritis   Venous insufficiency    Vitamin D deficiency    Allergies  Allergen Reactions   Dilaudid [Hydromorphone] Itching    Social History   Socioeconomic History   Marital status: Married    Spouse name: Ethelene Browns   Number of children: Not on file   Years of education: Not on file   Highest education level: Not on file  Occupational History   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  Tobacco Use   Smoking status: Never   Smokeless tobacco: Never  Vaping Use   Vaping status: Never Used  Substance and Sexual Activity   Alcohol use: Yes    Alcohol/week: 0.0 standard drinks of alcohol    Comment: rarely   Drug use: No   Sexual activity: Yes    Birth control/protection: Surgical  Other Topics Concern   Not on file  Social History Narrative   Not on file   Social Determinants of Health   Financial Resource Strain: Not on file  Food Insecurity: No Food Insecurity (11/11/2022)   Hunger Vital Sign    Worried About Running Out of Food in the Last Year: Never true    Ran Out of Food in the Last Year: Never true  Transportation Needs: No Transportation Needs (11/11/2022)   PRAPARE - Administrator, Civil Service (Medical): No    Lack of Transportation (Non-Medical): No  Physical Activity: Not on file  Stress: Not on file  Social Connections: Unknown  (10/12/2022)   Received from [facility]   Social Network    Social Network: Not on file    Vitals:   02/12/23 0832  BP: 128/80  Pulse: 64  Resp: 16  Temp: 98.5 F (36.9 C)  SpO2: 98%   Wt Readings from Last 3 Encounters:  02/12/23 225 lb 2 oz (102.1 kg)  11/08/22 207 lb (93.9 kg)  08/28/22 208 lb 4 oz (  94.5 kg)   Body mass index is 38.62 kg/m.  Physical Exam Vitals and nursing note reviewed.  Constitutional:      General: She is not in acute distress.    Appearance: She is well-developed.  HENT:     Head: Normocephalic and atraumatic.     Mouth/Throat:     Mouth: Mucous membranes are moist.     Pharynx: Oropharynx is clear.  Eyes:     Conjunctiva/sclera: Conjunctivae normal.  Neck:   Cardiovascular:     Rate and Rhythm: Normal rate and regular rhythm.     Pulses:          Dorsalis pedis pulses are 2+ on the right side and 2+ on the left side.     Heart sounds: No murmur heard. Pulmonary:     Effort: Pulmonary effort is normal. No respiratory distress.     Breath sounds: Normal breath sounds.  Abdominal:     Palpations: Abdomen is soft. There is no hepatomegaly or mass.     Tenderness: There is no abdominal tenderness.  Musculoskeletal:     Right lower leg: No edema.     Left lower leg: No edema.  Lymphadenopathy:     Cervical: No cervical adenopathy.  Skin:    General: Skin is warm.     Findings: No erythema or rash.  Neurological:     General: No focal deficit present.     Mental Status: She is alert and oriented to person, place, and time.     Cranial Nerves: No cranial nerve deficit.     Gait: Gait normal.  Psychiatric:        Mood and Affect: Mood and affect normal.    ASSESSMENT AND PLAN:  Ms. Murin was seen today for chronic disease management.   Orders Placed This Encounter  Procedures   Microalbumin / creatinine urine ratio   Basic metabolic panel   Hepatic function panel   Hemoglobin A1c  *** Type 2 diabetes mellitus with other  specified complication, without long-term current use of insulin (HCC) Assessment & Plan: HgA1C has been at goal, last one 5.6 in 08/2022 at that time she was on Ozempic 0.5 mg weekly but has not taken it since 10/2022. Continue nonpharmacologic treatment. Regular exercise and healthy diet with avoidance of added sugar food intake is an important part of treatment and recommended. Annual eye exam, periodic dental and foot care to continue. F/U in 5-6 months.  Orders: -     Accu-Chek Guide; Use to test blood sugar daily.  Dispense: 100 each; Refill: 12 -     Accu-Chek Softclix Lancets; Use to check blood sugar daily.  Dispense: 100 each; Refill: 12 -     Microalbumin / creatinine urine ratio; Future -     Hemoglobin A1c; Future  Essential hypertension, benign Assessment & Plan: Reporting lower BP's at home. Continue carvedilol 3.125 mg twice daily and low-salt diet. Continue monitoring BP regularly. Eye exam is current.  Orders: -     Basic metabolic panel; Future  Abnormal LFTs -     Hepatic function panel; Future  Hyperlipidemia associated with type 2 diabetes mellitus (HCC) Assessment & Plan: Last LDL 115 in 10/2022. She has not tolerated statins in the past. Continue Zetia 10 mg daily and low-fat diet. We will plan on checking lipid panel at next visit.   Neck mass  *** Return in about 6 months (around 08/12/2023) for chronic problems.  Trula Ore, acting as a  scribe for Laquashia Mergenthaler [place], MD., have documented all relevant documentation on the behalf of Merian Wroe [place], MD, as directed by  Braelynn Lupton [place], MD while in the presence of Wilmoth Rasnic [place], MD.   I, Risa Auman [place], MD, have reviewed all documentation for this visit. The documentation on 02/12/23 for the exam, diagnosis, procedures, and orders are all accurate and complete.  Makayla Confer G. [place], MD  [facility]. [place] office.

## 2023-02-12 ENCOUNTER — Encounter: Payer: Self-pay | Admitting: Family Medicine

## 2023-02-12 ENCOUNTER — Other Ambulatory Visit (HOSPITAL_COMMUNITY): Payer: Self-pay

## 2023-02-12 ENCOUNTER — Ambulatory Visit: Payer: BC Managed Care – PPO | Admitting: Family Medicine

## 2023-02-12 VITALS — BP 128/80 | HR 64 | Temp 98.5°F | Resp 16 | Ht 64.02 in | Wt 225.1 lb

## 2023-02-12 DIAGNOSIS — E1169 Type 2 diabetes mellitus with other specified complication: Secondary | ICD-10-CM | POA: Diagnosis not present

## 2023-02-12 DIAGNOSIS — R221 Localized swelling, mass and lump, neck: Secondary | ICD-10-CM | POA: Diagnosis not present

## 2023-02-12 DIAGNOSIS — R7989 Other specified abnormal findings of blood chemistry: Secondary | ICD-10-CM | POA: Diagnosis not present

## 2023-02-12 DIAGNOSIS — I10 Essential (primary) hypertension: Secondary | ICD-10-CM

## 2023-02-12 DIAGNOSIS — E785 Hyperlipidemia, unspecified: Secondary | ICD-10-CM

## 2023-02-12 LAB — HEPATIC FUNCTION PANEL
ALT: 30 U/L (ref 0–35)
AST: 32 U/L (ref 0–37)
Albumin: 4 g/dL (ref 3.5–5.2)
Alkaline Phosphatase: 356 U/L — ABNORMAL HIGH (ref 39–117)
Bilirubin, Direct: 0 mg/dL (ref 0.0–0.3)
Total Bilirubin: 0.4 mg/dL (ref 0.2–1.2)
Total Protein: 7.5 g/dL (ref 6.0–8.3)

## 2023-02-12 LAB — BASIC METABOLIC PANEL
BUN: 19 mg/dL (ref 6–23)
CO2: 31 meq/L (ref 19–32)
Calcium: 9.6 mg/dL (ref 8.4–10.5)
Chloride: 103 meq/L (ref 96–112)
Creatinine, Ser: 1.1 mg/dL (ref 0.40–1.20)
GFR: 56.37 mL/min — ABNORMAL LOW (ref >60.00)
Glucose, Bld: 88 mg/dL (ref 70–99)
Potassium: 3.5 meq/L (ref 3.5–5.1)
Sodium: 140 meq/L (ref 135–145)

## 2023-02-12 LAB — HEMOGLOBIN A1C: Hgb A1c MFr Bld: 5.9 % (ref 4.6–6.5)

## 2023-02-12 LAB — MICROALBUMIN / CREATININE URINE RATIO
Creatinine,U: 173.1 mg/dL
Microalb Creat Ratio: 0.4 mg/g (ref 0.0–30.0)
Microalb, Ur: <0.7 mg/dL (ref 0.0–1.9)

## 2023-02-12 MED ORDER — ACCU-CHEK SOFTCLIX LANCETS MISC
12 refills | Status: AC
Start: 2023-02-12 — End: ?
  Filled 2023-02-12: qty 100, 90d supply, fill #0

## 2023-02-12 MED ORDER — ACCU-CHEK GUIDE VI STRP
ORAL_STRIP | 12 refills | Status: AC
Start: 2023-02-12 — End: ?
  Filled 2023-02-12: qty 50, 50d supply, fill #0

## 2023-02-12 NOTE — Assessment & Plan Note (Signed)
Reporting lower BP's at home. Continue carvedilol 3.125 mg twice daily and low-salt diet. Continue monitoring BP regularly. Eye exam is current.

## 2023-02-12 NOTE — Patient Instructions (Signed)
A few things to remember from today's visit:  Type 2 diabetes mellitus with other specified complication, without long-term current use of insulin (HCC) - Plan: glucose blood (ACCU-CHEK GUIDE) test strip, Accu-Chek Softclix Lancets lancets, Microalbumin / creatinine urine ratio, Hemoglobin A1c  Essential hypertension, benign - Plan: Basic metabolic panel  Abnormal LFTs - Plan: Hepatic function panel No changes today.  If you need refills for medications you take chronically, please call your pharmacy. Do not use My Chart to request refills or for acute issues that need immediate attention. If you send a my chart message, it may take a few days to be addressed, specially if I am not in the office.  Please be sure medication list is accurate. If a new problem present, please set up appointment sooner than planned today.

## 2023-02-12 NOTE — Assessment & Plan Note (Signed)
Last LDL 115 in 10/2022. She has not tolerated statins in the past. Continue Zetia 10 mg daily and low-fat diet. We will plan on checking lipid panel at next visit.

## 2023-02-12 NOTE — Assessment & Plan Note (Addendum)
HgA1C has been at goal, last one 5.6 in 08/2022 at that time she was on Ozempic 0.5 mg weekly but has not taken it since 10/2022. Continue nonpharmacologic treatment. Regular exercise and healthy diet with avoidance of added sugar food intake is an important part of treatment and recommended. Annual eye exam, periodic dental and foot care to continue. F/U in 5-6 months.

## 2023-02-17 ENCOUNTER — Other Ambulatory Visit: Payer: Self-pay | Admitting: Family Medicine

## 2023-02-17 ENCOUNTER — Encounter: Payer: Self-pay | Admitting: Family Medicine

## 2023-02-26 ENCOUNTER — Ambulatory Visit: Payer: BC Managed Care – PPO | Admitting: Family Medicine

## 2023-03-01 ENCOUNTER — Encounter: Payer: Self-pay | Admitting: Gastroenterology

## 2023-03-03 ENCOUNTER — Encounter: Payer: Self-pay | Admitting: Family Medicine

## 2023-03-27 ENCOUNTER — Other Ambulatory Visit (HOSPITAL_COMMUNITY): Payer: Self-pay

## 2023-04-06 ENCOUNTER — Other Ambulatory Visit (HOSPITAL_COMMUNITY): Payer: Self-pay

## 2023-04-09 ENCOUNTER — Other Ambulatory Visit (HOSPITAL_COMMUNITY): Payer: Self-pay

## 2023-04-19 ENCOUNTER — Ambulatory Visit (INDEPENDENT_AMBULATORY_CARE_PROVIDER_SITE_OTHER): Payer: BC Managed Care – PPO | Admitting: Physician Assistant

## 2023-04-19 ENCOUNTER — Encounter (INDEPENDENT_AMBULATORY_CARE_PROVIDER_SITE_OTHER): Payer: Self-pay | Admitting: Physician Assistant

## 2023-04-19 VITALS — BP 114/73 | HR 74 | Temp 98.3°F | Ht 61.0 in | Wt 234.0 lb

## 2023-04-19 DIAGNOSIS — Z7985 Long-term (current) use of injectable non-insulin antidiabetic drugs: Secondary | ICD-10-CM

## 2023-04-19 DIAGNOSIS — E1169 Type 2 diabetes mellitus with other specified complication: Secondary | ICD-10-CM | POA: Diagnosis not present

## 2023-04-19 DIAGNOSIS — E785 Hyperlipidemia, unspecified: Secondary | ICD-10-CM

## 2023-04-19 DIAGNOSIS — I152 Hypertension secondary to endocrine disorders: Secondary | ICD-10-CM

## 2023-04-19 DIAGNOSIS — M5134 Other intervertebral disc degeneration, thoracic region: Secondary | ICD-10-CM

## 2023-04-19 DIAGNOSIS — R7989 Other specified abnormal findings of blood chemistry: Secondary | ICD-10-CM | POA: Diagnosis not present

## 2023-04-19 DIAGNOSIS — G720 Drug-induced myopathy: Secondary | ICD-10-CM

## 2023-04-19 DIAGNOSIS — N1831 Chronic kidney disease, stage 3a: Secondary | ICD-10-CM

## 2023-04-19 DIAGNOSIS — Z9884 Bariatric surgery status: Secondary | ICD-10-CM

## 2023-04-19 DIAGNOSIS — T466X5A Adverse effect of antihyperlipidemic and antiarteriosclerotic drugs, initial encounter: Secondary | ICD-10-CM

## 2023-04-19 DIAGNOSIS — Z0289 Encounter for other administrative examinations: Secondary | ICD-10-CM

## 2023-04-19 DIAGNOSIS — E1159 Type 2 diabetes mellitus with other circulatory complications: Secondary | ICD-10-CM
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
## 2023-05-11 ENCOUNTER — Ambulatory Visit (INDEPENDENT_AMBULATORY_CARE_PROVIDER_SITE_OTHER): Payer: BC Managed Care – PPO | Admitting: Family Medicine

## 2023-05-11 ENCOUNTER — Encounter (INDEPENDENT_AMBULATORY_CARE_PROVIDER_SITE_OTHER): Payer: Self-pay | Admitting: Family Medicine

## 2023-05-11 VITALS — BP 101/58 | HR 65 | Temp 98.4°F | Ht 61.0 in | Wt 239.0 lb

## 2023-05-11 DIAGNOSIS — Z9884 Bariatric surgery status: Secondary | ICD-10-CM

## 2023-05-11 DIAGNOSIS — Z6841 Body Mass Index (BMI) 40.0 and over, adult: Secondary | ICD-10-CM

## 2023-05-11 DIAGNOSIS — R0602 Shortness of breath: Secondary | ICD-10-CM | POA: Diagnosis not present

## 2023-05-11 DIAGNOSIS — Z7984 Long term (current) use of oral hypoglycemic drugs: Secondary | ICD-10-CM

## 2023-05-11 DIAGNOSIS — I152 Hypertension secondary to endocrine disorders: Secondary | ICD-10-CM | POA: Diagnosis not present

## 2023-05-11 DIAGNOSIS — R5383 Other fatigue: Secondary | ICD-10-CM

## 2023-05-11 DIAGNOSIS — Z1331 Encounter for screening for depression: Secondary | ICD-10-CM

## 2023-05-11 DIAGNOSIS — E782 Mixed hyperlipidemia: Secondary | ICD-10-CM | POA: Diagnosis not present

## 2023-05-11 DIAGNOSIS — E1159 Type 2 diabetes mellitus with other circulatory complications: Secondary | ICD-10-CM

## 2023-05-11 DIAGNOSIS — E1169 Type 2 diabetes mellitus with other specified complication: Secondary | ICD-10-CM | POA: Diagnosis not present

## 2023-05-11 NOTE — Progress Notes (Signed)
Carlye Grippe, D.O.  ABFM, ABOM Specializing in Clinical Bariatric Medicine Office located at: [address]  [place], [place]  [address]   Bariatric Medicine Visit  Dear [place], Betty G, MD   Thank you for referring [name] to our clinic today for evaluation.  We performed a consultation to discuss her options for treatment and educate the patient on her disease state.  The following note includes my evaluation and treatment recommendations.   Please do not hesitate to reach out to me directly if you have any further concerns.   Assessment and Plan:   FOR THE DISEASE OF OBESITY: Morbid obesity (HCC) Assessment & Plan: [name] is currently in the action stage of change. As such, her goal is to start our weight management plan.  She has agreed to: follow the Category 1 plan    Behavioral Intervention We discussed the following Behavioral Modification Strategies today: begin  to work on maintaining a reduced calorie state, getting the recommended amount of protein, incorporating whole foods, making healthy choices, staying well hydrated and practicing mindfulness when eating.  Additional resources provided today: handout on CAT 1 meal plan   Evidence-based interventions for health behavior change were utilized today including the discussion of self monitoring techniques, problem-solving barriers and SMART goal setting techniques.    Pt will specifically work on: n/a   Recommended Physical Activity Goals [name] has been advised to work up to 150 minutes of moderate intensity aerobic activity a week and strengthening exercises 2-3 times per week for cardiovascular health, weight loss maintenance and preservation of muscle mass.   She has agreed to : Continue current level of physical activity    Pharmacotherapy We both agreed to : begin nutritional and behavioral strategies   FOR ASSOCIATED CONDITIONS ADDRESSED TODAY:  Fatigue Assessment & Plan: [name]  does feel that her weight is causing her energy to be lower than it should be. Fatigue may be related to obesity, depression or many other causes. she does not appear to have any red flag symptoms and this appears to most likely be related to her current lifestyle habits and dietary intake.   Labs will be ordered and reviewed with her at their next office visit in two weeks.  Epworth sleepiness scale is 11 and does not appear to be within normal limits. [name] admits to some daytime somnolence and admits to sometimes waking up still tired. She has the daytime somnolence on days she stays up late watching movies.  [name] typically gets  7-8  hours of sleep per night, and states that she has generally restful sleep. Snoring is present. Apneic episodes is not present. Discussed with pt the importance of sleep hygiene and recommended she speak with PCP further about her ESS score.   ECG: Performed and reviewed/ interpreted independently.  Normal sinus rhythm, rate 60 bpm; reassuring without any acute abnormalities, will continue to monitor for symptoms   Orders: -     Comprehensive metabolic panel -     CBC with Differential/Platelet -     Hemoglobin A1c -     Folate -     Insulin, random -     Lipid panel -     T4, free -     TSH -     T3 -     VITAMIN D 25 Hydroxy (Vit-D Deficiency, Fractures) -     Vitamin B12   Shortness of breath on exertion Assessment & Plan: [name] does feel that  she gets out of breath more easily than she used to when she exercises and seems to be worsening over time with weight gain.  This has gotten worse recently. [name] denies shortness of breath at rest or orthopnea. [name]'s shortness of breath appears to be obesity related and exercise induced, as they do not appear to have any "red flag" symptoms/ concerns today.  Also, this condition appears to be related to a state of poor cardiovascular conditioning   Obtain labs today and will be reviewed with her at their  next office visit in two weeks.  Indirect Calorimeter completed today to help guide our dietary regimen. It shows a VO2 of 227 and a REE of 1570.  Her calculated basal metabolic rate is 4098 thus her measured basal metabolic rate is worse than expected.  Patient agreed to work on weight loss at this time.  As Mirakle progresses through our weight loss program, we will gradually increase exercise as tolerated to treat her current condition.   If Scotti follows our recommendations and loses 5-10% of their weight without improvement of her shortness of breath or if at any time, symptoms become more concerning, they agree to urgently follow up with their PCP/ specialist for further consideration/ evaluation.   [name] verbalizes agreement with this plan.   Depression screening Assessment & Plan: Her Food and Mood (modified PHQ-9) score was negative at 7. She endorses sometimes eating when bored. Overall she feels that emotional eating is not a problematic issue for her. Pt informed about our bariatric psychologist. Will continue to monitor condition.    Type 2 diabetes mellitus with obesity [facility]) Assessment & Plan: Pt reports being diagnosed with T2DM around 2015-2016. In the past, she was on Metformin therapy for a short period however the medication was discontinued d/t GI upset (loose stools). Condition managed with diet/exercise. Begin reduced calorie meal plan low on processed crabs and simple sugars.    Hypertension associated with type 2 diabetes mellitus (HCC) Assessment & Plan: Last 3 blood pressure readings in our office are as follows: BP Readings from Last 3 Encounters:  05/11/23 (!) 101/58  04/19/23 114/73  02/12/23 128/80   Pt has been on Carvedilol for several years now. She was previously on Losartan however this was discontinued due to unusual SE. Her BP is stable today - no concerns in this regard. Discussed with pt that Carvedilol can be counterproductive to weight loss -  recommend that she discuss with PCP about possible alternatives. Begin low sodium diet, advance exercise as tolerated.    Mixed diabetic hyperlipidemia associated with type 2 diabetes mellitus (HCC) Assessment & Plan: Pt currently on Zetia 10 mg daily. Pt was on Pravastatin in the past, medication was discontinued d/t side effects (body aches). She has never tried any other statin medications. Reviewed pt's most recent lipid panel. LDL was elevated at 115. Goal <70 for a diabetic. She will continue current medication regiment and start our prudent nutritional plan that is low in saturated and trans fats to improve LDL.    S/P laparoscopic sleeve gastrectomy - done on May 1st, 2018 by Dr.Kinsinger Assessment & Plan: Pt with hx of laparoscopic sleeve gastrectomy. Her heaviest prep-op weight was 302 lbs and her lowest post-op weight was 199 lbs. Will check for nutritional deficiencies and pt will start Category 1 MP. Pt is aware she may need to have multiple small meals a day to get in all her meal plan foods.    FOLLOW UP:  Follow up in 2 weeks. She was informed of the importance of frequent follow up visits to maximize her success with intensive lifestyle modifications for her multiple health conditions.  [name] is aware that we will review all of her lab results at our next visit.  She is aware that if anything is critical/ life threatening with the results, we will be contacting her via MyChart prior to the office visit to discuss management.    Chief Complaint:   OBESITY [name] (MR# [id number]) is a pleasant  58 y.o. female who presents for evaluation and treatment of obesity and related comorbidities. Current BMI is Body mass index is 45.16 kg/m. [name] has been struggling with her weight for many years and has been unsuccessful in either losing weight, maintaining weight loss, or reaching her healthy weight goal.  [name] is currently in the action  stage of change and ready to dedicate time achieving and maintaining a healthier weight. [name] is interested in becoming our patient and working on intensive lifestyle modifications including (but not limited to) diet and exercise for weight loss.  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 Patient is married to [place]  and has 3 children. She lives with with her husband.  She desires to be 150-160 lbs within a year.   Wants to lose weight to be healthy; reasons for weight gain: none stated.   Exercise regiment: walking & strength training 60 minutes, 3 days per week.   Has never been on a formal diet plan.   Eats outside of home 3-4 times a week.   Craves: "snacky foods and sweets" in the mid-morning. Snacks on cookies, candy, cakes, and chips.  Drinks some caloric beverages: (1-2 regular sodas per day, coffee with creamer and sugar, sweet tea with sugar, juice, and fruit smoothies)   Occasionally skips dinner   Worst food habit: "snacks, sodas, and sweets"  Subjective:   This is the patient's first visit at [facility].  The patient's NEW PATIENT PACKET that they filled out prior to today's office visit was reviewed at length and information from that paperwork was included within the following office visit note.    Included in the packet: current and past health history, medications, allergies, ROS, gynecologic history (women only), surgical history, family history, social history, weight history, weight loss surgery history (for those that have had weight loss surgery), nutritional evaluation, mood and food questionnaire along with a depression screening (PHQ9) on all patients, an Epworth questionnaire, sleep habits questionnaire, patient life and health improvement goals questionnaire. These will all be scanned into the patient's chart under the "media" tab.   Review of Systems: Please refer to new patient packet  scanned into media. Pertinent positives were addressed with patient today.  Reviewed by clinician on day of visit: allergies, medications, problem list, medical history, surgical history, family history, social history, and previous encounter notes.  During the visit, I independently reviewed the patient's EKG, bioimpedance scale results, and indirect calorimeter results. I used this information to tailor a meal plan for the patient that will help [name] to lose weight and will improve her obesity-related conditions going forward.  I performed a medically necessary appropriate examination and/or evaluation. I discussed the assessment and treatment plan with the patient. The patient was provided an opportunity to ask questions and all were answered. The patient agreed with the plan and demonstrated an  understanding of the instructions. Labs were ordered today (unless patient declined them) and will be reviewed with the patient at our next visit unless more critical results need to be addressed immediately. Clinical information was updated and documented in the EMR.   Objective:   PHYSICAL EXAM: Blood pressure (!) 101/58, pulse 65, temperature 98.4 F (36.9 C), height 5\' 1"  (1.549 m), weight 239 lb (108.4 kg), SpO2 99%. Body mass index is 45.16 kg/m.  General: Well Developed, well nourished, and in no acute distress.  HEENT: Normocephalic, atraumatic; EOMI, sclerae are anicteric. Skin: Warm and dry, good turgor Chest:  Normal excursion, shape, no gross ABN Respiratory: No conversational dyspnea; speaking in full sentences NeuroM-Sk:  Normal gross ROM * 4 extremities  Psych: A and O *3, insight adequate, mood- full   Anthropometric Measurements Height: 5\' 1"  (1.549 m) Weight: 239 lb (108.4 kg) BMI (Calculated): 45.18 Weight at Last Visit: N/A Weight Lost Since Last Visit: N/A Weight Gained Since Last Visit: N/A Starting Weight: 239 lb Waist Measurement : 55 inches   Body  Composition  Body Fat %: 50.9 % Fat Mass (lbs): 121.8 lbs Muscle Mass (lbs): 111.4 lbs Total Body Water (lbs): 85.6 lbs Visceral Fat Rating : 18   Other Clinical Data Fasting: Yes Labs: Yes Today's Visit #: #1 Starting Date: 05/11/23 Comments: First Visit   DIAGNOSTIC DATA REVIEWED:  BMET    Component Value Date/Time   NA 140 02/12/2023 0857   K 3.5 02/12/2023 0857   CL 103 02/12/2023 0857   CO2 31 02/12/2023 0857   GLUCOSE 88 02/12/2023 0857   BUN 19 02/12/2023 0857   CREATININE 1.10 02/12/2023 0857   CREATININE 1.10 (H) 08/02/2020 1601   CALCIUM 9.6 02/12/2023 0857   GFRNONAA >60 11/10/2022 0026   GFRNONAA 55 (L) 11/29/2019 0905   GFRAA 64 11/29/2019 0905   Lab Results  Component Value Date   HGBA1C 5.9 02/12/2023   HGBA1C  11/02/2006    6.0 (NOTE)   The ADA recommends the following therapeutic goals for glycemic   control related to Hgb A1C measurement:   Goal of Therapy:   < 7.0% Hgb A1C   Action Suggested:  > 8.0% Hgb A1C   Ref:  Diabetes Care, 22, Suppl. 1, 1999   Lab Results  Component Value Date   INSULIN CANCELED 07/12/2019   Lab Results  Component Value Date   TSH 2.27 05/11/2019   CBC    Component Value Date/Time   WBC 6.6 11/09/2022 0218   RBC 4.28 11/09/2022 0218   HGB 11.2 (L) 11/09/2022 0218   HGB 12.9 07/12/2019 1633   HCT 34.9 (L) 11/09/2022 0218   HCT 39.6 07/12/2019 1633   PLT 217 11/09/2022 0218   PLT 268 07/12/2019 1633   MCV 81.5 11/09/2022 0218   MCV 81 07/12/2019 1633   MCH 26.2 11/09/2022 0218   MCHC 32.1 11/09/2022 0218   RDW 15.9 (H) 11/09/2022 0218   RDW 14.3 07/12/2019 1633   Iron Studies    Component Value Date/Time   IRON 74 05/15/2019 0750   FERRITIN 137.0 05/15/2019 0750   IRONPCTSAT 23.2 05/15/2019 0750   Lipid Panel     Component Value Date/Time   CHOL 205 (H) 11/06/2022 0330   TRIG 51 11/06/2022 0330   HDL 80 11/06/2022 0330   CHOLHDL 2.6 11/06/2022 0330   VLDL 10 11/06/2022 0330   LDLCALC 115 (H)  11/06/2022 0330   Hepatic Function Panel     Component Value  Date/Time   PROT 7.5 02/12/2023 0857   ALBUMIN 4.0 02/12/2023 0857   AST 32 02/12/2023 0857   ALT 30 02/12/2023 0857   ALKPHOS 356 (H) 02/12/2023 0857   BILITOT 0.4 02/12/2023 0857   BILIDIR 0.0 02/12/2023 0857   IBILI 0.2 08/02/2020 1601      Component Value Date/Time   TSH 2.27 05/11/2019 1634   Nutritional Lab Results  Component Value Date   VD25OH 28.19 (L) 01/31/2021   VD25OH 24 (L) 08/02/2020   VD25OH 26 (L) 11/29/2019    Attestation Statements:   I, Special Puri, acting as a Stage manager for Thomasene Lot, DO., have compiled all relevant documentation for today's office visit on behalf of Thomasene Lot, DO, while in the presence of [employer], DO.  Reviewed by clinician on day of visit: allergies, medications, problem list, medical history, surgical history, family history, social history, and previous encounter notes pertinent to patient's obesity diagnosis. I have spent 56 minutes in the care of the patient today including: preparing to see patient (e.g. review and interpretation of tests, old notes ), obtaining and/or reviewing separately obtained history, performing a medically appropriate examination or evaluation, counseling and educating the patient, ordering medications, test or procedures, documenting clinical information in the electronic or other health care record, and independently interpreting results and communicating results to the patient, family, or caregiver   I have reviewed the above documentation for accuracy and completeness, and I agree with the above. Carlye Grippe, D.O.  The 21st Century Cures Act was signed into law in 2016 which includes the topic of electronic health records.  This provides immediate access to information in MyChart.  This includes consultation notes, operative notes, office notes, lab results and pathology reports.  If you have any questions about what  you read please let us know at your next visit so we can discuss your concerns and take corrective action if need be.  We are right here with you.

## 2023-05-12 LAB — COMPREHENSIVE METABOLIC PANEL
ALT: 20 [IU]/L (ref 0–32)
AST: 23 [IU]/L (ref 0–40)
Albumin: 4.3 g/dL (ref 3.8–4.9)
Alkaline Phosphatase: 299 [IU]/L — ABNORMAL HIGH (ref 44–121)
BUN/Creatinine Ratio: 19 (ref 9–23)
BUN: 20 mg/dL (ref 6–24)
Bilirubin Total: 0.3 mg/dL (ref 0.0–1.2)
CO2: 27 mmol/L (ref 20–29)
Calcium: 9.8 mg/dL (ref 8.7–10.2)
Chloride: 101 mmol/L (ref 96–106)
Creatinine, Ser: 1.08 mg/dL — ABNORMAL HIGH (ref 0.57–1.00)
Globulin, Total: 3 g/dL (ref 1.5–4.5)
Glucose: 90 mg/dL (ref 70–99)
Potassium: 4 mmol/L (ref 3.5–5.2)
Sodium: 142 mmol/L (ref 134–144)
Total Protein: 7.3 g/dL (ref 6.0–8.5)
eGFR: 60 mL/min/{1.73_m2} (ref >59)

## 2023-05-12 LAB — CBC WITH DIFFERENTIAL/PLATELET
Basophils Absolute: 0 10*3/uL (ref 0.0–0.2)
Basos: 1 %
EOS (ABSOLUTE): 0.2 10*3/uL (ref 0.0–0.4)
Eos: 4 %
Hematocrit: 41.2 % (ref 34.0–46.6)
Hemoglobin: 13.4 g/dL (ref 11.1–15.9)
Immature Grans (Abs): 0 10*3/uL (ref 0.0–0.1)
Immature Granulocytes: 0 %
Lymphocytes Absolute: 2.3 10*3/uL (ref 0.7–3.1)
Lymphs: 41 %
MCH: 26.6 pg (ref 26.6–33.0)
MCHC: 32.5 g/dL (ref 31.5–35.7)
MCV: 82 fL (ref 79–97)
Monocytes Absolute: 0.3 10*3/uL (ref 0.1–0.9)
Monocytes: 6 %
Neutrophils Absolute: 2.7 10*3/uL (ref 1.4–7.0)
Neutrophils: 48 %
Platelets: 258 10*3/uL (ref 150–450)
RBC: 5.04 x10E6/uL (ref 3.77–5.28)
RDW: 14.4 % (ref 11.7–15.4)
WBC: 5.5 10*3/uL (ref 3.4–10.8)

## 2023-05-12 LAB — T4, FREE: Free T4: 0.91 ng/dL (ref 0.82–1.77)

## 2023-05-12 LAB — INSULIN, RANDOM: INSULIN: 8.9 u[IU]/mL (ref 2.6–24.9)

## 2023-05-12 LAB — HEMOGLOBIN A1C
Est. average glucose Bld gHb Est-mCnc: 128 mg/dL
Hgb A1c MFr Bld: 6.1 % — ABNORMAL HIGH (ref 4.8–5.6)

## 2023-05-12 LAB — VITAMIN D 25 HYDROXY (VIT D DEFICIENCY, FRACTURES): Vit D, 25-Hydroxy: 17.1 ng/mL — ABNORMAL LOW (ref 30.0–100.0)

## 2023-05-12 LAB — LIPID PANEL
Chol/HDL Ratio: 2.1 {ratio} (ref 0.0–4.4)
Cholesterol, Total: 203 mg/dL — ABNORMAL HIGH (ref 100–199)
HDL: 99 mg/dL (ref >39)
LDL Chol Calc (NIH): 92 mg/dL (ref 0–99)
Triglycerides: 68 mg/dL (ref 0–149)
VLDL Cholesterol Cal: 12 mg/dL (ref 5–40)

## 2023-05-12 LAB — VITAMIN B12: Vitamin B-12: 660 pg/mL (ref 232–1245)

## 2023-05-12 LAB — FOLATE: Folate: 7.6 ng/mL (ref >3.0)

## 2023-05-12 LAB — T3: T3, Total: 126 ng/dL (ref 71–180)

## 2023-05-12 LAB — TSH: TSH: 2.43 u[IU]/mL (ref 0.450–4.500)

## 2023-05-17 ENCOUNTER — Encounter: Payer: Self-pay | Admitting: Family Medicine

## 2023-05-19 ENCOUNTER — Encounter (INDEPENDENT_AMBULATORY_CARE_PROVIDER_SITE_OTHER): Payer: BC Managed Care – PPO | Admitting: Family Medicine

## 2023-05-19 ENCOUNTER — Other Ambulatory Visit (HOSPITAL_COMMUNITY): Payer: Self-pay

## 2023-05-19 DIAGNOSIS — I10 Essential (primary) hypertension: Secondary | ICD-10-CM

## 2023-05-19 MED ORDER — AMLODIPINE BESYLATE 5 MG PO TABS
5.0000 mg | ORAL_TABLET | Freq: Every day | ORAL | 0 refills | Status: DC
Start: 2023-05-19 — End: 2023-08-19
  Filled 2023-05-19: qty 90, 90d supply, fill #0

## 2023-05-19 NOTE — Telephone Encounter (Signed)
Please see the MyChart message reply(ies) for my assessment and plan.  The patient gave consent for this Medical Advice Message and is aware that it may result in a bill to their insurance company as well as the possibility that this may result in a co-payment or deductible. They are an established patient, but are not seeking medical advice exclusively about a problem treated during an in person or video visit in the last 7 days. I did not recommend an in person or video visit within 7 days of my reply.  Essential hypertension, benign -     amLODIPine Besylate; Take 1 tablet (5 mg total) by mouth daily.  Dispense: 90 tablet; Refill: 0  I spent a total of 8 minutes cumulative time within 7 days through [employer] Maccoy Haubner [place], MD

## 2023-05-25 ENCOUNTER — Encounter (INDEPENDENT_AMBULATORY_CARE_PROVIDER_SITE_OTHER): Payer: Self-pay | Admitting: Family Medicine

## 2023-05-25 ENCOUNTER — Ambulatory Visit (INDEPENDENT_AMBULATORY_CARE_PROVIDER_SITE_OTHER): Payer: BC Managed Care – PPO | Admitting: Family Medicine

## 2023-05-25 ENCOUNTER — Other Ambulatory Visit (HOSPITAL_COMMUNITY): Payer: Self-pay

## 2023-05-25 VITALS — BP 119/77 | HR 68 | Temp 97.7°F | Ht 61.0 in | Wt 232.0 lb

## 2023-05-25 DIAGNOSIS — E669 Obesity, unspecified: Secondary | ICD-10-CM

## 2023-05-25 DIAGNOSIS — E559 Vitamin D deficiency, unspecified: Secondary | ICD-10-CM | POA: Diagnosis not present

## 2023-05-25 DIAGNOSIS — E1169 Type 2 diabetes mellitus with other specified complication: Secondary | ICD-10-CM

## 2023-05-25 DIAGNOSIS — I152 Hypertension secondary to endocrine disorders: Secondary | ICD-10-CM | POA: Diagnosis not present

## 2023-05-25 DIAGNOSIS — Z6841 Body Mass Index (BMI) 40.0 and over, adult: Secondary | ICD-10-CM

## 2023-05-25 DIAGNOSIS — E782 Mixed hyperlipidemia: Secondary | ICD-10-CM

## 2023-05-25 DIAGNOSIS — E1159 Type 2 diabetes mellitus with other circulatory complications: Secondary | ICD-10-CM

## 2023-05-25 MED ORDER — VITAMIN D (ERGOCALCIFEROL) 1.25 MG (50000 UNIT) PO CAPS
50000.0000 [IU] | ORAL_CAPSULE | ORAL | 0 refills | Status: DC
  Filled 2023-05-25: qty 4, 28d supply, fill #0

## 2023-05-25 NOTE — Progress Notes (Signed)
[name], D.O.  ABFM, ABOM Clinical Bariatric Medicine Physician  Office located at: [address] [place], [place]  [address]     Assessment and Plan:   FOR THE DISEASE OF OBESITY: Morbid obesity (HCC) BMI 40.0-44.9, adult (HCC) - Current BMI 43.86 Assessment & Plan: Since last office visit on 05/11/23 patient's muscle mass has decreased by 1.4 lb. Fat mass has decreased by 5.2lb. Total body water has decreased by 1 lb.  Counseling done on how various foods will affect these numbers and how to maximize success  Total lbs lost to date: 7 lbs Total weight loss percentage to date: -2.93%   Recommended Dietary Goals [name] is currently in the action stage of change. As such, her goal is to continue weight management plan.  She has agreed to: continue current plan  Pt reports an increase in fatigue for the last week or so. If she continues to feel low energy by her next visit, we will consider having her start journaling to see what she is eating and if it is enough calories/protein per day.   Behavioral Intervention We discussed the following Behavioral Modification Strategies today: increasing lean protein intake to established goals, decreasing simple carbohydrates , avoiding skipping meals, increasing water intake , work on meal planning and preparation, keeping healthy foods at home, work on managing stress, creating time for self-care and relaxation, and continue to work on implementation of reduced calorie nutritional plan  Additional resources provided today: None  Evidence-based interventions for health behavior change were utilized today including the discussion of self monitoring techniques, problem-solving barriers and SMART goal setting techniques.   Regarding patient's less desirable eating habits and patterns, we employed the technique of small changes.   Pt will specifically work on: continue working on increasing protein intake and eating on plan for  next visit.   Recommended Physical Activity Goals [name] has been advised to work up to 150 minutes of moderate intensity aerobic activity a week and strengthening exercises 2-3 times per week for cardiovascular health, weight loss maintenance and preservation of muscle mass.   She has agreed to :  Think about enjoyable ways to increase daily physical activity and overcoming barriers to exercise and Increase physical activity in their day and reduce sedentary time (increase NEAT).   Pharmacotherapy We discussed various medication options to help [name] with her weight loss efforts and we both agreed to: continue with nutritional and behavioral strategies. Given her hx of pancreatitis I do not recommend she start GLP-1s. Also has hx of GI upset/loose stools while on Metformin in the past.    FOR ASSOCIATED CONDITIONS ADDRESSED TODAY: Type 2 diabetes mellitus with obesity [facility]) Assessment & Plan: Lab Results  Component Value Date   HGBA1C 6.1 (H) 05/11/2023   HGBA1C 5.9 02/12/2023   HGBA1C 5.6 08/28/2022   INSULIN 8.9 05/11/2023   INSULIN CANCELED 07/12/2019    A1c increased to 6.1 from 5.9. Insulin is at goal at 8.9 as of 05/11/23. No current meds currently. Diet/exercise approach. Hunger/cravings are well controlled. She was previously on Ozempic but has not resumed since her cholecystectomy in 10/2022. Also has hx of pancreatitis at that time. Was on Metformin in the past as well but discontinued due to GI upset (loose stool).   Given her hx of pancreatitis I do not recommend she start GLP-1s. Counseled pt on the importance of eating enough proteins on a daily basis, including stabilizing her blood sugars and improving control of hunger/cravings.  Continue with current regimen. Will continue to monitor condition.    Hypertension associated with type 2 diabetes mellitus [facility]) Assessment & Plan: BP Readings from Last 3 Encounters:  05/25/23 119/77  05/11/23 (!) 101/58  04/19/23 114/73    BP is at goal today. Since her last visit she messaged her PCP, who is weaning her off Carvedilol and started her on Amlodipine. Tolerating Amlodipine 5 mg well, no SE. She is on Lozol 1.25 mg once daily.   Continue with Amlodipine as instructed by her PCP. Encouraged pt to follow a heart healthy diet via her prudent nutritional meal plan. Will continue to monitor condition as it relates to her weight loss journey.    Mixed diabetic hyperlipidemia associated with type 2 diabetes mellitus (HCC) Assessment & Plan: Lab Results  Component Value Date   CHOL 203 (H) 05/11/2023   HDL 99 05/11/2023   LDLCALC 92 05/11/2023   TRIG 68 05/11/2023   CHOLHDL 2.1 05/11/2023   HDL has improved, at 99. She is currently on Zetia 10 mg once daily with good compliance and tolerance. Was previously on pravastatin. Pt states she has been trying to stay more active than she was years ago, tends to opt to take the stairs when she is able.   Advised pt to decrease simple carbs and avoid fatty meats per her meal plan in an effort to improve her cholesterol components. Encouraged pt to find ways to increase exercise and reduce NEAT. I recommend she continue with her current medication regimen. No med changes made today.    Vitamin D deficiency Assessment & Plan: Lab Results  Component Value Date   VD25OH 17.1 (L) 05/11/2023   VD25OH 28.19 (L) 01/31/2021   VD25OH 24 (L) 08/02/2020   Last vitamin D has been below goal for over 3 years. Pt not currently on any supplementation, but has been on supplementation in the past.   Ideal levels of vitamin D of 50-70 was reviewed with pt. Discussed the benefits of her vitamin D being at goal including improvements with moods, energy and weight loss. I recommend she start vitamin D supplementation at this time, pt is agreeable to this. Will recheck vitamin D in 3-4 months.   Orders: - Start ERGO 50K units once weekly    FOLLOW UP:   Return in about 2 weeks (around  06/08/2023). She was informed of the importance of frequent follow up visits to maximize her success with intensive lifestyle modifications for her multiple health conditions.  Subjective:   Chief complaint: Obesity Trianna is here to discuss her progress with her obesity treatment plan. She is on the Category 1 Plan and states she is following her eating plan approximately 98% of the time. She states she is not exercising.  Interval History:  BONNEY BERRES is here today for her first follow-up office visit since starting the program with [place]. Since last office visit she is down 7 lbs. Endorses cravings for sweets after lunch and before bed, states this is when she typically ate her sweets. Has Chobani sugar free greek yogurt for snacks, at times has 2 a day. Has also been eating more fruits (nectarines, apples, berries). Skipped lunch today due to rushing to her visit from work. For lunch tends to eat salad with 4 ounces of protein or a [place] sandwich or  vegetables and chicken. Has been weighing her proteins. She has been taking bariatric multivitamins. Of note, she reports having low energy in the last week or so.  All blood work/ lab tests that were recently ordered by myself or an outside provider were reviewed with patient today per their request. Extended time was spent counseling her on all new disease processes that were discovered or preexisting ones that are affected by BMI.  she understands that many of these abnormalities will need to monitored regularly along with the current treatment plan of prudent dietary changes, in which we are making each and every office visit, to improve these health parameters.  Pharmacotherapy for weight loss: She is and is not currently taking medications  for medical weight loss.  Denies side effects.    Review of Systems:  Pertinent positives were addressed with patient today.  Reviewed by clinician on day of visit: allergies, medications, problem list,  medical history, surgical history, family history, social history, and previous encounter notes.   Weight Summary and Biometrics   Weight Lost Since Last Visit: 7  Weight Gained Since Last Visit: 0   Vitals Temp: 97.7 F (36.5 C) BP: 119/77 Pulse Rate: 68 SpO2: 100 %   Anthropometric Measurements Height: 5\' 1"  (1.549 m) Weight: 232 lb (105.2 kg) BMI (Calculated): 43.86 Weight at Last Visit: 239 lb Weight Lost Since Last Visit: 7 Weight Gained Since Last Visit: 0 Starting Weight: 239 lb Total Weight Loss (lbs): 7 lb (3.175 kg) Peak Weight: 302 lb   Body Composition  Body Fat %: 50.2 % Fat Mass (lbs): 116.6 lbs Muscle Mass (lbs): 110 lbs Total Body Water (lbs): 84.6 lbs Visceral Fat Rating : 17   Other Clinical Data Fasting: no Labs: no Today's Visit #: 2 Starting Date: 05/11/23     Objective:   PHYSICAL EXAM:  Blood pressure 119/77, pulse 68, temperature 97.7 F (36.5 C), height 5\' 1"  (1.549 m), weight 232 lb (105.2 kg), SpO2 100%. Body mass index is 43.84 kg/m.  General: she is overweight, cooperative and in no acute distress.   HEENT: EOMI, sclerae are anicteric. Lungs: Normal breathing effort, no conversational dyspnea. M-Sk:  Normal gross ROM * 4 extremities  PSYCH: Has normal mood, affect and thought process. Neurologic: No gross sensory or motor deficits. Well developed, A and O * 3  DIAGNOSTIC DATA REVIEWED:  BMET    Component Value Date/Time   NA 142 05/11/2023 0932   K 4.0 05/11/2023 0932   CL 101 05/11/2023 0932   CO2 27 05/11/2023 0932   GLUCOSE 90 05/11/2023 0932   GLUCOSE 88 02/12/2023 0857   BUN 20 05/11/2023 0932   CREATININE 1.08 (H) 05/11/2023 0932   CREATININE 1.10 (H) 08/02/2020 1601   CALCIUM 9.8 05/11/2023 0932   GFRNONAA >60 11/10/2022 0026   GFRNONAA 55 (L) 11/29/2019 0905   GFRAA 64 11/29/2019 0905   Lab Results  Component Value Date   HGBA1C 6.1 (H) 05/11/2023   HGBA1C  11/02/2006    6.0 (NOTE)   The ADA  recommends the following therapeutic goals for glycemic   control related to Hgb A1C measurement:   Goal of Therapy:   < 7.0% Hgb A1C   Action Suggested:  > 8.0% Hgb A1C   Ref:  Diabetes Care, 22, Suppl. 1, 1999   Lab Results  Component Value Date   INSULIN 8.9 05/11/2023   INSULIN CANCELED 07/12/2019   Lab Results  Component Value Date   TSH 2.430 05/11/2023   CBC    Component Value Date/Time   WBC 5.5 05/11/2023 0932   WBC 6.6 11/09/2022 0218   RBC 5.04 05/11/2023 0932  RBC 4.28 11/09/2022 0218   HGB 13.4 05/11/2023 0932   HCT 41.2 05/11/2023 0932   PLT 258 05/11/2023 0932   MCV 82 05/11/2023 0932   MCH 26.6 05/11/2023 0932   MCH 26.2 11/09/2022 0218   MCHC 32.5 05/11/2023 0932   MCHC 32.1 11/09/2022 0218   RDW 14.4 05/11/2023 0932   Iron Studies    Component Value Date/Time   IRON 74 05/15/2019 0750   FERRITIN 137.0 05/15/2019 0750   IRONPCTSAT 23.2 05/15/2019 0750   Lipid Panel     Component Value Date/Time   CHOL 203 (H) 05/11/2023 0932   TRIG 68 05/11/2023 0932   HDL 99 05/11/2023 0932   CHOLHDL 2.1 05/11/2023 0932   CHOLHDL 2.6 11/06/2022 0330   VLDL 10 11/06/2022 0330   LDLCALC 92 05/11/2023 0932   Hepatic Function Panel     Component Value Date/Time   PROT 7.3 05/11/2023 0932   ALBUMIN 4.3 05/11/2023 0932   AST 23 05/11/2023 0932   ALT 20 05/11/2023 0932   ALKPHOS 299 (H) 05/11/2023 0932   BILITOT 0.3 05/11/2023 0932   BILIDIR 0.0 02/12/2023 0857   IBILI 0.2 08/02/2020 1601      Component Value Date/Time   TSH 2.430 05/11/2023 0932   Nutritional Lab Results  Component Value Date   VD25OH 17.1 (L) 05/11/2023   VD25OH 28.19 (L) 01/31/2021   VD25OH 24 (L) 08/02/2020    Attestations:   Reviewed by clinician on day of visit: allergies, medications, problem list, medical history, surgical history, family history, social history, and previous encounter notes pertinent to patient's obesity diagnosis.   I have spent 50 minutes in the care of  the patient today including: preparing to see patient (e.g. review and interpretation of tests, old notes ), obtaining and/or reviewing separately obtained history, performing a medically appropriate examination or evaluation, counseling and educating the patient, ordering medications, test or procedures, documenting clinical information in the electronic or other health care record, and independently interpreting results and communicating results to the patient, family, or caregiver   I, Isabelle Course, acting as a medical scribe for Thomasene Lot, DO., have compiled all relevant documentation for today's office visit on behalf of Thomasene Lot, DO, while in the presence of [employer], DO.  I have reviewed the above documentation for accuracy and completeness, and I agree with the above. [name], D.O.  The 21st Century Cures Act was signed into law in 2016 which includes the topic of electronic health records.  This provides immediate access to information in MyChart.  This includes consultation notes, operative notes, office notes, lab results and pathology reports.  If you have any questions about what you read please let us know at your next visit so we can discuss your concerns and take corrective action if need be.  We are right here with you.

## 2023-06-08 ENCOUNTER — Ambulatory Visit (INDEPENDENT_AMBULATORY_CARE_PROVIDER_SITE_OTHER): Payer: BC Managed Care – PPO | Admitting: Family Medicine

## 2023-06-08 ENCOUNTER — Encounter: Payer: Self-pay | Admitting: Family Medicine

## 2023-06-17 DIAGNOSIS — E2839 Other primary ovarian failure: Secondary | ICD-10-CM | POA: Diagnosis not present

## 2023-06-22 ENCOUNTER — Other Ambulatory Visit (INDEPENDENT_AMBULATORY_CARE_PROVIDER_SITE_OTHER): Payer: Self-pay | Admitting: Family Medicine

## 2023-06-22 ENCOUNTER — Other Ambulatory Visit (HOSPITAL_COMMUNITY): Payer: Self-pay

## 2023-06-23 ENCOUNTER — Telehealth (INDEPENDENT_AMBULATORY_CARE_PROVIDER_SITE_OTHER): Payer: Self-pay | Admitting: Family Medicine

## 2023-06-23 ENCOUNTER — Ambulatory Visit (INDEPENDENT_AMBULATORY_CARE_PROVIDER_SITE_OTHER): Payer: BC Managed Care – PPO | Admitting: Family Medicine

## 2023-06-23 ENCOUNTER — Other Ambulatory Visit (HOSPITAL_COMMUNITY): Payer: Self-pay

## 2023-06-23 NOTE — Telephone Encounter (Signed)
 Patient needs a refill of her Vitamin D. Pt's appt today was canceled due to Dr. Synthia Innocent illness. Pt would like a refill of the Vitamin D. Her next appt is on 07/07/2023.

## 2023-06-28 ENCOUNTER — Other Ambulatory Visit (HOSPITAL_COMMUNITY): Payer: Self-pay

## 2023-06-28 MED ORDER — VITAMIN D (ERGOCALCIFEROL) 1.25 MG (50000 UNIT) PO CAPS
50000.0000 [IU] | ORAL_CAPSULE | ORAL | 0 refills | Status: DC
  Filled 2023-06-28: qty 4, 28d supply, fill #0

## 2023-07-07 ENCOUNTER — Other Ambulatory Visit (HOSPITAL_COMMUNITY): Payer: Self-pay

## 2023-07-07 ENCOUNTER — Encounter (INDEPENDENT_AMBULATORY_CARE_PROVIDER_SITE_OTHER): Payer: Self-pay | Admitting: Family Medicine

## 2023-07-07 ENCOUNTER — Ambulatory Visit (INDEPENDENT_AMBULATORY_CARE_PROVIDER_SITE_OTHER): Payer: BC Managed Care – PPO | Admitting: Family Medicine

## 2023-07-07 VITALS — BP 109/74 | HR 71 | Temp 98.3°F | Ht 61.0 in | Wt 234.0 lb

## 2023-07-07 DIAGNOSIS — Z6841 Body Mass Index (BMI) 40.0 and over, adult: Secondary | ICD-10-CM | POA: Diagnosis not present

## 2023-07-07 DIAGNOSIS — E559 Vitamin D deficiency, unspecified: Secondary | ICD-10-CM | POA: Diagnosis not present

## 2023-07-07 DIAGNOSIS — E1169 Type 2 diabetes mellitus with other specified complication: Secondary | ICD-10-CM

## 2023-07-07 MED ORDER — VITAMIN D (ERGOCALCIFEROL) 1.25 MG (50000 UNIT) PO CAPS
50000.0000 [IU] | ORAL_CAPSULE | ORAL | 0 refills | Status: DC
  Filled 2023-07-07 – 2023-07-31 (×2): qty 4, 28d supply, fill #0

## 2023-07-07 NOTE — Progress Notes (Incomplete)
 [name], D.O.  ABFM, ABOM Specializing in Clinical Bariatric Medicine  Office located at: [address] [place], [place]  [address]   Assessment and Plan:  No orders of the defined types were placed in this encounter.   Medications Discontinued During This Encounter  Medication Reason   polyethylene glycol (MIRALAX / GLYCOLAX) 17 g packet      No orders of the defined types were placed in this encounter.     FOR THE DISEASE OF OBESITY:  There are no diagnoses linked to this encounter.   Since last office visit on *** patient's  Muscle mass has {DID:29233} by ***lb. Fat mass has {DID:29233} by ***lb. Total body water has {DID:29233} by ***lb.  Counseling done on how various foods will affect these numbers and how to maximize success  Total lbs lost to date: *** Total weight loss percentage to date: ***    Recommended Dietary Goals [name] is currently in the action stage of change. As such, her goal is to continue weight management plan.  She has agreed to: continue current plan   Behavioral Intervention We discussed the following today: increasing lean protein intake to established goals, decreasing simple carbohydrates , keeping healthy foods at home, and continue to practice mindfulness when eating  Additional resources provided today: Handout on non-starchy vegetables, Handout on CAT 1 meal plan , Handout on CAT 1-2 breakfast options, and Handout on CAT 1-2 lunch options  Evidence-based interventions for health behavior change were utilized today including the discussion of self monitoring techniques, problem-solving barriers and SMART goal setting techniques.   Regarding patient's less desirable eating habits and patterns, we employed the technique of small changes.   Pt will specifically work on: Focusing on eating protein for next visit.    Recommended Physical Activity Goals [name] has been advised to work up to 150 minutes of moderate intensity  aerobic activity a week and strengthening exercises 2-3 times per week for cardiovascular health, weight loss maintenance and preservation of muscle mass.   She has agreed to :  {EMEXERCISE:28847::"Think about enjoyable ways to increase daily physical activity and overcoming barriers to exercise","Increase physical activity in their day and reduce sedentary time (increase NEAT)."}   Pharmacotherapy We both agreed to : {EMagreedrx:29170}   FOR ASSOCIATED CONDITIONS ADDRESSED TODAY:  Type 2 diabetes mellitus with obesity Assessment & Plan:    Pt has hx of pancreatitis so informed pt its not a good idea to start GLP-1 Discussed potentially restarting Metformin medication in the future Had GI upset in the past Pt wants to hold off on medication d/t hx of Cholecystectomy, not currently eating on plan,  and wants to avoid additional GI upset Agreed to work on adhering more closely to MP   Vitamin D deficiency Assessment & Plan:    ERGO 50000 units once weekly Started during LOV   Follow up:   No follow-ups on file.*** She was informed of the importance of frequent follow up visits to maximize her success with intensive lifestyle modifications for her multiple health conditions.  Subjective:   Chief complaint: Obesity [name] is here to discuss her progress with her obesity treatment plan. She is on the the Category 1 Plan and states she is following her eating plan approximately 80% of the time. She states she is going to the gym or walking 30-60 minutes 5-7 days per week.  Interval History:  [name] is here for a follow up office visit. Since last OV  on ,  ***    Struggling with cravings Giving into sweets Having no issues with eating adequate proteins Mainly cravings before or after lunch and before bedtime   Pharmacotherapy for weight loss: She is currently taking no anti-obesity medication.   Review of Systems:  Pertinent positives were addressed with  patient today.  Reviewed by clinician on day of visit: allergies, medications, problem list, medical history, surgical history, family history, social history, and previous encounter notes.  Weight Summary and Biometrics   Weight Lost Since Last Visit: 0  Weight Gained Since Last Visit: 2lb   Vitals Temp: 98.3 F (36.8 C) BP: 109/74 Pulse Rate: 71 SpO2: 96 %   Anthropometric Measurements Height: 5\' 1"  (1.549 m) Weight: 234 lb (106.1 kg) BMI (Calculated): 44.24 Weight at Last Visit: 232lb Weight Lost Since Last Visit: 0 Weight Gained Since Last Visit: 2lb Starting Weight: 239lb Total Weight Loss (lbs): 5 lb (2.268 kg) Peak Weight: 302lb   Body Composition  Body Fat %: 49.3 % Fat Mass (lbs): 115.4 lbs Muscle Mass (lbs): 112.6 lbs Total Body Water (lbs): 86 lbs Visceral Fat Rating : 17   Other Clinical Data Fasting: no Labs: no Today's Visit #: 3 Starting Date: 05/11/23    Objective:   PHYSICAL EXAM: Blood pressure 109/74, pulse 71, temperature 98.3 F (36.8 C), height 5\' 1"  (1.549 m), weight 234 lb (106.1 kg), SpO2 96%. Body mass index is 44.21 kg/m.  General: she is overweight, cooperative and in no acute distress. PSYCH: Has normal mood, affect and thought process.   HEENT: EOMI, sclerae are anicteric. Lungs: Normal breathing effort, no conversational dyspnea. Extremities: Moves * 4 Neurologic: A and O * 3, good insight  DIAGNOSTIC DATA REVIEWED: BMET    Component Value Date/Time   NA 142 05/11/2023 0932   K 4.0 05/11/2023 0932   CL 101 05/11/2023 0932   CO2 27 05/11/2023 0932   GLUCOSE 90 05/11/2023 0932   GLUCOSE 88 02/12/2023 0857   BUN 20 05/11/2023 0932   CREATININE 1.08 (H) 05/11/2023 0932   CREATININE 1.10 (H) 08/02/2020 1601   CALCIUM 9.8 05/11/2023 0932   GFRNONAA >60 11/10/2022 0026   GFRNONAA 55 (L) 11/29/2019 0905   GFRAA 64 11/29/2019 0905   Lab Results  Component Value Date   HGBA1C 6.1 (H) 05/11/2023   HGBA1C   11/02/2006    6.0 (NOTE)   The ADA recommends the following therapeutic goals for glycemic   control related to Hgb A1C measurement:   Goal of Therapy:   < 7.0% Hgb A1C   Action Suggested:  > 8.0% Hgb A1C   Ref:  Diabetes Care, 22, Suppl. 1, 1999   Lab Results  Component Value Date   INSULIN 8.9 05/11/2023   INSULIN CANCELED 07/12/2019   Lab Results  Component Value Date   TSH 2.430 05/11/2023   CBC    Component Value Date/Time   WBC 5.5 05/11/2023 0932   WBC 6.6 11/09/2022 0218   RBC 5.04 05/11/2023 0932   RBC 4.28 11/09/2022 0218   HGB 13.4 05/11/2023 0932   HCT 41.2 05/11/2023 0932   PLT 258 05/11/2023 0932   MCV 82 05/11/2023 0932   MCH 26.6 05/11/2023 0932   MCH 26.2 11/09/2022 0218   MCHC 32.5 05/11/2023 0932   MCHC 32.1 11/09/2022 0218   RDW 14.4 05/11/2023 0932   Iron Studies    Component Value Date/Time   IRON 74 05/15/2019 0750   FERRITIN 137.0 05/15/2019 0750  IRONPCTSAT 23.2 05/15/2019 0750   Lipid Panel     Component Value Date/Time   CHOL 203 (H) 05/11/2023 0932   TRIG 68 05/11/2023 0932   HDL 99 05/11/2023 0932   CHOLHDL 2.1 05/11/2023 0932   CHOLHDL 2.6 11/06/2022 0330   VLDL 10 11/06/2022 0330   LDLCALC 92 05/11/2023 0932   Hepatic Function Panel     Component Value Date/Time   PROT 7.3 05/11/2023 0932   ALBUMIN 4.3 05/11/2023 0932   AST 23 05/11/2023 0932   ALT 20 05/11/2023 0932   ALKPHOS 299 (H) 05/11/2023 0932   BILITOT 0.3 05/11/2023 0932   BILIDIR 0.0 02/12/2023 0857   IBILI 0.2 08/02/2020 1601      Component Value Date/Time   TSH 2.430 05/11/2023 0932   Nutritional Lab Results  Component Value Date   VD25OH 17.1 (L) 05/11/2023   VD25OH 28.19 (L) 01/31/2021   VD25OH 24 (L) 08/02/2020    Attestations:   I, ***, acting as a medical scribe for Thomasene Lot, DO., have compiled all relevant documentation for today's office visit on behalf of Thomasene Lot, DO, while in the presence of [employer], DO.  Reviewed by  clinician on day of visit: allergies, medications, problem list, medical history, surgical history, family history, social history, and previous encounter notes pertinent to patient's obesity diagnosis. I have spent 40 *** minutes in the care of the patient today including: preparing to see patient (e.g. review and interpretation of tests, old notes ), obtaining and/or reviewing separately obtained history, performing a medically appropriate examination or evaluation, counseling and educating the patient, ordering medications, test or procedures, documenting clinical information in the electronic or other health care record, and independently interpreting results and communicating results to the patient, family, or caregiver   I have reviewed the above documentation for accuracy and completeness, and I agree with the above. [name], D.O.  The 21st Century Cures Act was signed into law in 2016 which includes the topic of electronic health records.  This provides immediate access to information in MyChart.  This includes consultation notes, operative notes, office notes, lab results and pathology reports.  If you have any questions about what you read please let us know at your next visit so we can discuss your concerns and take corrective action if need be.  We are right here with you.

## 2023-07-31 ENCOUNTER — Other Ambulatory Visit (HOSPITAL_COMMUNITY): Payer: Self-pay

## 2023-08-03 ENCOUNTER — Ambulatory Visit (INDEPENDENT_AMBULATORY_CARE_PROVIDER_SITE_OTHER): Admitting: Physician Assistant

## 2023-08-03 ENCOUNTER — Encounter (INDEPENDENT_AMBULATORY_CARE_PROVIDER_SITE_OTHER): Payer: Self-pay | Admitting: Physician Assistant

## 2023-08-03 ENCOUNTER — Other Ambulatory Visit (HOSPITAL_COMMUNITY): Payer: Self-pay

## 2023-08-03 VITALS — BP 106/70 | HR 81 | Temp 98.0°F | Ht 61.0 in | Wt 238.0 lb

## 2023-08-03 DIAGNOSIS — Z6841 Body Mass Index (BMI) 40.0 and over, adult: Secondary | ICD-10-CM

## 2023-08-03 DIAGNOSIS — E1169 Type 2 diabetes mellitus with other specified complication: Secondary | ICD-10-CM

## 2023-08-03 DIAGNOSIS — E559 Vitamin D deficiency, unspecified: Secondary | ICD-10-CM

## 2023-08-03 MED ORDER — VITAMIN D (ERGOCALCIFEROL) 1.25 MG (50000 UNIT) PO CAPS
50000.0000 [IU] | ORAL_CAPSULE | ORAL | 0 refills | Status: DC
  Filled 2023-08-03 – 2023-09-02 (×5): qty 4, 28d supply, fill #0

## 2023-08-03 NOTE — Progress Notes (Signed)
 SUBJECTIVE: Discussed the use of AI scribe software for clinical note transcription with the patient, who gave verbal consent to proceed.  Chief Complaint: Obesity  Interim History: She is up 4 lbs since last visit.   Natalie is here to discuss her progress with her obesity treatment plan. She is on the Category 1 Plan and states she is following her eating plan approximately 40 % of the time. She states she is exercising walking one mile 23-27 minutes 5 times per week.  [name] is a 58 year old female with obesity who presents for follow-up on her obesity treatment plan.  She has a history of type 2 diabetes, hypertension, mixed hyperlipidemia, and vitamin D  deficiency. She previously underwent a laparoscopic sleeve gastrectomy and has a history of pancreatitis, which precludes the use of GLP-1 medications. She experienced gastrointestinal upset with metformin  in the past.  She is currently taking ergocalciferol  50,000 units once weekly for vitamin D  deficiency. She struggles with weight loss, particularly with controlling snack intake, especially sweets. She mentions portion control with meals, using smaller plates, and avoiding repetitive meals. She focuses on protein intake but finds it challenging to maintain focus on her weight loss goals.  She had gallbladder surgery in July of the previous year and lost a significant amount of weight post-surgery but has since struggled with weight gain. She is mindful of her diet, often preparing meals in an air fryer and avoiding fried foods. She uses portion control strategies, such as using smaller plates and limiting pasta and cheese intake.  She enjoys salads and uses balsamic vinaigrette dressing, specifically Newman's Own brand, and controls dressing portions using strategies like salad jars. She uses 'I can't believe it's not butter' spray and is cautious with other condiments.  She experiences gastrointestinal discomfort with  milk products, even lactose-free options, and has not tried Fairlife milk but is open to exploring it for its higher protein content. She is considering other breakfast options to increase protein intake, such as [place] yogurt and protein-rich pancakes.  She enjoys snacks like chips, Little Debbie snacks, and cakes, and acknowledges the need for avoidance of sugary snacks.   She has tried healthier alternatives like Outshine bars and is open to trying [place] yogurt bars for a sweet treat. She uses Splenda in her coffee but is mindful of the sugar content in creamers.  She is motivated to move out of the morbid obesity category and is concerned about her A1c levels.  Bariatric surgery; Sleeve gastrectomy- 2018- [facility] Dr Dorrie Gaudier. Lost 100 lbs after surgery- Peak wt. 305 lbs.  Pharmacotherapy: Hx of pancreatitis associated with gallstones-S/P cholecystectomy- so not a candidate for GLP-1 therapy.     Metformin  caused GI upset.  OBJECTIVE: Visit Diagnoses: Problem List Items Addressed This Visit     Morbid obesity (HCC)   Other Visit Diagnoses       Type 2 diabetes mellitus with obesity    -  Primary     Vitamin D  deficiency         BMI 45.0-49.9, adult (HCC) Current BMI 45.0          Obesity Struggling with weight loss, particularly with snack foods and sweets. Underwent laparoscopic sleeve gastrectomy. Not a candidate for GLP-1 medications due to pancreatitis. Previously experienced gastrointestinal upset with metformin . Discussed dietary habits, portion control, and potential alternatives for snacks and meals. Encouraged to increase protein intake to help with cravings. Discussed potential weight gain post-gallbladder surgery and strategies to  mitigate it. - Focus on portion control and mindful eating. - Increase protein intake, aiming for six ounces at dinner. - Consider alternatives for snacks, such as [place] yogurt bars and Fairlife milk. - Explore lactose-free milk options like Fairlife  milk for additional protein. - Try prepared breakfast options with higher protein content. - Consider using smaller mason jars for salads to control portions. - Explore soy products to help with hot flashes. - Avoid high-calorie and high-sugar snacks.  Type 2 diabetes mellitus Emphasis on dietary control to prevent increase in A1c levels. Lab Results  Component Value Date   HGBA1C 6.1 (H) 05/11/2023   HGBA1C 5.9 02/12/2023   HGBA1C 5.6 08/28/2022   Lab Results  Component Value Date   MICROALBUR <0.7 02/12/2023   LDLCALC 92 05/11/2023   CREATININE 1.08 (H) 05/11/2023  Not currently on medication for Type 2 diabetes.  ? Would be candidate for SGLT medications as might also help promote some weight loss.  She is working  on nutrition plan to decrease simple carbohydrates, increase lean proteins and exercise to promote weight loss and improve glycemic control . Recheck labs over the next couple of months .    Vitamin D  deficiency Currently on ergocalciferol  50,000 units once weekly. Refill needed. No N/V or muscle weakness with Ergo.  Last vitamin D  Lab Results  Component Value Date   VD25OH 17.1 (L) 05/11/2023   Continue Ergocalciferol  50,000 units once weekly.  Plan to recheck labs over the next couple of months.  Low vitamin D  levels can be associated with adiposity and may result in leptin resistance and weight gain. Also associated with fatigue.  Currently on vitamin D  supplementation without any adverse effects such as nausea, vomiting or muscle weakness.  Meds ordered this encounter  Medications   Vitamin D , Ergocalciferol , (DRISDOL ) 1.25 MG (50000 UNIT) CAPS capsule    Sig: Take 1 capsule (50,000 Units total) by mouth every 7 (seven) days.    Dispense:  4 capsule    Refill:  0    - Refill ergocalciferol  prescription. Vitals Temp: 98 F (36.7 C) BP: 106/70 Pulse Rate: 81 SpO2: 98 %   Anthropometric Measurements Height: 5\' 1"  (1.549 m) Weight: 238 lb (108  kg) BMI (Calculated): 44.99 Weight at Last Visit: 234lb Weight Lost Since Last Visit: 0 Weight Gained Since Last Visit: 4 lb Starting Weight: 239 lb Total Weight Loss (lbs): 1 lb (0.454 kg) Peak Weight: 302 lb   Body Composition  Body Fat %: 51 % Fat Mass (lbs): 121.4 lbs Muscle Mass (lbs): 110.8 lbs Total Body Water (lbs): 87.8 lbs Visceral Fat Rating : 17   Other Clinical Data Fasting: no Labs: no Today's Visit #: 4 Starting Date: 05/11/23     ASSESSMENT AND PLAN:  Diet: Scottlyn is currently in the action stage of change. As such, her goal is to continue with weight loss efforts. She has agreed to Category 1 Plan.  Exercise: Aletta has been instructed to try a geriatric exercise plan and that some exercise is better than none for weight loss and overall health benefits.   Behavior Modification:  We discussed the following Behavioral Modification Strategies today: increasing lean protein intake, decreasing simple carbohydrates, increasing vegetables, increase H2O intake, increase high fiber foods, meal planning and cooking strategies, better snacking choices, emotional eating strategies , avoiding temptations, and planning for success. We discussed various medication options to help Keyerra with her weight loss efforts and we both agreed to continue to work on nutritional and  behavioral strategies to promote weight loss.  .  Return in about 3 weeks (around 08/24/2023).Aaron Aas She was informed of the importance of frequent follow up visits to maximize her success with intensive lifestyle modifications for her multiple health conditions.  Attestation Statements:   Reviewed by clinician on day of visit: allergies, medications, problem list, medical history, surgical history, family history, social history, and previous encounter notes.   Time spent on visit including pre-visit chart review and post-visit care and charting was 41 minutes.    Paislyn Domenico, PA-C

## 2023-08-11 NOTE — Progress Notes (Signed)
 HPI: [name] is a 58 y.o. female with a PMHx significant for HTN, chronic venous insufficiency, HLD, DM II, benign positional vertigo, OA, anemia, and vitamin D  deficiency here today for chronic disease management.  Last seen on 02/12/2023  Hypertension:  Medications: Currently on amlodipine  2.5 mg daily and Indapamide  1.25 mg daily. Side effects: She mentions she has had some ankle swelling with the amlodipine .  Negative for unusual or severe headache, visual changes, exertional chest pain, dyspnea, or focal weakness.  Lab Results  Component Value Date   CREATININE 1.08 (H) 05/11/2023   BUN 20 05/11/2023   NA 142 05/11/2023   K 4.0 05/11/2023   CL 101 05/11/2023   CO2 27 05/11/2023   Hyperlipidemia: Currently on Ezetimibe  10 mg daily.  Side effects from medication: none Lab Results  Component Value Date   CHOL 203 (H) 05/11/2023   HDL 99 05/11/2023   LDLCALC 92 05/11/2023   TRIG 68 05/11/2023   CHOLHDL 2.1 05/11/2023   Diabetes Mellitus II: Dx'ed 09/2014. - Checking BG at home: She has not been checking her blood sugar at home.  - Medications: Not currently on pharmacologic treatment.  - Diet: She cooks at home and is trying to eat healthy, but is still eating sweets. She is following with the weight loss clinic every two weeks. Not keeping a food diary.  - Exercise: She walks occasionally but has not been walking as much lately due to right knee pain and fatigue.  - eye exam: UTD. She has an appointment in 2 weeks.  - foot exam: 08/2022. Performed today.  - Negative for symptoms of hypoglycemia, polyuria, polydipsia, numbness extremities, foot ulcers/trauma  Lab Results  Component Value Date   HGBA1C 6.1 (H) 05/11/2023   Lab Results  Component Value Date   MICROALBUR <0.7 02/12/2023   Concerns today:   Patient complains of fullness sensation in both ears. She says it is worse in her left ear.  Negative for recent URI or travel.  Review of Systems   Constitutional:  Negative for activity change, appetite change and fever.  HENT:  Negative for ear discharge, ear pain, facial swelling and sore throat.   Respiratory:  Negative for cough and wheezing.   Gastrointestinal:  Negative for abdominal pain, nausea and vomiting.  Genitourinary:  Negative for decreased urine volume, difficulty urinating, dysuria and hematuria.  Musculoskeletal:  Positive for arthralgias. Negative for gait problem.  Skin:  Negative for rash.  Neurological:  Negative for syncope, facial asymmetry and weakness.  Psychiatric/Behavioral:  Negative for confusion.   See other pertinent positives and negatives in HPI.  Current Outpatient Medications on File Prior to Visit  Medication Sig Dispense Refill   Accu-Chek Softclix Lancets lancets Use to check blood sugar daily. 100 each 12   acetaminophen  (TYLENOL ) 500 MG tablet Take 1,000 mg by mouth every 8 (eight) hours.     amLODipine  (NORVASC ) 5 MG tablet Take 1 tablet (5 mg total) by mouth daily. 90 tablet 0   Blood Glucose Monitoring Suppl (ACCU-CHEK AVIVA PLUS) w/Device KIT As directed. 1 kit 0   ezetimibe  (ZETIA ) 10 MG tablet Take 1 tablet (10 mg total) by mouth daily. 90 tablet 3   glucose blood (ACCU-CHEK GUIDE) test strip Use to test blood sugar daily. 100 each 12   ibuprofen (ADVIL) 600 MG tablet Take 600 mg by mouth every 6 (six) hours as needed for mild pain.     indapamide  (LOZOL ) 1.25 MG tablet Take 1 tablet (  1.25 mg total) by mouth daily. 90 tablet 2   meclizine  (ANTIVERT ) 25 MG tablet Take 1 tablet (25 mg total) by mouth daily as needed for dizziness. 30 tablet 0   tiZANidine  (ZANAFLEX ) 2 MG tablet Take 1 tablet (2 mg total) by mouth 2 (two) times daily as needed for muscle spasms. 60 tablet 1   Vitamin D , Ergocalciferol , (DRISDOL ) 1.25 MG (50000 UNIT) CAPS capsule Take 1 capsule (50,000 Units total) by mouth every 7 (seven) days. 4 capsule 0   No current facility-administered medications on file prior to  visit.   Past Medical History:  Diagnosis Date   Anemia    Back pain    Back pain    Clotting disorder (HCC)    Constipation    Diabetes mellitus without complication (HCC)    Edema 04/25/2015   Fatty liver    Gallbladder problem    Headache(784.0)    Herniated disc    High blood pressure    History of colonic polyps    Hypertension    Joint pain    Joint pain    Obesity    Osteoarthritis    Swelling of both lower extremities    TIA (transient ischemic attack) 2007   hx of   Vasculitis (HCC)    L leg - Polyarthritis   Venous insufficiency    Vitamin D  deficiency    Vitamin D  deficiency    Allergies  Allergen Reactions   Dilaudid  [Hydromorphone ] Itching   Social History   Socioeconomic History   Marital status: Married    Spouse name: Arnetta Lank   Number of children: Not on file   Years of education: Not on file   Highest education level: Not on file  Occupational History   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  Tobacco Use   Smoking status: Never   Smokeless tobacco: Never  Vaping Use   Vaping status: Never Used  Substance and Sexual Activity   Alcohol use: Yes    Alcohol/week: 0.0 standard drinks of alcohol    Comment: rarely   Drug use: No   Sexual activity: Yes    Birth control/protection: Surgical  Other Topics Concern   Not on file  Social History Narrative   Not on file   Social Drivers of Health   Financial Resource Strain: Not on file  Food Insecurity: No Food Insecurity (11/11/2022)   Hunger Vital Sign    Worried About Running Out of Food in the Last Year: Never true    Ran Out of Food in the Last Year: Never true  Transportation Needs: No Transportation Needs (11/11/2022)   PRAPARE - Administrator, Civil Service (Medical): No    Lack of Transportation (Non-Medical): No  Physical Activity: Not on file  Stress: Not on file  Social Connections: Unknown (10/12/2022)   Received from [facility]   Social Network    Social Network:  Not on file   Vitals:   08/13/23 0707  BP: 122/70  Pulse: 88  Resp: 16  Temp: 98.5 F (36.9 C)  SpO2: 96%   Body mass index is 45.91 kg/m.  Physical Exam Vitals and nursing note reviewed.  Constitutional:      General: She is not in acute distress.    Appearance: She is well-developed.  HENT:     Head: Normocephalic and atraumatic.     Right Ear: External ear normal. There is impacted cerumen.     Left Ear: External ear normal. There is impacted cerumen.  Mouth/Throat:     Mouth: Mucous membranes are moist.     Pharynx: Oropharynx is clear. Uvula midline.  Eyes:     Conjunctiva/sclera: Conjunctivae normal.  Cardiovascular:     Rate and Rhythm: Normal rate and regular rhythm.     Pulses:          Dorsalis pedis pulses are 2+ on the right side and 2+ on the left side.     Heart sounds: No murmur heard. Pulmonary:     Effort: Pulmonary effort is normal. No respiratory distress.     Breath sounds: Normal breath sounds.  Abdominal:     Palpations: Abdomen is soft. There is no hepatomegaly or mass.     Tenderness: There is no abdominal tenderness.  Musculoskeletal:     Right lower leg: No edema.     Left lower leg: No edema.  Lymphadenopathy:     Cervical: No cervical adenopathy.  Skin:    General: Skin is warm.     Findings: No erythema or rash.  Neurological:     General: No focal deficit present.     Mental Status: She is alert and oriented to person, place, and time.     Cranial Nerves: No cranial nerve deficit.     Gait: Gait normal.  Psychiatric:        Mood and Affect: Mood and affect normal.   ASSESSMENT AND PLAN:  Ms. Nissen was seen today for chronic disease management.   Orders Placed This Encounter  Procedures   Pneumococcal conjugate vaccine 20-valent (Prevnar 20)   POC HgB A1c   Lab Results  Component Value Date   HGBA1C 5.7 08/13/2023   Hyperlipidemia associated with type 2 diabetes mellitus (HCC) Assessment & Plan: Last LDL 92 in  04/2023. She has not tolerated statins. Currently on Zetia  10 mg daily. Low-fat and low carb diet recommended. We can plan on checking fasting lipid panel next visit.  Type 2 diabetes mellitus with other specified complication, without long-term current use of insulin  (HCC) Assessment & Plan: HgA1C at goal, it went from 6.1 to 5.7. Continue nonpharmacologic treatment. Regular exercise and healthy diet with avoidance of added sugar food intake is an important part of treatment and recommended. Annual eye exam, periodic dental and foot care recommended. F/U in 5-6 months.  Orders: -     POCT glycosylated hemoglobin (Hb A1C)  Essential hypertension, benign Assessment & Plan: BP adequately controlled. Continue amlodipine  5 mg 1/2 tablet daily, Indipam in 1.25 mg daily, and low-salt diet. Continue monitoring BP regularly. Eye exam is current.  Bilateral hearing loss due to cerumen impaction L>R. After discussing procedure, ear lavage, she would like to proceed.  Right cerumen impaction resolved with ear lavage, left ear lavage unsuccessful and still with decreased hearing. After verbal consent, I proceed with cerumen extraction using a curette. She tolerated procedure well, reports great improvement in hearing, back to her baseline. TM's with no erythema bilateral.  Ear Cerumen Removal  Date/Time: 08/13/2023 8:58 AM  Performed by: [place], Jossalin Chervenak G, MD Authorized by: [place], Shylah Dossantos G, MD   Anesthesia: Local Anesthetic: none Location details: left ear Patient tolerance: patient tolerated the procedure well with no immediate complications Procedure type: curette  Sedation: Patient sedated: no    Need for pneumococcal vaccination -     Pneumococcal conjugate vaccine 20-valent  Return in about 6 months (around 02/13/2024) for chronic problems.  I, Odelia Bender, acting as a scribe for Calla Wedekind [place], MD., have documented all  relevant documentation on the behalf of Tamecka Milham [place], MD,  as directed by  Shondrika Hoque [place], MD while in the presence of [place] Mckeough [place], MD.   I, Pearlie Lafosse [place], MD, have reviewed all documentation for this visit. The documentation on 08/13/23 for the exam, diagnosis, procedures, and orders are all accurate and complete.  Sonny Poth G. [place], MD  [facility]. [place] office.

## 2023-08-13 ENCOUNTER — Encounter: Payer: Self-pay | Admitting: Family Medicine

## 2023-08-13 ENCOUNTER — Ambulatory Visit: Admitting: Family Medicine

## 2023-08-13 ENCOUNTER — Ambulatory Visit: Payer: BC Managed Care – PPO | Admitting: Family Medicine

## 2023-08-13 VITALS — BP 122/70 | HR 88 | Temp 98.5°F | Resp 16 | Ht 61.0 in | Wt 243.0 lb

## 2023-08-13 DIAGNOSIS — E785 Hyperlipidemia, unspecified: Secondary | ICD-10-CM | POA: Diagnosis not present

## 2023-08-13 DIAGNOSIS — Z23 Encounter for immunization: Secondary | ICD-10-CM

## 2023-08-13 DIAGNOSIS — E1169 Type 2 diabetes mellitus with other specified complication: Secondary | ICD-10-CM | POA: Diagnosis not present

## 2023-08-13 DIAGNOSIS — H6123 Impacted cerumen, bilateral: Secondary | ICD-10-CM

## 2023-08-13 DIAGNOSIS — I10 Essential (primary) hypertension: Secondary | ICD-10-CM | POA: Diagnosis not present

## 2023-08-13 LAB — POCT GLYCOSYLATED HEMOGLOBIN (HGB A1C): HbA1c, POC (prediabetic range): 5.7 % (ref 5.7–6.4)

## 2023-08-13 NOTE — Patient Instructions (Addendum)
 A few things to remember from today's visit:  Hyperlipidemia associated with type 2 diabetes mellitus (HCC)  Essential hypertension, benign  Type 2 diabetes mellitus with other specified complication, without long-term current use of insulin  (HCC) - Plan: POC HgB A1c  Bilateral hearing loss due to cerumen impaction  No changes today. Avoid foods that contain sugar. Low impact exercise as tolerated.  If you need refills for medications you take chronically, please call your pharmacy. Do not use My Chart to request refills or for acute issues that need immediate attention. If you send a my chart message, it may take a few days to be addressed, specially if I am not in the office.  Please be sure medication list is accurate. If a new problem present, please set up appointment sooner than planned today.

## 2023-08-13 NOTE — Assessment & Plan Note (Signed)
 Last LDL 92 in 04/2023. She has not tolerated statins. Currently on Zetia  10 mg daily. Low-fat and low carb diet recommended. We can plan on checking fasting lipid panel next visit.

## 2023-08-13 NOTE — Assessment & Plan Note (Addendum)
 BP adequately controlled. Continue amlodipine  5 mg 1/2 tablet daily, Indipam in 1.25 mg daily, and low-salt diet. Continue monitoring BP regularly. Eye exam is current.

## 2023-08-13 NOTE — Assessment & Plan Note (Signed)
 HgA1C at goal, it went from 6.1 to 5.7. Continue nonpharmacologic treatment. Regular exercise and healthy diet with avoidance of added sugar food intake is an important part of treatment and recommended. Annual eye exam, periodic dental and foot care recommended. F/U in 5-6 months.

## 2023-08-17 ENCOUNTER — Ambulatory Visit (INDEPENDENT_AMBULATORY_CARE_PROVIDER_SITE_OTHER): Admitting: Family Medicine

## 2023-08-19 ENCOUNTER — Other Ambulatory Visit (HOSPITAL_COMMUNITY): Payer: Self-pay

## 2023-08-19 ENCOUNTER — Other Ambulatory Visit: Payer: Self-pay | Admitting: Family Medicine

## 2023-08-19 DIAGNOSIS — I10 Essential (primary) hypertension: Secondary | ICD-10-CM

## 2023-08-20 ENCOUNTER — Other Ambulatory Visit (HOSPITAL_COMMUNITY): Payer: Self-pay

## 2023-08-20 ENCOUNTER — Other Ambulatory Visit: Payer: Self-pay

## 2023-08-20 MED ORDER — AMLODIPINE BESYLATE 5 MG PO TABS
5.0000 mg | ORAL_TABLET | Freq: Every day | ORAL | 2 refills | Status: AC
Start: 2023-08-20 — End: ?
  Filled 2023-08-20: qty 90, 90d supply, fill #0

## 2023-08-21 ENCOUNTER — Other Ambulatory Visit (HOSPITAL_COMMUNITY): Payer: Self-pay

## 2023-08-22 NOTE — Progress Notes (Unsigned)
 SUBJECTIVE: Discussed the use of AI scribe software for clinical note transcription with the patient, who gave verbal consent to proceed.  Chief Complaint: Obesity  Interim History: She is up 5 lbs since last visit.  Muscle mass +2.8 lbs Adipose mass +2 lbs [name] is here to discuss her progress with her obesity treatment plan. She is on the Category 1 Plan and states she is following her eating plan approximately 80 % of the time. She states she is exercising walking 30-60 minutes 7 times per week.  [name] is a 58 year old female with obesity, hypertension, and hypercholesterolemia who presents for follow-up of her obesity treatment plan.  She has gained five pounds since her last visit, attributing part of this to increased muscle mass from regular physical activity. She adheres to her category one obesity treatment plan approximately 80% of the time. Her exercise routine includes walking for 30 to 60 minutes daily, and she is considering incorporating strength training exercises at home. She previously participated in a bariatric exercise class that included weight lifting.  Her nutritional intake focuses on reducing sugar by consuming apples and yogurt, and she drinks water with additives to improve taste due to a change in her taste for water following bariatric surgery. She occasionally indulges in sweets, such as cheesecake and pancakes, on special occasions. She consumes protein-rich foods like [place], fish, and cauliflower, avoids certain seafood, and uses alternatives like [place] yogurt to increase protein intake. She is exploring ways to incorporate more protein into her diet, such as through homemade smoothies and protein shakes.  She has a history of bariatric surgery and is not currently on any diabetes medication but has a history of metformin  and Ozempic  use. Her recent A1c was 5.7, indicating she is in the prediabetic range. She has not been checking her blood  sugars recently.  She is on a vitamin D  supplement due to a deficiency and has a history of low vitamin D  levels affecting her energy and well-being. Her alkaline phosphatase levels have improved, and she has had a bone density scan which was normal.  She has a history of gallbladder surgery, which affects her tolerance for certain foods like tuna with mayonnaise. She prefers to eat tuna with lemon pepper or on salads without dressings.  [place] She engages in regular physical activity, including walking and taking the stairs daily. She reports feeling tired and not at her best when her vitamin D  levels were low. No current issues with her thyroid  or insulin  levels.  Bariatric surgery; Sleeve gastrectomy- 2018- [facility] Dr Dorrie Gaudier. Lost 100 lbs after surgery- Peak wt. 305 lbs.  Pharmacotherapy: Hx of pancreatitis associated with gallstones-S/P cholecystectomy- so not a candidate for GLP-1 therapy.                                      Metformin  caused GI upset.  OBJECTIVE: Visit Diagnoses: Problem List Items Addressed This Visit     Essential hypertension, benign   Type 2 diabetes mellitus with other specified complication (HCC) - Primary   Morbid obesity (HCC)   Other Visit Diagnoses       Vitamin D  deficiency         BMI 45.0-49.9, adult (HCC) Current BMI 45.9          Obesity Weight has increased by five pounds since the  last visit, with a three-pound increase in muscle mass and a two-pound increase in fat mass. She adheres to the category one plan 80% of the time and engages in daily walking for 30 to 60 minutes. Emphasized the role of muscle mass in increasing caloric expenditure and aiding fat loss. - Continue the category one plan with a focus on protein intake and hydration. - Initiate strength training exercises, possibly using hand weights at home. - Maintain daily walking for 30 to 60 minutes. - Utilize YouTube for gentle strength training  exercises. - Consider protein shakes as meal replacements.  Type 2 diabetes A1c is 5.7, indicating good control. No diabetes medication is currently required. Discussed Jardiance  as a potential option for weight loss and diabetes management if needed. Lab Results  Component Value Date   HGBA1C 5.7 08/13/2023   HGBA1C 6.1 (H) 05/11/2023   HGBA1C 5.9 02/12/2023   Lab Results  Component Value Date   MICROALBUR <0.7 02/12/2023   LDLCALC 92 05/11/2023   CREATININE 1.08 (H) 05/11/2023   INSULIN   Date Value Ref Range Status  05/11/2023 8.9 2.6 - 24.9 uIU/mL Final  ]She is working  on nutrition plan to decrease simple carbohydrates, increase lean proteins and exercise to promote weight loss and improve glycemic control .  - Re-evaluate A1c levels in July. - Consider Jardiance  for weight loss and diabetes management if necessary.   Hypertension Hypertension asymptomatic, reasonably well controlled, and no significant medication side effects noted.  Medication(s): amlodipine  5 mg daily   Lozol  1.25 mg daily  BP Readings from Last 3 Encounters:  08/23/23 100/65  08/13/23 122/70  08/03/23 106/70   Lab Results  Component Value Date   CREATININE 1.08 (H) 05/11/2023   CREATININE 1.10 02/12/2023   CREATININE 1.04 (H) 11/10/2022   Lab Results  Component Value Date   GFR 56.37 (L) 02/12/2023   GFR 61.19 08/28/2022   GFR 51.09 (L) 03/30/2022    Plan: Continue all antihypertensives at current dosages.  Vitamin D  deficiency Managed with supplementation. Bariatric surgery may affect absorption. Emphasized the importance of maintaining adequate vitamin D  levels to prevent bone loss and fatigue.On Ergocalciferol  50,000 units weekly. No N/V or muscle weakness with Ergocalciferol .  Last vitamin D  Lab Results  Component Value Date   VD25OH 17.1 (L) 05/11/2023    - Continue vitamin D  supplementation- ergocalciferol  50,000 units weekly.  Low vitamin D  levels can be associated with  adiposity and may result in leptin resistance and weight gain. Also associated with fatigue.  Currently on vitamin D  supplementation without any adverse effects such as nausea, vomiting or muscle weakness.  - Monitor vitamin D  levels and bone health.  Vitals Temp: 97.6 F (36.4 C) BP: 100/65 Pulse Rate: 75 SpO2: 98 %   Anthropometric Measurements Height: 5\' 1"  (1.549 m) Weight: 243 lb (110.2 kg) BMI (Calculated): 45.94 Weight at Last Visit: 238 lb Weight Lost Since Last Visit: 0 Weight Gained Since Last Visit: 5 lb Starting Weight: 239 lb Total Weight Loss (lbs): 0 lb (0 kg) Peak Weight: 302 lb   Body Composition  Body Fat %: 50.8 % Fat Mass (lbs): 123.4 lbs Muscle Mass (lbs): 113.6 lbs Total Body Water (lbs): 87.4 lbs Visceral Fat Rating : 18   Other Clinical Data Fasting: No Labs: No Today's Visit #: 5 Starting Date: 05/11/23     ASSESSMENT AND PLAN:  Diet: [name] is currently in the action stage of change. As such, her goal is to continue with weight loss efforts.  She has agreed to Category 1 Plan.  Exercise: [name] has been instructed to work up to a goal of 150 minutes of combined cardio and strengthening exercise per week for weight loss and overall health benefits.   Behavior Modification:  We discussed the following Behavioral Modification Strategies today: increasing lean protein intake, decreasing simple carbohydrates, increasing vegetables, increase H2O intake, increase high fiber foods, no skipping meals, meal planning and cooking strategies, better snacking choices, avoiding temptations, and planning for success. We discussed various medication options to help [name] with her weight loss efforts and we both agreed to continue current treatment plan, continue to work on nutritional and behavioral strategies to promote weight loss.  .  Return in about 3 weeks (around 09/13/2023).Aaron Aas She was informed of the importance of frequent follow up visits to  maximize her success with intensive lifestyle modifications for her multiple health conditions.  Attestation Statements:   Reviewed by clinician on day of visit: allergies, medications, problem list, medical history, surgical history, family history, social history, and previous encounter notes.   Time spent on visit including pre-visit chart review and post-visit care and charting was 34 minutes.    Caroleen Stoermer, PA-C

## 2023-08-23 ENCOUNTER — Encounter (INDEPENDENT_AMBULATORY_CARE_PROVIDER_SITE_OTHER): Payer: Self-pay | Admitting: Physician Assistant

## 2023-08-23 ENCOUNTER — Ambulatory Visit (INDEPENDENT_AMBULATORY_CARE_PROVIDER_SITE_OTHER): Admitting: Physician Assistant

## 2023-08-23 VITALS — BP 100/65 | HR 75 | Temp 97.6°F | Ht 61.0 in | Wt 243.0 lb

## 2023-08-23 DIAGNOSIS — E1169 Type 2 diabetes mellitus with other specified complication: Secondary | ICD-10-CM

## 2023-08-23 DIAGNOSIS — Z9884 Bariatric surgery status: Secondary | ICD-10-CM

## 2023-08-23 DIAGNOSIS — E559 Vitamin D deficiency, unspecified: Secondary | ICD-10-CM | POA: Diagnosis not present

## 2023-08-23 DIAGNOSIS — Z6841 Body Mass Index (BMI) 40.0 and over, adult: Secondary | ICD-10-CM

## 2023-08-23 DIAGNOSIS — I10 Essential (primary) hypertension: Secondary | ICD-10-CM

## 2023-08-31 ENCOUNTER — Other Ambulatory Visit (HOSPITAL_COMMUNITY): Payer: Self-pay

## 2023-09-02 ENCOUNTER — Other Ambulatory Visit (HOSPITAL_COMMUNITY): Payer: Self-pay

## 2023-09-29 ENCOUNTER — Other Ambulatory Visit (HOSPITAL_COMMUNITY): Payer: Self-pay

## 2023-09-29 ENCOUNTER — Other Ambulatory Visit (INDEPENDENT_AMBULATORY_CARE_PROVIDER_SITE_OTHER): Payer: Self-pay | Admitting: Physician Assistant

## 2023-09-29 ENCOUNTER — Encounter: Payer: Self-pay | Admitting: Family Medicine

## 2023-09-29 ENCOUNTER — Other Ambulatory Visit: Payer: Self-pay | Admitting: Family Medicine

## 2023-09-29 DIAGNOSIS — E1169 Type 2 diabetes mellitus with other specified complication: Secondary | ICD-10-CM

## 2023-09-29 MED ORDER — EZETIMIBE 10 MG PO TABS
10.0000 mg | ORAL_TABLET | Freq: Every day | ORAL | 3 refills | Status: AC
  Filled 2023-09-29: qty 90, 90d supply, fill #0
  Filled 2024-01-11 – 2024-01-25 (×2): qty 90, 90d supply, fill #1
  Filled 2024-04-14: qty 90, 90d supply, fill #2

## 2023-09-29 MED ORDER — AMLODIPINE BESYLATE 2.5 MG PO TABS
2.5000 mg | ORAL_TABLET | Freq: Every day | ORAL | 3 refills | Status: AC
  Filled 2023-09-29: qty 90, 90d supply, fill #0
  Filled 2024-01-11 – 2024-01-25 (×2): qty 90, 90d supply, fill #1
  Filled 2024-04-14: qty 90, 90d supply, fill #2

## 2023-09-30 ENCOUNTER — Other Ambulatory Visit (HOSPITAL_COMMUNITY): Payer: Self-pay

## 2023-10-05 ENCOUNTER — Ambulatory Visit (INDEPENDENT_AMBULATORY_CARE_PROVIDER_SITE_OTHER): Admitting: Family Medicine

## 2023-10-14 NOTE — Progress Notes (Signed)
 [facility] Quality Team Note  Name: [name] Date of Birth: [birth year] MRN: [id number] Date: 10/14/2023  [facility] Quality Team has reviewed this patient's chart, please see recommendations below:  [facility] Quality Other; (Chart reviewed. Patient needs urine microalbumin/creatinine ratio for gap closure.)

## 2023-10-21 ENCOUNTER — Encounter: Payer: Self-pay | Admitting: Family Medicine

## 2023-10-22 ENCOUNTER — Encounter: Payer: Self-pay | Admitting: Family Medicine

## 2023-10-22 ENCOUNTER — Other Ambulatory Visit: Payer: Self-pay | Admitting: Family Medicine

## 2023-10-22 NOTE — Telephone Encounter (Signed)
 Pt does have diabetes, but last A1C was 5.7

## 2023-10-25 ENCOUNTER — Encounter (INDEPENDENT_AMBULATORY_CARE_PROVIDER_SITE_OTHER): Payer: Self-pay

## 2023-10-26 ENCOUNTER — Ambulatory Visit (INDEPENDENT_AMBULATORY_CARE_PROVIDER_SITE_OTHER): Admitting: Family Medicine

## 2023-10-26 ENCOUNTER — Encounter (INDEPENDENT_AMBULATORY_CARE_PROVIDER_SITE_OTHER): Payer: Self-pay | Admitting: Family Medicine

## 2023-10-26 ENCOUNTER — Other Ambulatory Visit (HOSPITAL_COMMUNITY): Payer: Self-pay

## 2023-10-26 VITALS — BP 117/78 | HR 78 | Temp 97.8°F | Ht 61.0 in | Wt 250.0 lb

## 2023-10-26 DIAGNOSIS — E559 Vitamin D deficiency, unspecified: Secondary | ICD-10-CM

## 2023-10-26 DIAGNOSIS — I152 Hypertension secondary to endocrine disorders: Secondary | ICD-10-CM

## 2023-10-26 DIAGNOSIS — Z6841 Body Mass Index (BMI) 40.0 and over, adult: Secondary | ICD-10-CM

## 2023-10-26 DIAGNOSIS — E1159 Type 2 diabetes mellitus with other circulatory complications: Secondary | ICD-10-CM | POA: Diagnosis not present

## 2023-10-26 DIAGNOSIS — E1169 Type 2 diabetes mellitus with other specified complication: Secondary | ICD-10-CM

## 2023-10-26 MED ORDER — VITAMIN D (ERGOCALCIFEROL) 1.25 MG (50000 UNIT) PO CAPS
50000.0000 [IU] | ORAL_CAPSULE | ORAL | 0 refills | Status: DC
  Filled 2023-10-26: qty 4, 28d supply, fill #0

## 2023-10-26 NOTE — Progress Notes (Signed)
 [name], D.O.  ABFM, ABOM Specializing in Clinical Bariatric Medicine  Office located at: [address] [place], [place]  [address]   Assessment and Plan:   Medications Discontinued During This Encounter  Medication Reason   Vitamin D , Ergocalciferol , (DRISDOL ) 1.25 MG (50000 UNIT) CAPS capsule Reorder     Meds ordered this encounter  Medications   Vitamin D , Ergocalciferol , (DRISDOL ) 1.25 MG (50000 UNIT) CAPS capsule    Sig: Take 1 capsule (50,000 Units total) by mouth every 7 (seven) days.    Dispense:  4 capsule    Refill:  0     FOR THE DISEASE OF OBESITY:  BMI 45.0-49.9, adult (HCC) Current BMI 47.24 Morbid obesity starting 45.18 Assessment & Plan: Since last office visit on 08/23/2023 patient's muscle mass has decreased by 1.4 lbs. Fat mass has increased by 8.4 lbs. Total body water has increased by 4.8 lbs.  Counseling done on how various foods will affect these numbers and how to maximize success  Total lbs lost to date: + 11 lbs  Total weight loss percentage to date: +4.60%    Recommended Dietary Goals [name] is currently in the action stage of change. As such, her goal is to continue weight management plan.  She has agreed to START journaling 1100-1200 calories and 80+ grams protein daily using the CAT 1 MP with B & L options as a guide.    Behavioral Intervention We discussed the following today: increasing lean protein intake to established goals, decreasing simple carbohydrates , work on tracking and journaling calories using tracking application, and continue to work on implementation of reduced calorie nutritional plan  Additional resources provided today: Handout on CAT 1 meal plan , Handout on CAT 1-2 breakfast options, Handout on CAT 1-2 lunch options, Physician provided patient with handouts and personalized instruction on tracking and journaling using Apps (or how to handwrite in notebook) and using logs provided , and Handout on Common  Characteristics of Successful Weight Losers,  Evidence-based interventions for health behavior change were utilized today including the discussion of self monitoring techniques, problem-solving barriers and SMART goal setting techniques.   Regarding patient's less desirable eating habits and patterns, we employed the technique of small changes.   Pt will specifically work on: n/a   Recommended Physical Activity Goals [name] has been advised to work up to 300-450 minutes of moderate intensity aerobic activity a week and strengthening exercises 2-3 times per week for cardiovascular health, weight loss maintenance and preservation of muscle mass.   She may gradually increase the amount and intensity of her exercise routine.    Pharmacotherapy - Was educated on GLP-1 receptor agonists, their role in managing obesity-related conditions and commonly associated side effects. -  Pt understands with her history of pancreatitis, she would be at significant increased risk for repeat pancreatitis.  - Explained that she would likely be on one of these medications for life if she does not learn how to properly eat.  - After extensive discussion, she would like to work on becoming more aware of what she's eating by journaling her intake and focusing on decreasing simple carbs/ sugars and increasing fiber and protein.   ASSOCIATED CONDITIONS ADDRESSED TODAY:   Type 2 diabetes mellitus with obesity Assessment & Plan: Lab Results  Component Value Date   HGBA1C 5.7 08/13/2023   HGBA1C 6.1 (H) 05/11/2023   HGBA1C 5.9 02/12/2023   INSULIN  8.9 05/11/2023   INSULIN  CANCELED 07/12/2019    Managed with  diet and life style interventions. HgbA1c is at goal for age and comorbid conditions. Denies symptoms of hypoglycemia or hyperglycemia. She has cravings for sweets.  - Extensive discussion had regarding GLP-1 receptor agonists (see pharmacotherapy note above).  - Pt will work on increasing protein intake  for craving suppression and reducing simple and added sugars in her diet - Pt encouraged to journal her intake with a focus on decreasing simple carbs/ sugars and increasing fiber and protein.    Hypertension associated with type 2 diabetes mellitus (HCC) Assessment & Plan: Last 3 blood pressure readings in our office are as follows: BP Readings from Last 3 Encounters:  10/26/23 117/78  08/23/23 100/65  08/13/23 122/70   The 10-year ASCVD risk score (Arnett DK, et al., 2019) is: 5.5%  Lab Results  Component Value Date   CREATININE 1.08 (H) 05/11/2023   Blood pressure is controlled on Indapamide  1.25 mg daily and Amlodipine  2.5 mg daily.  No acute concerns.   - Continue adherence to medications and low sodium diet.  - Advance exercise as able.    Vitamin D  deficiency Assessment & Plan: Lab Results  Component Value Date   VD25OH 17.1 (L) 05/11/2023   VD25OH 28.19 (L) 01/31/2021   VD25OH 24 (L) 08/02/2020   Pt is on weekly prescription vitamin D  without any complications.   - Continue supplementation and weight loss efforts - Recheck levels as deemed clinically necessary.     Follow up:   Return 11/11/2023 at 7:40 AM.  She was informed of the importance of frequent follow up visits to maximize her success with intensive lifestyle modifications for her multiple health conditions.   Subjective:   Chief complaint: Obesity [name] is here to discuss her progress with her obesity treatment plan. She is on the Category 1 Plan and states she is following her eating plan approximately 70% of the time. She states she is walking 25 minutes (1 mile) 5 days a week.   Interval History:  [name] is here for a follow up office visit. Since last OV on 08/23/2023 , she is up 7 lbs. She has a smoothie with greek yogurt for breakfast. She has cravings for sweets. She inquires about medications for weight loss.   Pharmacotherapy that aid with weight loss:  none    Review of  Systems:  Pertinent positives were addressed with patient today.  Reviewed by clinician on day of visit: allergies, medications, problem list, medical history, surgical history, family history, social history, and previous encounter notes.  Weight Summary and Biometrics   Weight Lost Since Last Visit: 0lb  Weight Gained Since Last Visit: 7lb   Vitals Temp: 97.8 F (36.6 C) BP: 117/78 Pulse Rate: 78 SpO2: 98 %   Anthropometric Measurements Height: 5' 1 (1.549 m) Weight: 250 lb (113.4 kg) BMI (Calculated): 47.26 Weight at Last Visit: 243lb Weight Lost Since Last Visit: 0lb Weight Gained Since Last Visit: 7lb Starting Weight: 239lb Total Weight Loss (lbs): 0 lb (0 kg)   Body Composition  Body Fat %: 52.7 % Fat Mass (lbs): 131.8 lbs Muscle Mass (lbs): 112.2 lbs Total Body Water (lbs): 92.2 lbs Visceral Fat Rating : 19   Other Clinical Data Fasting: No Labs: no Today's Visit #: 6 Starting Date: 05/11/23 Comments: Cat 1    Objective:   PHYSICAL EXAM: Blood pressure 117/78, pulse 78, temperature 97.8 F (36.6 C), height 5' 1 (1.549 m), weight 250 lb (113.4 kg), SpO2 98%. Body mass index is 47.24 kg/m.  General: she is overweight, cooperative and in no acute distress. PSYCH: Has normal mood, affect and thought process.   HEENT: EOMI, sclerae are anicteric. Lungs: Normal breathing effort, no conversational dyspnea. Extremities: Moves * 4 Neurologic: A and O * 3, good insight  DIAGNOSTIC DATA REVIEWED: BMET    Component Value Date/Time   NA 142 05/11/2023 0932   K 4.0 05/11/2023 0932   CL 101 05/11/2023 0932   CO2 27 05/11/2023 0932   GLUCOSE 90 05/11/2023 0932   GLUCOSE 88 02/12/2023 0857   BUN 20 05/11/2023 0932   CREATININE 1.08 (H) 05/11/2023 0932   CREATININE 1.10 (H) 08/02/2020 1601   CALCIUM  9.8 05/11/2023 0932   GFRNONAA >60 11/10/2022 0026   GFRNONAA 55 (L) 11/29/2019 0905   GFRAA 64 11/29/2019 0905   Lab Results  Component Value Date    HGBA1C 5.7 08/13/2023   HGBA1C  11/02/2006    6.0 (NOTE)   The ADA recommends the following therapeutic goals for glycemic   control related to Hgb A1C measurement:   Goal of Therapy:   < 7.0% Hgb A1C   Action Suggested:  > 8.0% Hgb A1C   Ref:  Diabetes Care, 22, Suppl. 1, 1999   Lab Results  Component Value Date   INSULIN  8.9 05/11/2023   INSULIN  CANCELED 07/12/2019   Lab Results  Component Value Date   TSH 2.430 05/11/2023   CBC    Component Value Date/Time   WBC 5.5 05/11/2023 0932   WBC 6.6 11/09/2022 0218   RBC 5.04 05/11/2023 0932   RBC 4.28 11/09/2022 0218   HGB 13.4 05/11/2023 0932   HCT 41.2 05/11/2023 0932   PLT 258 05/11/2023 0932   MCV 82 05/11/2023 0932   MCH 26.6 05/11/2023 0932   MCH 26.2 11/09/2022 0218   MCHC 32.5 05/11/2023 0932   MCHC 32.1 11/09/2022 0218   RDW 14.4 05/11/2023 0932   Iron Studies    Component Value Date/Time   IRON 74 05/15/2019 0750   FERRITIN 137.0 05/15/2019 0750   IRONPCTSAT 23.2 05/15/2019 0750   Lipid Panel     Component Value Date/Time   CHOL 203 (H) 05/11/2023 0932   TRIG 68 05/11/2023 0932   HDL 99 05/11/2023 0932   CHOLHDL 2.1 05/11/2023 0932   CHOLHDL 2.6 11/06/2022 0330   VLDL 10 11/06/2022 0330   LDLCALC 92 05/11/2023 0932   Hepatic Function Panel     Component Value Date/Time   PROT 7.3 05/11/2023 0932   ALBUMIN 4.3 05/11/2023 0932   AST 23 05/11/2023 0932   ALT 20 05/11/2023 0932   ALKPHOS 299 (H) 05/11/2023 0932   BILITOT 0.3 05/11/2023 0932   BILIDIR 0.0 02/12/2023 0857   IBILI 0.2 08/02/2020 1601      Component Value Date/Time   TSH 2.430 05/11/2023 0932   Nutritional Lab Results  Component Value Date   VD25OH 17.1 (L) 05/11/2023   VD25OH 28.19 (L) 01/31/2021   VD25OH 24 (L) 08/02/2020    Attestations:   I, [name], acting as a Stage manager for Natalie Jenkins, [name]., have compiled all relevant documentation for today's office visit on behalf of Natalie Jenkins, [name], while in the  presence of [employer], [name].  Reviewed by clinician on day of visit: allergies, medications, problem list, medical history, surgical history, family history, social history, and previous encounter notes pertinent to patient's obesity diagnosis.  I have spent 40 minutes in the care of the patient today including 30 minutes face-to-face assessing  and reviewing listed medical problems above as outlined in office visit note and providing nutritional and behavioral counseling as outlined in obesity care plan.   I have reviewed the above documentation for accuracy and completeness, and I agree with the above. [name], D.O.  The 21st Century Cures Act was signed into law in 2016 which includes the topic of electronic health records.  This provides immediate access to information in MyChart.  This includes consultation notes, operative notes, office notes, lab results and pathology reports.  If you have any questions about what you read please let us  know at your next visit so we can discuss your concerns and take corrective action if need be.  We are right here with you.

## 2023-11-11 ENCOUNTER — Ambulatory Visit (INDEPENDENT_AMBULATORY_CARE_PROVIDER_SITE_OTHER): Admitting: Family Medicine

## 2023-11-11 ENCOUNTER — Other Ambulatory Visit (HOSPITAL_COMMUNITY): Payer: Self-pay

## 2023-11-11 ENCOUNTER — Other Ambulatory Visit: Payer: Self-pay | Admitting: Family Medicine

## 2023-11-11 ENCOUNTER — Encounter (INDEPENDENT_AMBULATORY_CARE_PROVIDER_SITE_OTHER): Payer: Self-pay | Admitting: Family Medicine

## 2023-11-11 VITALS — BP 116/73 | HR 65 | Temp 98.0°F | Ht 61.0 in | Wt 247.0 lb

## 2023-11-11 DIAGNOSIS — E559 Vitamin D deficiency, unspecified: Secondary | ICD-10-CM

## 2023-11-11 DIAGNOSIS — E1169 Type 2 diabetes mellitus with other specified complication: Secondary | ICD-10-CM

## 2023-11-11 DIAGNOSIS — E669 Obesity, unspecified: Secondary | ICD-10-CM | POA: Diagnosis not present

## 2023-11-11 DIAGNOSIS — E1159 Type 2 diabetes mellitus with other circulatory complications: Secondary | ICD-10-CM | POA: Diagnosis not present

## 2023-11-11 DIAGNOSIS — E785 Hyperlipidemia, unspecified: Secondary | ICD-10-CM | POA: Diagnosis not present

## 2023-11-11 DIAGNOSIS — I10 Essential (primary) hypertension: Secondary | ICD-10-CM

## 2023-11-11 DIAGNOSIS — I152 Hypertension secondary to endocrine disorders: Secondary | ICD-10-CM

## 2023-11-11 DIAGNOSIS — Z6841 Body Mass Index (BMI) 40.0 and over, adult: Secondary | ICD-10-CM

## 2023-11-11 MED ORDER — VITAMIN D (ERGOCALCIFEROL) 1.25 MG (50000 UNIT) PO CAPS
50000.0000 [IU] | ORAL_CAPSULE | ORAL | 0 refills | Status: AC
Start: 2023-11-11 — End: ?
  Filled 2023-11-11 – 2023-11-16 (×2): qty 4, 28d supply, fill #0

## 2023-11-11 NOTE — Progress Notes (Signed)
 [name], D.O.  ABFM, ABOM Specializing in Clinical Bariatric Medicine  Office located at: [address] [place], [place]  [address]   Assessment and Plan:   Orders Placed This Encounter  Procedures   VITAMIN D  25 Hydroxy (Vit-D Deficiency, Fractures)   Hemoglobin A1c   VITAMIN D  25 Hydroxy (Vit-D Deficiency, Fractures)   Medications Discontinued During This Encounter  Medication Reason   Vitamin D , Ergocalciferol , (DRISDOL ) 1.25 MG (50000 UNIT) CAPS capsule Reorder    Meds ordered this encounter  Medications   Vitamin D , Ergocalciferol , (DRISDOL ) 1.25 MG (50000 UNIT) CAPS capsule    Sig: Take 1 capsule (50,000 Units total) by mouth every 7 (seven) days.    Dispense:  4 capsule    Refill:  0      FOR THE DISEASE OF OBESITY:  Morbid obesity starting 45.18 BMI 45.0-49.9, adult (HCC) Current BMI 47.67 Assessment & Plan: Since last office visit on 10/26/23 patient's muscle mass has increased by 3 lbs. Fat mass has decreased by 5.6 lbs. Total body water has decreased by 4.6 lbs.  Body fat % has decreased by 1.7%. Counseling done on how various foods will affect these numbers and how to maximize success  Total lbs lost to date: +8 lbs Total weight loss percentage to date: +3.35 %   Recommended Dietary Goals [name] is currently in the action stage of change. As such, her goal is to continue weight management plan.  She has agreed to: continue current plan   Behavioral Intervention We discussed the following today: increasing lean protein intake to established goals, decreasing simple carbohydrates , avoiding skipping meals, increasing water intake , and work on tracking and journaling calories using tracking application, discussed spreading out her protein intake throughout the day, break her meals into smaller meals throughout the day to ensure she is still eating the recommended amount of food per day, reviewed handout of Successful Weight Losers (pt was  already provided handout).   Additional resources provided today: None  Evidence-based interventions for health behavior change were utilized today including the discussion of self monitoring techniques, problem-solving barriers and SMART goal setting techniques.   Regarding patient's less desirable eating habits and patterns, we employed the technique of small changes.   Pt will specifically work on: Journal her calorie/protein intake daily AND bring her log with her to her next OV   Recommended Physical Activity Goals [name] has been advised to work up to 300-450 minutes of moderate intensity aerobic activity a week and strengthening exercises 2-3 times per week for cardiovascular health, weight loss maintenance and preservation of muscle mass.   She has agreed to: Continue current level of physical activity and gradually increase.    Pharmacotherapy We both agreed to: Continue with current nutritional and behavioral strategies   ASSOCIATED CONDITIONS ADDRESSED TODAY:  Type 2 diabetes mellitus with obesity Assessment & Plan: Lab Results  Component Value Date   HGBA1C 5.7 08/13/2023   HGBA1C 6.1 (H) 05/11/2023   HGBA1C 5.9 02/12/2023   INSULIN  8.9 05/11/2023   INSULIN  CANCELED 07/12/2019    No current meds; diet and lifestyle interventions. No acute concerns today.   Continue working on increasing her protein intake, reducing processed foods, and eating the recommended amount of food per day. Rechecking A1c today. Will review results at next OV.    Hypertension associated with type 2 diabetes mellitus [name]) Assessment & Plan: BP Readings from Last 3 Encounters:  11/11/23 116/73  10/26/23 117/78  08/23/23  100/65   BP at goal. On Amlodipine  2.5 mg once daily and Lozol  1.25 mg once daily. Compliant and tolerating well.   Continue with current antihypertensive regimen as prescribed. Properly hydrate by drinking at least 1/2 her body weight in ounces of water per day.  Continue low-salt and high protein diet. Encouraged pt to gradually increase her physical activity. Will continue monitoring.     Vitamin D  deficiency Assessment & Plan: Currently on ERGO 50K units once daily. Tolerating well, no SE. Continue supplementation, refilling ERGO today. Will recheck vit D levels and review results at next OV.    Hyperlipidemia associated with type 2 diabetes mellitus [facility]) Assessment & Plan: Lab Results  Component Value Date   CHOL 203 (H) 05/11/2023   HDL 99 05/11/2023   LDLCALC 92 05/11/2023   TRIG 68 05/11/2023   CHOLHDL 2.1 05/11/2023   Pt has a hx of statin myopathy. Taking Zetia  10 mg once daily with good compliance and tolerance. Cholesterol is not at goal, but she is unable to take statins.   LDL goal of <70 reviewed with patient. Continue with current med regimen as prescribed. Continue working on reducing simple carbs and saturated/trans fats. Encouraged pt to gradually increase her physical activity.     Follow up:   Return in about 3 weeks (around 12/02/2023) for f/u on 12/02/2023 at 2:40 PM.  She was informed of the importance of frequent follow up visits to maximize her success with intensive lifestyle modifications for her multiple health conditions.  Subjective:   Chief complaint: Obesity [name] is here to discuss her progress with her obesity treatment plan. She is on keeping a food journal and adhering to recommended goals of 1100-1200 calories and 80+ g of protein daily using the CAT 1 MP with B & L options as a guide and states she is following her eating plan approximately 90% of the time. She states she is walking 30 minutes 1 day per week.   Interval History:  [name] is here for a follow up office visit. Since last OV on 10/26/23, she is lost 3 lbs. For breakfast she has been eating a boiled egg and a piece of toast (45 cal bread) and eats a salad for lunch. She tends to have more variety with dinner. Overall, she does not  believe she is eating sufficient portions of food at each meal, stating she tends to get full fast. Enjoys zero sugar [place] yogurt w one packet of Splenda. Enjoys peppers, dislikes onions.   Pharmacotherapy that aid with weight loss: She is currently taking no anti-obesity medication.    Review of Systems:  Pertinent positives were addressed with patient today.  Reviewed by clinician on day of visit: allergies, medications, problem list, medical history, surgical history, family history, social history, and previous encounter notes.  Weight Summary and Biometrics   Weight Lost Since Last Visit: 3lb  Weight Gained Since Last Visit: 0lb    Vitals Temp: 98 F (36.7 C) BP: 116/73 Pulse Rate: 65 SpO2: 97 %   Anthropometric Measurements Height: 5' 1 (1.549 m) Weight: 247 lb (112 kg) BMI (Calculated): 46.69 Weight at Last Visit: 250lb Weight Lost Since Last Visit: 3lb Weight Gained Since Last Visit: 0lb Starting Weight: 239lb Total Weight Loss (lbs): 0 lb (0 kg)   Body Composition  Body Fat %: 51 % Fat Mass (lbs): 126.2 lbs Muscle Mass (lbs): 115.2 lbs Total Body Water (lbs): 87.6 lbs Visceral Fat Rating : 18   Other  Clinical Data Fasting: Yes Labs: no Today's Visit #: 7 Starting Date: 05/11/23 Comments: Cat 1    Objective:   PHYSICAL EXAM: Blood pressure 116/73, pulse 65, temperature 98 F (36.7 C), height 5' 1 (1.549 m), weight 247 lb (112 kg), SpO2 97%. Body mass index is 46.67 kg/m.  General: she is overweight, cooperative and in no acute distress. PSYCH: Has normal mood, affect and thought process.   HEENT: EOMI, sclerae are anicteric. Lungs: Normal breathing effort, no conversational dyspnea. Extremities: Moves * 4 Neurologic: A and O * 3, good insight  DIAGNOSTIC DATA REVIEWED: BMET    Component Value Date/Time   NA 142 05/11/2023 0932   K 4.0 05/11/2023 0932   CL 101 05/11/2023 0932   CO2 27 05/11/2023 0932   GLUCOSE 90 05/11/2023 0932    GLUCOSE 88 02/12/2023 0857   BUN 20 05/11/2023 0932   CREATININE 1.08 (H) 05/11/2023 0932   CREATININE 1.10 (H) 08/02/2020 1601   CALCIUM  9.8 05/11/2023 0932   GFRNONAA >60 11/10/2022 0026   GFRNONAA 55 (L) 11/29/2019 0905   GFRAA 64 11/29/2019 0905   Lab Results  Component Value Date   HGBA1C 5.7 08/13/2023   HGBA1C  11/02/2006    6.0 (NOTE)   The ADA recommends the following therapeutic goals for glycemic   control related to Hgb A1C measurement:   Goal of Therapy:   < 7.0% Hgb A1C   Action Suggested:  > 8.0% Hgb A1C   Ref:  Diabetes Care, 22, Suppl. 1, 1999   Lab Results  Component Value Date   INSULIN  8.9 05/11/2023   INSULIN  CANCELED 07/12/2019   Lab Results  Component Value Date   TSH 2.430 05/11/2023   CBC    Component Value Date/Time   WBC 5.5 05/11/2023 0932   WBC 6.6 11/09/2022 0218   RBC 5.04 05/11/2023 0932   RBC 4.28 11/09/2022 0218   HGB 13.4 05/11/2023 0932   HCT 41.2 05/11/2023 0932   PLT 258 05/11/2023 0932   MCV 82 05/11/2023 0932   MCH 26.6 05/11/2023 0932   MCH 26.2 11/09/2022 0218   MCHC 32.5 05/11/2023 0932   MCHC 32.1 11/09/2022 0218   RDW 14.4 05/11/2023 0932   Iron Studies    Component Value Date/Time   IRON 74 05/15/2019 0750   FERRITIN 137.0 05/15/2019 0750   IRONPCTSAT 23.2 05/15/2019 0750   Lipid Panel     Component Value Date/Time   CHOL 203 (H) 05/11/2023 0932   TRIG 68 05/11/2023 0932   HDL 99 05/11/2023 0932   CHOLHDL 2.1 05/11/2023 0932   CHOLHDL 2.6 11/06/2022 0330   VLDL 10 11/06/2022 0330   LDLCALC 92 05/11/2023 0932   Hepatic Function Panel     Component Value Date/Time   PROT 7.3 05/11/2023 0932   ALBUMIN 4.3 05/11/2023 0932   AST 23 05/11/2023 0932   ALT 20 05/11/2023 0932   ALKPHOS 299 (H) 05/11/2023 0932   BILITOT 0.3 05/11/2023 0932   BILIDIR 0.0 02/12/2023 0857   IBILI 0.2 08/02/2020 1601      Component Value Date/Time   TSH 2.430 05/11/2023 0932   Nutritional Lab Results  Component Value Date    VD25OH 17.1 (L) 05/11/2023   VD25OH 28.19 (L) 01/31/2021   VD25OH 24 (L) 08/02/2020    Attestations:   LILLETTE Vernell Forest, acting as a medical scribe for Natalie Jenkins, DO., have compiled all relevant documentation for today's office visit on behalf of Natalie Jenkins, DO, while in  the presence of [employer], DO.  I have reviewed the above documentation for accuracy and completeness, and I agree with the above. Natalie JINNY [name], D.O.  The 21st Century Cures Act was signed into law in 2016 which includes the topic of electronic health records.  This provides immediate access to information in MyChart.  This includes consultation notes, operative notes, office notes, lab results and pathology reports.  If you have any questions about what you read please let us  know at your next visit so we can discuss your concerns and take corrective action if need be.  We are right here with you.

## 2023-11-12 ENCOUNTER — Other Ambulatory Visit (HOSPITAL_COMMUNITY): Payer: Self-pay

## 2023-11-12 LAB — HEMOGLOBIN A1C
Est. average glucose Bld gHb Est-mCnc: 131 mg/dL
Hgb A1c MFr Bld: 6.2 % — ABNORMAL HIGH (ref 4.8–5.6)

## 2023-11-12 LAB — VITAMIN D 25 HYDROXY (VIT D DEFICIENCY, FRACTURES): Vit D, 25-Hydroxy: 24.9 ng/mL — ABNORMAL LOW (ref 30.0–100.0)

## 2023-11-12 MED ORDER — INDAPAMIDE 1.25 MG PO TABS
1.2500 mg | ORAL_TABLET | Freq: Every day | ORAL | 2 refills | Status: AC
  Filled 2023-11-12: qty 90, 90d supply, fill #0

## 2023-11-16 ENCOUNTER — Other Ambulatory Visit (HOSPITAL_COMMUNITY): Payer: Self-pay

## 2023-12-02 ENCOUNTER — Other Ambulatory Visit (HOSPITAL_BASED_OUTPATIENT_CLINIC_OR_DEPARTMENT_OTHER): Payer: Self-pay

## 2023-12-02 ENCOUNTER — Ambulatory Visit (INDEPENDENT_AMBULATORY_CARE_PROVIDER_SITE_OTHER): Admitting: Family Medicine

## 2023-12-02 ENCOUNTER — Telehealth (HOSPITAL_COMMUNITY): Payer: Self-pay

## 2023-12-02 ENCOUNTER — Other Ambulatory Visit (HOSPITAL_COMMUNITY): Payer: Self-pay

## 2023-12-02 ENCOUNTER — Encounter (INDEPENDENT_AMBULATORY_CARE_PROVIDER_SITE_OTHER): Payer: Self-pay | Admitting: Family Medicine

## 2023-12-02 VITALS — BP 104/67 | HR 72 | Temp 98.3°F | Ht 61.0 in | Wt 248.0 lb

## 2023-12-02 DIAGNOSIS — E1159 Type 2 diabetes mellitus with other circulatory complications: Secondary | ICD-10-CM

## 2023-12-02 DIAGNOSIS — I152 Hypertension secondary to endocrine disorders: Secondary | ICD-10-CM

## 2023-12-02 DIAGNOSIS — E559 Vitamin D deficiency, unspecified: Secondary | ICD-10-CM | POA: Diagnosis not present

## 2023-12-02 DIAGNOSIS — E1169 Type 2 diabetes mellitus with other specified complication: Secondary | ICD-10-CM | POA: Diagnosis not present

## 2023-12-02 DIAGNOSIS — Z6841 Body Mass Index (BMI) 40.0 and over, adult: Secondary | ICD-10-CM

## 2023-12-02 DIAGNOSIS — Z7985 Long-term (current) use of injectable non-insulin antidiabetic drugs: Secondary | ICD-10-CM

## 2023-12-02 MED ORDER — TIRZEPATIDE 2.5 MG/0.5ML ~~LOC~~ SOAJ
2.5000 mg | SUBCUTANEOUS | 0 refills | Status: DC
  Filled 2023-12-02 – 2023-12-03 (×2): qty 2, 28d supply, fill #0

## 2023-12-02 MED ORDER — VITAMIN D (ERGOCALCIFEROL) 1.25 MG (50000 UNIT) PO CAPS
50000.0000 [IU] | ORAL_CAPSULE | ORAL | 0 refills | Status: DC
Start: 2023-12-02 — End: 2023-12-30
  Filled 2023-12-02: qty 8, fill #0
  Filled 2023-12-06: qty 8, 28d supply, fill #0

## 2023-12-02 NOTE — Progress Notes (Incomplete)
 [name], D.O.  ABFM, ABOM Specializing in Clinical Bariatric Medicine  Office located at: [address] [place], [place]  [address]     Assessment and Plan:   Medications Discontinued During This Encounter  Medication Reason   tiZANidine  (ZANAFLEX ) 2 MG tablet    acetaminophen  (TYLENOL ) 500 MG tablet    meclizine  (ANTIVERT ) 25 MG tablet Patient Preference   Vitamin D , Ergocalciferol , (DRISDOL ) 1.25 MG (50000 UNIT) CAPS capsule Reorder    Meds ordered this encounter  Medications   Vitamin D , Ergocalciferol , (DRISDOL ) 1.25 MG (50000 UNIT) CAPS capsule    Sig: Take 1 capsule (50,000 Units total) by mouth 2 (two) times a week.    Dispense:  8 capsule    Refill:  0   tirzepatide  (MOUNJARO ) 2.5 MG/0.5ML Pen    Sig: Inject 2.5 mg into the skin once a week.    Dispense:  2 mL    Refill:  0      FOR THE DISEASE OF OBESITY:  BMI 45.0-49.9, adult (HCC) Morbid obesity starting 45.18 Assessment & Plan: Since last office visit on 11/11/23 patient's muscle mass has increased by 0.6 lbs. Fat mass has increased by 0.2 lbs. Total body water has increased by 1.2 lbs.  Body fat % has decreased by 0.1%. Counseling done on how various foods will affect these numbers and how to maximize success  Total lbs lost to date: +9 lbs Total weight loss percentage to date: +3.77 %   Recommended Dietary Goals [name] is currently in the action stage of change. As such, her goal is to continue weight management plan.  She has agreed to: continue current plan   Behavioral Intervention We discussed the following today: increasing lean protein intake to established goals, decreasing simple carbohydrates , avoiding skipping meals, and decreasing eating out or consumption of processed foods, and making healthy choices when eating convenient foods, continue measuring her protein at lunch when meal prepping, continue meal prepping and planning, protein sources and other options aside from  chicken and [place], informed pt of meal prepping stores/businesses ([employer]), increase vegetables (non-starchy veggies or leafy greens).    Additional resources provided today: Handout on Common Characteristics of Successful Weight Losers and Maintainers   Evidence-based interventions for health behavior change were utilized today including the discussion of self monitoring techniques, problem-solving barriers and SMART goal setting techniques.   Regarding patient's less desirable eating habits and patterns, we employed the technique of small changes.   Pt will specifically work on: prioritize eating all her protein at each meal    Recommended Physical Activity Goals Marlette has been advised to work up to 300-450 minutes of moderate intensity aerobic activity a week and strengthening exercises 2-3 times per week for cardiovascular health, weight loss maintenance and preservation of muscle mass.   She has agreed to: Continue current level of physical activity  and Increase physical activity in their day and reduce sedentary time (increase NEAT).   Pharmacotherapy Pt has had minimal weight loss despite her best efforts with following her prudent nutritional meal plan, regularly exercising, and other lifestyle interventions. She is interested in starting Mounjaro  today. She denies any personal hx of medullary thryoid cancer or a fmhx of MEN type 2. Pt does have a hx of pancreatitis, which she states was secondary to gallstones, and has not had any flare ups other than when she had acute cholecystitis. Pt desires to try the GLP1/GIP medicine and is well aware of her increased risk  for pancreatitis with it's use and esp with her history.  Of note, pt reports hx of gastric sleeve   Shared decision making: after extensive counseling, we mutually agreed to INITIATE Mounjaro  2.5 mg once weekly. Risks/benefits reviewed with pt. Work on following her nutritional meal plan to avoid potential GI upset or  other side effects. Pt verbalizes understanding/agreement.    ASSOCIATED CONDITIONS ADDRESSED TODAY: Type 2 diabetes mellitus with obesity Assessment & Plan: Lab Results  Component Value Date   HGBA1C 6.2 (H) 11/11/2023   HGBA1C 5.7 08/13/2023   HGBA1C 6.1 (H) 05/11/2023   INSULIN  8.9 05/11/2023   INSULIN  CANCELED 07/12/2019    No meds currently. Diet and lifestyle intervention approach. Reviewed prior labs: A1C is worsening and is the highest it is been in 2 years. Pt was previously on Metformin ; discontinued due to GI upset. Was also on Ozempic  for 2 yrs w/o GI upset or constipation. She states she stopped Ozempic  due to her A1c being well controlled. Shared decision making: after extensive counseling, we mutually agreed to INITIATE Mounjaro  2.5 mg once weekly.  Risks/benefits reviewed with pt.  Work on following her prudent nutritional meal plan to avoid potential GI upset or other side effects. Pt verbalizes understanding/agreement.   Focus on increasing protein intake to improve glycemic control and promote wt loss. Decrease simple carbs and processed foods. Will continue monitoring condition alongside PCP.      Hypertension associated with type 2 diabetes mellitus [facility]) Assessment & Plan: BP Readings from Last 3 Encounters:  12/02/23 104/67  11/11/23 116/73  10/26/23 117/78   BP is in low normal range today. Currently on Amlodipine  2.5 mg once daily. Good compliance/tolerance. No adverse SE reported. Not monitoring BP at home. Denies any hypotensive/hypertensive symptoms today.   Reviewed BP goal of 120/80 or less; advised that BP <100/60 is considered lower than desired. Recommended BP monitoring at home 2-3x/week at random. If feeling poorly, check BP and sugars. Continue antihypertensive regimen as prescribed. Continue working on following a heart-healthy nutritional meal plan and regular exercise.     Vitamin D  deficiency Assessment & Plan: Lab Results  Component Value  Date   VD25OH 24.9 (L) 11/11/2023   VD25OH 17.1 (L) 05/11/2023   VD25OH 28.19 (L) 01/31/2021   Reviewed last obtained labs: Vit D has improved but still not at goal. Currently on ERGO 50K units once weekly. Good compliance/tolerance. No SE.   Ideal vit D range of 50-70 reviewed w pt. Mutually agreed to INCREASE ERGO to twice weekly. Properly hydrate by drinking 1/2 her body wt in ounces of water per day. Recheck vit D in 3-4 months.      Follow up:   Return in about 4 weeks (around 12/30/2023) for f/u on 12/30/2023 at 9:00 AM. She was informed of the importance of frequent follow up visits to maximize her success with intensive lifestyle modifications for her multiple health conditions.  Subjective:   Chief complaint: Obesity Kemiyah is here to discuss her progress with her obesity treatment plan. She is on keeping a food journal and adhering to recommended goals of 1100-1200 calories and 80+ g of protein daily using the CAT 1 MP with B & L options as a guide and states she is following her eating plan approximately 75% of the time. She states she is walking  30 minutes 5 days per week.   Interval History:  KERITH SHERLEY is here for a follow up office visit. Since last OV on 11/11/2023 ,  she is up 1 lb. She has been trying to increase physical activity whenever she has free time at work. She is eating more whole foods, less processed foods. She skipped lunch today, but typically meal preps for lunch. She usually eats salad with either [place] or ham OR a sandwich with 4-5 ounces ham/turkey, lettuce, and tomatoes. Overall, she states she has struggled with meeting her recommended protein intake on a daily basis. She feels like the food she has to eat is too large of a portion got her -- too much  and feels overstuffed. She is not Not measuring or reaching her protein or water intake goal. Pt endorses GI upset after eating [employer], stating the food didn't agree with her.    Pharmacotherapy  that aid with weight loss: She is currently taking no anti-obesity medication.    Review of Systems:  Pertinent positives were addressed with patient today.  Reviewed by clinician on day of visit: allergies, medications, problem list, medical history, surgical history, family history, social history, and previous encounter notes.  Weight Summary and Biometrics   Weight Lost Since Last Visit: 0  Weight Gained Since Last Visit: 1 lb    Vitals Temp: 98.3 F (36.8 C) BP: 104/67 Pulse Rate: 72 SpO2: 99 %   Anthropometric Measurements Height: 5' 1 (1.549 m) Weight: 248 lb (112.5 kg) BMI (Calculated): 46.88 Weight at Last Visit: 247 lb Weight Lost Since Last Visit: 0 Weight Gained Since Last Visit: 1 lb Starting Weight: 239 lb Total Weight Loss (lbs): 0 lb (0 kg)   Body Composition  Body Fat %: 50.9 % Fat Mass (lbs): 126.4 lbs Muscle Mass (lbs): 115.8 lbs Total Body Water (lbs): 88.8 lbs Visceral Fat Rating : 18   Other Clinical Data Fasting: no Labs: no Today's Visit #: 8 Starting Date: 05/11/23    Objective:   PHYSICAL EXAM: Blood pressure 104/67, pulse 72, temperature 98.3 F (36.8 C), height 5' 1 (1.549 m), weight 248 lb (112.5 kg), SpO2 99%. Body mass index is 46.86 kg/m.  General: she is overweight, cooperative and in no acute distress. PSYCH: Has normal mood, affect and thought process.   HEENT: EOMI, sclerae are anicteric. Lungs: Normal breathing effort, no conversational dyspnea. Extremities: Moves * 4 Neurologic: A and O * 3, good insight  DIAGNOSTIC DATA REVIEWED: BMET    Component Value Date/Time   NA 142 05/11/2023 0932   K 4.0 05/11/2023 0932   CL 101 05/11/2023 0932   CO2 27 05/11/2023 0932   GLUCOSE 90 05/11/2023 0932   GLUCOSE 88 02/12/2023 0857   BUN 20 05/11/2023 0932   CREATININE 1.08 (H) 05/11/2023 0932   CREATININE 1.10 (H) 08/02/2020 1601   CALCIUM  9.8 05/11/2023 0932   GFRNONAA >60 11/10/2022 0026   GFRNONAA 55 (L)  11/29/2019 0905   GFRAA 64 11/29/2019 0905   Lab Results  Component Value Date   HGBA1C 6.2 (H) 11/11/2023   HGBA1C  11/02/2006    6.0 (NOTE)   The ADA recommends the following therapeutic goals for glycemic   control related to Hgb A1C measurement:   Goal of Therapy:   < 7.0% Hgb A1C   Action Suggested:  > 8.0% Hgb A1C   Ref:  Diabetes Care, 22, Suppl. 1, 1999   Lab Results  Component Value Date   INSULIN  8.9 05/11/2023   INSULIN  CANCELED 07/12/2019   Lab Results  Component Value Date   TSH 2.430 05/11/2023   CBC    Component Value Date/Time  WBC 5.5 05/11/2023 0932   WBC 6.6 11/09/2022 0218   RBC 5.04 05/11/2023 0932   RBC 4.28 11/09/2022 0218   HGB 13.4 05/11/2023 0932   HCT 41.2 05/11/2023 0932   PLT 258 05/11/2023 0932   MCV 82 05/11/2023 0932   MCH 26.6 05/11/2023 0932   MCH 26.2 11/09/2022 0218   MCHC 32.5 05/11/2023 0932   MCHC 32.1 11/09/2022 0218   RDW 14.4 05/11/2023 0932   Iron Studies    Component Value Date/Time   IRON 74 05/15/2019 0750   FERRITIN 137.0 05/15/2019 0750   IRONPCTSAT 23.2 05/15/2019 0750   Lipid Panel     Component Value Date/Time   CHOL 203 (H) 05/11/2023 0932   TRIG 68 05/11/2023 0932   HDL 99 05/11/2023 0932   CHOLHDL 2.1 05/11/2023 0932   CHOLHDL 2.6 11/06/2022 0330   VLDL 10 11/06/2022 0330   LDLCALC 92 05/11/2023 0932   Hepatic Function Panel     Component Value Date/Time   PROT 7.3 05/11/2023 0932   ALBUMIN 4.3 05/11/2023 0932   AST 23 05/11/2023 0932   ALT 20 05/11/2023 0932   ALKPHOS 299 (H) 05/11/2023 0932   BILITOT 0.3 05/11/2023 0932   BILIDIR 0.0 02/12/2023 0857   IBILI 0.2 08/02/2020 1601      Component Value Date/Time   TSH 2.430 05/11/2023 0932   Nutritional Lab Results  Component Value Date   VD25OH 24.9 (L) 11/11/2023   VD25OH 17.1 (L) 05/11/2023   VD25OH 28.19 (L) 01/31/2021    Attestations:   LILLETTE Vernell Forest, acting as a medical scribe for Natalie Jenkins, DO., have compiled all  relevant documentation for today's office visit on behalf of Natalie Jenkins, DO, while in the presence of [employer], DO.  I have reviewed the above documentation for accuracy and completeness, and I agree with the above. Natalie JINNY [name], D.O.  The 21st Century Cures Act was signed into law in 2016 which includes the topic of electronic health records.  This provides immediate access to information in MyChart.  This includes consultation notes, operative notes, office notes, lab results and pathology reports.  If you have any questions about what you read please let us  know at your next visit so we can discuss your concerns and take corrective action if need be.  We are right here with you.

## 2023-12-03 ENCOUNTER — Other Ambulatory Visit (HOSPITAL_COMMUNITY): Payer: Self-pay

## 2023-12-03 ENCOUNTER — Telehealth (HOSPITAL_COMMUNITY): Payer: Self-pay

## 2023-12-03 NOTE — Telephone Encounter (Signed)
 Pharmacy Patient Advocate Encounter   Received notification from Pt Calls Messages that prior authorization for Mounjaro  2.5MG /0.5ML auto-injectors  is required/requested.   Insurance verification completed.   The patient is insured through [facility] .   Per test claim: PA required; PA submitted to above mentioned insurance via Latent Key/confirmation #/EOC [id number] Status is pending

## 2023-12-03 NOTE — Telephone Encounter (Signed)
 PA request has been Received. New Encounter has been or will be created for follow up. For additional info see Pharmacy Prior Auth telephone encounter from 12/03/23.

## 2023-12-06 ENCOUNTER — Other Ambulatory Visit (HOSPITAL_COMMUNITY): Payer: Self-pay

## 2023-12-06 NOTE — Telephone Encounter (Signed)
 Pharmacy Patient Advocate Encounter  Received notification from [facility] that Prior Authorization for Mounjaro  2.5MG /0.5ML auto-injectors  has been APPROVED from 12/03/23 to 12/02/24. Ran test claim, Copay is $25. This test claim was processed through [facility]- copay amounts may vary at other pharmacies due to pharmacy/plan contracts, or as the patient moves through the different stages of their insurance plan.   PA #/Case ID/Reference #: [id number]

## 2023-12-30 ENCOUNTER — Encounter (INDEPENDENT_AMBULATORY_CARE_PROVIDER_SITE_OTHER): Payer: Self-pay | Admitting: Family Medicine

## 2023-12-30 ENCOUNTER — Other Ambulatory Visit (HOSPITAL_COMMUNITY): Payer: Self-pay

## 2023-12-30 ENCOUNTER — Ambulatory Visit (INDEPENDENT_AMBULATORY_CARE_PROVIDER_SITE_OTHER): Admitting: Family Medicine

## 2023-12-30 VITALS — BP 113/77 | HR 72 | Temp 97.9°F | Ht 61.0 in | Wt 241.0 lb

## 2023-12-30 DIAGNOSIS — Z9884 Bariatric surgery status: Secondary | ICD-10-CM

## 2023-12-30 DIAGNOSIS — Z6841 Body Mass Index (BMI) 40.0 and over, adult: Secondary | ICD-10-CM | POA: Diagnosis not present

## 2023-12-30 DIAGNOSIS — Z7985 Long-term (current) use of injectable non-insulin antidiabetic drugs: Secondary | ICD-10-CM

## 2023-12-30 DIAGNOSIS — E559 Vitamin D deficiency, unspecified: Secondary | ICD-10-CM | POA: Diagnosis not present

## 2023-12-30 DIAGNOSIS — E1169 Type 2 diabetes mellitus with other specified complication: Secondary | ICD-10-CM | POA: Diagnosis not present

## 2023-12-30 MED ORDER — VITAMIN D (ERGOCALCIFEROL) 1.25 MG (50000 UNIT) PO CAPS
50000.0000 [IU] | ORAL_CAPSULE | ORAL | 0 refills | Status: DC
  Filled 2023-12-30: qty 8, 28d supply, fill #0

## 2023-12-30 MED ORDER — TIRZEPATIDE 5 MG/0.5ML ~~LOC~~ SOAJ
5.0000 mg | SUBCUTANEOUS | 0 refills | Status: DC
  Filled 2023-12-30: qty 2, 28d supply, fill #0

## 2023-12-30 NOTE — Progress Notes (Signed)
 [name], D.O.  ABFM, ABOM Specializing in Clinical Bariatric Medicine  Office located at: [address] [place], [place]  [address]   Assessment and Plan:   Medications Discontinued During This Encounter  Medication Reason   tirzepatide  (MOUNJARO ) 2.5 MG/0.5ML Pen Dose change   Vitamin D , Ergocalciferol , (DRISDOL ) 1.25 MG (50000 UNIT) CAPS capsule Reorder     Meds ordered this encounter  Medications   tirzepatide  (MOUNJARO ) 5 MG/0.5ML Pen    Sig: Inject 5 mg into the skin once a week.    Dispense:  2 mL    Refill:  0   Vitamin D , Ergocalciferol , (DRISDOL ) 1.25 MG (50000 UNIT) CAPS capsule    Sig: Take 1 capsule (50,000 Units total) by mouth 2 (two) times a week.    Dispense:  8 capsule    Refill:  0      FOR THE DISEASE OF OBESITY:  BMI 45.0-49.9, adult (HCC) Morbid obesity starting 45.18 Assessment & Plan: Since last office visit on 12/02/23 patient's muscle mass has increased by 3.6 lbs. Fat mass has decreased by 10.4 lbs. Total body water has decreased by 1.6 lbs.  Body fat % has decreased by 2.9 %. Counseling done on how various foods will affect these numbers and how to maximize success  Total lbs lost to date: +2  lbs Total weight loss percentage to date: + 0.84 %  - Tracking Calories/Macros: yes  - Eating More Whole Foods: yes  - Adequate Protein Intake: yes  - Adequate Water Intake: no - feels full faster   - Skipping Meals: no   - Sleeping 7-9 Hours/ Night: yes   Recommended Dietary Goals [name] is currently in the action stage of change. As such, her goal is to continue weight management plan.  She has agreed to: keeping a food journal and adhering to recommended goals of 1100-1200 calories and 80+ g of protein daily using the CAT 1 MP with B & L options as a guide     Behavioral Intervention We discussed the following today: increasing water intake , continue to work on maintaining a reduced calorie state, getting the recommended  amount of protein, incorporating whole foods, making healthy choices, staying well hydrated and practicing mindfulness when eating., and focusing on food with a 10:1 ratio of calories: grams of protein  Additional resources provided today: None  Evidence-based interventions for health behavior change were utilized today including the discussion of self monitoring techniques, problem-solving barriers and SMART goal setting techniques.   Regarding patient's less desirable eating habits and patterns, we employed the technique of small changes.   Goal(s) for next OV: Drink 4 bottles of water a day    Recommended Physical Activity Goals [name] has been advised to work up to 300-450 minutes of moderate intensity aerobic activity a week and strengthening exercises 2-3 times per week for cardiovascular health, weight loss maintenance and preservation of muscle mass.   She has agreed to: Think about enjoyable ways to increase daily physical activity and overcoming barriers to exercise, Increase physical activity in their day and reduce sedentary time (increase NEAT)., and Increase volume of physical activity to a goal of 240 minutes a week   Pharmacotherapy We both agreed to: Continue with current nutritional and behavioral strategies and increase Mounjaro  5 mg weekly.    ASSOCIATED CONDITIONS ADDRESSED TODAY:  Type 2 diabetes mellitus with obesity Assessment & Plan Lab Results  Component Value Date   HGBA1C 6.2 (H) 11/11/2023  HGBA1C 5.7 08/13/2023   HGBA1C 6.1 (H) 05/11/2023   INSULIN  8.9 05/11/2023   INSULIN  CANCELED 07/12/2019    Started Mounjaro  2.5 mg last OV. Good compliance and tolerance. Pt has no acute concerns. Denies any GI upsets or constipation. Endorses having a decrease in cravings specifically [employer] and chocolate bars but still has some cravings. Due to still having craving mutually agreed to Increase Mounjaro  to 5 mg weekly. Educated on risks/benefits. Pt verbalizes  agreement. Recommended pt to increase water intake (half of weight in oz of water) Continue taking and follow meal plan decreasing simple carbs, increasing proteins.    S/P laparoscopic sleeve gastrectomy - done on May 1st, 2018 by Dr.Kinsinger Assessment & Plan Pt states that she struggles to drink her water since she gets fuller faster. She has been focusing on eating her protein before eating veggies or other foods. She feels like it is difficult to get beyond 2 bottles of water a day. Discussed stratgies to eating smaller meals throughout the day and keeping water near her to be able to drink throught out the day Continue following prudent meal plan.    Vitamin D  deficiency Assessment & Plan Lab Results  Component Value Date   VD25OH 24.9 (L) 11/11/2023   VD25OH 17.1 (L) 05/11/2023   VD25OH 28.19 (L) 01/31/2021   Taking ERGO 50K units twice weekly. Good compliance and tolerance. Pt declines having any GI upset on this dosage. Continue taking and will reevaluate in the future.   Follow up:   Return 01/27/2024 at 2:40 PM  She was informed of the importance of frequent follow up visits to maximize her success with intensive lifestyle modifications for her multiple health conditions.    Subjective:    Chief complaint: Obesity [name] is here to discuss her progress with her obesity treatment plan. She is keeping a food journal and adhering to recommended goals of 1100-1200 calories and 80+ g of protein daily using the CAT 1 MP with B & L options as a guide   and states she is following her eating plan approximately 95 % of the time. Pt is walking  30  minutes 5 days per week    Interval History:  [name] is here for a follow up office visit. Pt has experienced a weight loss of 7 lbs since last OV on 12/02/23. Pt states that drinking a 20 oz water bottle takes her all day to finish. She reports doing better from last tome and has weighed her food and made sure she has gotten  as much protein in as she can. She has eaten more salads and yogurt with apples. She feels like she has a lot more energy. When eating off plan like this morning with a blueberry muffin she states that she feels good.       Pharmacotherapy that aid with weight loss: She is currently taking Mounjaro  5 mg weekly.    Review of Systems:  Pertinent positives were addressed with patient today.  Reviewed by clinician on day of visit: allergies, medications, problem list, medical history, surgical history, family history, social history, and previous encounter notes.  Weight Summary and Biometrics   Weight Lost Since Last Visit: 7 lb  Weight Gained Since Last Visit: 0    Vitals Temp: 97.9 F (36.6 C) BP: 113/77 Pulse Rate: 72 SpO2: 100 %   Anthropometric Measurements Height: 5' 1 (1.549 m) Weight: 241 lb (109.3 kg) BMI (Calculated): 45.56 Weight at Last Visit: 248 lb  Weight Lost Since Last Visit: 7 lb Weight Gained Since Last Visit: 0 Starting Weight: 239 lb Total Weight Loss (lbs): 0 lb (0 kg) Peak Weight: 305 lb   Body Composition  Body Fat %: 48 % Fat Mass (lbs): 116 lbs Muscle Mass (lbs): 119.4 lbs Total Body Water (lbs): 87.2 lbs Visceral Fat Rating : 17   Other Clinical Data Fasting: no Labs: no Today's Visit #: 9 Starting Date: 05/11/23    Objective:   PHYSICAL EXAM: Blood pressure 113/77, pulse 72, temperature 97.9 F (36.6 C), height 5' 1 (1.549 m), weight 241 lb (109.3 kg), SpO2 100%. Body mass index is 45.54 kg/m.  General: she is overweight, cooperative and in no acute distress. PSYCH: Has normal mood, affect and thought process.   HEENT: EOMI, sclerae are anicteric. Lungs: Normal breathing effort, no conversational dyspnea. Extremities: Moves * 4 Neurologic: A and O * 3, good insight  DIAGNOSTIC DATA REVIEWED: BMET    Component Value Date/Time   NA 142 05/11/2023 0932   K 4.0 05/11/2023 0932   CL 101 05/11/2023 0932   CO2 27  05/11/2023 0932   GLUCOSE 90 05/11/2023 0932   GLUCOSE 88 02/12/2023 0857   BUN 20 05/11/2023 0932   CREATININE 1.08 (H) 05/11/2023 0932   CREATININE 1.10 (H) 08/02/2020 1601   CALCIUM  9.8 05/11/2023 0932   GFRNONAA >60 11/10/2022 0026   GFRNONAA 55 (L) 11/29/2019 0905   GFRAA 64 11/29/2019 0905   Lab Results  Component Value Date   HGBA1C 6.2 (H) 11/11/2023   HGBA1C  11/02/2006    6.0 (NOTE)   The ADA recommends the following therapeutic goals for glycemic   control related to Hgb A1C measurement:   Goal of Therapy:   < 7.0% Hgb A1C   Action Suggested:  > 8.0% Hgb A1C   Ref:  Diabetes Care, 22, Suppl. 1, 1999   Lab Results  Component Value Date   INSULIN  8.9 05/11/2023   INSULIN  CANCELED 07/12/2019   Lab Results  Component Value Date   TSH 2.430 05/11/2023   CBC    Component Value Date/Time   WBC 5.5 05/11/2023 0932   WBC 6.6 11/09/2022 0218   RBC 5.04 05/11/2023 0932   RBC 4.28 11/09/2022 0218   HGB 13.4 05/11/2023 0932   HCT 41.2 05/11/2023 0932   PLT 258 05/11/2023 0932   MCV 82 05/11/2023 0932   MCH 26.6 05/11/2023 0932   MCH 26.2 11/09/2022 0218   MCHC 32.5 05/11/2023 0932   MCHC 32.1 11/09/2022 0218   RDW 14.4 05/11/2023 0932   Iron Studies    Component Value Date/Time   IRON 74 05/15/2019 0750   FERRITIN 137.0 05/15/2019 0750   IRONPCTSAT 23.2 05/15/2019 0750   Lipid Panel     Component Value Date/Time   CHOL 203 (H) 05/11/2023 0932   TRIG 68 05/11/2023 0932   HDL 99 05/11/2023 0932   CHOLHDL 2.1 05/11/2023 0932   CHOLHDL 2.6 11/06/2022 0330   VLDL 10 11/06/2022 0330   LDLCALC 92 05/11/2023 0932   Hepatic Function Panel     Component Value Date/Time   PROT 7.3 05/11/2023 0932   ALBUMIN 4.3 05/11/2023 0932   AST 23 05/11/2023 0932   ALT 20 05/11/2023 0932   ALKPHOS 299 (H) 05/11/2023 0932   BILITOT 0.3 05/11/2023 0932   BILIDIR 0.0 02/12/2023 0857   IBILI 0.2 08/02/2020 1601      Component Value Date/Time   TSH 2.430 05/11/2023 0932  Nutritional Lab Results  Component Value Date   VD25OH 24.9 (L) 11/11/2023   VD25OH 17.1 (L) 05/11/2023   VD25OH 28.19 (L) 01/31/2021    Attestations:   LILLETTE Sonny Laroche, acting as a medical scribe for Natalie Jenkins, DO., have compiled all relevant documentation for today's office visit on behalf of Natalie Jenkins, DO, while in the presence of [employer], DO.  I have reviewed the above documentation for accuracy and completeness, and I agree with the above. [name], D.O.  The 21st Century Cures Act was signed into law in 2016 which includes the topic of electronic health records.  This provides immediate access to information in MyChart.  This includes consultation notes, operative notes, office notes, lab results and pathology reports.  If you have any questions about what you read please let us  know at your next visit so we can discuss your concerns and take corrective action if need be.  We are right here with you.

## 2024-01-21 ENCOUNTER — Other Ambulatory Visit (HOSPITAL_COMMUNITY): Payer: Self-pay

## 2024-01-25 ENCOUNTER — Other Ambulatory Visit (HOSPITAL_COMMUNITY): Payer: Self-pay

## 2024-01-27 ENCOUNTER — Ambulatory Visit (INDEPENDENT_AMBULATORY_CARE_PROVIDER_SITE_OTHER): Admitting: Family Medicine

## 2024-01-27 ENCOUNTER — Other Ambulatory Visit (HOSPITAL_COMMUNITY): Payer: Self-pay

## 2024-01-27 ENCOUNTER — Encounter (INDEPENDENT_AMBULATORY_CARE_PROVIDER_SITE_OTHER): Payer: Self-pay | Admitting: Family Medicine

## 2024-01-27 DIAGNOSIS — E559 Vitamin D deficiency, unspecified: Secondary | ICD-10-CM

## 2024-01-27 DIAGNOSIS — T383X5A Adverse effect of insulin and oral hypoglycemic [antidiabetic] drugs, initial encounter: Secondary | ICD-10-CM | POA: Diagnosis not present

## 2024-01-27 DIAGNOSIS — E1169 Type 2 diabetes mellitus with other specified complication: Secondary | ICD-10-CM

## 2024-01-27 DIAGNOSIS — K5903 Drug induced constipation: Secondary | ICD-10-CM

## 2024-01-27 DIAGNOSIS — Z6841 Body Mass Index (BMI) 40.0 and over, adult: Secondary | ICD-10-CM

## 2024-01-27 DIAGNOSIS — Z7985 Long-term (current) use of injectable non-insulin antidiabetic drugs: Secondary | ICD-10-CM

## 2024-01-27 DIAGNOSIS — H8110 Benign paroxysmal vertigo, unspecified ear: Secondary | ICD-10-CM

## 2024-01-27 MED ORDER — TIRZEPATIDE 7.5 MG/0.5ML ~~LOC~~ SOAJ
7.5000 mg | SUBCUTANEOUS | 0 refills | Status: DC
  Filled 2024-01-27: qty 2, 28d supply, fill #0

## 2024-01-27 MED ORDER — VITAMIN D (ERGOCALCIFEROL) 1.25 MG (50000 UNIT) PO CAPS
50000.0000 [IU] | ORAL_CAPSULE | ORAL | 0 refills | Status: DC
  Filled 2024-01-27: qty 8, 28d supply, fill #0

## 2024-01-27 MED ORDER — POLYETHYLENE GLYCOL 3350 17 G PO PACK: 17.0000 g | PACK | Freq: Two times a day (BID) | ORAL | Status: AC

## 2024-01-27 NOTE — Progress Notes (Signed)
 [name], D.O.  ABFM, ABOM Specializing in Clinical Bariatric Medicine  Office located at: [address] [place], [place]  [address]      A) FOR THE CHRONIC DISEASE OF OBESITY:  Chief complaint: Obesity [name] is here to discuss her progress with her obesity treatment plan.   History of present illness / Interval history:  [name] is here today for her follow-up office visit.  Since last OV on 12/30/23, pt is down 9 lbs. Pt states that she has been doing good. She endorses getting about 6 oz of protein. She had a bad episode of vertigo yesterday.      12/30/23 15:00 01/27/24 14:00   Body Fat % 48 % 48.4 %  Muscle Mass (lbs) 119.4 lbs 114 lbs  Fat Mass (lbs) 116 lbs 112.4 lbs  Total Body Water (lbs) 87.2 lbs 83 lbs  Visceral Fat Rating  17 16    Counseling done on how various foods will affect these numbers and how to maximize success   Total lbs lost to date: - 7 lbs Total Fat Mass in lbs lost to date: - 1.8 lbs  Total weight loss percentage to date: - 2.93 %    Morbid obesity starting 45.18 BMI 40.0-44.9, adult (HCC) - Current BMI 43.86 Nutrition Therapy She is keeping a food journal and adhering to recommended goals of 1100-1200 calories and 80+ g of protein daily using the CAT 1 MP with B & L options as a guide and states she is following her eating plan approximately 95 % of the time.   - Tracking Calories/Macros: no   - Eating More Whole Foods: yes  - Adequate Protein Intake: yes  - Adequate Water Intake: no   - Skipping Meals: yes  - Sleeping 7-9 Hours/ Night: yes   [name] is currently in the action stage of change. As such, her goal is to continue weight management plan.  She has agreed to: continue current plan   Physical Activity [name] is walking for 30  minutes 5 days per week   [name] has been advised to work up to 300-450 minutes of moderate intensity aerobic activity a week and strengthening exercises 2-3 times per  week for cardiovascular health, weight loss maintenance and preservation of muscle mass.  She has agreed to : Think about enjoyable ways to increase daily physical activity and overcoming barriers to exercise, Increase physical activity in their day and reduce sedentary time (increase NEAT)., and Increase volume of physical activity to a goal of 240 minutes a week   Behavioral Modifications Evidence-based interventions for health behavior change were utilized today including the discussion of  1) self monitoring techniques:    - Continue walking regimen  2) problem-solving barriers:    3) self care:    4) SMART goals for next OV:    - Add 2 to 3 days of strength/ resistance training   Regarding patient's less desirable eating habits and patterns, we employed the technique of small changes.   We discussed the following today: increasing water intake , work on meal planning and preparation, and continue to work on maintaining a reduced calorie state, getting the recommended amount of protein, incorporating whole foods, making healthy choices, staying well hydrated and practicing mindfulness when eating., extensive review on eating proper protein  Additional resources provided today: Handout on resistance training exercises using bands or free weights and Handout on benefits of exercise for wt loss  - handout provided of  epley maneuvers   Medical Interventions/ Pharmacotherapy Previous Bariatric surgery: n/a Pharmacotherapy for weight loss: She is currently taking Mounjaro  5 mg weekly for medical weight loss.    We discussed various medication options to help [name] with her weight loss efforts and we both agreed to : Continue with current nutritional and behavioral strategiesand medications    B) OBESITY RELATED CONDITIONS ADDRESSED TODAY:  Type 2 diabetes mellitus with other specified complication, without long-term current use of insulin  (HCC) Drug-induced constipation Assessment &  Plan Lab Results  Component Value Date   HGBA1C 6.2 (H) 11/11/2023   HGBA1C 5.7 08/13/2023   HGBA1C 6.1 (H) 05/11/2023   INSULIN  8.9 05/11/2023   INSULIN  CANCELED 07/12/2019    On Mounjaro  5 mg weekly. Good compliance and tolerance. Pt states that she has good control of cravings but sometimes has increased hunger between meals. She endorses focusing on getting her protein in. She recently made cake for her husband and states that she had no cravings or wants to eat the cake. Due to pt still having excessive hunger mutually agreed to Increase Mounjaro  to 7.5 mg. Explained to pt that due to her history of gall stone pancreatitis she has to make sure she has to make sure she eats healthy foods to avoid GI discomfort especially from gall bladder. Pt agrees and assumes the risk. Pt states that she has constipation. Encouraged pt to start taking Miralax  twice a day to help and increase water intake. Continue following prudent meal plan. Will obtain labs in the near future.     Benign paroxysmal positional vertigo, unspecified laterality Assessment & Plan  Takes Meclizine  prn when vertigo episode happens. Pt states that it does help. Pt reports that she had a bad episode yesterday and she had to leave work. She states that it was a little different than previous ones because the spinning stopped but her eyes started watering. She endorses that she doesn't get episodes often. Explained to pt what cause vertigo. Gave her a handout on Epley maneuvers. Recommended pt to do exercises twice a week on both sides. Will continue monitoring.      Vitamin D  deficiency Assessment & Plan Lab Results  Component Value Date   VD25OH 24.9 (L) 11/11/2023   VD25OH 17.1 (L) 05/11/2023   VD25OH 28.19 (L) 01/31/2021   Taking ERGO 50K units twice weekly. Good compliance and tolerance. Pt states that she has no acute concerns today. Continue supplementation. Will refill today. Will obtain labs at next OV.      Medications Discontinued During This Encounter  Medication Reason   tirzepatide  (MOUNJARO ) 5 MG/0.5ML Pen    Vitamin D , Ergocalciferol , (DRISDOL ) 1.25 MG (50000 UNIT) CAPS capsule Reorder     Meds ordered this encounter  Medications   Vitamin D , Ergocalciferol , (DRISDOL ) 1.25 MG (50000 UNIT) CAPS capsule    Sig: Take 1 capsule (50,000 Units total) by mouth 2 (two) times a week.    Dispense:  8 capsule    Refill:  0   polyethylene glycol (MIRALAX  / GLYCOLAX ) 17 g packet    Sig: Take 17 g by mouth 2 (two) times daily. Until stooling regularly, then once daily and/or PRN   tirzepatide  (MOUNJARO ) 7.5 MG/0.5ML Pen    Sig: Inject 7.5 mg into the skin once a week.    Dispense:  2 mL    Refill:  0      Follow up:   Return 02/24/2024 at 2:40 PM  She was informed of the importance  of frequent follow up visits to maximize her success with intensive lifestyle modifications for her multiple health conditions.   Weight Summary and Biometrics   Weight Lost Since Last Visit: 9lb  Weight Gained Since Last Visit: 0lb    Vitals Temp: 99 F (37.2 C) BP: 116/82 Pulse Rate: 87 SpO2: 98 %   Anthropometric Measurements Height: 5' 1 (1.549 m) Weight: 232 lb (105.2 kg) BMI (Calculated): 43.86 Weight at Last Visit: 241lb Weight Lost Since Last Visit: 9lb Weight Gained Since Last Visit: 0lb Starting Weight: 239lb Total Weight Loss (lbs): 7 lb (3.175 kg) Peak Weight: 305lb   Body Composition  Body Fat %: 48.4 % Fat Mass (lbs): 112.4 lbs Muscle Mass (lbs): 114 lbs Total Body Water (lbs): 83 lbs Visceral Fat Rating : 16   Other Clinical Data Fasting: no Labs: no Today's Visit #: 10 Starting Date: 05/11/23    Objective:   PHYSICAL EXAM: Blood pressure 116/82, pulse 87, temperature 99 F (37.2 C), height 5' 1 (1.549 m), weight 232 lb (105.2 kg), SpO2 98%. Body mass index is 43.84 kg/m.  General: she is overweight, cooperative and in no acute distress. PSYCH: Has  normal mood, affect and thought process.   HEENT: EOMI, sclerae are anicteric. Lungs: Normal breathing effort, no conversational dyspnea. Extremities: Moves * 4 Neurologic: A and O * 3, good insight  DIAGNOSTIC DATA REVIEWED: BMET    Component Value Date/Time   NA 142 05/11/2023 0932   K 4.0 05/11/2023 0932   CL 101 05/11/2023 0932   CO2 27 05/11/2023 0932   GLUCOSE 90 05/11/2023 0932   GLUCOSE 88 02/12/2023 0857   BUN 20 05/11/2023 0932   CREATININE 1.08 (H) 05/11/2023 0932   CREATININE 1.10 (H) 08/02/2020 1601   CALCIUM  9.8 05/11/2023 0932   GFRNONAA >60 11/10/2022 0026   GFRNONAA 55 (L) 11/29/2019 0905   GFRAA 64 11/29/2019 0905   Lab Results  Component Value Date   HGBA1C 6.2 (H) 11/11/2023   HGBA1C  11/02/2006    6.0 (NOTE)   The ADA recommends the following therapeutic goals for glycemic   control related to Hgb A1C measurement:   Goal of Therapy:   < 7.0% Hgb A1C   Action Suggested:  > 8.0% Hgb A1C   Ref:  Diabetes Care, 22, Suppl. 1, 1999   Lab Results  Component Value Date   INSULIN  8.9 05/11/2023   INSULIN  CANCELED 07/12/2019   Lab Results  Component Value Date   TSH 2.430 05/11/2023   CBC    Component Value Date/Time   WBC 5.5 05/11/2023 0932   WBC 6.6 11/09/2022 0218   RBC 5.04 05/11/2023 0932   RBC 4.28 11/09/2022 0218   HGB 13.4 05/11/2023 0932   HCT 41.2 05/11/2023 0932   PLT 258 05/11/2023 0932   MCV 82 05/11/2023 0932   MCH 26.6 05/11/2023 0932   MCH 26.2 11/09/2022 0218   MCHC 32.5 05/11/2023 0932   MCHC 32.1 11/09/2022 0218   RDW 14.4 05/11/2023 0932   Iron Studies    Component Value Date/Time   IRON 74 05/15/2019 0750   FERRITIN 137.0 05/15/2019 0750   IRONPCTSAT 23.2 05/15/2019 0750   Lipid Panel     Component Value Date/Time   CHOL 203 (H) 05/11/2023 0932   TRIG 68 05/11/2023 0932   HDL 99 05/11/2023 0932   CHOLHDL 2.1 05/11/2023 0932   CHOLHDL 2.6 11/06/2022 0330   VLDL 10 11/06/2022 0330   LDLCALC 92 05/11/2023 0932  Hepatic Function Panel     Component Value Date/Time   PROT 7.3 05/11/2023 0932   ALBUMIN 4.3 05/11/2023 0932   AST 23 05/11/2023 0932   ALT 20 05/11/2023 0932   ALKPHOS 299 (H) 05/11/2023 0932   BILITOT 0.3 05/11/2023 0932   BILIDIR 0.0 02/12/2023 0857   IBILI 0.2 08/02/2020 1601      Component Value Date/Time   TSH 2.430 05/11/2023 0932   Nutritional Lab Results  Component Value Date   VD25OH 24.9 (L) 11/11/2023   VD25OH 17.1 (L) 05/11/2023   VD25OH 28.19 (L) 01/31/2021    Attestations:   LILLETTE Sonny Laroche, acting as a medical scribe for Barnie Jenkins, DO., have compiled all relevant documentation for today's office visit on behalf of Barnie Jenkins, DO, while in the presence of [employer], DO.   I have reviewed the above documentation for accuracy and completeness, and I agree with the above. Barnie JINNY Jenkins, D.O.  The 21st Century Cures Act was signed into law in 2016 which includes the topic of electronic health records.  This provides immediate access to information in MyChart.  This includes consultation notes, operative notes, office notes, lab results and pathology reports.  If you have any questions about what you read please let us  know at your next visit so we can discuss your concerns and take corrective action if need be.  We are right here with you.

## 2024-02-14 ENCOUNTER — Ambulatory Visit: Admitting: Family Medicine

## 2024-02-20 NOTE — Progress Notes (Unsigned)
 HPI:  No chief complaint on file.   [name] is a 58 y.o. female, who is here today for chronic disease management. Last seen on ***  Hypertension:  Medications:*** BP readings at home:*** Side effects:***  Negative for unusual or severe headache, visual changes, exertional chest pain, dyspnea,  focal weakness, or edema.  Lab Results  Component Value Date   CREATININE 1.08 (H) 05/11/2023   BUN 20 05/11/2023   NA 142 05/11/2023   K 4.0 05/11/2023   CL 101 05/11/2023   CO2 27 05/11/2023    Diabetes Mellitus II:  - Checking BG at home: *** - Medications: *** - Compliance: *** - Diet: *** - Exercise: *** - eye exam: *** - foot exam: ***  - Negative for symptoms of hypoglycemia, polyuria, polydipsia, numbness extremities, foot ulcers/trauma  Lab Results  Component Value Date   HGBA1C 6.2 (H) 11/11/2023   Lab Results  Component Value Date   MICROALBUR <0.2 08/02/2020    Hyperlipidemia: Currently on *** Following a low fat diet: ***. Side effects from medication:*** Lab Results  Component Value Date   CHOL 203 (H) 05/11/2023   HDL 99 05/11/2023   LDLCALC 92 05/11/2023   TRIG 68 05/11/2023   CHOLHDL 2.1 05/11/2023    Review of Systems See other pertinent positives and negatives in HPI.  Current Outpatient Medications on File Prior to Visit  Medication Sig Dispense Refill   Accu-Chek Softclix Lancets lancets Use to check blood sugar daily. 100 each 12   amLODipine  (NORVASC ) 2.5 MG tablet Take 1 tablet (2.5 mg total) by mouth daily. 90 tablet 3   Blood Glucose Monitoring Suppl (ACCU-CHEK AVIVA PLUS) w/Device KIT As directed. 1 kit 0   ezetimibe  (ZETIA ) 10 MG tablet Take 1 tablet (10 mg total) by mouth daily. 90 tablet 3   glucose blood (ACCU-CHEK GUIDE) test strip Use to test blood sugar daily. 100 each 12   ibuprofen (ADVIL) 600 MG tablet Take 600 mg by mouth every 6 (six) hours as needed for mild pain.     indapamide  (LOZOL ) 1.25 MG tablet  Take 1 tablet (1.25 mg total) by mouth daily. 90 tablet 2   polyethylene glycol (MIRALAX  / GLYCOLAX ) 17 g packet Take 17 g by mouth 2 (two) times daily. Until stooling regularly, then once daily and/or PRN     tirzepatide  (MOUNJARO ) 7.5 MG/0.5ML Pen Inject 7.5 mg into the skin once a week. 2 mL 0   Vitamin D , Ergocalciferol , (DRISDOL ) 1.25 MG (50000 UNIT) CAPS capsule Take 1 capsule (50,000 Units total) by mouth 2 (two) times a week. 8 capsule 0   No current facility-administered medications on file prior to visit.    Past Medical History:  Diagnosis Date   Anemia    Back pain    Back pain    Clotting disorder    Constipation    Diabetes mellitus without complication (HCC)    Edema 04/25/2015   Fatty liver    Gallbladder problem    Headache(784.0)    Herniated disc    High blood pressure    History of colonic polyps    Hypertension    Joint pain    Joint pain    Obesity    Osteoarthritis    Swelling of both lower extremities    TIA (transient ischemic attack) 2007   hx of   Vasculitis    L leg - Polyarthritis   Venous insufficiency    Vitamin D  deficiency  Vitamin D  deficiency     Allergies  Allergen Reactions   Dilaudid  [Hydromorphone ] Itching    Social History   Socioeconomic History   Marital status: Married    Spouse name: [name]   Number of children: Not on file   Years of education: Not on file   Highest education level: Not on file  Occupational History   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  Tobacco Use   Smoking status: Never   Smokeless tobacco: Never  Vaping Use   Vaping status: Never Used  Substance and Sexual Activity   Alcohol use: Yes    Alcohol/week: 0.0 standard drinks of alcohol    Comment: rarely   Drug use: No   Sexual activity: Yes    Birth control/protection: Surgical  Other Topics Concern   Not on file  Social History Narrative   Not on file   Social Drivers of Health   Financial Resource Strain: Not on file  Food  Insecurity: No Food Insecurity (11/11/2022)   Hunger Vital Sign    Worried About Running Out of Food in the Last Year: Never true    Ran Out of Food in the Last Year: Never true  Transportation Needs: No Transportation Needs (11/11/2022)   PRAPARE - Administrator, Civil Service (Medical): No    Lack of Transportation (Non-Medical): No  Physical Activity: Not on file  Stress: Not on file  Social Connections: Unknown (10/12/2022)   Received from [facility]   Social Network    Social Network: Not on file    There were no vitals filed for this visit.  There is no height or weight on file to calculate BMI.  Physical Exam  ASSESSMENT AND PLAN:  There are no diagnoses linked to this encounter.  No orders of the defined types were placed in this encounter.   No problem-specific Assessment & Plan notes found for this encounter.   No follow-ups on file.   Natalie Menger, MD [facility]. [place] office.

## 2024-02-21 ENCOUNTER — Ambulatory Visit: Admitting: Family Medicine

## 2024-02-21 ENCOUNTER — Encounter: Payer: Self-pay | Admitting: Family Medicine

## 2024-02-21 VITALS — BP 108/78 | HR 68 | Temp 97.9°F | Resp 16 | Ht 61.0 in | Wt 231.8 lb

## 2024-02-21 DIAGNOSIS — Z7985 Long-term (current) use of injectable non-insulin antidiabetic drugs: Secondary | ICD-10-CM

## 2024-02-21 DIAGNOSIS — D509 Iron deficiency anemia, unspecified: Secondary | ICD-10-CM | POA: Diagnosis not present

## 2024-02-21 DIAGNOSIS — E1169 Type 2 diabetes mellitus with other specified complication: Secondary | ICD-10-CM | POA: Diagnosis not present

## 2024-02-21 DIAGNOSIS — Z23 Encounter for immunization: Secondary | ICD-10-CM

## 2024-02-21 DIAGNOSIS — I10 Essential (primary) hypertension: Secondary | ICD-10-CM

## 2024-02-21 DIAGNOSIS — R5383 Other fatigue: Secondary | ICD-10-CM | POA: Diagnosis not present

## 2024-02-21 DIAGNOSIS — E785 Hyperlipidemia, unspecified: Secondary | ICD-10-CM

## 2024-02-21 LAB — POCT GLYCOSYLATED HEMOGLOBIN (HGB A1C): Hemoglobin A1C: 5.8 % — AB (ref 4.0–5.6)

## 2024-02-21 NOTE — Assessment & Plan Note (Signed)
 BP adequately controlled. Continue amlodipine  5 mg 1/2 tablet daily, Indipam in 1.25 mg daily, and low-salt diet. Continue monitoring BP regularly. Eye exam is current.

## 2024-02-21 NOTE — Assessment & Plan Note (Signed)
 Problem is well-controlled. Currently she is on Mounjaro  7.5 mg weekly [facility] for [facility]. Annual eye exam, periodic dental and foot care recommended. F/U in 6 months.

## 2024-02-21 NOTE — Assessment & Plan Note (Addendum)
 Last LDL 92 in 04/2023. She has not tolerated statins in the past. Currently she is on Zetia  10 mg daily. Further recommendations according to lipid panel result.

## 2024-02-21 NOTE — Assessment & Plan Note (Signed)
Severe muscle aches with Pravastatin, even when taken 3 times per week.

## 2024-02-21 NOTE — Patient Instructions (Addendum)
 A few things to remember from today's visit:  Type 2 diabetes mellitus with other specified complication, without long-term current use of insulin  (HCC) - Plan: POC HgB A1c, Microalbumin / creatinine urine ratio  Essential hypertension, benign - Plan: Comprehensive metabolic panel with GFR  Hyperlipidemia associated with type 2 diabetes mellitus (HCC) - Plan: Lipid panel  Colon cancer screening  Fatigue, unspecified type - Plan: Comprehensive metabolic panel with GFR, CBC, TSH  Iron deficiency anemia, unspecified iron deficiency anemia type - Plan: Iron, Ferritin  No changes today. Try to schedule your mammogram, colonoscopy,and eye exam.  If you need refills for medications you take chronically, please call your pharmacy. Do not use My Chart to request refills or for acute issues that need immediate attention. If you send a my chart message, it may take a few days to be addressed, specially if I am not in the office.  Please be sure medication list is accurate. If a new problem present, please set up appointment sooner than planned today.

## 2024-02-24 ENCOUNTER — Ambulatory Visit (INDEPENDENT_AMBULATORY_CARE_PROVIDER_SITE_OTHER): Admitting: Internal Medicine

## 2024-02-24 ENCOUNTER — Ambulatory Visit (INDEPENDENT_AMBULATORY_CARE_PROVIDER_SITE_OTHER): Payer: Self-pay | Admitting: Nurse Practitioner

## 2024-02-24 ENCOUNTER — Encounter (INDEPENDENT_AMBULATORY_CARE_PROVIDER_SITE_OTHER): Payer: Self-pay | Admitting: Internal Medicine

## 2024-02-24 ENCOUNTER — Other Ambulatory Visit (HOSPITAL_COMMUNITY): Payer: Self-pay

## 2024-02-24 VITALS — BP 107/72 | HR 76 | Temp 98.2°F | Ht 61.0 in | Wt 227.0 lb

## 2024-02-24 DIAGNOSIS — I10 Essential (primary) hypertension: Secondary | ICD-10-CM

## 2024-02-24 DIAGNOSIS — R5383 Other fatigue: Secondary | ICD-10-CM

## 2024-02-24 DIAGNOSIS — E1169 Type 2 diabetes mellitus with other specified complication: Secondary | ICD-10-CM | POA: Diagnosis not present

## 2024-02-24 DIAGNOSIS — Z6841 Body Mass Index (BMI) 40.0 and over, adult: Secondary | ICD-10-CM

## 2024-02-24 DIAGNOSIS — E66813 Obesity, class 3: Secondary | ICD-10-CM

## 2024-02-24 DIAGNOSIS — Z7985 Long-term (current) use of injectable non-insulin antidiabetic drugs: Secondary | ICD-10-CM

## 2024-02-24 MED ORDER — TIRZEPATIDE 5 MG/0.5ML ~~LOC~~ SOAJ
5.0000 mg | SUBCUTANEOUS | 0 refills | Status: DC
  Filled 2024-02-24: qty 2, 28d supply, fill #0

## 2024-02-24 NOTE — Assessment & Plan Note (Signed)
 Obesity management complicated by sleeve gastrectomy and current use of Mounjaro . Reports significant satiety after minimal food intake, leading to fatigue and inadequate nutrition. Current dose of Mounjaro  (7.5 mg) is too high, causing excessive appetite suppression and potential metabolic slowdown. Muscle loss noted, likely due to inadequate nutrition and lack of exercise. - Decreased Mounjaro  dose to 5 mg weekly. - Encouraged five small meals per day to ensure adequate nutrition. - Recommended physical activity, including strengthening exercises, to preserve muscle mass and improve metabolic rate. - Advised on the importance of hydration, aiming for 90 ounces of water daily.

## 2024-02-24 NOTE — Assessment & Plan Note (Signed)
 Hypertension currently managed with amlodipine  and indapamide . Blood pressure is low normal, likely due to weight loss and medication effects. Indapamide  may no longer be necessary. - Held indapamide  to assess blood pressure response. - Continue amlodipine .
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
## 2024-03-03 ENCOUNTER — Other Ambulatory Visit

## 2024-03-03 DIAGNOSIS — D539 Nutritional anemia, unspecified: Secondary | ICD-10-CM

## 2024-03-03 DIAGNOSIS — I10 Essential (primary) hypertension: Secondary | ICD-10-CM

## 2024-03-03 DIAGNOSIS — E1169 Type 2 diabetes mellitus with other specified complication: Secondary | ICD-10-CM

## 2024-03-03 LAB — MICROALBUMIN / CREATININE URINE RATIO
Creatinine,U: 330.7 mg/dL
Microalb Creat Ratio: 2.5 mg/g (ref 0.0–30.0)
Microalb, Ur: 0.8 mg/dL (ref 0.0–1.9)

## 2024-03-03 LAB — COMPREHENSIVE METABOLIC PANEL WITH GFR
ALT: 16 U/L (ref 0–35)
AST: 21 U/L (ref 0–37)
Albumin: 3.8 g/dL (ref 3.5–5.2)
Alkaline Phosphatase: 220 U/L — ABNORMAL HIGH (ref 39–117)
BUN: 16 mg/dL (ref 6–23)
CO2: 27 meq/L (ref 19–32)
Calcium: 9.2 mg/dL (ref 8.4–10.5)
Chloride: 105 meq/L (ref 96–112)
Creatinine, Ser: 1 mg/dL (ref 0.40–1.20)
GFR: 62.73 mL/min (ref >60.00)
Glucose, Bld: 80 mg/dL (ref 70–99)
Potassium: 4 meq/L (ref 3.5–5.1)
Sodium: 140 meq/L (ref 135–145)
Total Bilirubin: 0.5 mg/dL (ref 0.2–1.2)
Total Protein: 7.2 g/dL (ref 6.0–8.3)

## 2024-03-03 LAB — LIPID PANEL
Cholesterol: 137 mg/dL (ref 0–200)
HDL: 54.3 mg/dL (ref >39.00)
LDL Cholesterol: 71 mg/dL (ref 0–99)
NonHDL: 82.93
Total CHOL/HDL Ratio: 3
Triglycerides: 60 mg/dL (ref 0.0–149.0)
VLDL: 12 mg/dL (ref 0.0–40.0)

## 2024-03-03 LAB — IRON: Iron: 63 ug/dL (ref 42–145)

## 2024-03-03 LAB — CBC
HCT: 36.7 % (ref 36.0–46.0)
Hemoglobin: 12 g/dL (ref 12.0–15.0)
MCHC: 32.7 g/dL (ref 30.0–36.0)
MCV: 80.2 fl (ref 78.0–100.0)
Platelets: 250 K/uL (ref 150.0–400.0)
RBC: 4.57 Mil/uL (ref 3.87–5.11)
RDW: 15.3 % (ref 11.5–15.5)
WBC: 6.4 K/uL (ref 4.0–10.5)

## 2024-03-03 LAB — FERRITIN: Ferritin: 161 ng/mL (ref 10.0–291.0)

## 2024-03-03 LAB — TSH: TSH: 2.54 u[IU]/mL (ref 0.35–5.50)

## 2024-03-06 ENCOUNTER — Ambulatory Visit: Payer: Self-pay | Admitting: Family Medicine

## 2024-03-06 ENCOUNTER — Encounter: Payer: Self-pay | Admitting: Family Medicine

## 2024-03-07 ENCOUNTER — Other Ambulatory Visit (INDEPENDENT_AMBULATORY_CARE_PROVIDER_SITE_OTHER): Payer: Self-pay | Admitting: Family Medicine

## 2024-03-07 DIAGNOSIS — E559 Vitamin D deficiency, unspecified: Secondary | ICD-10-CM

## 2024-03-21 ENCOUNTER — Encounter (HOSPITAL_COMMUNITY): Payer: Self-pay | Admitting: *Deleted

## 2024-03-28 ENCOUNTER — Other Ambulatory Visit (INDEPENDENT_AMBULATORY_CARE_PROVIDER_SITE_OTHER): Payer: Self-pay

## 2024-03-28 ENCOUNTER — Telehealth (INDEPENDENT_AMBULATORY_CARE_PROVIDER_SITE_OTHER): Payer: Self-pay | Admitting: Family Medicine

## 2024-03-28 ENCOUNTER — Other Ambulatory Visit (HOSPITAL_COMMUNITY): Payer: Self-pay

## 2024-03-28 DIAGNOSIS — E559 Vitamin D deficiency, unspecified: Secondary | ICD-10-CM

## 2024-03-28 MED ORDER — VITAMIN D (ERGOCALCIFEROL) 1.25 MG (50000 UNIT) PO CAPS
50000.0000 [IU] | ORAL_CAPSULE | ORAL | 0 refills | Status: DC
  Filled 2024-03-28: qty 8, 28d supply, fill #0

## 2024-03-28 NOTE — Telephone Encounter (Signed)
 Called pt to r/s appt since provider is out and she stated she seen Dr Pinkey last visit and did not get a vitamin D  medication refill. She is scheduled to f/u in January with Dr Midge can she get a refill on that Vitamin D  until she comes in at her January appt?

## 2024-03-29 ENCOUNTER — Ambulatory Visit (INDEPENDENT_AMBULATORY_CARE_PROVIDER_SITE_OTHER): Payer: Self-pay | Admitting: Family Medicine

## 2024-03-29 DIAGNOSIS — N6310 Unspecified lump in the right breast, unspecified quadrant: Secondary | ICD-10-CM | POA: Diagnosis not present

## 2024-04-01 ENCOUNTER — Other Ambulatory Visit (HOSPITAL_COMMUNITY): Payer: Self-pay

## 2024-04-27 ENCOUNTER — Other Ambulatory Visit (HOSPITAL_COMMUNITY): Payer: Self-pay

## 2024-04-27 ENCOUNTER — Encounter (INDEPENDENT_AMBULATORY_CARE_PROVIDER_SITE_OTHER): Payer: Self-pay | Admitting: Family Medicine

## 2024-04-27 ENCOUNTER — Ambulatory Visit (INDEPENDENT_AMBULATORY_CARE_PROVIDER_SITE_OTHER): Admitting: Family Medicine

## 2024-04-27 DIAGNOSIS — Z6841 Body Mass Index (BMI) 40.0 and over, adult: Secondary | ICD-10-CM | POA: Diagnosis not present

## 2024-04-27 DIAGNOSIS — E559 Vitamin D deficiency, unspecified: Secondary | ICD-10-CM

## 2024-04-27 DIAGNOSIS — Z7985 Long-term (current) use of injectable non-insulin antidiabetic drugs: Secondary | ICD-10-CM

## 2024-04-27 DIAGNOSIS — I10 Essential (primary) hypertension: Secondary | ICD-10-CM

## 2024-04-27 DIAGNOSIS — E1169 Type 2 diabetes mellitus with other specified complication: Secondary | ICD-10-CM | POA: Diagnosis not present

## 2024-04-27 MED ORDER — TIRZEPATIDE 5 MG/0.5ML ~~LOC~~ SOAJ
5.0000 mg | SUBCUTANEOUS | 0 refills | Status: AC
  Filled 2024-04-27: qty 2, 28d supply, fill #0

## 2024-04-27 MED ORDER — VITAMIN D (ERGOCALCIFEROL) 1.25 MG (50000 UNIT) PO CAPS
50000.0000 [IU] | ORAL_CAPSULE | ORAL | 0 refills | Status: AC
  Filled 2024-04-27: qty 8, 28d supply, fill #0

## 2024-04-27 NOTE — Progress Notes (Signed)
 "  Natalie DOROTHA [name], D.O.  ABFM, ABOM Specializing in Clinical Bariatric Medicine  Office located at: [address] [place], [place]  [address]      Orders Placed This Encounter  Procedures   VITAMIN D  25 Hydroxy (Vit-D Deficiency, Fractures)    Medications Discontinued During This Encounter  Medication Reason   tirzepatide  (MOUNJARO ) 5 MG/0.5ML Pen Reorder   Vitamin D , Ergocalciferol , (DRISDOL ) 1.25 MG (50000 UNIT) CAPS capsule Reorder     Meds ordered this encounter  Medications   tirzepatide  (MOUNJARO ) 5 MG/0.5ML Pen    Sig: Inject 5 mg into the skin once a week.    Dispense:  2 mL    Refill:  0    Decrease in dosage due to restrictive eating   Vitamin D , Ergocalciferol , (DRISDOL ) 1.25 MG (50000 UNIT) CAPS capsule    Sig: Take 1 capsule (50,000 Units total) by mouth 2 (two) times a week.    Dispense:  8 capsule    Refill:  0      A) FOR THE CHRONIC DISEASE OF OBESITY:  Chief complaint: Obesity [name] is here to discuss her progress with her obesity treatment plan.   History of present illness / Interval history:  [name] is here today for her follow-up office visit.  Since last OV on 02/24/24, pt is down 2 lbs.    02/24/24 14:00 04/27/24 13:00   Body Fat % 49.4 % 50.6 %  Muscle Mass (lbs) 109.2 lbs 105.6 lbs  Fat Mass (lbs) 112.4 lbs 114 lbs  Total Body Water (lbs) 83 lbs 87 lbs  Visceral Fat Rating  16 17    Counseling done on how various foods will affect these numbers and how to maximize success.  Total lbs lost to date: - 14 lbs Total Fat Mass in lbs lost to date: - 0.2 lbs Total weight loss percentage to date: - 5.86 %    Morbid obesity starting 45.16 BMI 40.0-44.9, adult (HCC) - Current BMI 42.54  Nutrition Therapy She is keeping a food journal and adhering to recommended goals of 1100-1200 calories and 80+ g of protein daily using the CAT 1 MP as a guide and states she is following her eating plan approximately 75 % of the time.    - Tracking Calories/Macros: no   - Eating More Whole Foods: yes  - Adequate Protein Intake: yes  - Adequate Water Intake: no   - Skipping Meals: yes  - Sleeping 7-9 Hours/ Night: yes   [name] is currently in the action stage of change. As such, her goal is to continue weight management plan.  She has agreed to: continue current plan   Physical Activity [name] is not exercising.   [name] has been advised to work up to 300-450 minutes of moderate intensity aerobic activity a week and strengthening exercises 2-3 times per week for cardiovascular health, weight loss maintenance and preservation of muscle mass.  She has agreed to : Think about enjoyable ways to increase daily physical activity and overcoming barriers to exercise and Increase physical activity in their day and reduce sedentary time (increase NEAT).   Behavioral Modifications Evidence-based interventions for health behavior change were utilized today including the discussion of   1) problem-solving barriers:    - Break protein into smaller portions throughout the day   2) SMART goals for next OV:    - Journal and focus on lean proteins   Regarding patient's less desirable eating habits and patterns, we employed  the technique of small changes.   We discussed the following today: increasing lean protein intake to established goals, avoiding skipping meals, reading food labels , keeping healthy foods at home, planning for success, and better snacking choices Additional resources provided today: None   Medical Interventions/ Pharmacotherapy Previous Bariatric surgery: s/p gastric sleeve Pharmacotherapy for weight loss: She is currently taking Mounjaro  5 mg weekly  for medical weight loss.    We discussed various medication options to help [name] with her weight loss efforts and we both agreed to : Adequate clinical response to anti-obesity medication, continue current anti-obesity regimen   B) OBESITY RELATED  CONDITIONS ADDRESSED TODAY:  Type 2 diabetes mellitus with other specified complication, without long-term current use of insulin  [facility]) Assessment & Plan Lab Results  Component Value Date   HGBA1C 5.8 (A) 02/21/2024   HGBA1C 6.2 (H) 11/11/2023   HGBA1C 5.7 08/13/2023   INSULIN  8.9 05/11/2023   INSULIN  CANCELED 07/12/2019    On Mounjaro  5 mg weekly with reported good compliance and tolerance. Patient was previously on the 7.5 mg but states that the dosage was too strong and dropped down to 5 mg at the last OV. She was not eating her food and was consistently tired. Since decreasing to the 5 mg she states that she has a better appetite and is drinking more water. She reports occasionally skipping dinner if she eats a big lunch. Stressed to patient the importance of getting all her lean proteins in to avoid losing muscle mass. Will refill today.     Essential hypertension, benign Assessment & Plan BP Readings from Last 3 Encounters:  04/27/24 127/77  02/24/24 107/72  02/21/24 108/78   The 10-year ASCVD risk score (Arnett DK, et al., 2019) is: 9.1%  Lab Results  Component Value Date   CREATININE 1.00 03/03/2024   On Norvasc  2.5 mg daily and stopped taking Lozol  since last OV. BP today is well controlled. Recommended that patient monitor her BP at home. If she is feeling lightheaded or dizzy she reach out to her PCP. Continue with medication and following prudent nutritional meal plan that is low in sodium.     Vitamin D  deficiency Assessment & Plan Lab Results  Component Value Date   VD25OH 24.9 (L) 11/11/2023   VD25OH 17.1 (L) 05/11/2023   VD25OH 28.19 (L) 01/31/2021   Taking ERGO 50K units twice weekly with reported good compliance and tolerance. Patient states that she has no issues remembering to take supplementation. Reminded patient the importance of having at goal Vit D levels. Will refill today. Will obtain labs today and review at next OV. Continue with supplementation.       Medications Discontinued During This Encounter  Medication Reason   tirzepatide  (MOUNJARO ) 5 MG/0.5ML Pen Reorder   Vitamin D , Ergocalciferol , (DRISDOL ) 1.25 MG (50000 UNIT) CAPS capsule Reorder     Meds ordered this encounter  Medications   tirzepatide  (MOUNJARO ) 5 MG/0.5ML Pen    Sig: Inject 5 mg into the skin once a week.    Dispense:  2 mL    Refill:  0    Decrease in dosage due to restrictive eating   Vitamin D , Ergocalciferol , (DRISDOL ) 1.25 MG (50000 UNIT) CAPS capsule    Sig: Take 1 capsule (50,000 Units total) by mouth 2 (two) times a week.    Dispense:  8 capsule    Refill:  0      Follow up:   Return 05/25/2024 at 3:40 PM  She was  informed of the importance of frequent follow up visits to maximize her success with intensive lifestyle modifications for her multiple health conditions.   Weight Summary and Biometrics   Weight Lost Since Last Visit: 2lb  Weight Gained Since Last Visit: 0lb   Vitals Temp: 98.1 F (36.7 C) BP: 127/77 Pulse Rate: 71 SpO2: 98 %   Anthropometric Measurements Height: 5' 1 (1.549 m) Weight: 225 lb (102.1 kg) BMI (Calculated): 42.54 Weight at Last Visit: 227lb Weight Lost Since Last Visit: 2lb Weight Gained Since Last Visit: 0lb Starting Weight: 239lb Total Weight Loss (lbs): 14 lb (6.35 kg)   Body Composition  Body Fat %: 50.6 % Fat Mass (lbs): 114 lbs Muscle Mass (lbs): 105.6 lbs Total Body Water (lbs): 87 lbs Visceral Fat Rating : 17   Other Clinical Data Fasting: no Labs: no Today's Visit #: 12 Starting Date: 05/11/23    Objective:   PHYSICAL EXAM: Blood pressure 127/77, pulse 71, temperature 98.1 F (36.7 C), height 5' 1 (1.549 m), weight 225 lb (102.1 kg), SpO2 98%. Body mass index is 42.51 kg/m.  General: she is overweight, cooperative and in no acute distress. PSYCH: Has normal mood, affect and thought process.   HEENT: EOMI, sclerae are anicteric. Lungs: Normal breathing effort, no  conversational dyspnea. Extremities: Moves * 4 Neurologic: A and O * 3, good insight  DIAGNOSTIC DATA REVIEWED: BMET    Component Value Date/Time   NA 140 03/03/2024 0813   NA 142 05/11/2023 0932   K 4.0 03/03/2024 0813   CL 105 03/03/2024 0813   CO2 27 03/03/2024 0813   GLUCOSE 80 03/03/2024 0813   BUN 16 03/03/2024 0813   BUN 20 05/11/2023 0932   CREATININE 1.00 03/03/2024 0813   CREATININE 1.10 (H) 08/02/2020 1601   CALCIUM  9.2 03/03/2024 0813   GFRNONAA >60 11/10/2022 0026   GFRNONAA 55 (L) 11/29/2019 0905   GFRAA 64 11/29/2019 0905   Lab Results  Component Value Date   HGBA1C 5.8 (A) 02/21/2024   HGBA1C  11/02/2006    6.0 (NOTE)   The ADA recommends the following therapeutic goals for glycemic   control related to Hgb A1C measurement:   Goal of Therapy:   < 7.0% Hgb A1C   Action Suggested:  > 8.0% Hgb A1C   Ref:  Diabetes Care, 22, Suppl. 1, 1999   Lab Results  Component Value Date   INSULIN  8.9 05/11/2023   INSULIN  CANCELED 07/12/2019   Lab Results  Component Value Date   TSH 2.54 03/03/2024   CBC    Component Value Date/Time   WBC 6.4 03/03/2024 0813   RBC 4.57 03/03/2024 0813   HGB 12.0 03/03/2024 0813   HGB 13.4 05/11/2023 0932   HCT 36.7 03/03/2024 0813   HCT 41.2 05/11/2023 0932   PLT 250.0 03/03/2024 0813   PLT 258 05/11/2023 0932   MCV 80.2 03/03/2024 0813   MCV 82 05/11/2023 0932   MCH 26.6 05/11/2023 0932   MCH 26.2 11/09/2022 0218   MCHC 32.7 03/03/2024 0813   RDW 15.3 03/03/2024 0813   RDW 14.4 05/11/2023 0932   Iron Studies    Component Value Date/Time   IRON 63 03/03/2024 0813   FERRITIN 161.0 03/03/2024 0813   IRONPCTSAT 23.2 05/15/2019 0750   Lipid Panel     Component Value Date/Time   CHOL 137 03/03/2024 0813   CHOL 203 (H) 05/11/2023 0932   TRIG 60.0 03/03/2024 0813   HDL 54.30 03/03/2024 0813   HDL  99 05/11/2023 0932   CHOLHDL 3 03/03/2024 0813   VLDL 12.0 03/03/2024 0813   LDLCALC 71 03/03/2024 0813   LDLCALC 92  05/11/2023 0932   Hepatic Function Panel     Component Value Date/Time   PROT 7.2 03/03/2024 0813   PROT 7.3 05/11/2023 0932   ALBUMIN 3.8 03/03/2024 0813   ALBUMIN 4.3 05/11/2023 0932   AST 21 03/03/2024 0813   ALT 16 03/03/2024 0813   ALKPHOS 220 (H) 03/03/2024 0813   BILITOT 0.5 03/03/2024 0813   BILITOT 0.3 05/11/2023 0932   BILIDIR 0.0 02/12/2023 0857   IBILI 0.2 08/02/2020 1601      Component Value Date/Time   TSH 2.54 03/03/2024 0813   Nutritional Lab Results  Component Value Date   VD25OH 24.9 (L) 11/11/2023   VD25OH 17.1 (L) 05/11/2023   VD25OH 28.19 (L) 01/31/2021    Attestations:   [name], acting as a medical scribe for Natalie Jenkins, DO., have compiled all relevant documentation for today's office visit on behalf of Natalie Jenkins, DO, while in the presence of [employer], DO.  I have reviewed the above documentation for accuracy and completeness, and I agree with the above. [name], D.O.  The 21st Century Cures Act was signed into law in 2016 which includes the topic of electronic health records.  This provides immediate access to information in MyChart.  This includes consultation notes, operative notes, office notes, lab results and pathology reports.  If you have any questions about what you read please let us  know at your next visit so we can discuss your concerns and take corrective action if need be.  We are right here with you.  "

## 2024-04-28 LAB — VITAMIN D 25 HYDROXY (VIT D DEFICIENCY, FRACTURES): Vit D, 25-Hydroxy: 42.7 ng/mL (ref 30.0–100.0)

## 2024-05-09 ENCOUNTER — Other Ambulatory Visit: Payer: Self-pay

## 2024-05-09 DIAGNOSIS — N6001 Solitary cyst of right breast: Secondary | ICD-10-CM

## 2024-05-25 ENCOUNTER — Ambulatory Visit (INDEPENDENT_AMBULATORY_CARE_PROVIDER_SITE_OTHER): Admitting: Family Medicine

## 2024-06-22 ENCOUNTER — Ambulatory Visit (INDEPENDENT_AMBULATORY_CARE_PROVIDER_SITE_OTHER): Admitting: Family Medicine
